# Patient Record
Sex: Female | Born: 1970
Health system: Southern US, Community
[De-identification: ages and names within clinical notes are randomized; demographics above are authoritative.]

## PROBLEM LIST (undated history)

## (undated) DIAGNOSIS — E039 Hypothyroidism, unspecified: Secondary | ICD-10-CM

## (undated) DIAGNOSIS — F32A Depression, unspecified: Secondary | ICD-10-CM

## (undated) DIAGNOSIS — Z87442 Personal history of urinary calculi: Secondary | ICD-10-CM

## (undated) DIAGNOSIS — E559 Vitamin D deficiency, unspecified: Secondary | ICD-10-CM

## (undated) DIAGNOSIS — R197 Diarrhea, unspecified: Secondary | ICD-10-CM

## (undated) DIAGNOSIS — N189 Chronic kidney disease, unspecified: Secondary | ICD-10-CM

## (undated) DIAGNOSIS — N200 Calculus of kidney: Secondary | ICD-10-CM

## (undated) DIAGNOSIS — E669 Obesity, unspecified: Secondary | ICD-10-CM

## (undated) DIAGNOSIS — N289 Disorder of kidney and ureter, unspecified: Secondary | ICD-10-CM

## (undated) DIAGNOSIS — I10 Essential (primary) hypertension: Secondary | ICD-10-CM

## (undated) DIAGNOSIS — F329 Major depressive disorder, single episode, unspecified: Secondary | ICD-10-CM

## (undated) DIAGNOSIS — F419 Anxiety disorder, unspecified: Secondary | ICD-10-CM

## (undated) HISTORY — PX: KIDNEY STONE SURGERY: SHX686

## (undated) HISTORY — DX: Diarrhea, unspecified: R19.7

## (undated) HISTORY — DX: Morbid (severe) obesity due to excess calories: E66.01

## (undated) HISTORY — PX: CHOLECYSTECTOMY: SHX55

## (undated) HISTORY — DX: Depression, unspecified: F32.A

## (undated) HISTORY — PX: OTHER SURGICAL HISTORY: SHX169

## (undated) HISTORY — DX: Chronic kidney disease, unspecified: N18.9

## (undated) HISTORY — DX: Anxiety disorder, unspecified: F41.9

---
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
## 2001-03-30 ENCOUNTER — Inpatient Hospital Stay (HOSPITAL_COMMUNITY): Admission: EM | Admit: 2001-03-30 | Discharge: 2001-04-02 | Payer: Self-pay | Admitting: Psychiatry

## 2001-04-05 ENCOUNTER — Other Ambulatory Visit (HOSPITAL_COMMUNITY): Admission: RE | Admit: 2001-04-05 | Discharge: 2001-04-06 | Payer: Self-pay | Admitting: *Deleted

## 2003-07-13 ENCOUNTER — Emergency Department (HOSPITAL_COMMUNITY): Admission: EM | Admit: 2003-07-13 | Discharge: 2003-07-13 | Payer: Self-pay | Admitting: Emergency Medicine

## 2004-05-20 ENCOUNTER — Inpatient Hospital Stay (HOSPITAL_COMMUNITY): Admission: RE | Admit: 2004-05-20 | Discharge: 2004-05-22 | Payer: Self-pay | Admitting: Urology

## 2004-05-30 ENCOUNTER — Ambulatory Visit (HOSPITAL_COMMUNITY): Admission: RE | Admit: 2004-05-30 | Discharge: 2004-05-30 | Payer: Self-pay | Admitting: Urology

## 2004-08-11 ENCOUNTER — Ambulatory Visit: Payer: Self-pay | Admitting: Psychiatry

## 2004-08-11 ENCOUNTER — Inpatient Hospital Stay (HOSPITAL_COMMUNITY): Admission: EM | Admit: 2004-08-11 | Discharge: 2004-08-12 | Payer: Self-pay | Admitting: Psychiatry

## 2009-01-13 ENCOUNTER — Emergency Department (HOSPITAL_COMMUNITY): Admission: EM | Admit: 2009-01-13 | Discharge: 2009-01-13 | Payer: Self-pay | Admitting: Emergency Medicine

## 2009-01-17 ENCOUNTER — Emergency Department (HOSPITAL_COMMUNITY): Admission: EM | Admit: 2009-01-17 | Discharge: 2009-01-17 | Payer: Self-pay | Admitting: Emergency Medicine

## 2009-02-01 ENCOUNTER — Ambulatory Visit (HOSPITAL_COMMUNITY): Admission: RE | Admit: 2009-02-01 | Discharge: 2009-02-01 | Payer: Self-pay | Admitting: Urology

## 2009-03-19 ENCOUNTER — Emergency Department (HOSPITAL_COMMUNITY): Admission: EM | Admit: 2009-03-19 | Discharge: 2009-03-19 | Payer: Self-pay | Admitting: Emergency Medicine

## 2009-08-06 ENCOUNTER — Encounter: Payer: Self-pay | Admitting: Gastroenterology

## 2009-08-23 ENCOUNTER — Ambulatory Visit (HOSPITAL_COMMUNITY): Admission: RE | Admit: 2009-08-23 | Discharge: 2009-08-23 | Payer: Self-pay | Admitting: Gastroenterology

## 2009-08-23 ENCOUNTER — Ambulatory Visit: Payer: Self-pay | Admitting: Gastroenterology

## 2010-06-18 ENCOUNTER — Ambulatory Visit (HOSPITAL_COMMUNITY)
Admission: RE | Admit: 2010-06-18 | Discharge: 2010-06-18 | Payer: Self-pay | Source: Home / Self Care | Attending: Urology | Admitting: Urology

## 2010-07-02 NOTE — Letter (Signed)
Summary: TRIAGE ORDER  TRIAGE ORDER   Imported By: Ave Filter 08/06/2009 10:57:06  _____________________________________________________________________  External Attachment:    Type:   Image     Comment:   External Document  Appended Document: TRIAGE ORDER ENDO W/ PROPOFOL DUE TO MULTIPLE PSYCH MEDS.  Appended Document: TRIAGE ORDER Instructions placed in the mail.Marland KitchenMarland KitchenOrder faxed to ENDO.

## 2010-08-25 LAB — HEPATIC FUNCTION PANEL
ALT: 14 U/L (ref 0–35)
AST: 16 U/L (ref 0–37)
Albumin: 3.9 g/dL (ref 3.5–5.2)
Alkaline Phosphatase: 67 U/L (ref 39–117)
Bilirubin, Direct: 0.1 mg/dL (ref 0.0–0.3)
Indirect Bilirubin: 0.1 mg/dL — ABNORMAL LOW (ref 0.3–0.9)
Total Bilirubin: 0.2 mg/dL — ABNORMAL LOW (ref 0.3–1.2)
Total Protein: 6.3 g/dL (ref 6.0–8.3)

## 2010-08-25 LAB — FECAL LACTOFERRIN, QUANT: Fecal Lactoferrin: NEGATIVE

## 2010-08-25 LAB — BASIC METABOLIC PANEL
BUN: 13 mg/dL (ref 6–23)
CO2: 22 mEq/L (ref 19–32)
Calcium: 9 mg/dL (ref 8.4–10.5)
Chloride: 112 mEq/L (ref 96–112)
Creatinine, Ser: 0.81 mg/dL (ref 0.4–1.2)
GFR calc Af Amer: >60 mL/min (ref >60)
GFR calc non Af Amer: >60 mL/min (ref >60)
Glucose, Bld: 81 mg/dL (ref 70–99)
Potassium: 3.8 mEq/L (ref 3.5–5.1)
Sodium: 139 mEq/L (ref 135–145)

## 2010-08-25 LAB — CLOSTRIDIUM DIFFICILE EIA: C difficile Toxins A+B, EIA: NEGATIVE

## 2010-08-25 LAB — GIARDIA/CRYPTOSPORIDIUM SCREEN(EIA)
Cryptosporidium Screen (EIA): NEGATIVE
Giardia Screen - EIA: NEGATIVE

## 2010-08-25 LAB — HCG, QUANTITATIVE, PREGNANCY: hCG, Beta Chain, Quant, S: <2 m[IU]/mL (ref <5)

## 2010-08-25 LAB — HEMOGLOBIN AND HEMATOCRIT, BLOOD
HCT: 37.2 % (ref 36.0–46.0)
Hemoglobin: 12.9 g/dL (ref 12.0–15.0)

## 2010-08-25 LAB — LIPASE, BLOOD: Lipase: 39 U/L (ref 11–59)

## 2010-09-07 LAB — URINALYSIS, ROUTINE W REFLEX MICROSCOPIC
Bilirubin Urine: NEGATIVE
Bilirubin Urine: NEGATIVE
Glucose, UA: NEGATIVE mg/dL
Ketones, ur: NEGATIVE mg/dL
Leukocytes, UA: NEGATIVE
Nitrite: NEGATIVE
Nitrite: NEGATIVE
Protein, ur: 100 mg/dL — AB
Specific Gravity, Urine: >1.03 — ABNORMAL HIGH (ref 1.005–1.030)
Specific Gravity, Urine: <1.005 — ABNORMAL LOW (ref 1.005–1.030)
Urobilinogen, UA: 0.2 mg/dL (ref 0.0–1.0)
Urobilinogen, UA: 0.2 mg/dL (ref 0.0–1.0)
pH: 5.5 (ref 5.0–8.0)

## 2010-09-07 LAB — CBC
HCT: 40.1 % (ref 36.0–46.0)
HCT: 39 % (ref 36.0–46.0)
Hemoglobin: 13.9 g/dL (ref 12.0–15.0)
Hemoglobin: 13.6 g/dL (ref 12.0–15.0)
MCHC: 34.7 g/dL (ref 30.0–36.0)
MCHC: 35 g/dL (ref 30.0–36.0)
MCV: 89.4 fL (ref 78.0–100.0)
MCV: 89.3 fL (ref 78.0–100.0)
Platelets: 283 10*3/uL (ref 150–400)
RBC: 4.49 MIL/uL (ref 3.87–5.11)
RBC: 4.37 MIL/uL (ref 3.87–5.11)
RDW: 13.7 % (ref 11.5–15.5)
RDW: 13.6 % (ref 11.5–15.5)
WBC: 8.4 10*3/uL (ref 4.0–10.5)

## 2010-09-07 LAB — BASIC METABOLIC PANEL
BUN: 8 mg/dL (ref 6–23)
CO2: 24 mEq/L (ref 19–32)
CO2: 21 mEq/L (ref 19–32)
Calcium: 9.8 mg/dL (ref 8.4–10.5)
Calcium: 8.9 mg/dL (ref 8.4–10.5)
Chloride: 109 mEq/L (ref 96–112)
Chloride: 111 mEq/L (ref 96–112)
Creatinine, Ser: 1.12 mg/dL (ref 0.4–1.2)
GFR calc Af Amer: >60 mL/min (ref >60)
GFR calc Af Amer: >60 mL/min (ref >60)
GFR calc non Af Amer: 55 mL/min — ABNORMAL LOW (ref >60)
Glucose, Bld: 90 mg/dL (ref 70–99)
Glucose, Bld: 107 mg/dL — ABNORMAL HIGH (ref 70–99)
Potassium: 3.8 mEq/L (ref 3.5–5.1)
Potassium: 2.8 mEq/L — ABNORMAL LOW (ref 3.5–5.1)
Sodium: 139 mEq/L (ref 135–145)
Sodium: 136 mEq/L (ref 135–145)

## 2010-09-07 LAB — COMPREHENSIVE METABOLIC PANEL
ALT: 17 U/L (ref 0–35)
AST: 19 U/L (ref 0–37)
Albumin: 4.1 g/dL (ref 3.5–5.2)
Alkaline Phosphatase: 90 U/L (ref 39–117)
BUN: 10 mg/dL (ref 6–23)
CO2: 26 mEq/L (ref 19–32)
Calcium: 9.2 mg/dL (ref 8.4–10.5)
Chloride: 111 mEq/L (ref 96–112)
Creatinine, Ser: 1.28 mg/dL — ABNORMAL HIGH (ref 0.4–1.2)
GFR calc Af Amer: 57 mL/min — ABNORMAL LOW (ref >60)
GFR calc non Af Amer: 47 mL/min — ABNORMAL LOW (ref >60)
Glucose, Bld: 79 mg/dL (ref 70–99)
Potassium: 2.9 mEq/L — ABNORMAL LOW (ref 3.5–5.1)
Sodium: 141 mEq/L (ref 135–145)
Total Bilirubin: 0.3 mg/dL (ref 0.3–1.2)
Total Protein: 7 g/dL (ref 6.0–8.3)

## 2010-09-07 LAB — URINE MICROSCOPIC-ADD ON

## 2010-09-07 LAB — DIFFERENTIAL
Basophils Absolute: 0 10*3/uL (ref 0.0–0.1)
Basophils Absolute: 0 10*3/uL (ref 0.0–0.1)
Basophils Relative: 1 % (ref 0–1)
Basophils Relative: 1 % (ref 0–1)
Eosinophils Absolute: 0.4 10*3/uL (ref 0.0–0.7)
Eosinophils Absolute: 0.1 10*3/uL (ref 0.0–0.7)
Eosinophils Relative: 4 % (ref 0–5)
Eosinophils Relative: 1 % (ref 0–5)
Lymphocytes Relative: 44 % (ref 12–46)
Lymphs Abs: 3.7 10*3/uL (ref 0.7–4.0)
Monocytes Absolute: 0.7 10*3/uL (ref 0.1–1.0)
Monocytes Absolute: 0.6 10*3/uL (ref 0.1–1.0)
Monocytes Relative: 9 % (ref 3–12)
Monocytes Relative: 8 % (ref 3–12)
Neutro Abs: 3.6 10*3/uL (ref 1.7–7.7)
Neutrophils Relative %: 43 % (ref 43–77)

## 2010-09-07 LAB — PREGNANCY, URINE: Preg Test, Ur: NEGATIVE

## 2010-09-07 LAB — HCG, QUANTITATIVE, PREGNANCY: hCG, Beta Chain, Quant, S: <2 m[IU]/mL (ref <5)

## 2010-09-07 LAB — LIPASE, BLOOD: Lipase: 22 U/L (ref 11–59)

## 2010-10-15 NOTE — Consult Note (Signed)
[name], [name]              ACCOUNT NO.:  [id number]   MEDICAL RECORD NO.:  [id number]          PATIENT TYPE:  AMB   LOCATION:  DAY                           FACILITY:  [facility]   PHYSICIAN:  Ky Barban, M.D.DATE OF BIRTH:  1970/08/16   DATE OF CONSULTATION:  DATE OF DISCHARGE:                                 CONSULTATION   CHIEF COMPLAINT:  Recurrent right renal colic.   HISTORY:  This 40 year old female is having recurrent right renal colic.  CT scan shows that she has at least 3 stones in the right ureter.  The  largest one is 6 mm.  They are at different level in the ureter causing  minimum obstruction also small bilateral renal calculi.  It should be  mentioned that she had right renal colic in 2005, at that time she had a  large stone in the right ureter, a double-J stent and ESL was done.  She  has not come back for followup in the past.  She has recurrent right  renal colic and CT scan shows multiple stones in the ureter, so I have  recommended cysto, right retrograde pyelogram, ureteroscopic stone  basket extraction, use of double-J stent.  I had a long discussion with  the patient and her mother.  I told them complication of this procedure.  1. I may not be able to remove the stone.  2. Possible ureteral perforation leading to open surgery.  She      understands, wants me to go ahead and proceed.   PAST MEDICAL HISTORY:  She had abortion in 2006, ESL in 2005 for right  ureteral calculus with double-J stent.  Also, suffers from depression,  takes medications, potassium, Prozac, and Topamax and Depo shorts for  birth control.   ALLERGIES:  Possible allergies for drugs SULFA drugs.   PERSONAL HISTORY:  Does not smoke or drink.   REVIEW OF SYSTEMS:  Unremarkable.   PHYSICAL EXAMINATION:  GENERAL:  Well-nourished, well-developed female  not in acute distress.  Blood pressure 143/91, temperature is normal.  CENTRAL NERVOUS SYSTEM:  Negative.  HEAD, NECK, EYES,  AND ENT:  Negative.  CHEST:  Symmetrical.  HEART:  Regular sinus rhythm.  No murmur.  ABDOMEN:  Soft, flat.  Liver, spleen, and kidneys are not palpable.  No  CVA tenderness.  PELVIC:  No adnexal mass or tenderness.   IMPRESSION:  Bilateral small renal calculi and right ureteral calculi.   PLAN:  Cystoscopy, right retrograde pyelogram, ureteroscopic stone  basket extraction, insertion of double-J stent under anesthesia as  outpatient.  Last year, she had left renal colic, went to [facility].  She passed a small calculus.      Ky Barban, M.D.  Electronically Signed     MIJ/MEDQ  D:  01/31/2009  T:  02/01/2009  Job:  [id number]

## 2010-10-15 NOTE — Op Note (Signed)
NAMENOEMIE, DEVIVO              ACCOUNT NO.:  [id number]   MEDICAL RECORD NO.:  [id number]          PATIENT TYPE:  AMB   LOCATION:  DAY                           FACILITY:  [facility]   PHYSICIAN:  Ky Barban, M.D.DATE OF BIRTH:  February 22, 1971   DATE OF PROCEDURE:  02/01/2009  DATE OF DISCHARGE:  02/01/2009                               OPERATIVE REPORT   PREOPERATIVE DIAGNOSIS:  Right ureteral calculi.   POSTOPERATIVE DIAGNOSIS:  Right ureteral calculi.   PROCEDURE:  Cystoscopy, right retrograde pyelogram, ureteroscopic stone  basket, holmium laser lithotripsy, insertion of double-J stent size 5-  French 24 cm, no strings attached.   ANESTHESIA:  General endotracheal.   PROCEDURE:  The patient under general endotracheal anesthesia in  lithotomy position.  After usual prep and drape, #25 cystoscope  introduced into the bladder, right ureteral orifice catheterized with a  wedge catheter.  Hypaque was injected.  I can see there is a filling  defect in the right ureterovesical junction that is the stone above that  ureter is moderately dilated, but I do not see any other obstruction or  any other filling defect.  As we have seen on CT scan, there were  additional small stones in the upper ureter, but I do not see on this  retrograde.  The guidewire was passed up into the renal pelvis and the  intramural ureter was dilated with #15 balloon.  Now ureteroscope over  the guidewire was introduced, went to the level of the stone.  Stone is  treated with holmium laser.  It was broken into smaller pieces.  Then  under direct vision, stone was engaged in the basket and removed  completely.  I reintroduced the ureteroscope over the guidewire went to  the upper ureter, I do not see any stone in the entire, rest of the  ureter looks fine, and so I decided to quit the procedure and cystoscope  was introduced over the guidewire and then a double-J stent size 5-  French 24 cm was introduced over  the guidewire.  It is positioned within  the renal pelvis and the bladder.  Guidewire was removed and nice loop  was obtained in the renal pelvis and the bladder.  All the instruments  were removed.  The patient left the operating room in satisfactory  condition.      Ky Barban, M.D.  Electronically Signed     MIJ/MEDQ  D:  02/01/2009  T:  02/02/2009  Job:  [id number]

## 2010-10-18 NOTE — Discharge Summary (Signed)
NAMEMERILEE, WIBLE              ACCOUNT NO.:  [id number]   MEDICAL RECORD NO.:  [id number]          PATIENT TYPE:  INP   LOCATION:  A312                          FACILITY:  [facility]   PHYSICIAN:  Ky Barban, M.D.DATE OF BIRTH:  10/07/1970   DATE OF ADMISSION:  05/20/2004  DATE OF DISCHARGE:  12/21/2005LH                                 DISCHARGE SUMMARY   A 40 year old female was seen in the office with severe pain in the right  flank. She was having this pain for about one week. Has been to the  emergency room at [facility]. CT scan shows 8-mm stone in the right  ureteropelvic junction causing hydronephrosis, perinephric stranding. She  also has a 4-mm stone in the left kidney without any obstruction. The  patient was having severe pain, so she was admitted in the hospital so that  I could insert double J stent and do ESL. She did pass a stone last year. No  other significant medical problem. She was taken to the operating room on  December 21. Underwent a right retrograde pyelogram, cystoscopy, and  insertion of double J stent. Postoperatively, she did fine. The next day,  she was treated with ESL. She was given the instructions that she has a  double J stent and in one week should contact the office. After looking at  the KUB, Dr. Rito Ehrlich will decided to take the stent out, because I am not  going to be in the office. She was also given a prescription for Percocet.   FINAL DIAGNOSIS:  Right renal calculus.   DISCHARGE CONDITION:  Improved.   DISCHARGE MEDICATIONS:  Percocet.      MIJ/MEDQ  D:  06/28/2004  T:  06/28/2004  Job:  [id number]

## 2010-10-18 NOTE — Discharge Summary (Signed)
[facility]  Patient:    MASSA, PE Visit Number: [id number] MRN: [id number]          Service Type: PSY Location: PIOP Attending Physician:  Denny Peon Dictated by:   Reymundo Poll Dub Mikes, M.D. Admit Date:  04/05/2001 Discharge Date: 04/06/2001                             Discharge Summary  CHIEF COMPLAINT AND PRESENT ILLNESS:  This was the first admission to [facility] for this 40 year old female, who reported to her primary care physician for treatment of depression and was referred as she had been increasingly more depressed for the past three weeks, initial insomnia, overall increasing sleep, sleeping 8-10 hours a night, increased anhedonia, weight loss, decreased concentration, unable to make decisions, racing suicidal thoughts of just wishing that she would not wake up.  Denies any active suicidal ideation.  Has some history of impulsive spending but finances are actually in good shape.  No history of mania.  PAST PSYCHIATRIC HISTORY:  Had never seen on an outpatient or inpatient basis. Has been treated with Prozac and Paxil by her primary physician.  ALCOHOL/DRUG HISTORY:  Occasional drink.  No history of abuse.  MEDICAL HISTORY:  Noncontributory.  MEDICATIONS:  She takes Zoloft 50 mg per day for two years, Topamax 25 mg twice a day, and Alesse oral contraceptive pill.  PHYSICAL EXAMINATION:  Performed and failed to show an active findings.  MENTAL STATUS EXAMINATION:  Upon admission revealed a casually dressed female. Alert, cooperative, constricted affect.  Speech was normal, relevant and spontaneous.  Mood depression.  Affect depression.  Thought process was logical, goal directed, no evidence of suicidal ideation.  No homicidal ideation.  No auditory or visual hallucinations.  Brightened mood with further discussion.  Mild sense of paranoia, feeling that people are watching her and talking about her.  Appears  to be transient.  ADMISSION DIAGNOSES: Axis I:    Major depression, recurrent. Axis II:   No diagnosis. Axis III:  Hypokalemia. Axis IV:   Moderate. Axis V:    Global Assessment of Functioning upon admission 40; highest Global            Assessment of Functioning in the last year 75.  HOSPITAL COURSE:  She was admitted and started intensive individual and group psychotherapy.  She was given some Seroquel at bedtime.  She was kept on the Zoloft, which was increased to 100 mg and she was started on Wellbutrin slowly at 150 mg in the morning.  As the hospitalization progressed, she felt better. She reported no side effects to the combination of Zoloft and Wellbutrin.  She was encouraged and motivated.  She denied any further suicidal or homicidal ideation.  She was willing to come to IOP for further stabilization.  A family session held with her husband and her parents was very positive.  They were very supportive of her, for which she was discharged to outpatient follow-up.  DISCHARGE DIAGNOSES: Axis I:    Major depression, recurrent. Axis II:   No diagnosis. Axis III:  Hypokalemia. Axis IV:   Moderate. Axis V:    Global Assessment of Functioning upon discharge 60.  DISCHARGE MEDICATIONS: 1. Zoloft 100 mg per day. 2. Wellbutrin SR 150 mg in the morning x 7 days, then increase to 1 twice a    day. 3. Seroquel 25 mg at bedtime. 4. Trazodone 50 mg at  bedtime for sleep.  FOLLOW-UP:  [facility] and then follow up with Dr. Milford Cage at the [facility]. Dictated by:   Reymundo Poll Dub Mikes, M.D. Attending Physician:  Denny Peon DD:  05/09/01 TD:  05/10/01 Job: [id number]

## 2010-10-18 NOTE — H&P (Signed)
NAMESAFIYA, [name]              ACCOUNT NO.:  [id number]   MEDICAL RECORD NO.:  [id number]          PATIENT TYPE:  AMB   LOCATION:  DAY                           FACILITY:  [facility]   PHYSICIAN:  Ky Barban, M.D.DATE OF BIRTH:  [birth year]   DATE OF ADMISSION:  DATE OF DISCHARGE:  LH                                HISTORY & PHYSICAL   CHIEF COMPLAINT:  Recurrent right renal colic.   HISTORY OF PRESENT ILLNESS:  A 40 year old female was seen in the office  today with severe pain in the right flank.  She told me that she is hurting  like this for the last one week.  She went to the emergency room on Friday  in [place] and had a CT scan that showed that it is 8 mm stone in the right  ureteropelvic junction, causing hydronephrosis with __________ stranding.  She also had several smaller stones in the lower pole calix on that side and  she has a 4 mm stone in the left kidney without any obstruction.  I have  advised the patient to undergo insertion of Double J stent followed with  ESL.  I have gone over the procedure, its limitations, and complications in  detail.  She understands.  Then later today she started to have more pain,  nausea, vomiting, so I decided to keep her in the hospital under observation  and tomorrow we will put a Double J stent and if she is doing okay, I will  discharge her after the stent placement and the next day she is scheduled to  have ESL for renal calculus.   PAST MEDICAL HISTORY:  In October of this year she was in the hospital at  [facility] with left renal colic.  She apparently passed a stone and has never  been seen in the office since.  She told me she has passed a stone in the  past and has been having this pain in the right flank on and off for awhile.  No other significant medical problems like diabetes or hypertension.   ALLERGIES:  Positive allergic history to SULFA DRUGS.   PERSONAL HISTORY:  She does not smoke or drink.   REVIEW OF  SYSTEMS:  Unremarkable.   PHYSICAL EXAMINATION:  VITAL SIGNS:  Blood pressure 133/86, temperature  97.8.  CENTRAL NERVOUS SYSTEM:  No gross neurological deficit.  HEENT/NECK:  Negative.  CHEST:  Symmetrical.  HEART:  Regular sinus rhythm.  ABDOMEN:  Soft, flat.  Liver, spleen, kidneys are not palpable.  BACK:  1+ right CVA tenderness.  PELVIC:  Exam deferred.  EXTREMITIES:  Normal.   IMPRESSION:  Right renal calculus with hydronephrosis.   PLAN:  Urine culture, IV fluids,    __________ analgesia, and insertion of  Double J stent on the right side tomorrow.      MIJ/MEDQ  D:  05/20/2004  T:  05/20/2004  Job:  [id number]   cc:   Fara Chute  [address] [place]  [place] [address]  Fax: [phone]

## 2010-10-18 NOTE — H&P (Signed)
[facility]  Patient:    [name], [name] Visit Number: [id number] MRN: [id number]          Service Type: PSY Location: 300 0301 02 Attending Physician:  Rachael Fee Dictated by:   Young Berry Scott, N.P. Admit Date:  03/30/2001                     Psychiatric Admission Assessment  DATE OF ASSESSMENT:  March 31, 2001 at 9:30 a.m.  IDENTIFYING INFORMATION:  This is a 40 year old married Caucasian female, who is a voluntary admission.  HISTORY OF PRESENT ILLNESS:  This patient reported to her primary care physician yesterday for treatment of depression symptoms and was referred by her primary care physician.  She reports a history of depression for the past four years with some seasonal increase in symptoms, usually in the fall of the year.  The patient reports now that she has had some increase in depression for the past three weeks with initial insomnia, overall increase in sleep, sleeping 8-10 hours a night, increasing anhedonia, weight loss of 10-15 pounds in the past month, decreased concentration and inability to make decisions. The patient states that she has had some transient suicidal thoughts of just wishing that she would not wake up but she denies any active suicidal planning or intent.  The patient does have some history of impulsive spending but finances are actually in good shape and no real history of mania.  The patient has no history of prior suicide attempts.  No homicidal ideation is evident. She denies any homicidal thoughts.  Denies any auditory or visual hallucinations.  She does describe some vague symptoms of paranoia and she feels that people are talking about her at work and are watching her.  PAST PSYCHIATRIC HISTORY:  None.  The patient has never been seen on an outpatient or inpatient basis.  This is her first admission to [facility].  The patient has been treated with Prozac and Paxil in the past by  her primary care physician with poor results.  SOCIAL HISTORY:  The patient graduated from high school.  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  She has been married once for the past four years. She has no children.  She does state that she has had some spending problems with some debt history but reports her finances now are in the best shape they have ever been in.  She cites no particular social stressors for her situation.  FAMILY HISTORY:  The patient denies any history of mental illness or substance abuse.  ALCOHOL/DRUG HISTORY:  The patient has an occasional drink.  Denies any history of drug use.  She is a nonsmoker.  MEDICAL HISTORY:  The patient is followed by Rogue Jury, a physicians assistant at [facility] in [place], [place].  Medical problems are none.  The patient is healthy.  The patient has no prior history of surgeries. The patient has two prior hospitalizations, one for a possible allergic reaction to sulfa eye drops in the summer of 2002 and one hospitalization in 1992 for renal calculi.  MEDICATIONS:  Zoloft 75 mg a day, which she has been taking now for approximately two years, Topamax 25 mg b.i.d. (patient has taken this for three weeks) and Alesse oral contraceptive pills.  ALLERGIES:  SULFA drugs.  POSITIVE PHYSICAL FINDINGS:  The patients PE is currently pending.  LABORATORY DATA:  CBC is currently within normal limits.  Metabolic panel shows a creatinine of 1.0 and a BUN of 11.  It is noted that her potassium is 2.6.  Her other electrolytes are within normal limits.  Her pregnancy test is pending as is a urine drug screen and a routine UA and a thyroid panel.  MENTAL STATUS EXAMINATION:  This is a casually and neatly dressed female who is fully alert and cooperative with a constricted affect.  Speech is normal and relevant and is spontaneous.  Mood is  mildly depressed.  Thought process is logical and goal directed.  She has no evidence of suicidal ideation at this time.  No homicidal ideation.  No auditory or visual hallucinations.  She does brighten a bit in mood with further discussion.  She does have some mild vague sense of paranoia, feeling that people are watching her and talking about her and this appears to be transient over the past couple of weeks. Cognitively, she is intact.  DIAGNOSES: Axis I:    Major depression, recurrent, moderate. Axis II:   Deferred. Axis III:  Hypokalemia. Axis IV:   Deferred. Axis V:    Current 45; past year 78.  PLAN:  Voluntarily admit the patient to stabilize her mood and the patient promises safety on the unit.  We will discontinue her Topamax at this time and have elected to increase her Zoloft to 100 mg a day.  We will start her on Seroquel 25 mg at h.s. since she has had some vague feelings of paranoia and is having some difficulty with insomnia and we will continue to allow her to have trazodone also 50 mg on a p.r.n. basis.  We will get some corroboration from her husband and her mother, who had some concerns about her safety, and we will consider referring her to Milford Cage, M.D. for psychiatric follow-up after discharge up in the [facility] and she is amenable to this.  ESTIMATED LENGTH OF STAY:  Two to four days. Dictated by:   Young Berry Scott, N.P. Attending Physician:  Rachael Fee DD:  03/31/01 TD:  03/31/01 Job: [id number]

## 2010-10-18 NOTE — Op Note (Signed)
NAMEKRYSTINE, [name]              ACCOUNT NO.:  [id number]   MEDICAL RECORD NO.:  [id number]          PATIENT TYPE:  AMB   LOCATION:  DAY                           FACILITY:  [facility]   PHYSICIAN:  Ky Barban, M.D.DATE OF BIRTH:  1970/10/27   DATE OF PROCEDURE:  05/22/2004  DATE OF DISCHARGE:                                 OPERATIVE REPORT   PREOPERATIVE DIAGNOSIS:  Right ureteral calculus and hydronephrosis.   POSTOPERATIVE DIAGNOSIS:  Right ureteral calculus and hydronephrosis.   PROCEDURES:  1.  Cystoscopy  2.  Insertion of double J stent on right side, size 5 [place], 24 cm.   ANESTHESIA:  General.   PROCEDURE:  The patient is given general endotracheal anesthesia, placed in  lithotomy position after the usual prep and drape.  A #25 cystoscope is  introduced into the bladder.  It is inspected, looks normal.  Right ureteral  orifice was catheterized with a guidewire and under fluoroscopic control it  went up into the renal pelvis and I can see the green guidewire curling up  in that area, and the stone is seen in the upper ureter.  Over the guidewire  a 5 [place], 24 cm double J stent is positioned within the renal pelvis and  the bladder.  The guidewire is removed.  Nice coil of the double J stent is  seen in the renal pelvis and the bladder.  All the instruments are removed.  The patient left the operating room in satisfactory condition.      MIJ/MEDQ  D:  05/21/2004  T:  05/22/2004  Job:  [id number]

## 2010-10-18 NOTE — Discharge Summary (Signed)
[name], [name] NO.:  [id number]   MEDICAL RECORD NO.:  [id number]          PATIENT TYPE:  IPS   LOCATION:  0304                          FACILITY:  [facility]   PHYSICIAN:  Geoffery Lyons, M.D.      DATE OF BIRTH:  June 26, 1970   DATE OF ADMISSION:  08/11/2004  DATE OF DISCHARGE:  08/12/2004                                 DISCHARGE SUMMARY   CHIEF COMPLAINT AND PRESENT ILLNESS:  This was the second admission to [facility] for this 40 year old married white female.  Her  husband took out petition papers.  He felt she was suicidal.  She was  depressed status post an abortion.  Seen at [facility] by Dr. Berton Mount.  She  was evaluated and findings were compatible with her having had an abortion  around two weeks prior to this admission.  She was started on Effexor a  couple of weeks prior to this admission.  She decided to get an abortion  because she was not getting along with the husband.  They had financial  issues.  She became upset.  She pretended having some pills in her mouth but  did not take them.   PAST PSYCHIATRIC HISTORY:  One prior admission in April 05, 2001 to  April 06, 2001.  She was, at that time, taking Zoloft 50 mg per day and  Topamax 25 mg twice a day.  She has been on Prozac and Paxil.  She had  followed up with Dr. Donell Beers in February of 2005.   ALCOHOL/DRUG HISTORY:  Denies the use or abuse of any substances.   MEDICAL HISTORY:  Kidney stones.   MEDICATIONS:  Effexor XR 75 mg daily.   PHYSICAL EXAMINATION:  Performed and failed to show any acute findings.   LABORATORY DATA:  CBC within normal limits.  Drug screen negative for  substances of abuse.  Blood chemistry within normal limits.   MENTAL STATUS EXAM:  Alert, cooperative female.  Speech was normal rate,  rhythm and tone.  Mood was irritable.  Affect was congruent.  She said she  just wanted to sleep.  Thought processes were clear, rational and goal-  oriented.   Claimed that she would like some help.  Denied any suicidal or  homicidal ideation.  There was no evidence of delusions.  Cognition was well-  preserved.   ADMISSION DIAGNOSES:   AXIS I:  Major depression, recurrent.   AXIS II:  No diagnosis.   AXIS III:  1.  Status post renal calculi.  2.  Status post abortion.   AXIS IV:  Moderate.   AXIS V:  Global Assessment of Functioning upon admission 25-30; highest  Global Assessment of Functioning in the last year 60-65.   HOSPITAL COURSE:  She was admitted and started in individual and group  psychotherapy.  She was given Ambien for sleep.  She was switched to  Cymbalta 30 mg per day and she was also given Lamictal 25 mg daily.  She was  given Vistaril 50 mg.  She endorsed multiple losses.  Endorsed increased  depression.  Endorsed migraines were not going away.  Difficult time coping  from day to day.  Recently went through an abortion.  There was issues of  loss of relationship, conflict with the husband, acute pain.  She was  started on Cymbalta.  On the same day, she was informed that she did not  have any psychiatric benefits in her insurance and she had been sponsored by  [facility] for two days but not for [employer].  They authorized sponsorship for [facility].  Due to the same, she  was transferred to [facility] for further stabilization.   DISCHARGE DIAGNOSES:   AXIS I:  Major depression, recurrent, versus bipolar, depressed.   AXIS II:  No diagnosis.   AXIS III:  1.  Status post renal calculi.  2.  Status post abortion.   AXIS IV:  Moderate.   AXIS V:  Global Assessment of Functioning upon discharge 45.   DISPOSITION:  Discharged to [facility] for further stabilization.      IL/MEDQ  D:  09/04/2004  T:  09/04/2004  Job:  [id number]

## 2010-10-18 NOTE — H&P (Signed)
[name], PINARD NO.:  [id number]   MEDICAL RECORD NO.:  [id number]          PATIENT TYPE:  IPS   LOCATION:  0306                          FACILITY:  [facility]   PHYSICIAN:  Jeanice Lim, M.D. DATE OF BIRTH:  11/06/1970   DATE OF ADMISSION:  08/11/2004  DATE OF DISCHARGE:                         PSYCHIATRIC ADMISSION ASSESSMENT   IDENTIFYING INFORMATION:  This is an involuntary admission to the services  of Dr. Aleatha Borer.   HISTORY OF PRESENT ILLNESS:  This is a 40 year old married white female.  Apparently her husband took out the petition papers.  He felt that she was  suicidal.  She was depressed.  She was status post an abortion.  She was  seen at [facility] by Dr. Kate Sable.  Dr. Kate Sable checked and  apparently she did have an abortion about 2 weeks ago.  She had blood in her  urine.  She is known to have kidney stones.  They checked her HCG, and that  was coming down.  It was consistent with having had the abortion 2 weeks  ago.  She stated that she had been started on Effexor a couple of weeks ago.  I also found out that she was pregnant and had an abortion in the last week  or so.  The patient states that the reason she decided to have an abortion  is that she is not getting along with her husband at the moment, and they  are having financial issues.  Last night she became upset.  She put 10  Unisom pills in her mouth but did not take them.  Then she was taken to the  hospital at the request of her husband.   PAST PSYCHIATRIC HISTORY:  She states that she has had one prior admission  that was here; the dates on that are 11/04 to 04/06/01.  At that time she  was felt to have major depression, recurrent.  She was at that time taking  Zoloft 50 mg a day for 2 years and Topamax 25 mg twice a day.  She also  stated that she had been treated in the past with Prozac and Paxil by her  primary physician.  She was discharged on Zoloft 100 mg a day  and Wellbutrin  150 mg a day to be increased to twice a day, and Seroquel 25 mg at bedtime  back in 2002.  She stated that she had follow up with Dr. Donell Beers until  February 2005.  She was married this past May and has been on no  medications.  A mood disorder questionnaire was administered; it is  suggestive for underlying mood fluctuations and toward that end we will  address her medications in light of her past history and her presentation  today.   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  This is her second marriage.  She has no children.  She states she  was [redacted] weeks pregnant at the time that she terminated the pregnancy.   FAMILY HISTORY:  She states her mom has mood  problems.   ALCOHOL OR DRUG HISTORY:  She denies.   PRIMARY CARE Ammie Warrick:  [facility] in [place].   PAST MEDICAL HISTORY:  This past December she was admitted for kidney  stones.  She underwent lithotripsy and had a stent.  The date of that  admission was 12/19 to 05/22/2004.  She recently restarted Effexor 75 mg  p.o. daily.   ALLERGIES:  She states she has allergies to SULFA.   PHYSICAL EXAMINATION:  Showed she had some blood in her urine.   MENTAL STATUS EXAM:  She is alert and oriented.  Her speech is a normal  rate, rhythm and tone.  Her mood is irritable.  Her affect is congruent.  She states she just wants to go to sleep.  Her thought processes are clear,  rational and goal-oriented.  She would like help.  Judgment and insight were  intact.  Concentration and memory are intact.  Intelligence is at least  average.  She ill not answer whether she is suicidal or homicidal at this  time.  She denies ever having had auditory or visual hallucinations.   ADMISSION DIAGNOSES:   AXIS I:  1.  Depressive disorder, recurrent, severe.  2.  Rule out bipolar depressed.   AXIS II:  Deferred.   AXIS III:  1.  Status post renal calculi, lithotripsy and  stent placement.  2.  Recent abortion.   AXIS IV:  Severe, marital conflict and financial issues.   AXIS V:  30.   PLAN:  Admit for safety and stabilization, to restart psychotropics and what  we can do is start Cymbalta.  The patient states that she has had some  response to Effexor in the past.  Cymbalta will reach therapeutic levels of  norepinephrine and serotonin with 1 dose as well as opposed to having to  build up the dose of Effexor.  We will also start some Lamictal 25 mg at  h.s.      MD/MEDQ  D:  08/11/2004  T:  08/11/2004  Job:  [id number]

## 2010-10-18 NOTE — H&P (Signed)
[facility]  Patient:    [name], [name] Visit Number: [id number] MRN: [id number]          Service Type: PSY Location: 300 0301 02 Attending Physician:  Rachael Fee Dictated by:   Young Berry Scott, N.P. Admit Date:  03/30/2001                           History and Physical  DATE OF EXAMINATION: March 31, 2001 at approximately 2:15 p.m.  REVIEW OF SYSTEMS:  Essentially unremarkable.  The patient has no somatic complaints.  She has a past medical history of renal calculi.  She denies any prior history of seizures, denies any history of hypertension or cardiovascular problems.  No history of asthma.  The patient is a nonsmoker. Preventative care is up-to-date.  PHYSICAL EXAMINATION:  GENERAL:  The is a well-nourished, well-developed female who appears to be her stated age.  VITAL SIGNS:  Within normal limits today.  SKIN:  Pale in tone, no rashes, no distinguishing marking.  HEAD:  Hair is reddish-blonde and clipped short.  Hair is distribution is normal for age and sex.   Head is normocephalic, held midline.  EENT:  PERRLA.  Sclerae and conjunctivae are clear.  Fundi: Not visualized. EACs are patent.  No rhinorrhea.  Oropharynx is noninjected. NECK:  Supple without thyromegaly.  CARDIOVASCULAR:  S1 and S2 heard, no clicks, murmurs, or gallops.  Peripheral pulses are 2+/5.  Extremities are pink and warm.  No peripheral edema.  CHEST:  Symmetrical.  BREAST:  Deferred.  LUNGS:  Clear to auscultation.  ABDOMEN:  Flat, soft and quiet with no masses or tenderness.  GENITALIA:  Deferred.  MUSCULOSKELETAL:  Gait is normal.  Spine is straight.  Posture is upright. Strength is 5/5 throughout.  Independent in ADLs.  NEUROLOGIC:  Extraocular movements are intact with one beat of nystagmus in all directions.  Cranial nerves II-XII are intact.  Sensory is grossly intact. Motor movements are smooth without tremor.  Deep tendon reflexes are  2+/5 and are brisk.  Cerebellar function: Intact to heel-to-shin maneuvers.  Romberg: Without findings.  Babinski is equivocal. Dictated by:   Young Berry. Scott, N.P. Attending Physician:  Rachael Fee DD:  03/31/01 TD:  03/31/01 Job: [id number]

## 2011-06-09 DIAGNOSIS — J111 Influenza due to unidentified influenza virus with other respiratory manifestations: Secondary | ICD-10-CM | POA: Diagnosis not present

## 2011-07-27 DIAGNOSIS — S61209A Unspecified open wound of unspecified finger without damage to nail, initial encounter: Secondary | ICD-10-CM | POA: Diagnosis not present

## 2011-07-27 DIAGNOSIS — Z79899 Other long term (current) drug therapy: Secondary | ICD-10-CM | POA: Diagnosis not present

## 2011-07-27 DIAGNOSIS — F319 Bipolar disorder, unspecified: Secondary | ICD-10-CM | POA: Diagnosis not present

## 2011-10-22 DIAGNOSIS — F319 Bipolar disorder, unspecified: Secondary | ICD-10-CM | POA: Diagnosis not present

## 2011-10-23 DIAGNOSIS — Z3049 Encounter for surveillance of other contraceptives: Secondary | ICD-10-CM | POA: Diagnosis not present

## 2011-11-01 DIAGNOSIS — N209 Urinary calculus, unspecified: Secondary | ICD-10-CM | POA: Diagnosis not present

## 2011-11-01 DIAGNOSIS — N390 Urinary tract infection, site not specified: Secondary | ICD-10-CM | POA: Diagnosis not present

## 2011-11-01 DIAGNOSIS — R109 Unspecified abdominal pain: Secondary | ICD-10-CM | POA: Diagnosis not present

## 2011-11-01 DIAGNOSIS — R112 Nausea with vomiting, unspecified: Secondary | ICD-10-CM | POA: Diagnosis not present

## 2011-11-01 DIAGNOSIS — Z87442 Personal history of urinary calculi: Secondary | ICD-10-CM | POA: Diagnosis not present

## 2011-11-01 DIAGNOSIS — Z87440 Personal history of urinary (tract) infections: Secondary | ICD-10-CM | POA: Diagnosis not present

## 2011-11-01 DIAGNOSIS — M549 Dorsalgia, unspecified: Secondary | ICD-10-CM | POA: Diagnosis not present

## 2011-11-01 DIAGNOSIS — Z79899 Other long term (current) drug therapy: Secondary | ICD-10-CM | POA: Diagnosis not present

## 2011-11-06 DIAGNOSIS — N390 Urinary tract infection, site not specified: Secondary | ICD-10-CM | POA: Diagnosis not present

## 2011-11-06 DIAGNOSIS — Z79899 Other long term (current) drug therapy: Secondary | ICD-10-CM | POA: Diagnosis not present

## 2011-11-06 DIAGNOSIS — Z87442 Personal history of urinary calculi: Secondary | ICD-10-CM | POA: Diagnosis not present

## 2011-11-06 DIAGNOSIS — Z87440 Personal history of urinary (tract) infections: Secondary | ICD-10-CM | POA: Diagnosis not present

## 2011-11-06 DIAGNOSIS — N200 Calculus of kidney: Secondary | ICD-10-CM | POA: Diagnosis not present

## 2011-11-06 DIAGNOSIS — J069 Acute upper respiratory infection, unspecified: Secondary | ICD-10-CM | POA: Diagnosis not present

## 2011-11-19 DIAGNOSIS — N23 Unspecified renal colic: Secondary | ICD-10-CM | POA: Diagnosis not present

## 2011-11-19 DIAGNOSIS — N201 Calculus of ureter: Secondary | ICD-10-CM | POA: Diagnosis not present

## 2011-11-19 DIAGNOSIS — N269 Renal sclerosis, unspecified: Secondary | ICD-10-CM | POA: Diagnosis not present

## 2011-11-19 DIAGNOSIS — N200 Calculus of kidney: Secondary | ICD-10-CM | POA: Diagnosis not present

## 2011-11-19 DIAGNOSIS — N1330 Unspecified hydronephrosis: Secondary | ICD-10-CM | POA: Diagnosis not present

## 2011-11-20 DIAGNOSIS — N23 Unspecified renal colic: Secondary | ICD-10-CM | POA: Diagnosis not present

## 2011-11-20 DIAGNOSIS — N200 Calculus of kidney: Secondary | ICD-10-CM | POA: Diagnosis not present

## 2011-11-20 DIAGNOSIS — N201 Calculus of ureter: Secondary | ICD-10-CM | POA: Diagnosis not present

## 2011-11-21 DIAGNOSIS — F319 Bipolar disorder, unspecified: Secondary | ICD-10-CM | POA: Diagnosis not present

## 2011-11-21 DIAGNOSIS — Z87442 Personal history of urinary calculi: Secondary | ICD-10-CM | POA: Diagnosis not present

## 2011-11-21 DIAGNOSIS — Z79899 Other long term (current) drug therapy: Secondary | ICD-10-CM | POA: Diagnosis not present

## 2011-11-21 DIAGNOSIS — N1330 Unspecified hydronephrosis: Secondary | ICD-10-CM | POA: Diagnosis not present

## 2011-11-21 DIAGNOSIS — N201 Calculus of ureter: Secondary | ICD-10-CM | POA: Diagnosis not present

## 2011-11-21 DIAGNOSIS — E871 Hypo-osmolality and hyponatremia: Secondary | ICD-10-CM | POA: Diagnosis not present

## 2011-11-21 DIAGNOSIS — E876 Hypokalemia: Secondary | ICD-10-CM | POA: Diagnosis not present

## 2011-11-21 DIAGNOSIS — N23 Unspecified renal colic: Secondary | ICD-10-CM | POA: Diagnosis not present

## 2011-11-21 DIAGNOSIS — D649 Anemia, unspecified: Secondary | ICD-10-CM | POA: Diagnosis not present

## 2011-11-21 DIAGNOSIS — N200 Calculus of kidney: Secondary | ICD-10-CM | POA: Diagnosis not present

## 2011-12-12 DIAGNOSIS — K589 Irritable bowel syndrome without diarrhea: Secondary | ICD-10-CM | POA: Diagnosis not present

## 2011-12-12 DIAGNOSIS — R634 Abnormal weight loss: Secondary | ICD-10-CM | POA: Diagnosis not present

## 2012-01-16 DIAGNOSIS — R197 Diarrhea, unspecified: Secondary | ICD-10-CM | POA: Diagnosis not present

## 2012-01-16 DIAGNOSIS — R634 Abnormal weight loss: Secondary | ICD-10-CM | POA: Diagnosis not present

## 2012-01-16 DIAGNOSIS — K589 Irritable bowel syndrome without diarrhea: Secondary | ICD-10-CM | POA: Diagnosis not present

## 2012-01-26 DIAGNOSIS — Z3049 Encounter for surveillance of other contraceptives: Secondary | ICD-10-CM | POA: Diagnosis not present

## 2012-02-09 DIAGNOSIS — H43399 Other vitreous opacities, unspecified eye: Secondary | ICD-10-CM | POA: Diagnosis not present

## 2012-02-09 DIAGNOSIS — H53129 Transient visual loss, unspecified eye: Secondary | ICD-10-CM | POA: Diagnosis not present

## 2012-02-19 DIAGNOSIS — Z79899 Other long term (current) drug therapy: Secondary | ICD-10-CM | POA: Diagnosis not present

## 2012-02-19 DIAGNOSIS — K589 Irritable bowel syndrome without diarrhea: Secondary | ICD-10-CM | POA: Diagnosis not present

## 2012-04-09 DIAGNOSIS — R300 Dysuria: Secondary | ICD-10-CM | POA: Diagnosis not present

## 2012-04-09 DIAGNOSIS — Z01419 Encounter for gynecological examination (general) (routine) without abnormal findings: Secondary | ICD-10-CM | POA: Diagnosis not present

## 2012-04-09 DIAGNOSIS — Z124 Encounter for screening for malignant neoplasm of cervix: Secondary | ICD-10-CM | POA: Diagnosis not present

## 2012-04-15 DIAGNOSIS — Z3049 Encounter for surveillance of other contraceptives: Secondary | ICD-10-CM | POA: Diagnosis not present

## 2012-04-20 DIAGNOSIS — F319 Bipolar disorder, unspecified: Secondary | ICD-10-CM | POA: Diagnosis not present

## 2012-05-14 DIAGNOSIS — Z1231 Encounter for screening mammogram for malignant neoplasm of breast: Secondary | ICD-10-CM | POA: Diagnosis not present

## 2012-06-30 DIAGNOSIS — J019 Acute sinusitis, unspecified: Secondary | ICD-10-CM | POA: Diagnosis not present

## 2012-06-30 DIAGNOSIS — R5381 Other malaise: Secondary | ICD-10-CM | POA: Diagnosis not present

## 2012-07-08 DIAGNOSIS — Z3049 Encounter for surveillance of other contraceptives: Secondary | ICD-10-CM | POA: Diagnosis not present

## 2012-07-13 DIAGNOSIS — F319 Bipolar disorder, unspecified: Secondary | ICD-10-CM | POA: Diagnosis not present

## 2012-09-29 DIAGNOSIS — R5381 Other malaise: Secondary | ICD-10-CM | POA: Diagnosis not present

## 2012-09-29 DIAGNOSIS — Z3049 Encounter for surveillance of other contraceptives: Secondary | ICD-10-CM | POA: Diagnosis not present

## 2012-10-11 DIAGNOSIS — F319 Bipolar disorder, unspecified: Secondary | ICD-10-CM | POA: Diagnosis not present

## 2012-12-09 DIAGNOSIS — R5381 Other malaise: Secondary | ICD-10-CM | POA: Diagnosis not present

## 2012-12-09 DIAGNOSIS — R197 Diarrhea, unspecified: Secondary | ICD-10-CM | POA: Diagnosis not present

## 2012-12-09 DIAGNOSIS — R209 Unspecified disturbances of skin sensation: Secondary | ICD-10-CM | POA: Diagnosis not present

## 2012-12-09 DIAGNOSIS — R300 Dysuria: Secondary | ICD-10-CM | POA: Diagnosis not present

## 2012-12-09 DIAGNOSIS — Z87891 Personal history of nicotine dependence: Secondary | ICD-10-CM | POA: Diagnosis not present

## 2012-12-09 DIAGNOSIS — Z79899 Other long term (current) drug therapy: Secondary | ICD-10-CM | POA: Diagnosis not present

## 2012-12-09 DIAGNOSIS — Z87442 Personal history of urinary calculi: Secondary | ICD-10-CM | POA: Diagnosis not present

## 2012-12-09 DIAGNOSIS — R339 Retention of urine, unspecified: Secondary | ICD-10-CM | POA: Diagnosis not present

## 2012-12-09 DIAGNOSIS — R109 Unspecified abdominal pain: Secondary | ICD-10-CM | POA: Diagnosis not present

## 2012-12-09 DIAGNOSIS — S40269A Insect bite (nonvenomous) of unspecified shoulder, initial encounter: Secondary | ICD-10-CM | POA: Diagnosis not present

## 2013-02-08 DIAGNOSIS — F319 Bipolar disorder, unspecified: Secondary | ICD-10-CM | POA: Diagnosis not present

## 2013-02-08 DIAGNOSIS — N390 Urinary tract infection, site not specified: Secondary | ICD-10-CM | POA: Diagnosis not present

## 2013-02-08 DIAGNOSIS — N200 Calculus of kidney: Secondary | ICD-10-CM | POA: Diagnosis not present

## 2013-02-08 DIAGNOSIS — R109 Unspecified abdominal pain: Secondary | ICD-10-CM | POA: Diagnosis not present

## 2013-02-09 DIAGNOSIS — R109 Unspecified abdominal pain: Secondary | ICD-10-CM | POA: Diagnosis not present

## 2013-02-11 DIAGNOSIS — R300 Dysuria: Secondary | ICD-10-CM | POA: Diagnosis not present

## 2013-02-11 DIAGNOSIS — M545 Low back pain, unspecified: Secondary | ICD-10-CM | POA: Diagnosis not present

## 2013-03-03 DIAGNOSIS — M9981 Other biomechanical lesions of cervical region: Secondary | ICD-10-CM | POA: Diagnosis not present

## 2013-03-03 DIAGNOSIS — M4712 Other spondylosis with myelopathy, cervical region: Secondary | ICD-10-CM | POA: Diagnosis not present

## 2013-03-03 DIAGNOSIS — M999 Biomechanical lesion, unspecified: Secondary | ICD-10-CM | POA: Diagnosis not present

## 2013-03-03 DIAGNOSIS — M546 Pain in thoracic spine: Secondary | ICD-10-CM | POA: Diagnosis not present

## 2013-03-03 DIAGNOSIS — M531 Cervicobrachial syndrome: Secondary | ICD-10-CM | POA: Diagnosis not present

## 2013-03-03 DIAGNOSIS — S339XXA Sprain of unspecified parts of lumbar spine and pelvis, initial encounter: Secondary | ICD-10-CM | POA: Diagnosis not present

## 2013-03-08 DIAGNOSIS — M9981 Other biomechanical lesions of cervical region: Secondary | ICD-10-CM | POA: Diagnosis not present

## 2013-03-08 DIAGNOSIS — M546 Pain in thoracic spine: Secondary | ICD-10-CM | POA: Diagnosis not present

## 2013-03-08 DIAGNOSIS — M4712 Other spondylosis with myelopathy, cervical region: Secondary | ICD-10-CM | POA: Diagnosis not present

## 2013-03-08 DIAGNOSIS — S339XXA Sprain of unspecified parts of lumbar spine and pelvis, initial encounter: Secondary | ICD-10-CM | POA: Diagnosis not present

## 2013-03-08 DIAGNOSIS — M999 Biomechanical lesion, unspecified: Secondary | ICD-10-CM | POA: Diagnosis not present

## 2013-03-08 DIAGNOSIS — M531 Cervicobrachial syndrome: Secondary | ICD-10-CM | POA: Diagnosis not present

## 2013-03-11 DIAGNOSIS — M531 Cervicobrachial syndrome: Secondary | ICD-10-CM | POA: Diagnosis not present

## 2013-03-11 DIAGNOSIS — S339XXA Sprain of unspecified parts of lumbar spine and pelvis, initial encounter: Secondary | ICD-10-CM | POA: Diagnosis not present

## 2013-03-11 DIAGNOSIS — M546 Pain in thoracic spine: Secondary | ICD-10-CM | POA: Diagnosis not present

## 2013-03-11 DIAGNOSIS — M999 Biomechanical lesion, unspecified: Secondary | ICD-10-CM | POA: Diagnosis not present

## 2013-03-11 DIAGNOSIS — M9981 Other biomechanical lesions of cervical region: Secondary | ICD-10-CM | POA: Diagnosis not present

## 2013-03-11 DIAGNOSIS — M4712 Other spondylosis with myelopathy, cervical region: Secondary | ICD-10-CM | POA: Diagnosis not present

## 2013-05-03 DIAGNOSIS — F3161 Bipolar disorder, current episode mixed, mild: Secondary | ICD-10-CM | POA: Diagnosis not present

## 2013-05-04 DIAGNOSIS — Z309 Encounter for contraceptive management, unspecified: Secondary | ICD-10-CM | POA: Diagnosis not present

## 2013-05-04 DIAGNOSIS — Z01419 Encounter for gynecological examination (general) (routine) without abnormal findings: Secondary | ICD-10-CM | POA: Diagnosis not present

## 2013-05-04 DIAGNOSIS — Z6828 Body mass index (BMI) 28.0-28.9, adult: Secondary | ICD-10-CM | POA: Diagnosis not present

## 2013-05-04 DIAGNOSIS — I10 Essential (primary) hypertension: Secondary | ICD-10-CM | POA: Diagnosis not present

## 2013-05-16 DIAGNOSIS — Z1231 Encounter for screening mammogram for malignant neoplasm of breast: Secondary | ICD-10-CM | POA: Diagnosis not present

## 2013-05-23 DIAGNOSIS — F3161 Bipolar disorder, current episode mixed, mild: Secondary | ICD-10-CM | POA: Diagnosis not present

## 2013-05-30 DIAGNOSIS — I10 Essential (primary) hypertension: Secondary | ICD-10-CM | POA: Diagnosis not present

## 2013-05-30 DIAGNOSIS — Z6828 Body mass index (BMI) 28.0-28.9, adult: Secondary | ICD-10-CM | POA: Diagnosis not present

## 2013-05-31 DIAGNOSIS — F3161 Bipolar disorder, current episode mixed, mild: Secondary | ICD-10-CM | POA: Diagnosis not present

## 2013-06-27 DIAGNOSIS — I10 Essential (primary) hypertension: Secondary | ICD-10-CM | POA: Diagnosis not present

## 2013-06-27 DIAGNOSIS — Z6828 Body mass index (BMI) 28.0-28.9, adult: Secondary | ICD-10-CM | POA: Diagnosis not present

## 2013-07-04 DIAGNOSIS — H35369 Drusen (degenerative) of macula, unspecified eye: Secondary | ICD-10-CM | POA: Diagnosis not present

## 2013-07-11 DIAGNOSIS — F3161 Bipolar disorder, current episode mixed, mild: Secondary | ICD-10-CM | POA: Diagnosis not present

## 2013-07-25 DIAGNOSIS — Z6828 Body mass index (BMI) 28.0-28.9, adult: Secondary | ICD-10-CM | POA: Diagnosis not present

## 2013-07-25 DIAGNOSIS — Z309 Encounter for contraceptive management, unspecified: Secondary | ICD-10-CM | POA: Diagnosis not present

## 2013-08-01 DIAGNOSIS — G43919 Migraine, unspecified, intractable, without status migrainosus: Secondary | ICD-10-CM | POA: Diagnosis not present

## 2013-08-01 DIAGNOSIS — R112 Nausea with vomiting, unspecified: Secondary | ICD-10-CM | POA: Diagnosis not present

## 2013-08-01 DIAGNOSIS — R5383 Other fatigue: Secondary | ICD-10-CM | POA: Diagnosis not present

## 2013-08-01 DIAGNOSIS — R197 Diarrhea, unspecified: Secondary | ICD-10-CM | POA: Diagnosis not present

## 2013-08-01 DIAGNOSIS — R5381 Other malaise: Secondary | ICD-10-CM | POA: Diagnosis not present

## 2013-08-02 DIAGNOSIS — R918 Other nonspecific abnormal finding of lung field: Secondary | ICD-10-CM | POA: Diagnosis not present

## 2013-08-02 DIAGNOSIS — R197 Diarrhea, unspecified: Secondary | ICD-10-CM | POA: Diagnosis not present

## 2013-08-02 DIAGNOSIS — Z87440 Personal history of urinary (tract) infections: Secondary | ICD-10-CM | POA: Diagnosis not present

## 2013-08-02 DIAGNOSIS — Z79899 Other long term (current) drug therapy: Secondary | ICD-10-CM | POA: Diagnosis not present

## 2013-08-02 DIAGNOSIS — E860 Dehydration: Secondary | ICD-10-CM | POA: Diagnosis not present

## 2013-08-02 DIAGNOSIS — R112 Nausea with vomiting, unspecified: Secondary | ICD-10-CM | POA: Diagnosis not present

## 2013-08-02 DIAGNOSIS — Z87442 Personal history of urinary calculi: Secondary | ICD-10-CM | POA: Diagnosis not present

## 2013-08-02 DIAGNOSIS — F319 Bipolar disorder, unspecified: Secondary | ICD-10-CM | POA: Diagnosis not present

## 2013-08-02 DIAGNOSIS — R1110 Vomiting, unspecified: Secondary | ICD-10-CM | POA: Diagnosis not present

## 2013-09-28 DIAGNOSIS — Z87442 Personal history of urinary calculi: Secondary | ICD-10-CM | POA: Diagnosis not present

## 2013-09-28 DIAGNOSIS — N3000 Acute cystitis without hematuria: Secondary | ICD-10-CM | POA: Diagnosis not present

## 2013-10-03 DIAGNOSIS — F3161 Bipolar disorder, current episode mixed, mild: Secondary | ICD-10-CM | POA: Diagnosis not present

## 2013-10-03 DIAGNOSIS — F5000 Anorexia nervosa, unspecified: Secondary | ICD-10-CM | POA: Diagnosis not present

## 2013-11-17 DIAGNOSIS — W57XXXA Bitten or stung by nonvenomous insect and other nonvenomous arthropods, initial encounter: Secondary | ICD-10-CM | POA: Diagnosis not present

## 2013-11-17 DIAGNOSIS — T148 Other injury of unspecified body region: Secondary | ICD-10-CM | POA: Diagnosis not present

## 2013-11-17 DIAGNOSIS — J029 Acute pharyngitis, unspecified: Secondary | ICD-10-CM | POA: Diagnosis not present

## 2013-11-17 DIAGNOSIS — R197 Diarrhea, unspecified: Secondary | ICD-10-CM | POA: Diagnosis not present

## 2013-12-06 DIAGNOSIS — F319 Bipolar disorder, unspecified: Secondary | ICD-10-CM | POA: Diagnosis not present

## 2013-12-06 DIAGNOSIS — Z79899 Other long term (current) drug therapy: Secondary | ICD-10-CM | POA: Diagnosis not present

## 2013-12-06 DIAGNOSIS — I10 Essential (primary) hypertension: Secondary | ICD-10-CM | POA: Diagnosis not present

## 2013-12-06 DIAGNOSIS — E876 Hypokalemia: Secondary | ICD-10-CM | POA: Diagnosis not present

## 2013-12-06 DIAGNOSIS — R209 Unspecified disturbances of skin sensation: Secondary | ICD-10-CM | POA: Diagnosis not present

## 2013-12-06 DIAGNOSIS — R030 Elevated blood-pressure reading, without diagnosis of hypertension: Secondary | ICD-10-CM | POA: Diagnosis not present

## 2013-12-06 DIAGNOSIS — R110 Nausea: Secondary | ICD-10-CM | POA: Diagnosis not present

## 2013-12-08 DIAGNOSIS — E876 Hypokalemia: Secondary | ICD-10-CM | POA: Diagnosis not present

## 2013-12-09 DIAGNOSIS — R030 Elevated blood-pressure reading, without diagnosis of hypertension: Secondary | ICD-10-CM | POA: Diagnosis not present

## 2013-12-09 DIAGNOSIS — R209 Unspecified disturbances of skin sensation: Secondary | ICD-10-CM | POA: Diagnosis not present

## 2013-12-22 DIAGNOSIS — R5383 Other fatigue: Secondary | ICD-10-CM | POA: Diagnosis not present

## 2013-12-22 DIAGNOSIS — I10 Essential (primary) hypertension: Secondary | ICD-10-CM | POA: Diagnosis not present

## 2013-12-22 DIAGNOSIS — R5381 Other malaise: Secondary | ICD-10-CM | POA: Diagnosis not present

## 2013-12-22 DIAGNOSIS — E876 Hypokalemia: Secondary | ICD-10-CM | POA: Diagnosis not present

## 2014-01-02 DIAGNOSIS — F3161 Bipolar disorder, current episode mixed, mild: Secondary | ICD-10-CM | POA: Diagnosis not present

## 2014-01-04 DIAGNOSIS — Z3049 Encounter for surveillance of other contraceptives: Secondary | ICD-10-CM | POA: Diagnosis not present

## 2014-03-02 DIAGNOSIS — F3132 Bipolar disorder, current episode depressed, moderate: Secondary | ICD-10-CM | POA: Diagnosis not present

## 2014-03-02 DIAGNOSIS — F5000 Anorexia nervosa, unspecified: Secondary | ICD-10-CM | POA: Diagnosis not present

## 2014-03-28 DIAGNOSIS — Z308 Encounter for other contraceptive management: Secondary | ICD-10-CM | POA: Diagnosis not present

## 2014-04-13 DIAGNOSIS — R531 Weakness: Secondary | ICD-10-CM | POA: Diagnosis not present

## 2014-04-13 DIAGNOSIS — Z87442 Personal history of urinary calculi: Secondary | ICD-10-CM | POA: Diagnosis not present

## 2014-04-13 DIAGNOSIS — F319 Bipolar disorder, unspecified: Secondary | ICD-10-CM | POA: Diagnosis not present

## 2014-04-13 DIAGNOSIS — Z885 Allergy status to narcotic agent status: Secondary | ICD-10-CM | POA: Diagnosis not present

## 2014-04-13 DIAGNOSIS — R1032 Left lower quadrant pain: Secondary | ICD-10-CM | POA: Diagnosis not present

## 2014-04-13 DIAGNOSIS — E860 Dehydration: Secondary | ICD-10-CM | POA: Diagnosis not present

## 2014-04-13 DIAGNOSIS — Z9049 Acquired absence of other specified parts of digestive tract: Secondary | ICD-10-CM | POA: Diagnosis not present

## 2014-04-13 DIAGNOSIS — R319 Hematuria, unspecified: Secondary | ICD-10-CM | POA: Diagnosis not present

## 2014-04-13 DIAGNOSIS — Z79899 Other long term (current) drug therapy: Secondary | ICD-10-CM | POA: Diagnosis not present

## 2014-04-13 DIAGNOSIS — N200 Calculus of kidney: Secondary | ICD-10-CM | POA: Diagnosis not present

## 2014-04-13 DIAGNOSIS — F329 Major depressive disorder, single episode, unspecified: Secondary | ICD-10-CM | POA: Diagnosis not present

## 2014-04-13 DIAGNOSIS — R109 Unspecified abdominal pain: Secondary | ICD-10-CM | POA: Diagnosis not present

## 2014-04-13 DIAGNOSIS — Z87440 Personal history of urinary (tract) infections: Secondary | ICD-10-CM | POA: Diagnosis not present

## 2014-04-14 DIAGNOSIS — R319 Hematuria, unspecified: Secondary | ICD-10-CM | POA: Diagnosis not present

## 2014-04-14 DIAGNOSIS — N200 Calculus of kidney: Secondary | ICD-10-CM | POA: Diagnosis not present

## 2014-05-09 DIAGNOSIS — F3132 Bipolar disorder, current episode depressed, moderate: Secondary | ICD-10-CM | POA: Diagnosis not present

## 2014-05-10 DIAGNOSIS — Z01419 Encounter for gynecological examination (general) (routine) without abnormal findings: Secondary | ICD-10-CM | POA: Diagnosis not present

## 2014-05-30 DIAGNOSIS — Z1231 Encounter for screening mammogram for malignant neoplasm of breast: Secondary | ICD-10-CM | POA: Diagnosis not present

## 2014-06-22 DIAGNOSIS — Z308 Encounter for other contraceptive management: Secondary | ICD-10-CM | POA: Diagnosis not present

## 2014-06-27 DIAGNOSIS — F3131 Bipolar disorder, current episode depressed, mild: Secondary | ICD-10-CM | POA: Diagnosis not present

## 2014-07-27 DIAGNOSIS — G43009 Migraine without aura, not intractable, without status migrainosus: Secondary | ICD-10-CM | POA: Diagnosis not present

## 2014-07-27 DIAGNOSIS — R51 Headache: Secondary | ICD-10-CM | POA: Diagnosis not present

## 2014-07-27 DIAGNOSIS — R110 Nausea: Secondary | ICD-10-CM | POA: Diagnosis not present

## 2014-08-03 DIAGNOSIS — R739 Hyperglycemia, unspecified: Secondary | ICD-10-CM | POA: Diagnosis not present

## 2014-08-03 DIAGNOSIS — M79673 Pain in unspecified foot: Secondary | ICD-10-CM | POA: Diagnosis not present

## 2014-08-07 DIAGNOSIS — Z87440 Personal history of urinary (tract) infections: Secondary | ICD-10-CM | POA: Diagnosis not present

## 2014-08-07 DIAGNOSIS — M545 Low back pain: Secondary | ICD-10-CM | POA: Diagnosis not present

## 2014-08-07 DIAGNOSIS — M25562 Pain in left knee: Secondary | ICD-10-CM | POA: Diagnosis not present

## 2014-08-07 DIAGNOSIS — W108XXA Fall (on) (from) other stairs and steps, initial encounter: Secondary | ICD-10-CM | POA: Diagnosis not present

## 2014-08-07 DIAGNOSIS — S339XXA Sprain of unspecified parts of lumbar spine and pelvis, initial encounter: Secondary | ICD-10-CM | POA: Diagnosis not present

## 2014-08-07 DIAGNOSIS — F319 Bipolar disorder, unspecified: Secondary | ICD-10-CM | POA: Diagnosis not present

## 2014-08-07 DIAGNOSIS — S83422A Sprain of lateral collateral ligament of left knee, initial encounter: Secondary | ICD-10-CM | POA: Diagnosis not present

## 2014-08-07 DIAGNOSIS — Z87442 Personal history of urinary calculi: Secondary | ICD-10-CM | POA: Diagnosis not present

## 2014-08-07 DIAGNOSIS — S8992XA Unspecified injury of left lower leg, initial encounter: Secondary | ICD-10-CM | POA: Diagnosis not present

## 2014-08-07 DIAGNOSIS — M549 Dorsalgia, unspecified: Secondary | ICD-10-CM | POA: Diagnosis not present

## 2014-08-07 DIAGNOSIS — S3992XA Unspecified injury of lower back, initial encounter: Secondary | ICD-10-CM | POA: Diagnosis not present

## 2014-08-07 DIAGNOSIS — S335XXA Sprain of ligaments of lumbar spine, initial encounter: Secondary | ICD-10-CM | POA: Diagnosis not present

## 2014-08-07 DIAGNOSIS — N200 Calculus of kidney: Secondary | ICD-10-CM | POA: Diagnosis not present

## 2014-08-07 DIAGNOSIS — Z79899 Other long term (current) drug therapy: Secondary | ICD-10-CM | POA: Diagnosis not present

## 2014-08-29 DIAGNOSIS — M25471 Effusion, right ankle: Secondary | ICD-10-CM | POA: Diagnosis not present

## 2014-08-29 DIAGNOSIS — M25472 Effusion, left ankle: Secondary | ICD-10-CM | POA: Diagnosis not present

## 2014-08-29 DIAGNOSIS — F319 Bipolar disorder, unspecified: Secondary | ICD-10-CM | POA: Diagnosis not present

## 2014-08-29 DIAGNOSIS — Z79899 Other long term (current) drug therapy: Secondary | ICD-10-CM | POA: Diagnosis not present

## 2014-08-29 DIAGNOSIS — R600 Localized edema: Secondary | ICD-10-CM | POA: Diagnosis not present

## 2014-08-29 DIAGNOSIS — S99911A Unspecified injury of right ankle, initial encounter: Secondary | ICD-10-CM | POA: Diagnosis not present

## 2014-08-29 DIAGNOSIS — M25473 Effusion, unspecified ankle: Secondary | ICD-10-CM | POA: Diagnosis not present

## 2014-08-29 DIAGNOSIS — Z87442 Personal history of urinary calculi: Secondary | ICD-10-CM | POA: Diagnosis not present

## 2014-09-05 DIAGNOSIS — J0101 Acute recurrent maxillary sinusitis: Secondary | ICD-10-CM | POA: Diagnosis not present

## 2014-09-05 DIAGNOSIS — S86912A Strain of unspecified muscle(s) and tendon(s) at lower leg level, left leg, initial encounter: Secondary | ICD-10-CM | POA: Diagnosis not present

## 2014-09-07 ENCOUNTER — Emergency Department (HOSPITAL_COMMUNITY)
Admission: EM | Admit: 2014-09-07 | Discharge: 2014-09-07 | Disposition: A | Discharge status: Home / Self Care | Payer: Medicare Other | Attending: Emergency Medicine | Admitting: Emergency Medicine

## 2014-09-07 ENCOUNTER — Encounter (HOSPITAL_COMMUNITY): Payer: Self-pay | Admitting: Emergency Medicine

## 2014-09-07 ENCOUNTER — Emergency Department (HOSPITAL_COMMUNITY): Payer: Medicare Other

## 2014-09-07 DIAGNOSIS — I10 Essential (primary) hypertension: Secondary | ICD-10-CM | POA: Diagnosis not present

## 2014-09-07 DIAGNOSIS — Z79899 Other long term (current) drug therapy: Secondary | ICD-10-CM | POA: Diagnosis not present

## 2014-09-07 DIAGNOSIS — Z792 Long term (current) use of antibiotics: Secondary | ICD-10-CM | POA: Insufficient documentation

## 2014-09-07 DIAGNOSIS — R079 Chest pain, unspecified: Secondary | ICD-10-CM | POA: Diagnosis not present

## 2014-09-07 DIAGNOSIS — Z7722 Contact with and (suspected) exposure to environmental tobacco smoke (acute) (chronic): Secondary | ICD-10-CM | POA: Diagnosis not present

## 2014-09-07 DIAGNOSIS — J40 Bronchitis, not specified as acute or chronic: Secondary | ICD-10-CM | POA: Diagnosis not present

## 2014-09-07 DIAGNOSIS — R05 Cough: Secondary | ICD-10-CM | POA: Diagnosis not present

## 2014-09-07 DIAGNOSIS — Z87448 Personal history of other diseases of urinary system: Secondary | ICD-10-CM | POA: Insufficient documentation

## 2014-09-07 HISTORY — DX: Essential (primary) hypertension: I10

## 2014-09-07 HISTORY — DX: Disorder of kidney and ureter, unspecified: N28.9

## 2014-09-07 MED ORDER — ALBUTEROL SULFATE HFA 108 (90 BASE) MCG/ACT IN AERS
2.0000 | INHALATION_SPRAY | Freq: Once | RESPIRATORY_TRACT | Status: AC
  Administered 2014-09-07: 2 via RESPIRATORY_TRACT
  Filled 2014-09-07: qty 6.7

## 2014-09-07 MED ORDER — HYDROCOD POLST-CHLORPHEN POLST 10-8 MG/5ML PO LQCR
5.0000 mL | Freq: Once | ORAL | Status: AC
  Administered 2014-09-07: 5 mL via ORAL
  Filled 2014-09-07: qty 5

## 2014-09-07 MED ORDER — FLUCONAZOLE 200 MG PO TABS: 200.0000 mg | ORAL_TABLET | Freq: Once | ORAL | Status: AC

## 2014-09-07 MED ORDER — GUAIFENESIN-CODEINE 100-10 MG/5ML PO SYRP: 10.0000 mL | ORAL_SOLUTION | Freq: Three times a day (TID) | ORAL | Status: DC | PRN

## 2014-09-07 MED ORDER — GUAIFENESIN-CODEINE 100-10 MG/5ML PO SOLN
10.0000 mL | Freq: Once | ORAL | Status: AC
Start: 2014-09-07 — End: 2014-09-07
  Administered 2014-09-07: 10 mL via ORAL
  Filled 2014-09-07: qty 10

## 2014-09-07 NOTE — ED Notes (Signed)
Was seen at [facility] in [place], Tuesday for sinus infections and was treated steroid shot, antibiotics and peril.  Notice blood in sputum yesterday.  Denies any pain.

## 2014-09-10 NOTE — ED Provider Notes (Signed)
CSN: [id number]     Arrival date & time 09/07/14  1519 History   First MD Initiated Contact with Patient 09/07/14 1604     Chief Complaint  Patient presents with  . Hemoptysis     (Consider location/radiation/quality/duration/timing/severity/associated sxs/prior Treatment) HPI   [name] is a 44 y.o. female who presents to the Emergency Department complaining of continued cough , nasal congestion for nearly one week.  She states that she was seen by her PMD earlier this week and treated with a steroid shot, cough medication and antibiotic.  She comes to the ED complaining of bloody streaks in her sputum.  She reports noticing this intermittently for one day.  She also reports very persistent, harsh coughing that she can not control. She denies fever, chest or abdominal pain, shortness of breath or vomiting.  She also denies weight loss, night sweats, or recent trips out of the country.      Past Medical History  Diagnosis Date  . Hypertension   . Renal disorder    Past Surgical History  Procedure Laterality Date  . Cholecystectomy    . Kidney stones     Family History  Problem Relation Age of Onset  . Cancer Mother   . Heart failure Mother   . Cancer Father    History  Substance Use Topics  . Smoking status: Passive Smoke Exposure - Never Smoker  . Smokeless tobacco: Not on file  . Alcohol Use: No   OB History    No data available     Review of Systems  Constitutional: Negative for fever, chills and appetite change.  HENT: Positive for congestion. Negative for rhinorrhea, sinus pressure, sore throat and trouble swallowing.   Respiratory: Positive for cough. Negative for chest tightness, shortness of breath and wheezing.   Cardiovascular: Negative for chest pain.  Gastrointestinal: Negative for nausea, vomiting and abdominal pain.  Genitourinary: Negative for dysuria.  Musculoskeletal: Negative for arthralgias.  Skin: Negative for rash.  Neurological: Negative  for dizziness, weakness and numbness.  Hematological: Negative for adenopathy.  All other systems reviewed and are negative.     Allergies  Morphine and related; Sulfa antibiotics; and Tape  Home Medications   Prior to Admission medications   Medication Sig Start Date End Date Taking? Authorizing Provider  amoxicillin-clavulanate (AUGMENTIN) 875-125 MG per tablet Take 1 tablet by mouth 2 (two) times daily.   Yes Historical Provider, MD  benzonatate (TESSALON) 200 MG capsule Take 200 mg by mouth 3 (three) times daily as needed for cough.   Yes Historical Provider, MD  fluconazole (DIFLUCAN) 200 MG tablet Take 1 tablet (200 mg total) by mouth once. 09/07/14 09/14/14  Tammi Yadriel Kerrigan, PA-C  guaiFENesin-codeine (ROBITUSSIN AC) 100-10 MG/5ML syrup Take 10 mLs by mouth 3 (three) times daily as needed. 09/07/14   Tammi Menno Vanbergen, PA-C   BP 137/80 mmHg  Pulse 113  Temp(Src) 98.7 F (37.1 C) (Oral)  Resp 20  Ht 5\' 2"  (1.575 m)  Wt 170 lb (77.111 kg)  BMI 31.09 kg/m2  SpO2 94% Physical Exam  Constitutional: She is oriented to person, place, and time. She appears well-developed and well-nourished. No distress.  HENT:  Head: Normocephalic and atraumatic.  Right Ear: Tympanic membrane and ear canal normal.  Left Ear: Tympanic membrane and ear canal normal.  Mouth/Throat: Uvula is midline, oropharynx is clear and moist and mucous membranes are normal. No oropharyngeal exudate.  Eyes: EOM are normal. Pupils are equal, round, and reactive to light.  Neck: Normal range of motion, full passive range of motion without pain and phonation normal. Neck supple.  Cardiovascular: Normal rate, regular rhythm, normal heart sounds and intact distal pulses.   No murmur heard. Pulmonary/Chest: Effort normal. No stridor. No respiratory distress. She has no rales. She exhibits no tenderness.  Actively coughing during exam.  Coarse lungs sounds bilaterally.  No rales or wheezing.  Abdominal: Soft. She exhibits no  distension. There is no tenderness. There is no rebound and no guarding.  Musculoskeletal: She exhibits no edema.  Lymphadenopathy:    She has no cervical adenopathy.  Neurological: She is alert and oriented to person, place, and time. She exhibits normal muscle tone. Coordination normal.  Skin: Skin is warm and dry.  Nursing note and vitals reviewed.   ED Course  Procedures (including critical care time) Labs Review Labs Reviewed - No data to display  Imaging Review Dg Chest 2 View  09/07/2014   CLINICAL DATA:  Productive cough with hemoptysis and chest pain for 6 days  EXAM: CHEST  2 VIEW  COMPARISON:  None.  FINDINGS: The heart size and mediastinal contours are within normal limits. Both lungs are clear. The visualized skeletal structures are unremarkable except for mild thoracic degenerative change diffusely. Prior cholecystectomy noted.  IMPRESSION: No active cardiopulmonary disease.   Electronically Signed   By: Jerilynn Mages.  Shick M.D.   On: 09/07/2014 16:43      EKG Interpretation None      MDM   Final diagnoses:  Bronchitis    Pt is well appearing, non-toxic.  Currently taking augmentin prescribed by her PMD.  Advised to d/c the tessalon and I will rx robutussin AC, albuterol inhaler dispensed.    Cough finally improving after albuterol inhaler, tussionex and robitussin AC.  Pt is feeling better and appears stable for d/c.  Likely bronchitis, but TB and PE were considered.  Pt agrees to treatment plan, close PMD f/u and to return to ER for any worsening symptoms.    Patrice Paradise, PA-C 09/10/14 [place], DO 09/12/14 1820

## 2014-10-27 DIAGNOSIS — F3131 Bipolar disorder, current episode depressed, mild: Secondary | ICD-10-CM | POA: Diagnosis not present

## 2014-10-27 DIAGNOSIS — F5000 Anorexia nervosa, unspecified: Secondary | ICD-10-CM | POA: Diagnosis not present

## 2014-11-14 DIAGNOSIS — N951 Menopausal and female climacteric states: Secondary | ICD-10-CM | POA: Diagnosis not present

## 2014-11-14 DIAGNOSIS — I10 Essential (primary) hypertension: Secondary | ICD-10-CM | POA: Diagnosis not present

## 2014-12-22 DIAGNOSIS — I10 Essential (primary) hypertension: Secondary | ICD-10-CM | POA: Diagnosis not present

## 2014-12-22 DIAGNOSIS — Z885 Allergy status to narcotic agent status: Secondary | ICD-10-CM | POA: Diagnosis not present

## 2014-12-22 DIAGNOSIS — M47812 Spondylosis without myelopathy or radiculopathy, cervical region: Secondary | ICD-10-CM | POA: Diagnosis not present

## 2014-12-22 DIAGNOSIS — F319 Bipolar disorder, unspecified: Secondary | ICD-10-CM | POA: Diagnosis not present

## 2014-12-22 DIAGNOSIS — N179 Acute kidney failure, unspecified: Secondary | ICD-10-CM | POA: Diagnosis not present

## 2014-12-22 DIAGNOSIS — Z87442 Personal history of urinary calculi: Secondary | ICD-10-CM | POA: Diagnosis not present

## 2014-12-22 DIAGNOSIS — E876 Hypokalemia: Secondary | ICD-10-CM | POA: Diagnosis not present

## 2014-12-22 DIAGNOSIS — Z79899 Other long term (current) drug therapy: Secondary | ICD-10-CM | POA: Diagnosis not present

## 2014-12-22 DIAGNOSIS — G43909 Migraine, unspecified, not intractable, without status migrainosus: Secondary | ICD-10-CM | POA: Diagnosis not present

## 2014-12-22 DIAGNOSIS — E860 Dehydration: Secondary | ICD-10-CM | POA: Diagnosis not present

## 2014-12-22 DIAGNOSIS — R030 Elevated blood-pressure reading, without diagnosis of hypertension: Secondary | ICD-10-CM | POA: Diagnosis not present

## 2014-12-22 DIAGNOSIS — Z87440 Personal history of urinary (tract) infections: Secondary | ICD-10-CM | POA: Diagnosis not present

## 2014-12-22 DIAGNOSIS — Z6832 Body mass index (BMI) 32.0-32.9, adult: Secondary | ICD-10-CM | POA: Diagnosis not present

## 2014-12-22 DIAGNOSIS — Z882 Allergy status to sulfonamides status: Secondary | ICD-10-CM | POA: Diagnosis not present

## 2014-12-22 DIAGNOSIS — M545 Low back pain: Secondary | ICD-10-CM | POA: Diagnosis not present

## 2014-12-22 DIAGNOSIS — R55 Syncope and collapse: Secondary | ICD-10-CM | POA: Diagnosis not present

## 2014-12-23 DIAGNOSIS — R030 Elevated blood-pressure reading, without diagnosis of hypertension: Secondary | ICD-10-CM | POA: Diagnosis not present

## 2014-12-23 DIAGNOSIS — M545 Low back pain: Secondary | ICD-10-CM | POA: Diagnosis not present

## 2014-12-23 DIAGNOSIS — R55 Syncope and collapse: Secondary | ICD-10-CM | POA: Diagnosis not present

## 2014-12-24 DIAGNOSIS — I10 Essential (primary) hypertension: Secondary | ICD-10-CM | POA: Diagnosis not present

## 2015-01-24 DIAGNOSIS — F3131 Bipolar disorder, current episode depressed, mild: Secondary | ICD-10-CM | POA: Diagnosis not present

## 2015-02-12 DIAGNOSIS — I10 Essential (primary) hypertension: Secondary | ICD-10-CM | POA: Diagnosis not present

## 2015-02-12 DIAGNOSIS — E876 Hypokalemia: Secondary | ICD-10-CM | POA: Diagnosis not present

## 2015-02-12 DIAGNOSIS — N200 Calculus of kidney: Secondary | ICD-10-CM | POA: Diagnosis not present

## 2015-02-12 DIAGNOSIS — Z23 Encounter for immunization: Secondary | ICD-10-CM | POA: Diagnosis not present

## 2015-02-21 DIAGNOSIS — R319 Hematuria, unspecified: Secondary | ICD-10-CM | POA: Diagnosis not present

## 2015-02-21 DIAGNOSIS — R938 Abnormal findings on diagnostic imaging of other specified body structures: Secondary | ICD-10-CM | POA: Diagnosis not present

## 2015-02-21 DIAGNOSIS — R109 Unspecified abdominal pain: Secondary | ICD-10-CM | POA: Diagnosis not present

## 2015-02-22 DIAGNOSIS — F419 Anxiety disorder, unspecified: Secondary | ICD-10-CM | POA: Diagnosis not present

## 2015-02-22 DIAGNOSIS — Z882 Allergy status to sulfonamides status: Secondary | ICD-10-CM | POA: Diagnosis not present

## 2015-02-22 DIAGNOSIS — F319 Bipolar disorder, unspecified: Secondary | ICD-10-CM | POA: Diagnosis not present

## 2015-02-22 DIAGNOSIS — I10 Essential (primary) hypertension: Secondary | ICD-10-CM | POA: Diagnosis not present

## 2015-02-22 DIAGNOSIS — R3915 Urgency of urination: Secondary | ICD-10-CM | POA: Diagnosis not present

## 2015-02-22 DIAGNOSIS — Z885 Allergy status to narcotic agent status: Secondary | ICD-10-CM | POA: Diagnosis not present

## 2015-02-22 DIAGNOSIS — R351 Nocturia: Secondary | ICD-10-CM | POA: Diagnosis not present

## 2015-02-22 DIAGNOSIS — N201 Calculus of ureter: Secondary | ICD-10-CM | POA: Diagnosis not present

## 2015-02-22 DIAGNOSIS — R109 Unspecified abdominal pain: Secondary | ICD-10-CM | POA: Diagnosis not present

## 2015-02-22 DIAGNOSIS — F329 Major depressive disorder, single episode, unspecified: Secondary | ICD-10-CM | POA: Diagnosis not present

## 2015-02-22 DIAGNOSIS — Z79899 Other long term (current) drug therapy: Secondary | ICD-10-CM | POA: Diagnosis not present

## 2015-02-22 DIAGNOSIS — N132 Hydronephrosis with renal and ureteral calculous obstruction: Secondary | ICD-10-CM | POA: Diagnosis not present

## 2015-02-23 DIAGNOSIS — Z885 Allergy status to narcotic agent status: Secondary | ICD-10-CM | POA: Diagnosis not present

## 2015-02-23 DIAGNOSIS — Z466 Encounter for fitting and adjustment of urinary device: Secondary | ICD-10-CM | POA: Diagnosis not present

## 2015-02-23 DIAGNOSIS — N201 Calculus of ureter: Secondary | ICD-10-CM | POA: Diagnosis not present

## 2015-02-23 DIAGNOSIS — Z882 Allergy status to sulfonamides status: Secondary | ICD-10-CM | POA: Diagnosis not present

## 2015-02-23 DIAGNOSIS — N23 Unspecified renal colic: Secondary | ICD-10-CM | POA: Diagnosis not present

## 2015-02-23 DIAGNOSIS — Z79899 Other long term (current) drug therapy: Secondary | ICD-10-CM | POA: Diagnosis not present

## 2015-02-23 DIAGNOSIS — F419 Anxiety disorder, unspecified: Secondary | ICD-10-CM | POA: Diagnosis not present

## 2015-02-23 DIAGNOSIS — N132 Hydronephrosis with renal and ureteral calculous obstruction: Secondary | ICD-10-CM | POA: Diagnosis not present

## 2015-02-23 DIAGNOSIS — F329 Major depressive disorder, single episode, unspecified: Secondary | ICD-10-CM | POA: Diagnosis not present

## 2015-02-23 DIAGNOSIS — F319 Bipolar disorder, unspecified: Secondary | ICD-10-CM | POA: Diagnosis not present

## 2015-02-23 DIAGNOSIS — I10 Essential (primary) hypertension: Secondary | ICD-10-CM | POA: Diagnosis not present

## 2015-02-26 DIAGNOSIS — F319 Bipolar disorder, unspecified: Secondary | ICD-10-CM | POA: Diagnosis not present

## 2015-02-26 DIAGNOSIS — Z882 Allergy status to sulfonamides status: Secondary | ICD-10-CM | POA: Diagnosis not present

## 2015-02-26 DIAGNOSIS — N200 Calculus of kidney: Secondary | ICD-10-CM | POA: Diagnosis not present

## 2015-02-26 DIAGNOSIS — Z792 Long term (current) use of antibiotics: Secondary | ICD-10-CM | POA: Diagnosis not present

## 2015-02-26 DIAGNOSIS — R1013 Epigastric pain: Secondary | ICD-10-CM | POA: Diagnosis not present

## 2015-02-26 DIAGNOSIS — Z79891 Long term (current) use of opiate analgesic: Secondary | ICD-10-CM | POA: Diagnosis not present

## 2015-02-26 DIAGNOSIS — Z885 Allergy status to narcotic agent status: Secondary | ICD-10-CM | POA: Diagnosis not present

## 2015-02-26 DIAGNOSIS — R109 Unspecified abdominal pain: Secondary | ICD-10-CM | POA: Diagnosis not present

## 2015-02-26 DIAGNOSIS — I2510 Atherosclerotic heart disease of native coronary artery without angina pectoris: Secondary | ICD-10-CM | POA: Diagnosis not present

## 2015-02-26 DIAGNOSIS — T8384XA Pain from genitourinary prosthetic devices, implants and grafts, initial encounter: Secondary | ICD-10-CM | POA: Diagnosis not present

## 2015-02-26 DIAGNOSIS — N201 Calculus of ureter: Secondary | ICD-10-CM | POA: Diagnosis not present

## 2015-02-26 DIAGNOSIS — R1011 Right upper quadrant pain: Secondary | ICD-10-CM | POA: Diagnosis not present

## 2015-02-26 DIAGNOSIS — R1012 Left upper quadrant pain: Secondary | ICD-10-CM | POA: Diagnosis not present

## 2015-02-26 DIAGNOSIS — K5900 Constipation, unspecified: Secondary | ICD-10-CM | POA: Diagnosis not present

## 2015-02-26 DIAGNOSIS — Z809 Family history of malignant neoplasm, unspecified: Secondary | ICD-10-CM | POA: Diagnosis not present

## 2015-02-26 DIAGNOSIS — I10 Essential (primary) hypertension: Secondary | ICD-10-CM | POA: Diagnosis not present

## 2015-02-26 DIAGNOSIS — N23 Unspecified renal colic: Secondary | ICD-10-CM | POA: Diagnosis not present

## 2015-02-27 DIAGNOSIS — R109 Unspecified abdominal pain: Secondary | ICD-10-CM | POA: Diagnosis not present

## 2015-02-27 DIAGNOSIS — N201 Calculus of ureter: Secondary | ICD-10-CM | POA: Diagnosis not present

## 2015-02-27 DIAGNOSIS — N23 Unspecified renal colic: Secondary | ICD-10-CM | POA: Diagnosis not present

## 2015-02-28 DIAGNOSIS — R109 Unspecified abdominal pain: Secondary | ICD-10-CM | POA: Diagnosis not present

## 2015-02-28 DIAGNOSIS — N23 Unspecified renal colic: Secondary | ICD-10-CM | POA: Diagnosis not present

## 2015-02-28 DIAGNOSIS — N201 Calculus of ureter: Secondary | ICD-10-CM | POA: Diagnosis not present

## 2015-03-09 DIAGNOSIS — N201 Calculus of ureter: Secondary | ICD-10-CM | POA: Diagnosis not present

## 2015-03-09 DIAGNOSIS — Z960 Presence of urogenital implants: Secondary | ICD-10-CM | POA: Diagnosis not present

## 2015-04-09 DIAGNOSIS — J209 Acute bronchitis, unspecified: Secondary | ICD-10-CM | POA: Diagnosis not present

## 2015-04-20 ENCOUNTER — Emergency Department (HOSPITAL_COMMUNITY): Payer: Medicare Other

## 2015-04-20 ENCOUNTER — Emergency Department (HOSPITAL_COMMUNITY)
Admission: EM | Admit: 2015-04-20 | Discharge: 2015-04-20 | Disposition: A | Discharge status: Home / Self Care | Payer: Medicare Other | Attending: Emergency Medicine | Admitting: Emergency Medicine

## 2015-04-20 ENCOUNTER — Encounter (HOSPITAL_COMMUNITY): Payer: Self-pay

## 2015-04-20 DIAGNOSIS — J40 Bronchitis, not specified as acute or chronic: Secondary | ICD-10-CM | POA: Insufficient documentation

## 2015-04-20 DIAGNOSIS — R1012 Left upper quadrant pain: Secondary | ICD-10-CM | POA: Diagnosis not present

## 2015-04-20 DIAGNOSIS — N200 Calculus of kidney: Secondary | ICD-10-CM | POA: Diagnosis not present

## 2015-04-20 DIAGNOSIS — I10 Essential (primary) hypertension: Secondary | ICD-10-CM | POA: Insufficient documentation

## 2015-04-20 DIAGNOSIS — Z3202 Encounter for pregnancy test, result negative: Secondary | ICD-10-CM | POA: Insufficient documentation

## 2015-04-20 DIAGNOSIS — R079 Chest pain, unspecified: Secondary | ICD-10-CM | POA: Diagnosis present

## 2015-04-20 DIAGNOSIS — Z9049 Acquired absence of other specified parts of digestive tract: Secondary | ICD-10-CM | POA: Diagnosis not present

## 2015-04-20 DIAGNOSIS — Z79899 Other long term (current) drug therapy: Secondary | ICD-10-CM | POA: Insufficient documentation

## 2015-04-20 DIAGNOSIS — Z87448 Personal history of other diseases of urinary system: Secondary | ICD-10-CM | POA: Diagnosis not present

## 2015-04-20 DIAGNOSIS — R0781 Pleurodynia: Secondary | ICD-10-CM | POA: Diagnosis not present

## 2015-04-20 DIAGNOSIS — Z7722 Contact with and (suspected) exposure to environmental tobacco smoke (acute) (chronic): Secondary | ICD-10-CM | POA: Diagnosis not present

## 2015-04-20 LAB — COMPREHENSIVE METABOLIC PANEL
ALK PHOS: 134 U/L — AB (ref 38–126)
ALT: 20 U/L (ref 14–54)
ANION GAP: 7 (ref 5–15)
AST: 19 U/L (ref 15–41)
Albumin: 3.9 g/dL (ref 3.5–5.0)
BILIRUBIN TOTAL: 0.3 mg/dL (ref 0.3–1.2)
BUN: 20 mg/dL (ref 6–20)
CALCIUM: 8.7 mg/dL — AB (ref 8.9–10.3)
CO2: 25 mmol/L (ref 22–32)
CREATININE: 1.48 mg/dL — AB (ref 0.44–1.00)
Chloride: 108 mmol/L (ref 101–111)
GFR, EST AFRICAN AMERICAN: 49 mL/min — AB (ref >60)
GFR, EST NON AFRICAN AMERICAN: 42 mL/min — AB (ref >60)
Glucose, Bld: 84 mg/dL (ref 65–99)
Potassium: 2.9 mmol/L — ABNORMAL LOW (ref 3.5–5.1)
SODIUM: 140 mmol/L (ref 135–145)
TOTAL PROTEIN: 7.3 g/dL (ref 6.5–8.1)

## 2015-04-20 LAB — URINALYSIS, ROUTINE W REFLEX MICROSCOPIC
BILIRUBIN URINE: NEGATIVE
Glucose, UA: NEGATIVE mg/dL
KETONES UR: NEGATIVE mg/dL
LEUKOCYTES UA: NEGATIVE
NITRITE: NEGATIVE
PH: 7 (ref 5.0–8.0)
PROTEIN: NEGATIVE mg/dL
Specific Gravity, Urine: 1.015 (ref 1.005–1.030)

## 2015-04-20 LAB — CBC WITH DIFFERENTIAL/PLATELET
Basophils Absolute: 0.1 10*3/uL (ref 0.0–0.1)
Basophils Relative: 1 %
EOS ABS: 0.4 10*3/uL (ref 0.0–0.7)
Eosinophils Relative: 3 %
HCT: 38.5 % (ref 36.0–46.0)
HEMOGLOBIN: 12.6 g/dL (ref 12.0–15.0)
LYMPHS ABS: 4.3 10*3/uL — AB (ref 0.7–4.0)
LYMPHS PCT: 34 %
MCH: 29.3 pg (ref 26.0–34.0)
MCHC: 32.7 g/dL (ref 30.0–36.0)
MCV: 89.5 fL (ref 78.0–100.0)
MONOS PCT: 6 %
Monocytes Absolute: 0.8 10*3/uL (ref 0.1–1.0)
NEUTROS PCT: 56 %
Neutro Abs: 7.2 10*3/uL (ref 1.7–7.7)
Platelets: 299 10*3/uL (ref 150–400)
RBC: 4.3 MIL/uL (ref 3.87–5.11)
RDW: 13.8 % (ref 11.5–15.5)
WBC: 12.7 10*3/uL — AB (ref 4.0–10.5)

## 2015-04-20 LAB — URINE MICROSCOPIC-ADD ON

## 2015-04-20 LAB — PREGNANCY, URINE: Preg Test, Ur: NEGATIVE

## 2015-04-20 MED ORDER — OXYCODONE-ACETAMINOPHEN 5-325 MG PO TABS: 1.0000 | ORAL_TABLET | Freq: Four times a day (QID) | ORAL | Status: DC | PRN

## 2015-04-20 MED ORDER — ONDANSETRON HCL 4 MG/2ML IJ SOLN
4.0000 mg | Freq: Once | INTRAMUSCULAR | Status: AC
  Administered 2015-04-20: 4 mg via INTRAVENOUS
  Filled 2015-04-20: qty 2

## 2015-04-20 MED ORDER — ONDANSETRON 4 MG PO TBDP: ORAL_TABLET | ORAL | Status: DC

## 2015-04-20 MED ORDER — POTASSIUM CHLORIDE CRYS ER 20 MEQ PO TBCR
40.0000 meq | EXTENDED_RELEASE_TABLET | Freq: Once | ORAL | Status: AC
  Administered 2015-04-20: 40 meq via ORAL
  Filled 2015-04-20: qty 2

## 2015-04-20 MED ORDER — IOHEXOL 300 MG/ML  SOLN
100.0000 mL | Freq: Once | INTRAMUSCULAR | Status: AC | PRN
  Administered 2015-04-20: 100 mL via INTRAVENOUS

## 2015-04-20 MED ORDER — HYDROMORPHONE HCL 4 MG PO TABS: 4.0000 mg | ORAL_TABLET | ORAL | Status: DC | PRN

## 2015-04-20 MED ORDER — BARIUM SULFATE 2 % PO SUSP: 450.0000 mL | Freq: Once | ORAL | Status: DC

## 2015-04-20 MED ORDER — HYDROMORPHONE HCL 1 MG/ML IJ SOLN
1.0000 mg | Freq: Once | INTRAMUSCULAR | Status: AC
  Administered 2015-04-20: 1 mg via INTRAVENOUS
  Filled 2015-04-20: qty 1

## 2015-04-20 NOTE — Discharge Instructions (Signed)
Follow up with your family md next week. °

## 2015-04-20 NOTE — ED Notes (Signed)
Pt reports has had bronchitis for the past 3 weeks and has had pain in left ribs x 4 days.  Reports area sore to touch.  Pt also says had lithotripsy and stone extraction surgery in sept.

## 2015-04-20 NOTE — ED Provider Notes (Signed)
CSN: [id number]     Arrival date & time 04/20/15  1833 History   First MD Initiated Contact with Patient 04/20/15 1854     Chief Complaint  Patient presents with  . rib pain      (Consider location/radiation/quality/duration/timing/severity/associated sxs/prior Treatment) Patient is a 44 y.o. female presenting with abdominal pain. The history is provided by the patient (Patient states that she's had a cough for weeks with yellow sputum production. That has improved some recently. She was put on Z-Pak which she has finished. She complains of left-sided chest pain and left upper quadrant abdominal pain).  Abdominal Pain Pain location: Left upper quadrant. Pain quality: aching   Pain radiates to:  Does not radiate Pain severity:  Moderate Onset quality:  Gradual Timing:  Constant Progression:  Waxing and waning Chronicity:  New Associated symptoms: no chest pain, no cough, no diarrhea, no fatigue and no hematuria     Past Medical History  Diagnosis Date  . Hypertension   . Renal disorder    Past Surgical History  Procedure Laterality Date  . Cholecystectomy    . Kidney stones     Family History  Problem Relation Age of Onset  . Cancer Mother   . Heart failure Mother   . Cancer Father    Social History  Substance Use Topics  . Smoking status: Passive Smoke Exposure - Never Smoker  . Smokeless tobacco: None  . Alcohol Use: No   OB History    No data available     Review of Systems  Constitutional: Negative for appetite change and fatigue.  HENT: Negative for congestion, ear discharge and sinus pressure.   Eyes: Negative for discharge.  Respiratory: Negative for cough.   Cardiovascular: Negative for chest pain.  Gastrointestinal: Positive for abdominal pain. Negative for diarrhea.  Genitourinary: Negative for frequency and hematuria.  Musculoskeletal: Negative for back pain.  Skin: Negative for rash.  Neurological: Negative for seizures and headaches.   Psychiatric/Behavioral: Negative for hallucinations.      Allergies  Morphine and related; Sulfa antibiotics; and Tape  Home Medications   Prior to Admission medications   Medication Sig Start Date End Date Taking? Authorizing Provider  albuterol (PROVENTIL HFA;VENTOLIN HFA) 108 (90 BASE) MCG/ACT inhaler Inhale 1-2 puffs into the lungs every 6 (six) hours as needed for wheezing or shortness of breath.   Yes Historical Provider, MD  ALPRAZolam Duanne Moron) 1 MG tablet Take 1 mg by mouth 3 (three) times daily as needed for anxiety.   Yes Historical Provider, MD  amLODipine (NORVASC) 5 MG tablet Take 10 mg by mouth daily.   Yes Historical Provider, MD  cloNIDine (CATAPRES) 0.1 MG tablet Take 0.2 mg by mouth 2 (two) times daily.   Yes Historical Provider, MD  desvenlafaxine (PRISTIQ) 50 MG 24 hr tablet Take 50 mg by mouth daily.   Yes Historical Provider, MD  doxepin (SINEQUAN) 75 MG capsule Take 75 mg by mouth at bedtime.   Yes Historical Provider, MD  lamoTRIgine (LAMICTAL) 100 MG tablet Take 100 mg by mouth 2 (two) times daily.   Yes Historical Provider, MD  Multiple Vitamin (MULTIVITAMIN WITH MINERALS) TABS tablet Take 1 tablet by mouth daily.   Yes Historical Provider, MD  QUEtiapine (SEROQUEL XR) 300 MG 24 hr tablet Take 300 mg by mouth at bedtime.   Yes Historical Provider, MD  topiramate (TOPAMAX) 50 MG tablet Take 50-100 mg by mouth 2 (two) times daily. 2 tablets in the morning and 1 tablet at  bedtime   Yes Historical Provider, MD  azithromycin (ZITHROMAX) 250 MG tablet Take 250-500 mg by mouth See admin instructions. 04/09/15   Historical Provider, MD  HYDROmorphone (DILAUDID) 4 MG tablet Take 1 tablet (4 mg total) by mouth every 4 (four) hours as needed for severe pain. 04/20/15   Milton Ferguson, MD  ondansetron (ZOFRAN ODT) 4 MG disintegrating tablet 4mg  ODT q4 hours prn nausea/vomit 04/20/15   Milton Ferguson, MD   BP 148/94 mmHg  Pulse 106  Temp(Src) 98 F (36.7 C) (Oral)  Resp 20   Ht 5\' 2"  (1.575 m)  Wt 175 lb (79.379 kg)  BMI 32.00 kg/m2  SpO2 97%  LMP  Physical Exam  Constitutional: She is oriented to person, place, and time. She appears well-developed.  HENT:  Head: Normocephalic.  Eyes: Conjunctivae and EOM are normal. No scleral icterus.  Neck: Neck supple. No thyromegaly present.  Cardiovascular: Normal rate and regular rhythm.  Exam reveals no gallop and no friction rub.   No murmur heard. Pulmonary/Chest: No stridor. She has no wheezes. She has no rales. She exhibits tenderness.  Tender left lateral ribs  Abdominal: She exhibits no distension. There is tenderness. There is no rebound.  Tender left upper quadrant  Musculoskeletal: Normal range of motion. She exhibits no edema.  Lymphadenopathy:    She has no cervical adenopathy.  Neurological: She is oriented to person, place, and time. She exhibits normal muscle tone. Coordination normal.  Skin: No rash noted. No erythema.  Psychiatric: She has a normal mood and affect. Her behavior is normal.    ED Course  Procedures (including critical care time) Labs Review Labs Reviewed  CBC WITH DIFFERENTIAL/PLATELET - Abnormal; Notable for the following:    WBC 12.7 (*)    Lymphs Abs 4.3 (*)    All other components within normal limits  COMPREHENSIVE METABOLIC PANEL - Abnormal; Notable for the following:    Potassium 2.9 (*)    Creatinine, Ser 1.48 (*)    Calcium 8.7 (*)    Alkaline Phosphatase 134 (*)    GFR calc non Af Amer 42 (*)    GFR calc Af Amer 49 (*)    All other components within normal limits  URINALYSIS, ROUTINE W REFLEX MICROSCOPIC (NOT AT [facility]) - Abnormal; Notable for the following:    Hgb urine dipstick SMALL (*)    All other components within normal limits  URINE MICROSCOPIC-ADD ON - Abnormal; Notable for the following:    Squamous Epithelial / LPF 6-30 (*)    Bacteria, UA MANY (*)    All other components within normal limits  PREGNANCY, URINE    Imaging Review Dg Ribs  Unilateral W/chest Left  04/20/2015  CLINICAL DATA:  Cough.  Left rib pain for 3 days. EXAM: LEFT RIBS AND CHEST - 3+ VIEW COMPARISON:  12/23/2014 FINDINGS: No fracture or other bone lesions are seen involving the ribs. There is no evidence of pneumothorax or pleural effusion. Both lungs are clear. Heart size and mediastinal contours are within normal limits. IMPRESSION: Negative. Electronically Signed   By: Earle Gell M.D.   On: 04/20/2015 19:10   Ct Abdomen Pelvis W Contrast  04/20/2015  CLINICAL DATA:  Left-sided rib pain for 4 days. Lithotripsy and stone extraction in September. EXAM: CT ABDOMEN AND PELVIS WITH CONTRAST TECHNIQUE: Multidetector CT imaging of the abdomen and pelvis was performed using the standard protocol following bolus administration of intravenous contrast. CONTRAST:  144mL OMNIPAQUE IOHEXOL 300 MG/ML  SOLN COMPARISON:  02/26/2015 renal ultrasound.  CT of 04/14/2014. FINDINGS: Lower chest: Clear lung bases. Normal heart size. Trace bilateral pleural fluid or thickening, possibly physiologic. Hepatobiliary: Normal liver. Cholecystectomy, without biliary ductal dilatation. Pancreas: Normal, without mass or ductal dilatation. Spleen: Normal in size, without focal abnormality. Adrenals/Urinary Tract: Normal adrenal glands. Moderate left renal atrophy. A too small to characterize interpolar left renal lesion. Punctate right renal collecting system stones. Too small to characterize right renal lesions. No hydronephrosis. Normal ureters and urinary bladder. Stomach/Bowel: Normal stomach, without wall thickening. Colonic stool burden suggests constipation. Normal terminal ileum and appendix. Normal small bowel. Vascular/Lymphatic: Normal caliber of the aorta and branch vessels. No abdominopelvic adenopathy. Reproductive: Normal uterus and adnexa. Other: Trace free pelvic fluid is likely physiologic. Musculoskeletal: No acute osseous abnormality. Disc bulge at the lumbosacral junction.  IMPRESSION: 1.  No acute process in the abdomen or pelvis. 2.  Possible constipation. 3. Right nephrolithiasis. 4. Trace bilateral pleural fluid, possibly physiologic. Electronically Signed   By: Abigail Miyamoto M.D.   On: 04/20/2015 21:37   I have personally reviewed and evaluated these images and lab results as part of my medical decision-making.   EKG Interpretation None      MDM   Final diagnoses:  Bronchitis    Labs show hypokalemia. Also bacteremia possible poor specimen and urine. CT scan and left rib series unremarkable. Suspect musculoskeletal pain from coughing so much. Will treat with pain medicines and Zofran.    Milton Ferguson, MD 04/20/15 2156

## 2015-04-20 NOTE — ED Notes (Signed)
Discharge instructions given, pt demonstrated teach back and verbal understanding. No concerns voiced.  

## 2015-04-23 LAB — URINE CULTURE: Special Requests: NORMAL

## 2015-05-09 DIAGNOSIS — F3132 Bipolar disorder, current episode depressed, moderate: Secondary | ICD-10-CM | POA: Diagnosis not present

## 2015-05-31 DIAGNOSIS — Z1231 Encounter for screening mammogram for malignant neoplasm of breast: Secondary | ICD-10-CM | POA: Diagnosis not present

## 2015-05-31 DIAGNOSIS — R928 Other abnormal and inconclusive findings on diagnostic imaging of breast: Secondary | ICD-10-CM | POA: Diagnosis not present

## 2015-06-08 DIAGNOSIS — K210 Gastro-esophageal reflux disease with esophagitis: Secondary | ICD-10-CM | POA: Diagnosis not present

## 2015-06-11 DIAGNOSIS — R3915 Urgency of urination: Secondary | ICD-10-CM | POA: Diagnosis not present

## 2015-06-11 DIAGNOSIS — N201 Calculus of ureter: Secondary | ICD-10-CM | POA: Diagnosis not present

## 2015-06-19 DIAGNOSIS — Z3042 Encounter for surveillance of injectable contraceptive: Secondary | ICD-10-CM | POA: Diagnosis not present

## 2015-06-19 DIAGNOSIS — Z01419 Encounter for gynecological examination (general) (routine) without abnormal findings: Secondary | ICD-10-CM | POA: Diagnosis not present

## 2015-06-19 DIAGNOSIS — Z3202 Encounter for pregnancy test, result negative: Secondary | ICD-10-CM | POA: Diagnosis not present

## 2015-06-19 DIAGNOSIS — Z6833 Body mass index (BMI) 33.0-33.9, adult: Secondary | ICD-10-CM | POA: Diagnosis not present

## 2015-06-20 DIAGNOSIS — N63 Unspecified lump in breast: Secondary | ICD-10-CM | POA: Diagnosis not present

## 2015-06-20 DIAGNOSIS — D241 Benign neoplasm of right breast: Secondary | ICD-10-CM | POA: Diagnosis not present

## 2015-06-20 DIAGNOSIS — F3131 Bipolar disorder, current episode depressed, mild: Secondary | ICD-10-CM | POA: Diagnosis not present

## 2015-07-23 DIAGNOSIS — E669 Obesity, unspecified: Secondary | ICD-10-CM | POA: Diagnosis not present

## 2015-07-23 DIAGNOSIS — Z6831 Body mass index (BMI) 31.0-31.9, adult: Secondary | ICD-10-CM | POA: Diagnosis not present

## 2015-08-14 DIAGNOSIS — F5000 Anorexia nervosa, unspecified: Secondary | ICD-10-CM | POA: Diagnosis not present

## 2015-08-14 DIAGNOSIS — F3131 Bipolar disorder, current episode depressed, mild: Secondary | ICD-10-CM | POA: Diagnosis not present

## 2015-08-23 DIAGNOSIS — E669 Obesity, unspecified: Secondary | ICD-10-CM | POA: Diagnosis not present

## 2015-08-23 DIAGNOSIS — Z6832 Body mass index (BMI) 32.0-32.9, adult: Secondary | ICD-10-CM | POA: Diagnosis not present

## 2015-09-11 DIAGNOSIS — Z8049 Family history of malignant neoplasm of other genital organs: Secondary | ICD-10-CM | POA: Diagnosis not present

## 2015-09-18 DIAGNOSIS — H35369 Drusen (degenerative) of macula, unspecified eye: Secondary | ICD-10-CM | POA: Diagnosis not present

## 2015-10-18 ENCOUNTER — Emergency Department (HOSPITAL_COMMUNITY): Payer: Medicare Other

## 2015-10-18 ENCOUNTER — Emergency Department (HOSPITAL_COMMUNITY)
Admission: EM | Admit: 2015-10-18 | Discharge: 2015-10-19 | Disposition: A | Discharge status: Home / Self Care | Payer: Medicare Other | Attending: Emergency Medicine | Admitting: Emergency Medicine

## 2015-10-18 ENCOUNTER — Encounter (HOSPITAL_COMMUNITY): Payer: Self-pay

## 2015-10-18 DIAGNOSIS — Z79899 Other long term (current) drug therapy: Secondary | ICD-10-CM | POA: Diagnosis not present

## 2015-10-18 DIAGNOSIS — N23 Unspecified renal colic: Secondary | ICD-10-CM | POA: Diagnosis not present

## 2015-10-18 DIAGNOSIS — N201 Calculus of ureter: Secondary | ICD-10-CM | POA: Diagnosis not present

## 2015-10-18 DIAGNOSIS — E876 Hypokalemia: Secondary | ICD-10-CM | POA: Diagnosis not present

## 2015-10-18 DIAGNOSIS — N132 Hydronephrosis with renal and ureteral calculous obstruction: Secondary | ICD-10-CM | POA: Diagnosis not present

## 2015-10-18 DIAGNOSIS — Z7722 Contact with and (suspected) exposure to environmental tobacco smoke (acute) (chronic): Secondary | ICD-10-CM | POA: Diagnosis not present

## 2015-10-18 DIAGNOSIS — R109 Unspecified abdominal pain: Secondary | ICD-10-CM | POA: Diagnosis present

## 2015-10-18 LAB — URINALYSIS, ROUTINE W REFLEX MICROSCOPIC
Bilirubin Urine: NEGATIVE
GLUCOSE, UA: NEGATIVE mg/dL
Ketones, ur: NEGATIVE mg/dL
LEUKOCYTES UA: NEGATIVE
NITRITE: NEGATIVE
PH: 6 (ref 5.0–8.0)
Protein, ur: 30 mg/dL — AB
SPECIFIC GRAVITY, URINE: 1.025 (ref 1.005–1.030)

## 2015-10-18 LAB — CBC WITH DIFFERENTIAL/PLATELET
BASOS ABS: 0.1 10*3/uL (ref 0.0–0.1)
Basophils Relative: 0 %
Eosinophils Absolute: 0.4 10*3/uL (ref 0.0–0.7)
Eosinophils Relative: 4 %
HEMATOCRIT: 39.7 % (ref 36.0–46.0)
HEMOGLOBIN: 13.4 g/dL (ref 12.0–15.0)
LYMPHS PCT: 29 %
Lymphs Abs: 3.4 10*3/uL (ref 0.7–4.0)
MCH: 30.3 pg (ref 26.0–34.0)
MCHC: 33.8 g/dL (ref 30.0–36.0)
MCV: 89.8 fL (ref 78.0–100.0)
MONO ABS: 0.9 10*3/uL (ref 0.1–1.0)
MONOS PCT: 8 %
NEUTROS ABS: 6.7 10*3/uL (ref 1.7–7.7)
Neutrophils Relative %: 59 %
Platelets: 317 10*3/uL (ref 150–400)
RBC: 4.42 MIL/uL (ref 3.87–5.11)
RDW: 13.8 % (ref 11.5–15.5)
WBC: 11.5 10*3/uL — ABNORMAL HIGH (ref 4.0–10.5)

## 2015-10-18 LAB — URINE MICROSCOPIC-ADD ON

## 2015-10-18 LAB — COMPREHENSIVE METABOLIC PANEL
ALBUMIN: 4 g/dL (ref 3.5–5.0)
ALK PHOS: 104 U/L (ref 38–126)
ALT: 17 U/L (ref 14–54)
AST: 18 U/L (ref 15–41)
Anion gap: 8 (ref 5–15)
BILIRUBIN TOTAL: 0.3 mg/dL (ref 0.3–1.2)
BUN: 18 mg/dL (ref 6–20)
CO2: 22 mmol/L (ref 22–32)
CREATININE: 1.37 mg/dL — AB (ref 0.44–1.00)
Calcium: 8.6 mg/dL — ABNORMAL LOW (ref 8.9–10.3)
Chloride: 107 mmol/L (ref 101–111)
GFR calc Af Amer: 53 mL/min — ABNORMAL LOW (ref >60)
GFR calc non Af Amer: 46 mL/min — ABNORMAL LOW (ref >60)
GLUCOSE: 85 mg/dL (ref 65–99)
Potassium: 2.9 mmol/L — ABNORMAL LOW (ref 3.5–5.1)
Sodium: 137 mmol/L (ref 135–145)
Total Protein: 7.4 g/dL (ref 6.5–8.1)

## 2015-10-18 LAB — PREGNANCY, URINE: Preg Test, Ur: NEGATIVE

## 2015-10-18 LAB — LIPASE, BLOOD: Lipase: 36 U/L (ref 11–51)

## 2015-10-18 MED ORDER — HYDROMORPHONE HCL 1 MG/ML IJ SOLN
1.0000 mg | INTRAMUSCULAR | Status: AC | PRN
  Administered 2015-10-18 – 2015-10-19 (×2): 1 mg via INTRAVENOUS
  Filled 2015-10-18 (×2): qty 1

## 2015-10-18 MED ORDER — SODIUM CHLORIDE 0.9 % IV SOLN
INTRAVENOUS | Status: DC
  Administered 2015-10-18: 125 mL/h via INTRAVENOUS

## 2015-10-18 MED ORDER — ONDANSETRON HCL 4 MG/2ML IJ SOLN
4.0000 mg | INTRAMUSCULAR | Status: DC | PRN
  Administered 2015-10-18: 4 mg via INTRAVENOUS
  Filled 2015-10-18: qty 2

## 2015-10-18 MED ORDER — SODIUM CHLORIDE 0.9 % IV BOLUS (SEPSIS)
1000.0000 mL | Freq: Once | INTRAVENOUS | Status: AC
  Administered 2015-10-18: 1000 mL via INTRAVENOUS

## 2015-10-18 MED ORDER — POTASSIUM CHLORIDE CRYS ER 20 MEQ PO TBCR
40.0000 meq | EXTENDED_RELEASE_TABLET | Freq: Once | ORAL | Status: AC
  Administered 2015-10-18: 40 meq via ORAL
  Filled 2015-10-18: qty 2

## 2015-10-18 MED ORDER — ONDANSETRON HCL 4 MG/2ML IJ SOLN
4.0000 mg | Freq: Once | INTRAMUSCULAR | Status: AC
  Administered 2015-10-18: 4 mg via INTRAVENOUS
  Filled 2015-10-18: qty 2

## 2015-10-18 MED ORDER — HYDROMORPHONE HCL 1 MG/ML IJ SOLN
1.0000 mg | Freq: Once | INTRAMUSCULAR | Status: AC
  Administered 2015-10-18: 1 mg via INTRAVENOUS
  Filled 2015-10-18: qty 1

## 2015-10-18 MED ORDER — DIPHENHYDRAMINE HCL 50 MG/ML IJ SOLN
12.5000 mg | Freq: Once | INTRAMUSCULAR | Status: AC | PRN
  Administered 2015-10-18: 12.5 mg via INTRAVENOUS
  Filled 2015-10-18: qty 1

## 2015-10-18 NOTE — ED Provider Notes (Signed)
CSN: [id number]     Arrival date & time 10/18/15  2056 History   First MD Initiated Contact with Patient 10/18/15 2128     Chief Complaint  Patient presents with  . Flank Pain    HPI Patient presents to the emergency room with complaints of left-sided flank pain that started about a week ago. The symptoms were milder in severity initially. However today the pain became intensely more severe. The pain is very sharp. She's had some difficulty urinating this week and she's noticed small amounts of blood. This afternoon she also had a few episodes of vomiting. Patient does have a history of kidney stones. This does feel similar to that. She denies any diarrhea. No fevers. No chest pain or shortness of breath. Past Medical History  Diagnosis Date  . Hypertension   . Renal disorder    Past Surgical History  Procedure Laterality Date  . Cholecystectomy    . Kidney stones     Family History  Problem Relation Age of Onset  . Cancer Mother   . Heart failure Mother   . Cancer Father    Social History  Substance Use Topics  . Smoking status: Passive Smoke Exposure - Never Smoker  . Smokeless tobacco: None  . Alcohol Use: No   OB History    No data available     Review of Systems  All other systems reviewed and are negative.     Allergies  Morphine and related; Sulfa antibiotics; and Tape  Home Medications   Prior to Admission medications   Medication Sig Start Date End Date Taking? Authorizing Provider  albuterol (PROVENTIL HFA;VENTOLIN HFA) 108 (90 BASE) MCG/ACT inhaler Inhale 1-2 puffs into the lungs every 6 (six) hours as needed for wheezing or shortness of breath.    Historical Provider, MD  ALPRAZolam Duanne Moron) 1 MG tablet Take 1 mg by mouth 3 (three) times daily as needed for anxiety.    Historical Provider, MD  amLODipine (NORVASC) 5 MG tablet Take 10 mg by mouth daily.    Historical Provider, MD  azithromycin (ZITHROMAX) 250 MG tablet Take 250-500 mg by mouth See admin  instructions. 04/09/15   Historical Provider, MD  cloNIDine (CATAPRES) 0.1 MG tablet Take 0.2 mg by mouth 2 (two) times daily.    Historical Provider, MD  desvenlafaxine (PRISTIQ) 50 MG 24 hr tablet Take 50 mg by mouth daily.    Historical Provider, MD  doxepin (SINEQUAN) 75 MG capsule Take 75 mg by mouth at bedtime.    Historical Provider, MD  HYDROmorphone (DILAUDID) 4 MG tablet Take 1 tablet (4 mg total) by mouth every 4 (four) hours as needed for severe pain. 04/20/15   Milton Ferguson, MD  lamoTRIgine (LAMICTAL) 100 MG tablet Take 100 mg by mouth 2 (two) times daily.    Historical Provider, MD  Multiple Vitamin (MULTIVITAMIN WITH MINERALS) TABS tablet Take 1 tablet by mouth daily.    Historical Provider, MD  ondansetron (ZOFRAN ODT) 4 MG disintegrating tablet 4mg  ODT q4 hours prn nausea/vomit 04/20/15   Milton Ferguson, MD  oxyCODONE-acetaminophen (PERCOCET/ROXICET) 5-325 MG tablet Take 1 tablet by mouth every 6 (six) hours as needed. 04/20/15   Milton Ferguson, MD  QUEtiapine (SEROQUEL XR) 300 MG 24 hr tablet Take 300 mg by mouth at bedtime.    Historical Provider, MD  topiramate (TOPAMAX) 50 MG tablet Take 50-100 mg by mouth 2 (two) times daily. 2 tablets in the morning and 1 tablet at bedtime    Historical  Provider, MD   BP 161/96 mmHg  Pulse 127  Temp(Src) 98.7 F (37.1 C) (Oral)  Resp 22  Ht 5\' 2"  (1.575 m)  Wt 82.555 kg  BMI 33.28 kg/m2  SpO2 100% Physical Exam  Constitutional: She appears well-developed and well-nourished.  Uncomfortable appearing   HENT:  Head: Normocephalic and atraumatic.  Right Ear: External ear normal.  Left Ear: External ear normal.  Eyes: Conjunctivae are normal. Right eye exhibits no discharge. Left eye exhibits no discharge. No scleral icterus.  Neck: Neck supple. No tracheal deviation present.  Cardiovascular: Normal rate, regular rhythm and intact distal pulses.   Pulmonary/Chest: Effort normal and breath sounds normal. No stridor. No respiratory  distress. She has no wheezes. She has no rales.  Abdominal: Soft. Bowel sounds are normal. She exhibits no distension. There is tenderness. There is CVA tenderness (left). There is no rebound and no guarding.  Musculoskeletal: She exhibits no edema or tenderness.  Neurological: She is alert. She has normal strength. No cranial nerve deficit (no facial droop, extraocular movements intact, no slurred speech) or sensory deficit. She exhibits normal muscle tone. She displays no seizure activity. Coordination normal.  Skin: Skin is warm and dry. No rash noted.  Psychiatric: She has a normal mood and affect.  Nursing note and vitals reviewed.   ED Course  Procedures (including critical care time) Labs Review Labs Reviewed  URINALYSIS, ROUTINE W REFLEX MICROSCOPIC (NOT AT [facility])  COMPREHENSIVE METABOLIC PANEL  LIPASE, BLOOD  CBC WITH DIFFERENTIAL/PLATELET      MDM   Pt presents to the ED with complaints of flank pain and vomiting.  Hx of kidney stones.  Pain is similar.  Will check labs, CT scan.  IV pain meds.  Morphine allergy is itching.  Will give hydromorphone which she has had before.  Case will be turned over to Dr Marney Doctor, MD 10/18/15 [phone]

## 2015-10-18 NOTE — ED Notes (Signed)
Pt reports that she has been experiencing mild left flank pain for a week. Pain worsened today at lunch. Noticed some blood in urine this week, today having difficulty urniating. Couple of episodes of vomiting

## 2015-10-18 NOTE — ED Provider Notes (Signed)
Pt received at sign out with workup pending. Pt c/o left sided flank pain,hematuria, N/V. Endorses hx of kidney stones. No clear UTI on Udip. BUN/Cr elevated per baseline. Potassium repleted PO. Pt has been ambulatory with steady gait, easy resps. Pt continues to c/o pain and nausea; will re-medicate and reassess. CT scan with hydronephrosis due to ureteral calculi in atrophic kidney. 2345:  T/C to Uro Dr. Junious Silk, case discussed, including:  HPI, pertinent PM/SHx, VS/PE, dx testing, ED course and treatment:  Agrees no clear UTI on Udip, no different treatment needed (tx acute ureteral colic per usual), pt should easily pass stone, make pt aware of atrophic kidney, will need outpatient f/u. 0020: Needs further pain control before d/c; sign out to Dr. Leonides Schanz.    BP 132/78 mmHg  Pulse 109  Temp(Src) 98.7 F (37.1 C) (Oral)  Resp 18  Ht 5\' 2"  (1.575 m)  Wt 182 lb (82.555 kg)  BMI 33.28 kg/m2  SpO2 99%   Results for orders placed or performed during the hospital encounter of 10/18/15  Urinalysis, Routine w reflex microscopic (not at [facility])  Result Value Ref Range   Color, Urine YELLOW YELLOW   APPearance CLEAR CLEAR   Specific Gravity, Urine 1.025 1.005 - 1.030   pH 6.0 5.0 - 8.0   Glucose, UA NEGATIVE NEGATIVE mg/dL   Hgb urine dipstick MODERATE (A) NEGATIVE   Bilirubin Urine NEGATIVE NEGATIVE   Ketones, ur NEGATIVE NEGATIVE mg/dL   Protein, ur 30 (A) NEGATIVE mg/dL   Nitrite NEGATIVE NEGATIVE   Leukocytes, UA NEGATIVE NEGATIVE  Comprehensive metabolic panel  Result Value Ref Range   Sodium 137 135 - 145 mmol/L   Potassium 2.9 (L) 3.5 - 5.1 mmol/L   Chloride 107 101 - 111 mmol/L   CO2 22 22 - 32 mmol/L   Glucose, Bld 85 65 - 99 mg/dL   BUN 18 6 - 20 mg/dL   Creatinine, Ser 1.37 (H) 0.44 - 1.00 mg/dL   Calcium 8.6 (L) 8.9 - 10.3 mg/dL   Total Protein 7.4 6.5 - 8.1 g/dL   Albumin 4.0 3.5 - 5.0 g/dL   AST 18 15 - 41 U/L   ALT 17 14 - 54 U/L   Alkaline Phosphatase 104 38 - 126 U/L    Total Bilirubin 0.3 0.3 - 1.2 mg/dL   GFR calc non Af Amer 46 (L) >60 mL/min   GFR calc Af Amer 53 (L) >60 mL/min   Anion gap 8 5 - 15  Lipase, blood  Result Value Ref Range   Lipase 36 11 - 51 U/L  CBC WITH DIFFERENTIAL  Result Value Ref Range   WBC 11.5 (H) 4.0 - 10.5 K/uL   RBC 4.42 3.87 - 5.11 MIL/uL   Hemoglobin 13.4 12.0 - 15.0 g/dL   HCT 39.7 36.0 - 46.0 %   MCV 89.8 78.0 - 100.0 fL   MCH 30.3 26.0 - 34.0 pg   MCHC 33.8 30.0 - 36.0 g/dL   RDW 13.8 11.5 - 15.5 %   Platelets 317 150 - 400 K/uL   Neutrophils Relative % 59 %   Neutro Abs 6.7 1.7 - 7.7 K/uL   Lymphocytes Relative 29 %   Lymphs Abs 3.4 0.7 - 4.0 K/uL   Monocytes Relative 8 %   Monocytes Absolute 0.9 0.1 - 1.0 K/uL   Eosinophils Relative 4 %   Eosinophils Absolute 0.4 0.0 - 0.7 K/uL   Basophils Relative 0 %   Basophils Absolute 0.1 0.0 - 0.1  K/uL  Pregnancy, urine  Result Value Ref Range   Preg Test, Ur NEGATIVE NEGATIVE  Urine microscopic-add on  Result Value Ref Range   Squamous Epithelial / LPF 6-30 (A) NONE SEEN   WBC, UA 0-5 0 - 5 WBC/hpf   RBC / HPF 6-30 0 - 5 RBC/hpf   Bacteria, UA MANY (A) NONE SEEN   Crystals CA OXALATE CRYSTALS (A) NEGATIVE   Ct Renal Stone Study 10/18/2015  CLINICAL DATA:  LEFT side flank pain beginning 1 week ago, initially mild, today more severe, sharp, associated with difficulty urinating and small amounts of blood within urine, few episodes of vomiting this afternoon, history kidney stones, hypertension EXAM: CT ABDOMEN AND PELVIS WITHOUT CONTRAST TECHNIQUE: Multidetector CT imaging of the abdomen and pelvis was performed following the standard protocol without IV contrast. Multidetector CT imaging of the abdomen and pelvis was performed following the standard protocol without IV contrast. Sagittal and coronal MPR images reconstructed from axial data set. Oral contrast not administered for this indication. COMPARISON:  04/20/2015 FINDINGS: Lower chest:  Lung bases clear.  Hepatobiliary: Post cholecystectomy. Liver unremarkable. No obvious biliary dilatation. Pancreas: Normal appearance Spleen: Normal appearance Adrenals/Urinary Tract: Normal adrenal glands. Atrophic LEFT kidney versus RIGHT. New LEFT hydronephrosis and hydroureter secondary to a 1-2 mm diameter distal LEFT ureteral calculus image 72. Bladder decompressed. No additional urinary tract calcifications, RIGHT hydronephrosis or RIGHT hydroureter. Stomach/Bowel: Normal appendix. Stomach and bowel loops unremarkable for technique. Vascular/Lymphatic: No adenopathy. Reproductive: Normal appearing uterus and adnexa Other: No mass, free air, free fluid, or hernia. Musculoskeletal: Osseous structures unremarkable IMPRESSION: Mild LEFT hydronephrosis and hydroureter secondary to a 1-2 mm distal LEFT ureteral calculus. Atrophic LEFT kidney versus RIGHT. Post cholecystectomy. Electronically Signed   By: Lavonia Dana M.D.   On: 10/18/2015 22:59    Results for RUNETTE, SONTAG (MRN [id number]) as of 10/18/2015 23:33  Ref. Range 01/30/2009 13:00 08/20/2009 10:57 04/20/2015 19:27 10/18/2015 22:10  BUN Latest Ref Range: 6-20 mg/dL 8 13 20 18   Creatinine Latest Ref Range: 0.44-1.00 mg/dL 1.12 0.81 1.48 (H) 1.37 (H)    Francine Graven, DO 10/19/15 [id number]

## 2015-10-19 MED ORDER — POTASSIUM CHLORIDE ER 20 MEQ PO TBCR: 20.0000 meq | EXTENDED_RELEASE_TABLET | Freq: Two times a day (BID) | ORAL | Status: DC

## 2015-10-19 MED ORDER — DIPHENHYDRAMINE HCL 25 MG PO CAPS
50.0000 mg | ORAL_CAPSULE | Freq: Once | ORAL | Status: AC
  Administered 2015-10-19: 50 mg via ORAL
  Filled 2015-10-19: qty 2

## 2015-10-19 MED ORDER — OXYCODONE-ACETAMINOPHEN 5-325 MG PO TABS
1.0000 | ORAL_TABLET | Freq: Four times a day (QID) | ORAL | Status: DC | PRN
Start: 2015-10-19 — End: 2016-03-13

## 2015-10-19 MED ORDER — ONDANSETRON 4 MG PO TBDP: 4.0000 mg | ORAL_TABLET | Freq: Three times a day (TID) | ORAL | Status: DC | PRN

## 2015-10-19 NOTE — ED Provider Notes (Addendum)
1:30 AM  Assumed care from Dr. Thurnell Garbe.  Pt is a 45 y.o. female who presents emergency room left sided flank pain, hematuria, nausea and vomiting. CT scan shows a 1-2 mm distal left ureteral stone. She does have atrophic kidneys and slightly elevated creatinine. She has received IV fluids. Urology has been consulted.  They do not feel anything emergent needs to be done and she can be followed as an outpatient. Will avoid NSAIDs given her mild acute renal failure. Pain is been well controlled with Dilaudid. We'll discharge with Percocet, Flomax, Zofran. We'll get outpatient neurology follow-up information. Urine does not show any urinary tract infection.  [place], DO 10/19/15 0139    Addendum:  I have not discharge patient with Flomax given she has a sulfa allergy. She also had a mild hypokalemia. We'll sent home with placement. We'll have her PCP recheck her creatinine and potassium in one week.  [place], DO 10/19/15 0140

## 2015-10-19 NOTE — Discharge Instructions (Signed)
You will need to have your creatinine which is your kidney function and potassium rechecked by your PCP in one week.    Dietary Guidelines to Help Prevent Kidney Stones Your risk of kidney stones can be decreased by adjusting the foods you eat. The most important thing you can do is drink enough fluid. You should drink enough fluid to keep your urine clear or pale yellow. The following guidelines provide specific information for the type of kidney stone you have had. GUIDELINES ACCORDING TO TYPE OF KIDNEY STONE Calcium Oxalate Kidney Stones  Reduce the amount of salt you eat. Foods that have a lot of salt cause your body to release excess calcium into your urine. The excess calcium can combine with a substance called oxalate to form kidney stones.  Reduce the amount of animal protein you eat if the amount you eat is excessive. Animal protein causes your body to release excess calcium into your urine. Ask your dietitian how much protein from animal sources you should be eating.  Avoid foods that are high in oxalates. If you take vitamins, they should have less than 500 mg of vitamin C. Your body turns vitamin C into oxalates. You do not need to avoid fruits and vegetables high in vitamin C. Calcium Phosphate Kidney Stones  Reduce the amount of salt you eat to help prevent the release of excess calcium into your urine.  Reduce the amount of animal protein you eat if the amount you eat is excessive. Animal protein causes your body to release excess calcium into your urine. Ask your dietitian how much protein from animal sources you should be eating.  Get enough calcium from food or take a calcium supplement (ask your dietitian for recommendations). Food sources of calcium that do not increase your risk of kidney stones include:  Broccoli.  Dairy products, such as cheese and yogurt.  Pudding. Uric Acid Kidney Stones  Do not have more than 6 oz of animal protein per day. FOOD SOURCES Animal  Protein Sources  Meat (all types).  Poultry.  Eggs.  Fish, seafood. Foods High in [employer] seasonings.  Soy sauce.  Teriyaki sauce.  Cured and processed meats.  Salted crackers and snack foods.  Fast food.  Canned soups and most canned foods. Foods High in Oxalates  Grains:  Amaranth.  Barley.  Grits.  Wheat germ.  Bran.  Buckwheat flour.  All bran cereals.  Pretzels.  Whole wheat bread.  Vegetables:  Beans (wax).  Beets and beet greens.  Collard greens.  Eggplant.  [place].  Leeks.  Okra.  Parsley.  Rutabagas.  Spinach.  Swiss chard.  Tomato paste.  Fried potatoes.  Sweet potatoes.  Fruits:  Red currants.  Figs.  Kiwi.  Rhubarb.  Meat and Other Protein Sources:  Beans (dried).  Soy burgers and other soybean products.  Miso.  Nuts (peanuts, almonds, pecans, cashews, hazelnuts).  Nut butters.  Sesame seeds and tahini (paste made of sesame seeds).  Poppy seeds.  Beverages:  Chocolate drink mixes.  Soy milk.  Instant iced tea.  Juices made from high-oxalate fruits or vegetables.  Other:  Carob.  Chocolate.  Fruitcake.  Marmalades.   This information is not intended to replace advice given to you by your health care provider. Make sure you discuss any questions you have with your health care provider.   Document Released: 09/13/2010 Document Revised: 05/24/2013 Document Reviewed: 04/15/2013 Elsevier Interactive Patient Education 2016 Elsevier Inc.  Kidney Stones Kidney stones (urolithiasis) are deposits  that form inside your kidneys. The intense pain is caused by the stone moving through the urinary tract. When the stone moves, the ureter goes into spasm around the stone. The stone is usually passed in the urine.  CAUSES   A disorder that makes certain neck glands produce too much parathyroid hormone (primary hyperparathyroidism).  A buildup of uric acid crystals, similar to gout in  your joints.  Narrowing (stricture) of the ureter.  A kidney obstruction present at birth (congenital obstruction).  Previous surgery on the kidney or ureters.  Numerous kidney infections. SYMPTOMS   Feeling sick to your stomach (nauseous).  Throwing up (vomiting).  Blood in the urine (hematuria).  Pain that usually spreads (radiates) to the groin.  Frequency or urgency of urination. DIAGNOSIS   Taking a history and physical exam.  Blood or urine tests.  CT scan.  Occasionally, an examination of the inside of the urinary bladder (cystoscopy) is performed. TREATMENT   Observation.  Increasing your fluid intake.  Extracorporeal shock wave lithotripsy--This is a noninvasive procedure that uses shock waves to break up kidney stones.  Surgery may be needed if you have severe pain or persistent obstruction. There are various surgical procedures. Most of the procedures are performed with the use of small instruments. Only small incisions are needed to accommodate these instruments, so recovery time is minimized. The size, location, and chemical composition are all important variables that will determine the proper choice of action for you. Talk to your health care provider to better understand your situation so that you will minimize the risk of injury to yourself and your kidney.  HOME CARE INSTRUCTIONS   Drink enough water and fluids to keep your urine clear or pale yellow. This will help you to pass the stone or stone fragments.  Strain all urine through the provided strainer. Keep all particulate matter and stones for your health care provider to see. The stone causing the pain may be as small as a grain of salt. It is very important to use the strainer each and every time you pass your urine. The collection of your stone will allow your health care provider to analyze it and verify that a stone has actually passed. The stone analysis will often identify what you can do to  reduce the incidence of recurrences.  Only take over-the-counter or prescription medicines for pain, discomfort, or fever as directed by your health care provider.  Keep all follow-up visits as told by your health care provider. This is important.  Get follow-up X-rays if required. The absence of pain does not always mean that the stone has passed. It may have only stopped moving. If the urine remains completely obstructed, it can cause loss of kidney function or even complete destruction of the kidney. It is your responsibility to make sure X-rays and follow-ups are completed. Ultrasounds of the kidney can show blockages and the status of the kidney. Ultrasounds are not associated with any radiation and can be performed easily in a matter of minutes.  Make changes to your daily diet as told by your health care provider. You may be told to:  Limit the amount of salt that you eat.  Eat 5 or more servings of fruits and vegetables each day.  Limit the amount of meat, poultry, fish, and eggs that you eat.  Collect a 24-hour urine sample as told by your health care provider.You may need to collect another urine sample every 6-12 months. SEEK MEDICAL CARE IF:  You experience pain that is progressive and unresponsive to any pain medicine you have been prescribed. SEEK IMMEDIATE MEDICAL CARE IF:   Pain cannot be controlled with the prescribed medicine.  You have a fever or shaking chills.  The severity or intensity of pain increases over 18 hours and is not relieved by pain medicine.  You develop a new onset of abdominal pain.  You feel faint or pass out.  You are unable to urinate.   This information is not intended to replace advice given to you by your health care provider. Make sure you discuss any questions you have with your health care provider.   Document Released: 05/19/2005 Document Revised: 02/07/2015 Document Reviewed: 10/20/2012 Elsevier Interactive Patient Education 2016  [employer].  Hypokalemia Hypokalemia means that the amount of potassium in the blood is lower than normal.Potassium is a chemical, called an electrolyte, that helps regulate the amount of fluid in the body. It also stimulates muscle contraction and helps nerves function properly.Most of the body's potassium is inside of cells, and only a very small amount is in the blood. Because the amount in the blood is so small, minor changes can be life-threatening. CAUSES  Antibiotics.  Diarrhea or vomiting.  Using laxatives too much, which can cause diarrhea.  Chronic kidney disease.  Water pills (diuretics).  Eating disorders (bulimia).  Low magnesium level.  Sweating a lot. SIGNS AND SYMPTOMS  Weakness.  Constipation.  Fatigue.  Muscle cramps.  Mental confusion.  Skipped heartbeats or irregular heartbeat (palpitations).  Tingling or numbness. DIAGNOSIS  Your health care provider can diagnose hypokalemia with blood tests. In addition to checking your potassium level, your health care provider may also check other lab tests. TREATMENT Hypokalemia can be treated with potassium supplements taken by mouth or adjustments in your current medicines. If your potassium level is very low, you may need to get potassium through a vein (IV) and be monitored in the hospital. A diet high in potassium is also helpful. Foods high in potassium are:  Nuts, such as peanuts and pistachios.  Seeds, such as sunflower seeds and pumpkin seeds.  Peas, lentils, and lima beans.  Whole grain and bran cereals and breads.  Fresh fruit and vegetables, such as apricots, avocado, bananas, cantaloupe, kiwi, oranges, tomatoes, asparagus, and potatoes.  Orange and tomato juices.  Red meats.  Fruit yogurt. HOME CARE INSTRUCTIONS  Take all medicines as prescribed by your health care provider.  Maintain a healthy diet by including nutritious food, such as fruits, vegetables, nuts, whole grains, and  lean meats.  If you are taking a laxative, be sure to follow the directions on the label. SEEK MEDICAL CARE IF:  Your weakness gets worse.  You feel your heart pounding or racing.  You are vomiting or having diarrhea.  You are diabetic and having trouble keeping your blood glucose in the normal range. SEEK IMMEDIATE MEDICAL CARE IF:  You have chest pain, shortness of breath, or dizziness.  You are vomiting or having diarrhea for more than 2 days.  You faint. MAKE SURE YOU:   Understand these instructions.  Will watch your condition.  Will get help right away if you are not doing well or get worse.   This information is not intended to replace advice given to you by your health care provider. Make sure you discuss any questions you have with your health care provider.   Document Released: 05/19/2005 Document Revised: 06/09/2014 Document Reviewed: 11/19/2012 Elsevier Interactive Patient Education 2016 Elsevier  Inc. ° °

## 2015-10-20 LAB — URINE CULTURE: Culture: <10000 — AB

## 2015-11-08 DIAGNOSIS — F3132 Bipolar disorder, current episode depressed, moderate: Secondary | ICD-10-CM | POA: Diagnosis not present

## 2015-11-22 DIAGNOSIS — Z3042 Encounter for surveillance of injectable contraceptive: Secondary | ICD-10-CM | POA: Diagnosis not present

## 2015-11-22 DIAGNOSIS — Z308 Encounter for other contraceptive management: Secondary | ICD-10-CM | POA: Diagnosis not present

## 2015-12-12 DIAGNOSIS — Z6835 Body mass index (BMI) 35.0-35.9, adult: Secondary | ICD-10-CM | POA: Diagnosis not present

## 2015-12-12 DIAGNOSIS — K625 Hemorrhage of anus and rectum: Secondary | ICD-10-CM | POA: Diagnosis not present

## 2015-12-12 DIAGNOSIS — R195 Other fecal abnormalities: Secondary | ICD-10-CM | POA: Diagnosis not present

## 2015-12-12 DIAGNOSIS — E876 Hypokalemia: Secondary | ICD-10-CM | POA: Diagnosis not present

## 2016-01-07 DIAGNOSIS — F3131 Bipolar disorder, current episode depressed, mild: Secondary | ICD-10-CM | POA: Diagnosis not present

## 2016-01-09 ENCOUNTER — Encounter (INDEPENDENT_AMBULATORY_CARE_PROVIDER_SITE_OTHER): Payer: Self-pay | Admitting: Internal Medicine

## 2016-02-13 DIAGNOSIS — Z308 Encounter for other contraceptive management: Secondary | ICD-10-CM | POA: Diagnosis not present

## 2016-03-05 DIAGNOSIS — R05 Cough: Secondary | ICD-10-CM | POA: Diagnosis not present

## 2016-03-05 DIAGNOSIS — J0101 Acute recurrent maxillary sinusitis: Secondary | ICD-10-CM | POA: Diagnosis not present

## 2016-03-05 DIAGNOSIS — Z6835 Body mass index (BMI) 35.0-35.9, adult: Secondary | ICD-10-CM | POA: Diagnosis not present

## 2016-03-13 ENCOUNTER — Emergency Department (HOSPITAL_COMMUNITY): Payer: Medicare Other

## 2016-03-13 ENCOUNTER — Encounter (HOSPITAL_COMMUNITY): Payer: Self-pay | Admitting: Emergency Medicine

## 2016-03-13 ENCOUNTER — Emergency Department (HOSPITAL_COMMUNITY)
Admission: EM | Admit: 2016-03-13 | Discharge: 2016-03-13 | Disposition: A | Discharge status: Home / Self Care | Payer: Medicare Other | Attending: Emergency Medicine | Admitting: Emergency Medicine

## 2016-03-13 DIAGNOSIS — N289 Disorder of kidney and ureter, unspecified: Secondary | ICD-10-CM | POA: Diagnosis not present

## 2016-03-13 DIAGNOSIS — N200 Calculus of kidney: Secondary | ICD-10-CM | POA: Diagnosis not present

## 2016-03-13 DIAGNOSIS — Z79899 Other long term (current) drug therapy: Secondary | ICD-10-CM | POA: Insufficient documentation

## 2016-03-13 DIAGNOSIS — Z7722 Contact with and (suspected) exposure to environmental tobacco smoke (acute) (chronic): Secondary | ICD-10-CM | POA: Insufficient documentation

## 2016-03-13 DIAGNOSIS — N23 Unspecified renal colic: Secondary | ICD-10-CM

## 2016-03-13 DIAGNOSIS — I10 Essential (primary) hypertension: Secondary | ICD-10-CM | POA: Diagnosis not present

## 2016-03-13 DIAGNOSIS — R109 Unspecified abdominal pain: Secondary | ICD-10-CM | POA: Diagnosis not present

## 2016-03-13 LAB — CBC
HEMATOCRIT: 40.8 % (ref 36.0–46.0)
HEMOGLOBIN: 13.5 g/dL (ref 12.0–15.0)
MCH: 29.7 pg (ref 26.0–34.0)
MCHC: 33.1 g/dL (ref 30.0–36.0)
MCV: 89.7 fL (ref 78.0–100.0)
Platelets: 302 10*3/uL (ref 150–400)
RBC: 4.55 MIL/uL (ref 3.87–5.11)
RDW: 14.6 % (ref 11.5–15.5)
WBC: 10.7 10*3/uL — ABNORMAL HIGH (ref 4.0–10.5)

## 2016-03-13 LAB — URINE MICROSCOPIC-ADD ON

## 2016-03-13 LAB — BASIC METABOLIC PANEL WITH GFR
Anion gap: 8 (ref 5–15)
BUN: 13 mg/dL (ref 6–20)
CO2: 17 mmol/L — ABNORMAL LOW (ref 22–32)
Calcium: 8.8 mg/dL — ABNORMAL LOW (ref 8.9–10.3)
Chloride: 114 mmol/L — ABNORMAL HIGH (ref 101–111)
Creatinine, Ser: 1.42 mg/dL — ABNORMAL HIGH (ref 0.44–1.00)
GFR calc Af Amer: 51 mL/min — ABNORMAL LOW
GFR calc non Af Amer: 44 mL/min — ABNORMAL LOW
Glucose, Bld: 83 mg/dL (ref 65–99)
Potassium: 3.4 mmol/L — ABNORMAL LOW (ref 3.5–5.1)
Sodium: 139 mmol/L (ref 135–145)

## 2016-03-13 LAB — URINALYSIS, ROUTINE W REFLEX MICROSCOPIC
BILIRUBIN URINE: NEGATIVE
Glucose, UA: NEGATIVE mg/dL
Ketones, ur: NEGATIVE mg/dL
Leukocytes, UA: NEGATIVE
Nitrite: NEGATIVE
PH: 6 (ref 5.0–8.0)

## 2016-03-13 LAB — POC URINE PREG, ED: Preg Test, Ur: NEGATIVE

## 2016-03-13 MED ORDER — DIPHENHYDRAMINE HCL 25 MG PO CAPS
25.0000 mg | ORAL_CAPSULE | Freq: Once | ORAL | Status: AC
  Administered 2016-03-13: 25 mg via ORAL
  Filled 2016-03-13: qty 1

## 2016-03-13 MED ORDER — HYDROMORPHONE HCL 1 MG/ML IJ SOLN
1.0000 mg | Freq: Once | INTRAMUSCULAR | Status: AC
  Administered 2016-03-13: 1 mg via INTRAVENOUS
  Filled 2016-03-13: qty 1

## 2016-03-13 MED ORDER — OXYCODONE-ACETAMINOPHEN 5-325 MG PO TABS: 1.0000 | ORAL_TABLET | ORAL | 0 refills | Status: DC | PRN

## 2016-03-13 MED ORDER — ONDANSETRON HCL 4 MG/2ML IJ SOLN
4.0000 mg | Freq: Once | INTRAMUSCULAR | Status: AC
  Administered 2016-03-13: 4 mg via INTRAVENOUS
  Filled 2016-03-13: qty 2

## 2016-03-13 MED ORDER — ONDANSETRON 8 MG PO TBDP: 8.0000 mg | ORAL_TABLET | Freq: Three times a day (TID) | ORAL | 0 refills | Status: DC | PRN

## 2016-03-13 MED ORDER — SODIUM CHLORIDE 0.9 % IV BOLUS (SEPSIS)
1000.0000 mL | Freq: Once | INTRAVENOUS | Status: AC
  Administered 2016-03-13: 1000 mL via INTRAVENOUS

## 2016-03-13 MED ORDER — FLUCONAZOLE 100 MG PO TABS
150.0000 mg | ORAL_TABLET | Freq: Once | ORAL | Status: AC
Start: 2016-03-13 — End: 2016-03-13
  Administered 2016-03-13: 150 mg via ORAL
  Filled 2016-03-13: qty 2

## 2016-03-13 NOTE — ED Provider Notes (Signed)
[facility] DEPT Provider Note   CSN: [id number] Arrival date & time: 03/13/16  1431     History   Chief Complaint Chief Complaint  Patient presents with  . Flank Pain    HPI [name] is a 45 y.o. female with past medical history of HTN and renal disorder including kidney stones requiring stenting and basket procedures (Dr. Michela Pitcher) present with left flank pain which has been intermittent since last night in association with increased urinary frequency and hematuria.  She denies fevers, abdominal pain, vomiting but endorses nausea, especially when the pain intensifies.  She has found no alleviators for her symptoms.  The history is provided by the patient.    Past Medical History:  Diagnosis Date  . Hypertension   . Renal disorder     There are no active problems to display for this patient.   Past Surgical History:  Procedure Laterality Date  . CHOLECYSTECTOMY    . KIDNEY STONE SURGERY    . kidney stones      OB History    Gravida Para Term Preterm AB Living             0   SAB TAB Ectopic Multiple Live Births                   Home Medications    Prior to Admission medications   Medication Sig Start Date End Date Taking? Authorizing Provider  albuterol (PROVENTIL HFA;VENTOLIN HFA) 108 (90 BASE) MCG/ACT inhaler Inhale 1-2 puffs into the lungs every 6 (six) hours as needed for wheezing or shortness of breath.    Historical Provider, MD  ALPRAZolam Duanne Moron) 1 MG tablet Take 1-2 mg by mouth 2 (two) times daily. 1mg  in the morning and 2mg  at bedtime    Historical Provider, MD  amLODipine (NORVASC) 5 MG tablet Take 10 mg by mouth daily.    Historical Provider, MD  cloNIDine (CATAPRES) 0.1 MG tablet Take 0.2 mg by mouth 2 (two) times daily.    Historical Provider, MD  desvenlafaxine (PRISTIQ) 50 MG 24 hr tablet Take 50 mg by mouth daily.    Historical Provider, MD  doxepin (SINEQUAN) 75 MG capsule Take 150 mg by mouth at bedtime.     Historical Provider, MD    lamoTRIgine (LAMICTAL) 100 MG tablet Take 100-200 mg by mouth 2 (two) times daily. `100mg  in the morning and 200mg  at bedtime    Historical Provider, MD  medroxyPROGESTERone (DEPO-PROVERA) 150 MG/ML injection Inject 150 mg into the muscle every 3 (three) months.    Historical Provider, MD  Multiple Vitamin (MULTIVITAMIN WITH MINERALS) TABS tablet Take 1 tablet by mouth daily.    Historical Provider, MD  ondansetron (ZOFRAN ODT) 8 MG disintegrating tablet Take 1 tablet (8 mg total) by mouth every 8 (eight) hours as needed for nausea or vomiting. 03/13/16   Evalee Jefferson, PA-C  oxyCODONE-acetaminophen (PERCOCET/ROXICET) 5-325 MG tablet Take 1 tablet by mouth every 4 (four) hours as needed. 03/13/16   Evalee Jefferson, PA-C  oxyCODONE-acetaminophen (PERCOCET/ROXICET) 5-325 MG tablet Take 1 tablet by mouth every 4 (four) hours as needed for severe pain. 03/13/16   Evalee Jefferson, PA-C  potassium chloride (K-DUR) 10 MEQ tablet Take 10 mEq by mouth 2 (two) times daily.    Historical Provider, MD  potassium chloride 20 MEQ TBCR Take 20 mEq by mouth 2 (two) times daily. 10/19/15   Kristen N Ward, DO  QUEtiapine (SEROQUEL XR) 300 MG 24 hr tablet Take 300 mg  by mouth at bedtime.    Historical Provider, MD  topiramate (TOPAMAX) 50 MG tablet Take 100 mg by mouth 2 (two) times daily.     Historical Provider, MD    Family History Family History  Problem Relation Age of Onset  . Cancer Mother   . Heart failure Mother   . Cancer Father     Social History Social History  Substance Use Topics  . Smoking status: Passive Smoke Exposure - Never Smoker  . Smokeless tobacco: Never Used  . Alcohol use No     Allergies   Morphine and related; Sulfa antibiotics; and Tape   Review of Systems Review of Systems  Constitutional: Negative for chills and fever.  HENT: Negative for congestion and sore throat.   Eyes: Negative.   Respiratory: Negative for chest tightness and shortness of breath.   Cardiovascular: Negative  for chest pain.  Gastrointestinal: Positive for nausea. Negative for abdominal pain and vomiting.  Genitourinary: Positive for flank pain, frequency and hematuria. Negative for vaginal discharge.  Musculoskeletal: Negative for arthralgias, joint swelling and neck pain.  Skin: Negative.  Negative for rash and wound.  Neurological: Negative for dizziness, weakness, light-headedness, numbness and headaches.  Psychiatric/Behavioral: Negative.      Physical Exam Updated Vital Signs BP 126/72 (BP Location: Right Arm)   Pulse 98   Temp 98.2 F (36.8 C) (Oral)   Resp 16   Ht 5\' 2"  (1.575 m)   Wt 85.7 kg   SpO2 100%   BMI 34.57 kg/m   Physical Exam  Constitutional: She appears well-developed and well-nourished.  HENT:  Head: Normocephalic and atraumatic.  Eyes: Conjunctivae are normal.  Neck: Normal range of motion.  Cardiovascular: Normal rate, regular rhythm, normal heart sounds and intact distal pulses.   Pulmonary/Chest: Effort normal and breath sounds normal. She has no wheezes.  Abdominal: Soft. Bowel sounds are normal. She exhibits no mass. There is no guarding.  No reproducible cva tenderness.  Musculoskeletal: Normal range of motion.  Neurological: She is alert.  Skin: Skin is warm and dry.  Psychiatric: She has a normal mood and affect.  Nursing note and vitals reviewed.    ED Treatments / Results  Labs (all labs ordered are listed, but only abnormal results are displayed) Labs Reviewed  CBC - Abnormal; Notable for the following:       Result Value   WBC 10.7 (*)    All other components within normal limits  BASIC METABOLIC PANEL - Abnormal; Notable for the following:    Potassium 3.4 (*)    Chloride 114 (*)    CO2 17 (*)    Creatinine, Ser 1.42 (*)    Calcium 8.8 (*)    GFR calc non Af Amer 44 (*)    GFR calc Af Amer 51 (*)    All other components within normal limits  URINALYSIS, ROUTINE W REFLEX MICROSCOPIC (NOT AT [facility]) - Abnormal; Notable for the  following:    APPearance HAZY (*)    Specific Gravity, Urine >1.030 (*)    Hgb urine dipstick MODERATE (*)    Protein, ur TRACE (*)    All other components within normal limits  URINE MICROSCOPIC-ADD ON - Abnormal; Notable for the following:    Squamous Epithelial / LPF 0-5 (*)    Bacteria, UA FEW (*)    Crystals CA OXALATE CRYSTALS (*)    All other components within normal limits  POC URINE PREG, ED    EKG  EKG Interpretation  None       Radiology Ct Renal Stone Study  Result Date: 03/13/2016 CLINICAL DATA:  Left flank pain and gross hematuria beginning last night. History of renal stone disease. EXAM: CT ABDOMEN AND PELVIS WITHOUT CONTRAST TECHNIQUE: Multidetector CT imaging of the abdomen and pelvis was performed following the standard protocol without IV contrast. COMPARISON:  10/18/2015 FINDINGS: Lower chest: Normal Hepatobiliary: Previous cholecystectomy. Normal appearance of the liver without contrast. Pancreas: Normal Spleen: Normal Adrenals/Urinary Tract: Normal adrenal glands. There is an nonobstructing 2 mm stone in the upper pole of the right kidney. There is a nonobstructing 2 mm stone in the lower pole the right kidney. The left kidney is atrophic. No stone. No hydroureteronephrosis. No stone along the course of either ureter. No stone in the bladder. Stomach/Bowel: Normal Vascular/Lymphatic: Normal Reproductive: Uterus and adnexal regions are normal. Other: No ascites or free air. Musculoskeletal: Curvature in degenerative change of the spine. IMPRESSION: No acute finding. Previously seen hydroureteronephrosis on the left has resolved. The left kidney is atrophic. Two small nonobstructing stones in the right kidney which have actually developed since the previous exam. Previous cholecystectomy. Electronically Signed   By: Nelson Chimes M.D.   On: 03/13/2016 18:26    Procedures Procedures (including critical care time)  Medications Ordered in ED Medications  sodium  chloride 0.9 % bolus 1,000 mL (0 mLs Intravenous Stopped 03/13/16 2046)  HYDROmorphone (DILAUDID) injection 1 mg (1 mg Intravenous Given 03/13/16 1828)  ondansetron (ZOFRAN) injection 4 mg (4 mg Intravenous Given 03/13/16 1828)  HYDROmorphone (DILAUDID) injection 1 mg (1 mg Intravenous Given 03/13/16 2108)  fluconazole (DIFLUCAN) tablet 150 mg (150 mg Oral Given 03/13/16 2159)  diphenhydrAMINE (BENADRYL) capsule 25 mg (25 mg Oral Given 03/13/16 2159)     Initial Impression / Assessment and Plan / ED Course  I have reviewed the triage vital signs and the nursing notes.  Pertinent labs & imaging results that were available during my care of the patient were reviewed by me and considered in my medical decision making (see chart for details).  Clinical Course    Discussed lab and Ct imaging results.  Suspect renal colic.  Discussed chronic renal insufficiency and left atrophic kidney which pt states she was not aware she had.  I have referred her to urology with [facility] since Dr. Michela Pitcher has retired - she understands need for close f/u and will schedule appt.  She may also need renal f/u as well - will defer to urology discretion. Pt understands to arrange appt.  Oxycodone, increased fluid intake, zofran.   Final Clinical Impressions(s) / ED Diagnoses   Final diagnoses:  Flank pain  Ureteral colic  Renal insufficiency, mild    New Prescriptions Discharge Medication List as of 03/13/2016 10:09 PM       Evalee Jefferson, PA-C 03/14/16 [place], MD 03/18/16 2327

## 2016-03-13 NOTE — ED Notes (Signed)
Patient was given a prepackage of Oxycodone-Acetaminophen quantity six and given instructions on use, patient understood instructions.

## 2016-03-13 NOTE — Discharge Instructions (Signed)
As discussed, take the medicines prescribed for pain and nausea.  Call [facility] for a followup appointment here in [place].  As discussed, you also have some renal insufficiency as reflected by your chronically elevated creatinine level.  You will need followup for this as well with your primary doctor.  You may take the oxycodone prescribed for pain relief.  This will make you drowsy - do not drive within 4 hours of taking this medication.

## 2016-03-13 NOTE — ED Triage Notes (Signed)
Pt reports onset of L flank pain last night with hematuria, and n/v. Pt has hx of kidney stones.

## 2016-03-18 MED FILL — Oxycodone w/ Acetaminophen Tab 5-325 MG: ORAL | Qty: 6 | Status: AC

## 2016-04-07 DIAGNOSIS — F4011 Social phobia, generalized: Secondary | ICD-10-CM | POA: Diagnosis not present

## 2016-04-07 DIAGNOSIS — F3132 Bipolar disorder, current episode depressed, moderate: Secondary | ICD-10-CM | POA: Diagnosis not present

## 2016-04-07 DIAGNOSIS — F5000 Anorexia nervosa, unspecified: Secondary | ICD-10-CM | POA: Diagnosis not present

## 2016-04-08 DIAGNOSIS — I129 Hypertensive chronic kidney disease with stage 1 through stage 4 chronic kidney disease, or unspecified chronic kidney disease: Secondary | ICD-10-CM | POA: Diagnosis not present

## 2016-04-08 DIAGNOSIS — E559 Vitamin D deficiency, unspecified: Secondary | ICD-10-CM | POA: Diagnosis not present

## 2016-04-08 DIAGNOSIS — Z0389 Encounter for observation for other suspected diseases and conditions ruled out: Secondary | ICD-10-CM | POA: Diagnosis not present

## 2016-04-08 DIAGNOSIS — Z13220 Encounter for screening for lipoid disorders: Secondary | ICD-10-CM | POA: Diagnosis not present

## 2016-04-08 DIAGNOSIS — Z131 Encounter for screening for diabetes mellitus: Secondary | ICD-10-CM | POA: Diagnosis not present

## 2016-04-08 DIAGNOSIS — E669 Obesity, unspecified: Secondary | ICD-10-CM | POA: Diagnosis not present

## 2016-04-08 DIAGNOSIS — I10 Essential (primary) hypertension: Secondary | ICD-10-CM | POA: Diagnosis not present

## 2016-04-08 DIAGNOSIS — Z23 Encounter for immunization: Secondary | ICD-10-CM | POA: Diagnosis not present

## 2016-04-08 DIAGNOSIS — Z1329 Encounter for screening for other suspected endocrine disorder: Secondary | ICD-10-CM | POA: Diagnosis not present

## 2016-04-08 DIAGNOSIS — R5383 Other fatigue: Secondary | ICD-10-CM | POA: Diagnosis not present

## 2016-04-09 DIAGNOSIS — R109 Unspecified abdominal pain: Secondary | ICD-10-CM | POA: Diagnosis not present

## 2016-04-09 DIAGNOSIS — Z87442 Personal history of urinary calculi: Secondary | ICD-10-CM | POA: Diagnosis not present

## 2016-04-09 DIAGNOSIS — N132 Hydronephrosis with renal and ureteral calculous obstruction: Secondary | ICD-10-CM | POA: Diagnosis not present

## 2016-04-09 DIAGNOSIS — F319 Bipolar disorder, unspecified: Secondary | ICD-10-CM | POA: Diagnosis not present

## 2016-04-09 DIAGNOSIS — N200 Calculus of kidney: Secondary | ICD-10-CM | POA: Diagnosis not present

## 2016-04-09 DIAGNOSIS — Z87440 Personal history of urinary (tract) infections: Secondary | ICD-10-CM | POA: Diagnosis not present

## 2016-04-09 DIAGNOSIS — Z79899 Other long term (current) drug therapy: Secondary | ICD-10-CM | POA: Diagnosis not present

## 2016-04-09 DIAGNOSIS — I10 Essential (primary) hypertension: Secondary | ICD-10-CM | POA: Diagnosis not present

## 2016-04-10 ENCOUNTER — Encounter (HOSPITAL_COMMUNITY): Payer: Self-pay

## 2016-04-10 ENCOUNTER — Emergency Department (HOSPITAL_COMMUNITY)
Admission: EM | Admit: 2016-04-10 | Discharge: 2016-04-10 | Disposition: A | Discharge status: Home / Self Care | Payer: Medicare Other | Attending: Emergency Medicine | Admitting: Emergency Medicine

## 2016-04-10 ENCOUNTER — Emergency Department (HOSPITAL_COMMUNITY): Payer: Medicare Other

## 2016-04-10 DIAGNOSIS — I10 Essential (primary) hypertension: Secondary | ICD-10-CM | POA: Insufficient documentation

## 2016-04-10 DIAGNOSIS — R109 Unspecified abdominal pain: Secondary | ICD-10-CM | POA: Insufficient documentation

## 2016-04-10 DIAGNOSIS — Z79899 Other long term (current) drug therapy: Secondary | ICD-10-CM | POA: Insufficient documentation

## 2016-04-10 DIAGNOSIS — Z765 Malingerer [conscious simulation]: Secondary | ICD-10-CM

## 2016-04-10 DIAGNOSIS — N1330 Unspecified hydronephrosis: Secondary | ICD-10-CM | POA: Diagnosis not present

## 2016-04-10 DIAGNOSIS — R112 Nausea with vomiting, unspecified: Secondary | ICD-10-CM | POA: Diagnosis not present

## 2016-04-10 DIAGNOSIS — Z7289 Other problems related to lifestyle: Secondary | ICD-10-CM | POA: Diagnosis not present

## 2016-04-10 DIAGNOSIS — Z7722 Contact with and (suspected) exposure to environmental tobacco smoke (acute) (chronic): Secondary | ICD-10-CM | POA: Insufficient documentation

## 2016-04-10 HISTORY — DX: Calculus of kidney: N20.0

## 2016-04-10 LAB — BASIC METABOLIC PANEL
Anion gap: 7 (ref 5–15)
BUN: 25 mg/dL — AB (ref 6–20)
CALCIUM: 9 mg/dL (ref 8.9–10.3)
CO2: 20 mmol/L — AB (ref 22–32)
Chloride: 111 mmol/L (ref 101–111)
Creatinine, Ser: 1.72 mg/dL — ABNORMAL HIGH (ref 0.44–1.00)
GFR calc Af Amer: 41 mL/min — ABNORMAL LOW (ref >60)
GFR, EST NON AFRICAN AMERICAN: 35 mL/min — AB (ref >60)
GLUCOSE: 100 mg/dL — AB (ref 65–99)
Potassium: 3.2 mmol/L — ABNORMAL LOW (ref 3.5–5.1)
Sodium: 138 mmol/L (ref 135–145)

## 2016-04-10 LAB — URINE MICROSCOPIC-ADD ON: WBC, UA: NONE SEEN WBC/hpf (ref 0–5)

## 2016-04-10 LAB — POC URINE PREG, ED: Preg Test, Ur: NEGATIVE

## 2016-04-10 LAB — CBC WITH DIFFERENTIAL/PLATELET
Basophils Absolute: 0.1 10*3/uL (ref 0.0–0.1)
Basophils Relative: 1 %
EOS PCT: 6 %
Eosinophils Absolute: 0.6 10*3/uL (ref 0.0–0.7)
HCT: 37 % (ref 36.0–46.0)
Hemoglobin: 12.3 g/dL (ref 12.0–15.0)
LYMPHS ABS: 4 10*3/uL (ref 0.7–4.0)
LYMPHS PCT: 40 %
MCH: 30 pg (ref 26.0–34.0)
MCHC: 33.2 g/dL (ref 30.0–36.0)
MCV: 90.2 fL (ref 78.0–100.0)
MONO ABS: 0.6 10*3/uL (ref 0.1–1.0)
MONOS PCT: 6 %
Neutro Abs: 4.7 10*3/uL (ref 1.7–7.7)
Neutrophils Relative %: 47 %
PLATELETS: 267 10*3/uL (ref 150–400)
RBC: 4.1 MIL/uL (ref 3.87–5.11)
RDW: 15 % (ref 11.5–15.5)
WBC: 9.9 10*3/uL (ref 4.0–10.5)

## 2016-04-10 LAB — URINALYSIS, ROUTINE W REFLEX MICROSCOPIC
Bilirubin Urine: NEGATIVE
GLUCOSE, UA: NEGATIVE mg/dL
KETONES UR: NEGATIVE mg/dL
Leukocytes, UA: NEGATIVE
Nitrite: NEGATIVE
PH: 5.5 (ref 5.0–8.0)
Specific Gravity, Urine: >1.03 — ABNORMAL HIGH (ref 1.005–1.030)

## 2016-04-10 MED ORDER — ONDANSETRON 4 MG PO TBDP: 4.0000 mg | ORAL_TABLET | Freq: Three times a day (TID) | ORAL | 0 refills | Status: DC | PRN

## 2016-04-10 MED ORDER — ONDANSETRON HCL 4 MG/2ML IJ SOLN
4.0000 mg | Freq: Once | INTRAMUSCULAR | Status: AC
  Administered 2016-04-10: 4 mg via INTRAVENOUS
  Filled 2016-04-10: qty 2

## 2016-04-10 MED ORDER — IBUPROFEN 800 MG PO TABS: 800.0000 mg | ORAL_TABLET | Freq: Three times a day (TID) | ORAL | 0 refills | Status: DC | PRN

## 2016-04-10 MED ORDER — PROMETHAZINE HCL 25 MG/ML IJ SOLN
25.0000 mg | Freq: Once | INTRAMUSCULAR | Status: AC
  Administered 2016-04-10: 25 mg via INTRAVENOUS
  Filled 2016-04-10: qty 1

## 2016-04-10 MED ORDER — HYDROMORPHONE HCL 1 MG/ML IJ SOLN
1.0000 mg | Freq: Once | INTRAMUSCULAR | Status: AC
  Administered 2016-04-10: 1 mg via INTRAVENOUS
  Filled 2016-04-10: qty 1

## 2016-04-10 MED ORDER — KETOROLAC TROMETHAMINE 30 MG/ML IJ SOLN
30.0000 mg | Freq: Once | INTRAMUSCULAR | Status: AC
  Administered 2016-04-10: 30 mg via INTRAVENOUS
  Filled 2016-04-10: qty 1

## 2016-04-10 MED ORDER — SODIUM CHLORIDE 0.9 % IV BOLUS (SEPSIS)
1000.0000 mL | Freq: Once | INTRAVENOUS | Status: AC
  Administered 2016-04-10: 1000 mL via INTRAVENOUS

## 2016-04-10 NOTE — ED Notes (Signed)
Pt is awaiting arrival of taxi to transport her home.

## 2016-04-10 NOTE — ED Notes (Signed)
Pt requesting more Dilaudid and dose of Phenergan. MD notified.

## 2016-04-10 NOTE — ED Notes (Signed)
Pt requesting "Benadryl" for itching. MD notified.

## 2016-04-10 NOTE — ED Triage Notes (Signed)
Pt reports pain to her right flank, states history of kidney stones and feels her pain is related to same.  Pt also has urinary urgency.

## 2016-04-10 NOTE — ED Provider Notes (Signed)
TIME SEEN: 5:30 AM  CHIEF COMPLAINT: Right flank pain  HPI: Pt is a 45 y.o. female with history of hypertension, kidney stones who presents to the emergency department with complaints of right flank pain. Pain started at 10 AM and is described as sharp and severe. She reports associated nausea, vomiting and difficulty urinating. Denies dysuria, hematuria. No diarrhea, fevers or chills. No vaginal bleeding or discharge. She reports that she has had lithotripsy, basket retrieval and ureteral stents in the past. She states that she is concerned that she may need another stent because of the feeling that she cannot urinate. Does not currently have a urologist. Is trying to transition care to [facility] but has not yet seen them.  ROS: See HPI Constitutional: no fever  Eyes: no drainage  ENT: no runny nose   Cardiovascular:  no chest pain  Resp: no SOB  GI:  vomiting GU: no dysuria Integumentary: no rash  Allergy: no hives  Musculoskeletal: no leg swelling  Neurological: no slurred speech ROS otherwise negative  PAST MEDICAL HISTORY/PAST SURGICAL HISTORY:  Past Medical History:  Diagnosis Date  . Hypertension   . Kidney stones   . Renal disorder     MEDICATIONS:  Prior to Admission medications   Medication Sig Start Date End Date Taking? Authorizing Provider  albuterol (PROVENTIL HFA;VENTOLIN HFA) 108 (90 BASE) MCG/ACT inhaler Inhale 1-2 puffs into the lungs every 6 (six) hours as needed for wheezing or shortness of breath.    Historical Provider, MD  ALPRAZolam Duanne Moron) 1 MG tablet Take 1-2 mg by mouth 2 (two) times daily. 1mg  in the morning and 2mg  at bedtime    Historical Provider, MD  amLODipine (NORVASC) 5 MG tablet Take 10 mg by mouth daily.    Historical Provider, MD  cloNIDine (CATAPRES) 0.1 MG tablet Take 0.2 mg by mouth 2 (two) times daily.    Historical Provider, MD  desvenlafaxine (PRISTIQ) 50 MG 24 hr tablet Take 50 mg by mouth daily.    Historical Provider, MD   doxepin (SINEQUAN) 75 MG capsule Take 150 mg by mouth at bedtime.     Historical Provider, MD  lamoTRIgine (LAMICTAL) 100 MG tablet Take 100-200 mg by mouth 2 (two) times daily. `100mg  in the morning and 200mg  at bedtime    Historical Provider, MD  medroxyPROGESTERone (DEPO-PROVERA) 150 MG/ML injection Inject 150 mg into the muscle every 3 (three) months.    Historical Provider, MD  Multiple Vitamin (MULTIVITAMIN WITH MINERALS) TABS tablet Take 1 tablet by mouth daily.    Historical Provider, MD  ondansetron (ZOFRAN ODT) 8 MG disintegrating tablet Take 1 tablet (8 mg total) by mouth every 8 (eight) hours as needed for nausea or vomiting. 03/13/16   Evalee Jefferson, PA-C  oxyCODONE-acetaminophen (PERCOCET/ROXICET) 5-325 MG tablet Take 1 tablet by mouth every 4 (four) hours as needed. 03/13/16   Evalee Jefferson, PA-C  oxyCODONE-acetaminophen (PERCOCET/ROXICET) 5-325 MG tablet Take 1 tablet by mouth every 4 (four) hours as needed for severe pain. 03/13/16   Evalee Jefferson, PA-C  potassium chloride (K-DUR) 10 MEQ tablet Take 10 mEq by mouth 2 (two) times daily.    Historical Provider, MD  potassium chloride 20 MEQ TBCR Take 20 mEq by mouth 2 (two) times daily. 10/19/15   Sahid Borba N Iori Gigante, DO  QUEtiapine (SEROQUEL XR) 300 MG 24 hr tablet Take 300 mg by mouth at bedtime.    Historical Provider, MD  topiramate (TOPAMAX) 50 MG tablet Take 100 mg by mouth 2 (two) times  daily.     Historical Provider, MD    ALLERGIES:  Allergies  Allergen Reactions  . Morphine And Related Itching  . Sulfa Antibiotics Swelling  . Tape     Surgical     SOCIAL HISTORY:  Social History  Substance Use Topics  . Smoking status: Passive Smoke Exposure - Never Smoker  . Smokeless tobacco: Never Used  . Alcohol use No    FAMILY HISTORY: Family History  Problem Relation Age of Onset  . Cancer Mother   . Heart failure Mother   . Cancer Father     EXAM: BP 144/79 (BP Location: Left Arm)   Pulse (!) 132   Temp 98 F (36.7 C)  (Oral)   Resp 24   Ht 5\' 1"  (1.549 m)   Wt 187 lb (84.8 kg)   SpO2 98%   BMI 35.33 kg/m  CONSTITUTIONAL: Alert and oriented and responds appropriately to questions. Chronically ill-appearing, afebrile, appears uncomfortable and is pacing around the room HEAD: Normocephalic EYES: Conjunctivae clear, PERRL, EOMI ENT: normal nose; no rhinorrhea; moist mucous membranes NECK: Supple, no meningismus, no nuchal rigidity, no LAD  CARD: Regular and minimally tachycardic; S1 and S2 appreciated; no murmurs, no clicks, no rubs, no gallops RESP: Normal chest excursion without splinting or tachypnea; breath sounds clear and equal bilaterally; no wheezes, no rhonchi, no rales, no hypoxia or respiratory distress, speaking full sentences ABD/GI: Normal bowel sounds; non-distended; soft, non-tender, no rebound, no guarding, no peritoneal signs, no hepatosplenomegaly BACK:  The back appears normal and is non-tender to palpation, there is no CVA tenderness, no midline spinal tenderness or step-off or deformity EXT: Normal ROM in all joints; non-tender to palpation; no edema; normal capillary refill; no cyanosis, no calf tenderness or swelling   SKIN: Normal color for age and race; warm; no rash NEURO: Moves all extremities equally, sensation to light touch intact diffusely, cranial nerves II through XII intact, normal speech, normal gait PSYCH: The patient's mood and manner are appropriate. Grooming and personal hygiene are appropriate.  MEDICAL DECISION MAKING: Patient here with right flank pain. States this feels similar to prior kidney stone. Previous CT scans have shown that she has had ureterolithiasis in the past. Last CT scan was in October that showed no ureterolithiasis but she did have punctate stones in the right kidney. She has chronic atrophy of the left kidney. Will obtain labs, urine. She states that her creatinine has been elevated in the past. She states she is concerned that she may need another  ureteral stent because when she has had similar symptoms she required intervention. Will repeat CT scan today due to this concern. Will treat with IV fluids, Toradol, Dilaudid, Zofran.  ED PROGRESS: 8:15 AM  Pt's labs show mildly elevated creatinine which is her baseline. Urine shows blood but no other sign of infection. CT scan shows that she has no ureterolithiasis or hydronephrosis. The radiologist was able to see that she had a CT scan yesterday which she did not inform me of. I did discuss with her when I initially saw her that her last CT appeared to be in October and discussed risk and benefits of radiation exposure. She did not tell me that she was at [facility] yesterday and that she received a CT scan there yesterday. I have discussed with her at length that repeated CT scans will put her at risk for cancer given the increased radiation exposure. I feel that she is not providing this information as there  is drug seeking behavior. She specifically asked for Dilaudid, Phenergan and Benadryl by name. I do not feel she needs any further narcotics at this time. We'll discharge her home with outpatient follow-up. We'll discharge with prescription for ibuprofen, Zofran. Discussed with her return precautions.   At this time, I do not feel there is any life-threatening condition present. I have reviewed and discussed all results (EKG, imaging, lab, urine as appropriate) and exam findings with patient/family. I have reviewed nursing notes and appropriate previous records.  I feel the patient is safe to be discharged home without further emergent workup and can continue workup as an outpatient as needed. Discussed usual and customary return precautions. Patient/family verbalize understanding and are comfortable with this plan.  Outpatient follow-up has been provided. All questions have been answered.      [place], DO 04/10/16 [phone]

## 2016-04-14 DIAGNOSIS — F3131 Bipolar disorder, current episode depressed, mild: Secondary | ICD-10-CM | POA: Diagnosis not present

## 2016-04-14 DIAGNOSIS — F4011 Social phobia, generalized: Secondary | ICD-10-CM | POA: Diagnosis not present

## 2016-04-15 DIAGNOSIS — Z6835 Body mass index (BMI) 35.0-35.9, adult: Secondary | ICD-10-CM | POA: Diagnosis not present

## 2016-04-15 DIAGNOSIS — E669 Obesity, unspecified: Secondary | ICD-10-CM | POA: Diagnosis not present

## 2016-04-15 DIAGNOSIS — E559 Vitamin D deficiency, unspecified: Secondary | ICD-10-CM | POA: Diagnosis not present

## 2016-04-15 DIAGNOSIS — I129 Hypertensive chronic kidney disease with stage 1 through stage 4 chronic kidney disease, or unspecified chronic kidney disease: Secondary | ICD-10-CM | POA: Diagnosis not present

## 2016-04-15 DIAGNOSIS — R5383 Other fatigue: Secondary | ICD-10-CM | POA: Diagnosis not present

## 2016-04-23 DIAGNOSIS — Z6836 Body mass index (BMI) 36.0-36.9, adult: Secondary | ICD-10-CM | POA: Diagnosis not present

## 2016-04-30 ENCOUNTER — Ambulatory Visit: Payer: Medicare Other | Admitting: Urology

## 2016-05-06 ENCOUNTER — Encounter (INDEPENDENT_AMBULATORY_CARE_PROVIDER_SITE_OTHER): Payer: Self-pay | Admitting: *Deleted

## 2016-05-06 ENCOUNTER — Ambulatory Visit (INDEPENDENT_AMBULATORY_CARE_PROVIDER_SITE_OTHER): Payer: Medicare Other | Admitting: Internal Medicine

## 2016-05-06 ENCOUNTER — Encounter (INDEPENDENT_AMBULATORY_CARE_PROVIDER_SITE_OTHER): Payer: Self-pay | Admitting: Internal Medicine

## 2016-05-06 VITALS — BP 110/80 | HR 86 | Temp 98.6°F | Resp 18 | Ht 61.0 in | Wt 191.9 lb

## 2016-05-06 DIAGNOSIS — K529 Noninfective gastroenteritis and colitis, unspecified: Secondary | ICD-10-CM | POA: Diagnosis not present

## 2016-05-06 DIAGNOSIS — R1310 Dysphagia, unspecified: Secondary | ICD-10-CM | POA: Diagnosis not present

## 2016-05-06 DIAGNOSIS — R1319 Other dysphagia: Secondary | ICD-10-CM

## 2016-05-06 MED ORDER — CHOLESTYRAMINE 4 G PO PACK: 4.0000 g | PACK | Freq: Two times a day (BID) | ORAL | 2 refills | Status: DC

## 2016-05-06 NOTE — Patient Instructions (Addendum)
Hemoccult 1  Keep stool diary as to frequency and consistency of stools for next 2 weeks. Keep symptom diary as to frequency of heartburn episodes 2 weeks. Esophagogram to be scheduled.

## 2016-05-06 NOTE — Progress Notes (Signed)
Reason for consultation;  Chronic diarrhea.  History of present illness:  [name] is 45 year old Caucasian female who is referred for evaluation of chronic diarrhea through courtesy of Dr. Consuello Masse. Patient states her primary care physician now is Dr.Gosrani. Patient states she has had diarrhea all of her life. He states she has never been constipated. She has anywhere from 1-5 stools per day. She has occasional nocturnal bowel movement. Her stool consistency is mostly watery. She may have 1-3 formed stools per month. She states her appetite is not good. She is on comfort foods. She has noted small amount of blood with her bowel movements intermittently. She has never experienced frank bleeding or melena. She also has had moments when her stool was greenish. She has midabdominal cramping with urgency. She also complains of dysphagia with solids and at times liquids. It started 2 months ago. She has heartburn maybe twice a week and she has intermittent regurgitation. She says she was treated for bronchitis antibiotics in August and again in September 2017. She has not lost any weight recently. She states back in 2010 she lost weight from 181 pounds 125 pounds while she is on Topamax slowly she has regained weight. Prior workup includes EGD and flexible sigmoidoscopy by Dr. Barney Drain in March 2011. EGD revealed some food debris in the stomach and mild gastritis but biopsies were negative for H. pylori. Duodenal biopsies were negative for celiac disease. Sigmoidoscopy revealed mild focal proctitis. Lab see from sigmoid colon revealed melanosis. Patient recalls that she had EGD and colonoscopy by Dr. Doristine Mango of [facility] about 5 years ago and tests were normal. She states she was given 1 medication but it did not help. Review of the systems is positive for dry mouth. She feels her depression is well controlled. She is receiving care at [facility] in  [place]. She also gives history of right breast fibroadenoma and is being observed.   Current Medications: Outpatient Encounter Prescriptions as of 05/06/2016  Medication Sig  . ALPRAZolam (XANAX) 1 MG tablet Take 1-2 mg by mouth 2 (two) times daily. 1mg  in the morning and 2mg  at bedtime  . amLODipine (NORVASC) 5 MG tablet Take 10 mg by mouth daily.  . Cholecalciferol (VITAMIN D PO) Take by mouth daily.  . cloNIDine (CATAPRES) 0.1 MG tablet Take 0.2 mg by mouth 2 (two) times daily.  Marland Kitchen doxepin (SINEQUAN) 75 MG capsule Take 75 mg by mouth at bedtime. Patient states that she takes 3 by mouth at night.  . lamoTRIgine (LAMICTAL) 100 MG tablet Take 100-200 mg by mouth 2 (two) times daily. `100mg  in the morning and 200mg  at bedtime  . medroxyPROGESTERone (DEPO-PROVERA) 150 MG/ML injection Inject 150 mg into the muscle every 3 (three) months.  . Multiple Vitamin (MULTIVITAMIN WITH MINERALS) TABS tablet Take 1 tablet by mouth daily.  . potassium chloride (K-DUR) 10 MEQ tablet Take 10 mEq by mouth 2 (two) times daily.  . prazosin (MINIPRESS) 2 MG capsule Take 2 mg by mouth 2 (two) times daily.   . QUEtiapine (SEROQUEL XR) 300 MG 24 hr tablet Take 300 mg by mouth at bedtime.  . [DISCONTINUED] albuterol (PROVENTIL HFA;VENTOLIN HFA) 108 (90 BASE) MCG/ACT inhaler Inhale 1-2 puffs into the lungs every 6 (six) hours as needed for wheezing or shortness of breath.  . [DISCONTINUED] desvenlafaxine (PRISTIQ) 50 MG 24 hr tablet Take 50 mg by mouth daily.  . [DISCONTINUED] ibuprofen (ADVIL,MOTRIN) 800 MG tablet Take 1 tablet (800 mg total)  by mouth every 8 (eight) hours as needed for mild pain. (Patient not taking: Reported on 05/06/2016)  . [DISCONTINUED] ondansetron (ZOFRAN ODT) 4 MG disintegrating tablet Take 1 tablet (4 mg total) by mouth every 8 (eight) hours as needed for nausea or vomiting. (Patient not taking: Reported on 05/06/2016)  . [DISCONTINUED] ondansetron (ZOFRAN ODT) 8 MG disintegrating tablet Take  1 tablet (8 mg total) by mouth every 8 (eight) hours as needed for nausea or vomiting. (Patient not taking: Reported on 05/06/2016)  . [DISCONTINUED] potassium chloride 20 MEQ TBCR Take 20 mEq by mouth 2 (two) times daily. (Patient not taking: Reported on 05/06/2016)  . [DISCONTINUED] topiramate (TOPAMAX) 50 MG tablet Take 100 mg by mouth 2 (two) times daily.    No facility-administered encounter medications on file as of 05/06/2016.    Past medical history:  History of kidney stones. She has undergone multiple cystoscopies and lithotripsies. Depression since 2000. Chronic diarrhea. She was diagnosed with IBS over 5 years ago. Hypertension diagnosed 2 years ago. Cholecystectomy about 5 years ago. Right breast fibroadenoma. Elevated serum creatinine. Obesity. BMI is 36.1.  Allergies:  Allergies  Allergen Reactions  . Morphine And Related Itching  . Sulfa Antibiotics Swelling  . Tape     Surgical     Family history:  Father had congenital heart disease and eyes at age 39. Mother is 79 and has thyroid disease. She does not have any siblings.   Social history:  She is divorced and does not have any children. She worked at [employer] for 12 years but now on disability. She does not smoke cigarettes and drinks alcohol occasionally. She does not do regular exercise but walks at least 3 times a week and usually 1 mile each time.   Physical examination: Blood pressure 110/80, pulse 86, temperature 98.6 F (37 C), temperature source Oral, resp. rate 18, height 5\' 1"  (1.549 m), weight 191 lb 14.4 oz (87 kg). Patient is alert and in no acute distress. Conjunctiva is pink. Sclera is nonicteric Oropharyngeal mucosa is dry otherwise normal. No neck masses or thyromegaly noted. Cardiac exam with regular rhythm normal S1 and S2. No murmur or gallop noted. Lungs are clear to auscultation. Abdomen is full. Bowel sounds are normal. On palpation abdomen is soft and nontender without  organomegaly or masses. No LE edema or clubbing noted.  Labs/studies Results: Lab data from 04/10/2016  WBC 9.9, H&H 12.3 and 37.0 and platelet count 267K. Eosinophil count 6%.    Assessment:  #1. Chronic diarrhea. It appears she has been evaluated twice in the past and workup negative for inflammatory bowel disease. It is unclear as to what sort of therapies have been tried in the past. Records from [facility] need to be reviewed. She already has dry mouth due to location for depression and may not do well with anti-spasmodic/anti-cholinergics. #2. Esophageal dysphagia primarily to solids but sometimes also experience with liquids. EGD in March 2011 was unremarkable. She may have esophageal motility disorder or Schatzki's ring. .#3. Hematochezia most likely secondary to hemorrhoids.   Recommendations:  Will request EGD and colonoscopy records from [facility] in [facility]. Hemoccult 1. Barium pill esophagogram. Cholestyramine 4 g by mouth twice a day. Patient instructed to take this medication 2 hours before or after taking other medications. Patient advised to keep stool diary as to frequency and consistency of stools for the next [redacted] weeks along with heartburn frequency. Office visit in 2 months.

## 2016-05-09 ENCOUNTER — Ambulatory Visit (HOSPITAL_COMMUNITY): Payer: Medicare Other

## 2016-05-12 ENCOUNTER — Telehealth (INDEPENDENT_AMBULATORY_CARE_PROVIDER_SITE_OTHER): Payer: Self-pay | Admitting: *Deleted

## 2016-05-12 NOTE — Telephone Encounter (Signed)
   Diagnosis:    Result(s)   Card 1: Negative:          Completed by:    HEMOCCULT SENSA DEVELOPER: [id number]   EXPIRATION DATE: 2020-05   HEMOCCULT SENSA CARD:  [id number]   EXPIRATION DATE: 03/20   CARD CONTROL RESULTS:  POSITIVE: Positive  NEGATIVE: Negative    ADDITIONAL COMMENTS: Patient was called with the results. Forwarded to [place] for review.

## 2016-05-12 NOTE — Telephone Encounter (Signed)
Stool is guaiac negative. 

## 2016-05-14 ENCOUNTER — Ambulatory Visit (HOSPITAL_COMMUNITY)
Admission: RE | Admit: 2016-05-14 | Discharge: 2016-05-14 | Disposition: A | Discharge status: Home / Self Care | Payer: Medicare Other | Source: Ambulatory Visit | Attending: Internal Medicine | Admitting: Internal Medicine

## 2016-05-14 ENCOUNTER — Ambulatory Visit (HOSPITAL_COMMUNITY): Admission: RE | Admit: 2016-05-14 | Payer: Medicare Other | Source: Ambulatory Visit

## 2016-05-14 DIAGNOSIS — R1319 Other dysphagia: Secondary | ICD-10-CM

## 2016-05-14 DIAGNOSIS — R1310 Dysphagia, unspecified: Secondary | ICD-10-CM | POA: Diagnosis not present

## 2016-05-20 ENCOUNTER — Ambulatory Visit (INDEPENDENT_AMBULATORY_CARE_PROVIDER_SITE_OTHER): Payer: Medicare Other | Admitting: Urology

## 2016-05-20 DIAGNOSIS — N132 Hydronephrosis with renal and ureteral calculous obstruction: Secondary | ICD-10-CM

## 2016-05-20 DIAGNOSIS — N200 Calculus of kidney: Secondary | ICD-10-CM

## 2016-05-21 ENCOUNTER — Other Ambulatory Visit: Payer: Self-pay | Admitting: Urology

## 2016-05-21 DIAGNOSIS — N132 Hydronephrosis with renal and ureteral calculous obstruction: Secondary | ICD-10-CM

## 2016-06-10 DIAGNOSIS — F5081 Binge eating disorder: Secondary | ICD-10-CM | POA: Diagnosis not present

## 2016-06-10 DIAGNOSIS — F3131 Bipolar disorder, current episode depressed, mild: Secondary | ICD-10-CM | POA: Diagnosis not present

## 2016-06-11 DIAGNOSIS — F319 Bipolar disorder, unspecified: Secondary | ICD-10-CM | POA: Diagnosis not present

## 2016-06-11 DIAGNOSIS — I10 Essential (primary) hypertension: Secondary | ICD-10-CM | POA: Diagnosis not present

## 2016-06-11 DIAGNOSIS — Z79899 Other long term (current) drug therapy: Secondary | ICD-10-CM | POA: Diagnosis not present

## 2016-06-11 DIAGNOSIS — G43009 Migraine without aura, not intractable, without status migrainosus: Secondary | ICD-10-CM | POA: Diagnosis not present

## 2016-06-13 DIAGNOSIS — R928 Other abnormal and inconclusive findings on diagnostic imaging of breast: Secondary | ICD-10-CM | POA: Diagnosis not present

## 2016-06-13 DIAGNOSIS — Z1231 Encounter for screening mammogram for malignant neoplasm of breast: Secondary | ICD-10-CM | POA: Diagnosis not present

## 2016-06-23 DIAGNOSIS — Z01419 Encounter for gynecological examination (general) (routine) without abnormal findings: Secondary | ICD-10-CM | POA: Diagnosis not present

## 2016-06-23 DIAGNOSIS — Z6836 Body mass index (BMI) 36.0-36.9, adult: Secondary | ICD-10-CM | POA: Diagnosis not present

## 2016-06-25 DIAGNOSIS — R922 Inconclusive mammogram: Secondary | ICD-10-CM | POA: Diagnosis not present

## 2016-06-25 DIAGNOSIS — N6311 Unspecified lump in the right breast, upper outer quadrant: Secondary | ICD-10-CM | POA: Diagnosis not present

## 2016-06-25 DIAGNOSIS — N6011 Diffuse cystic mastopathy of right breast: Secondary | ICD-10-CM | POA: Diagnosis not present

## 2016-07-01 ENCOUNTER — Ambulatory Visit (INDEPENDENT_AMBULATORY_CARE_PROVIDER_SITE_OTHER): Payer: Medicare Other | Admitting: Internal Medicine

## 2016-07-01 ENCOUNTER — Ambulatory Visit: Payer: Medicare Other | Admitting: Urology

## 2016-07-02 DIAGNOSIS — N6011 Diffuse cystic mastopathy of right breast: Secondary | ICD-10-CM | POA: Diagnosis not present

## 2016-07-02 DIAGNOSIS — N6311 Unspecified lump in the right breast, upper outer quadrant: Secondary | ICD-10-CM | POA: Diagnosis not present

## 2016-07-02 DIAGNOSIS — N6091 Unspecified benign mammary dysplasia of right breast: Secondary | ICD-10-CM | POA: Diagnosis not present

## 2016-07-17 DIAGNOSIS — N132 Hydronephrosis with renal and ureteral calculous obstruction: Secondary | ICD-10-CM | POA: Diagnosis not present

## 2016-07-27 IMAGING — DX DG CHEST 2V
2 series · 2 of 2 positions shown · non-contrast
Comparison: None.

CLINICAL DATA: Productive cough with hemoptysis and chest pain for
6 days

EXAM:
CHEST  2 VIEW

[chest pa]
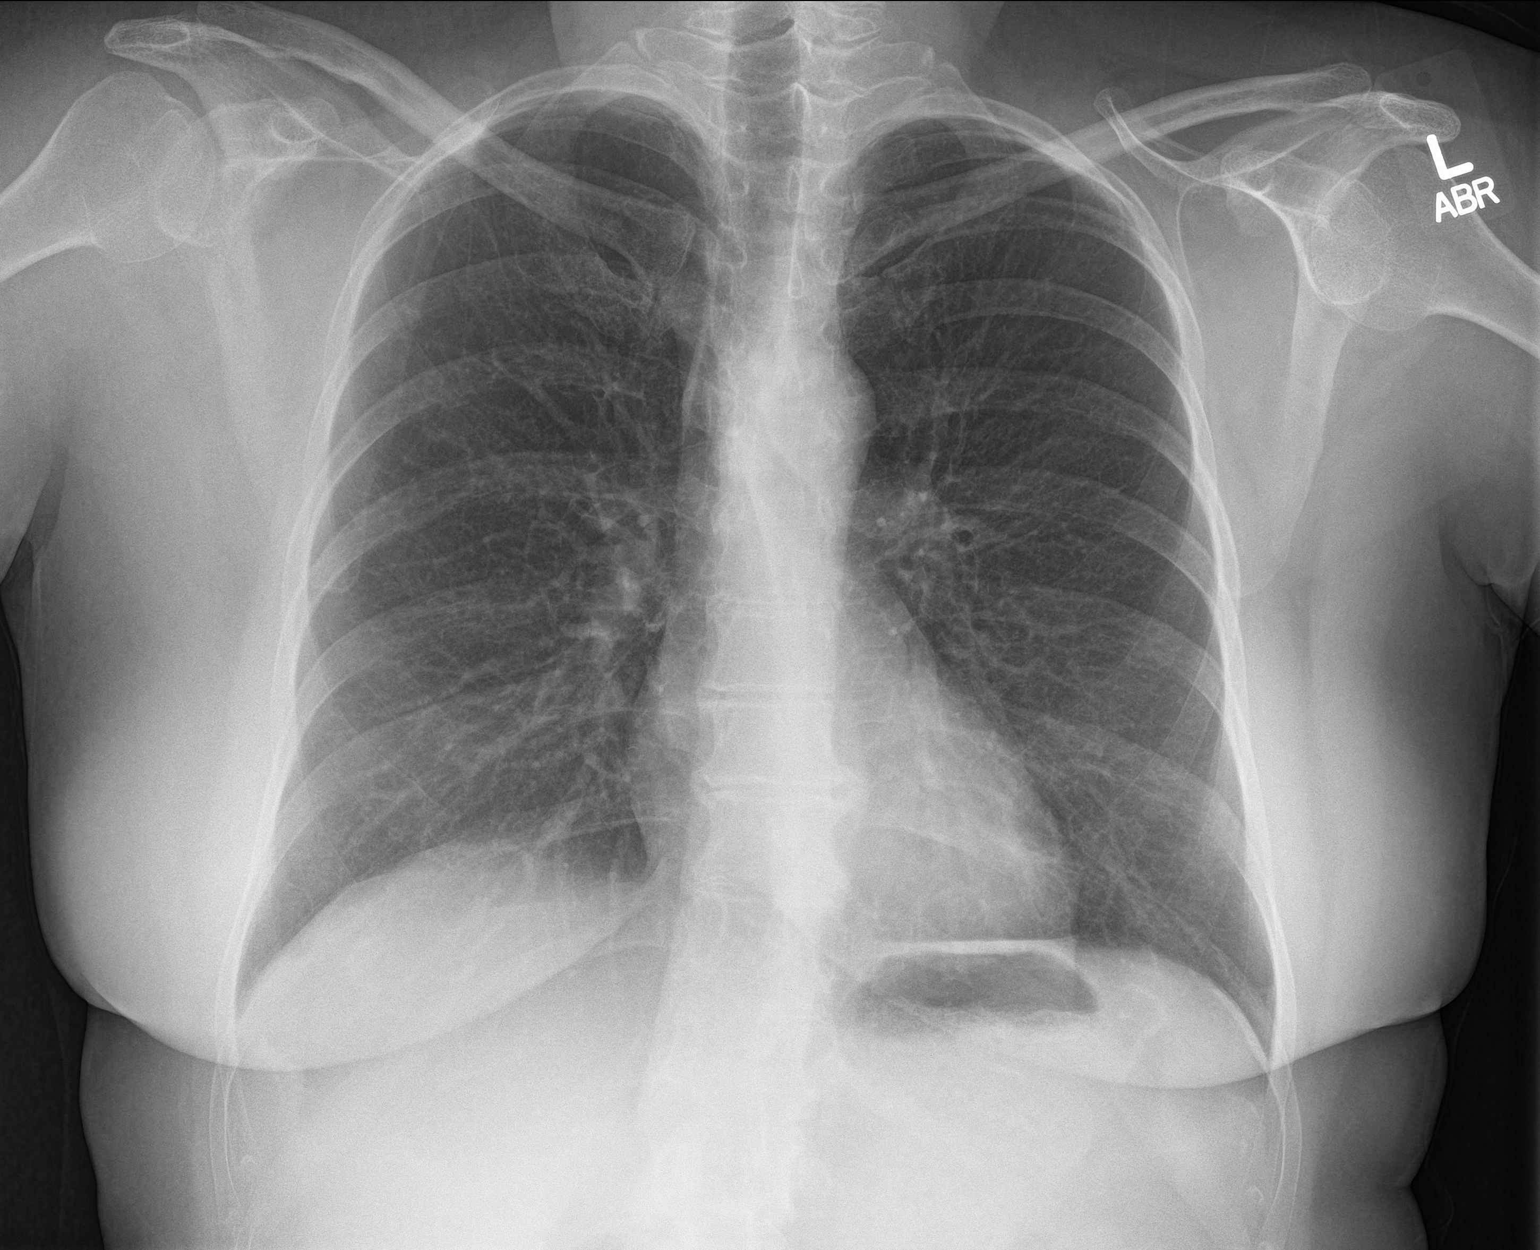

[chest lat]
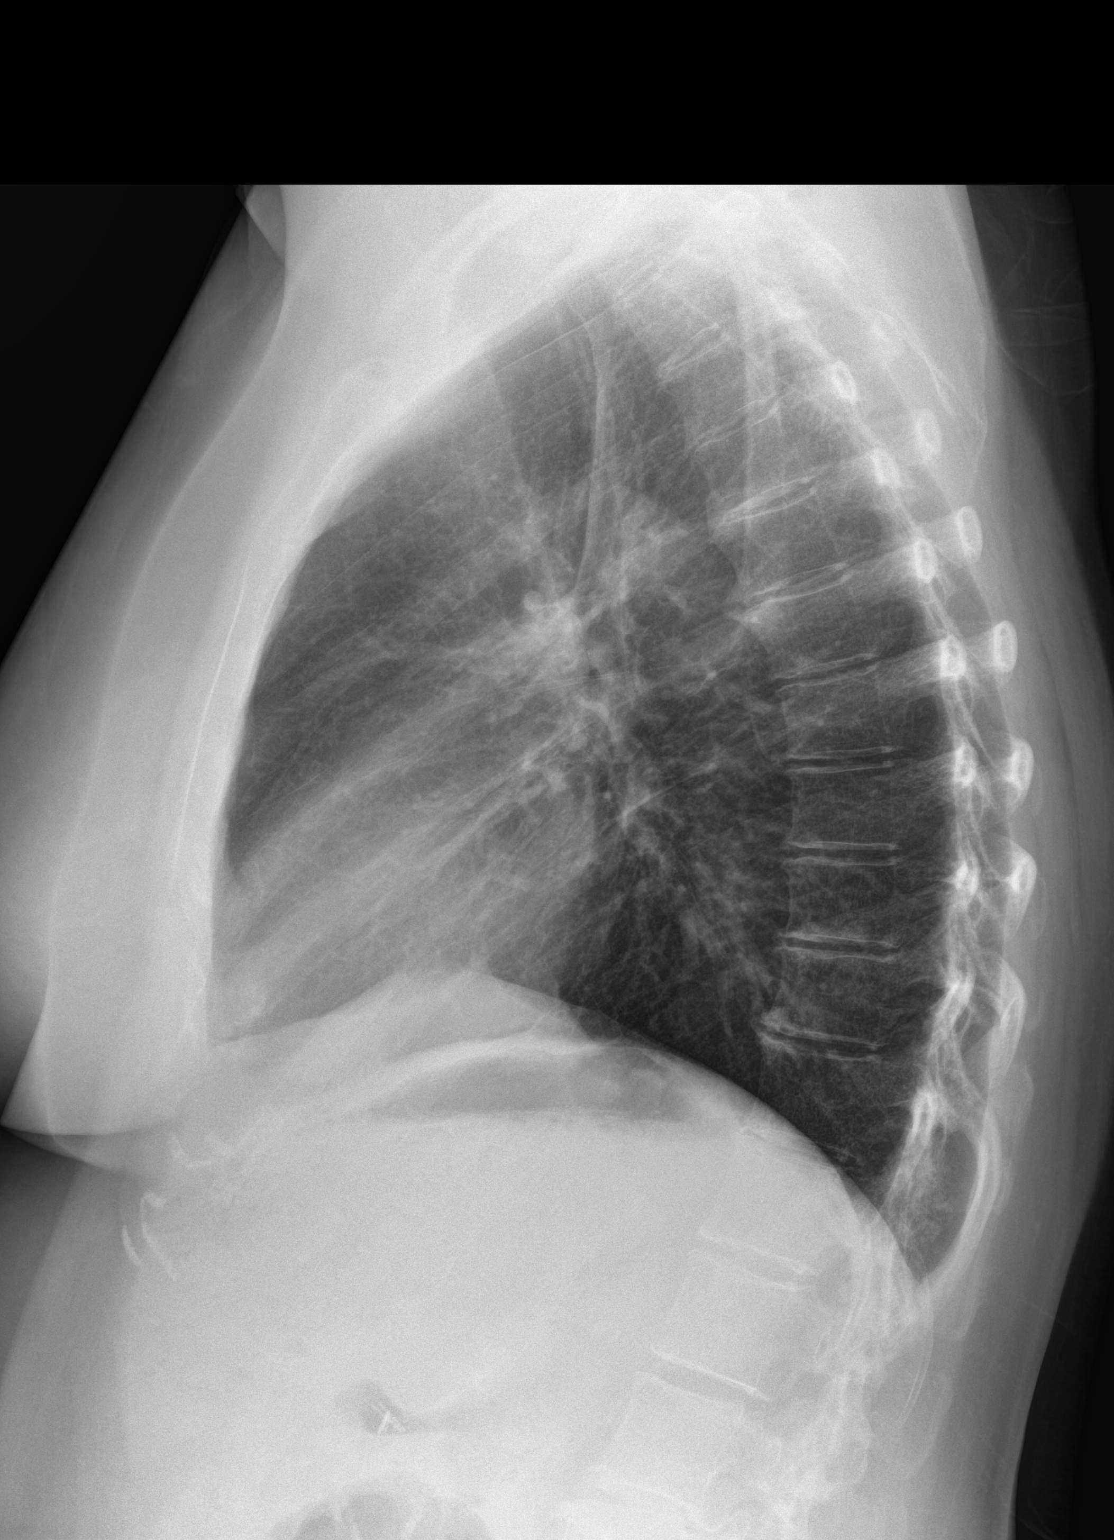

[2 of 2 positions shown; findings below may reference images not displayed]

FINDINGS: The heart size and mediastinal contours are within normal limits.
Both lungs are clear. The visualized skeletal structures are
unremarkable except for mild thoracic degenerative change diffusely.
Prior cholecystectomy noted.
IMPRESSION: No active cardiopulmonary disease.

## 2016-08-08 ENCOUNTER — Ambulatory Visit (HOSPITAL_COMMUNITY)
Admission: RE | Admit: 2016-08-08 | Discharge: 2016-08-08 | Disposition: A | Discharge status: Home / Self Care | Payer: Medicare Other | Source: Ambulatory Visit | Attending: Urology | Admitting: Urology

## 2016-08-08 DIAGNOSIS — N132 Hydronephrosis with renal and ureteral calculous obstruction: Secondary | ICD-10-CM | POA: Diagnosis not present

## 2016-08-19 ENCOUNTER — Ambulatory Visit (INDEPENDENT_AMBULATORY_CARE_PROVIDER_SITE_OTHER): Payer: Medicare Other | Admitting: Urology

## 2016-08-19 DIAGNOSIS — N132 Hydronephrosis with renal and ureteral calculous obstruction: Secondary | ICD-10-CM | POA: Diagnosis not present

## 2016-09-01 DIAGNOSIS — F5081 Binge eating disorder: Secondary | ICD-10-CM | POA: Diagnosis not present

## 2016-09-01 DIAGNOSIS — F4011 Social phobia, generalized: Secondary | ICD-10-CM | POA: Diagnosis not present

## 2016-09-01 DIAGNOSIS — F3131 Bipolar disorder, current episode depressed, mild: Secondary | ICD-10-CM | POA: Diagnosis not present

## 2016-09-01 DIAGNOSIS — F3132 Bipolar disorder, current episode depressed, moderate: Secondary | ICD-10-CM | POA: Diagnosis not present

## 2016-09-18 DIAGNOSIS — I129 Hypertensive chronic kidney disease with stage 1 through stage 4 chronic kidney disease, or unspecified chronic kidney disease: Secondary | ICD-10-CM | POA: Diagnosis not present

## 2016-09-18 DIAGNOSIS — I10 Essential (primary) hypertension: Secondary | ICD-10-CM | POA: Diagnosis not present

## 2016-09-18 DIAGNOSIS — E669 Obesity, unspecified: Secondary | ICD-10-CM | POA: Diagnosis not present

## 2016-11-12 DIAGNOSIS — I129 Hypertensive chronic kidney disease with stage 1 through stage 4 chronic kidney disease, or unspecified chronic kidney disease: Secondary | ICD-10-CM | POA: Diagnosis not present

## 2016-11-12 DIAGNOSIS — E559 Vitamin D deficiency, unspecified: Secondary | ICD-10-CM | POA: Diagnosis not present

## 2016-11-12 DIAGNOSIS — R5383 Other fatigue: Secondary | ICD-10-CM | POA: Diagnosis not present

## 2016-11-12 DIAGNOSIS — I10 Essential (primary) hypertension: Secondary | ICD-10-CM | POA: Diagnosis not present

## 2016-12-18 DIAGNOSIS — I129 Hypertensive chronic kidney disease with stage 1 through stage 4 chronic kidney disease, or unspecified chronic kidney disease: Secondary | ICD-10-CM | POA: Diagnosis not present

## 2016-12-18 DIAGNOSIS — I10 Essential (primary) hypertension: Secondary | ICD-10-CM | POA: Diagnosis not present

## 2016-12-30 DIAGNOSIS — F3131 Bipolar disorder, current episode depressed, mild: Secondary | ICD-10-CM | POA: Diagnosis not present

## 2016-12-30 DIAGNOSIS — F4011 Social phobia, generalized: Secondary | ICD-10-CM | POA: Diagnosis not present

## 2017-01-14 DIAGNOSIS — R51 Headache: Secondary | ICD-10-CM | POA: Diagnosis not present

## 2017-01-14 DIAGNOSIS — F319 Bipolar disorder, unspecified: Secondary | ICD-10-CM | POA: Diagnosis not present

## 2017-01-14 DIAGNOSIS — I10 Essential (primary) hypertension: Secondary | ICD-10-CM | POA: Diagnosis not present

## 2017-01-14 DIAGNOSIS — F419 Anxiety disorder, unspecified: Secondary | ICD-10-CM | POA: Diagnosis not present

## 2017-01-14 DIAGNOSIS — Z79899 Other long term (current) drug therapy: Secondary | ICD-10-CM | POA: Diagnosis not present

## 2017-01-14 DIAGNOSIS — G43909 Migraine, unspecified, not intractable, without status migrainosus: Secondary | ICD-10-CM | POA: Diagnosis not present

## 2017-01-20 DIAGNOSIS — I10 Essential (primary) hypertension: Secondary | ICD-10-CM | POA: Diagnosis not present

## 2017-01-20 DIAGNOSIS — K529 Noninfective gastroenteritis and colitis, unspecified: Secondary | ICD-10-CM | POA: Diagnosis not present

## 2017-01-27 DIAGNOSIS — I10 Essential (primary) hypertension: Secondary | ICD-10-CM | POA: Diagnosis not present

## 2017-01-27 DIAGNOSIS — F319 Bipolar disorder, unspecified: Secondary | ICD-10-CM | POA: Diagnosis not present

## 2017-01-27 DIAGNOSIS — Z809 Family history of malignant neoplasm, unspecified: Secondary | ICD-10-CM | POA: Diagnosis not present

## 2017-01-27 DIAGNOSIS — R42 Dizziness and giddiness: Secondary | ICD-10-CM | POA: Diagnosis not present

## 2017-01-27 DIAGNOSIS — R55 Syncope and collapse: Secondary | ICD-10-CM | POA: Diagnosis not present

## 2017-01-27 DIAGNOSIS — Z8249 Family history of ischemic heart disease and other diseases of the circulatory system: Secondary | ICD-10-CM | POA: Diagnosis not present

## 2017-01-27 DIAGNOSIS — Z79899 Other long term (current) drug therapy: Secondary | ICD-10-CM | POA: Diagnosis not present

## 2017-01-29 DIAGNOSIS — F4011 Social phobia, generalized: Secondary | ICD-10-CM | POA: Diagnosis not present

## 2017-01-29 DIAGNOSIS — F3131 Bipolar disorder, current episode depressed, mild: Secondary | ICD-10-CM | POA: Diagnosis not present

## 2017-02-16 DIAGNOSIS — R5383 Other fatigue: Secondary | ICD-10-CM | POA: Diagnosis not present

## 2017-02-16 DIAGNOSIS — E559 Vitamin D deficiency, unspecified: Secondary | ICD-10-CM | POA: Diagnosis not present

## 2017-02-16 DIAGNOSIS — I10 Essential (primary) hypertension: Secondary | ICD-10-CM | POA: Diagnosis not present

## 2017-02-16 DIAGNOSIS — I129 Hypertensive chronic kidney disease with stage 1 through stage 4 chronic kidney disease, or unspecified chronic kidney disease: Secondary | ICD-10-CM | POA: Diagnosis not present

## 2017-02-23 DIAGNOSIS — F3131 Bipolar disorder, current episode depressed, mild: Secondary | ICD-10-CM | POA: Diagnosis not present

## 2017-03-19 DIAGNOSIS — F4011 Social phobia, generalized: Secondary | ICD-10-CM | POA: Diagnosis not present

## 2017-03-19 DIAGNOSIS — F3131 Bipolar disorder, current episode depressed, mild: Secondary | ICD-10-CM | POA: Diagnosis not present

## 2017-04-15 DIAGNOSIS — E039 Hypothyroidism, unspecified: Secondary | ICD-10-CM | POA: Diagnosis not present

## 2017-04-15 DIAGNOSIS — I10 Essential (primary) hypertension: Secondary | ICD-10-CM | POA: Diagnosis not present

## 2017-04-15 DIAGNOSIS — Z8719 Personal history of other diseases of the digestive system: Secondary | ICD-10-CM | POA: Diagnosis not present

## 2017-04-15 DIAGNOSIS — F319 Bipolar disorder, unspecified: Secondary | ICD-10-CM | POA: Diagnosis not present

## 2017-04-15 DIAGNOSIS — R109 Unspecified abdominal pain: Secondary | ICD-10-CM | POA: Diagnosis not present

## 2017-04-15 DIAGNOSIS — R1111 Vomiting without nausea: Secondary | ICD-10-CM | POA: Diagnosis not present

## 2017-04-15 DIAGNOSIS — Z87440 Personal history of urinary (tract) infections: Secondary | ICD-10-CM | POA: Diagnosis not present

## 2017-04-15 DIAGNOSIS — Z23 Encounter for immunization: Secondary | ICD-10-CM | POA: Diagnosis not present

## 2017-04-15 DIAGNOSIS — R112 Nausea with vomiting, unspecified: Secondary | ICD-10-CM | POA: Diagnosis not present

## 2017-04-15 DIAGNOSIS — Z882 Allergy status to sulfonamides status: Secondary | ICD-10-CM | POA: Diagnosis not present

## 2017-04-15 DIAGNOSIS — Z79899 Other long term (current) drug therapy: Secondary | ICD-10-CM | POA: Diagnosis not present

## 2017-04-15 DIAGNOSIS — F419 Anxiety disorder, unspecified: Secondary | ICD-10-CM | POA: Diagnosis present

## 2017-04-15 DIAGNOSIS — G43909 Migraine, unspecified, not intractable, without status migrainosus: Secondary | ICD-10-CM | POA: Diagnosis not present

## 2017-04-15 DIAGNOSIS — Z9049 Acquired absence of other specified parts of digestive tract: Secondary | ICD-10-CM | POA: Diagnosis not present

## 2017-04-15 DIAGNOSIS — K529 Noninfective gastroenteritis and colitis, unspecified: Secondary | ICD-10-CM | POA: Diagnosis not present

## 2017-04-15 DIAGNOSIS — Z87442 Personal history of urinary calculi: Secondary | ICD-10-CM | POA: Diagnosis not present

## 2017-04-15 DIAGNOSIS — Z885 Allergy status to narcotic agent status: Secondary | ICD-10-CM | POA: Diagnosis not present

## 2017-04-15 DIAGNOSIS — R195 Other fecal abnormalities: Secondary | ICD-10-CM | POA: Diagnosis not present

## 2017-04-15 DIAGNOSIS — R319 Hematuria, unspecified: Secondary | ICD-10-CM | POA: Diagnosis not present

## 2017-04-15 DIAGNOSIS — E860 Dehydration: Secondary | ICD-10-CM | POA: Diagnosis not present

## 2017-04-15 DIAGNOSIS — N200 Calculus of kidney: Secondary | ICD-10-CM | POA: Diagnosis not present

## 2017-04-15 DIAGNOSIS — K760 Fatty (change of) liver, not elsewhere classified: Secondary | ICD-10-CM | POA: Diagnosis not present

## 2017-04-27 DIAGNOSIS — K529 Noninfective gastroenteritis and colitis, unspecified: Secondary | ICD-10-CM | POA: Diagnosis not present

## 2017-04-27 DIAGNOSIS — E559 Vitamin D deficiency, unspecified: Secondary | ICD-10-CM | POA: Diagnosis not present

## 2017-04-27 DIAGNOSIS — I10 Essential (primary) hypertension: Secondary | ICD-10-CM | POA: Diagnosis not present

## 2017-04-27 DIAGNOSIS — E039 Hypothyroidism, unspecified: Secondary | ICD-10-CM | POA: Diagnosis not present

## 2017-04-27 DIAGNOSIS — I129 Hypertensive chronic kidney disease with stage 1 through stage 4 chronic kidney disease, or unspecified chronic kidney disease: Secondary | ICD-10-CM | POA: Diagnosis not present

## 2017-04-30 ENCOUNTER — Encounter (INDEPENDENT_AMBULATORY_CARE_PROVIDER_SITE_OTHER): Payer: Self-pay | Admitting: Internal Medicine

## 2017-05-04 ENCOUNTER — Encounter (INDEPENDENT_AMBULATORY_CARE_PROVIDER_SITE_OTHER): Payer: Self-pay | Admitting: Internal Medicine

## 2017-05-04 ENCOUNTER — Ambulatory Visit (INDEPENDENT_AMBULATORY_CARE_PROVIDER_SITE_OTHER): Payer: Medicare Other | Admitting: Internal Medicine

## 2017-05-04 VITALS — BP 142/80 | HR 112 | Temp 98.0°F | Ht 62.0 in | Wt 187.7 lb

## 2017-05-04 DIAGNOSIS — K5289 Other specified noninfective gastroenteritis and colitis: Secondary | ICD-10-CM | POA: Diagnosis not present

## 2017-05-04 DIAGNOSIS — R197 Diarrhea, unspecified: Secondary | ICD-10-CM | POA: Diagnosis not present

## 2017-05-04 HISTORY — DX: Diarrhea, unspecified: R19.7

## 2017-05-04 NOTE — Patient Instructions (Signed)
Will discuss with Dr.Rehman. 

## 2017-05-04 NOTE — Progress Notes (Addendum)
Subjective:    Patient ID: [name], female    DOB: [birth year], 46 y.o.   MRN: [id number]  HPI Referred by Dr. Anastasio Champion for colitis/colonoscopy.  She was last seen by Dr. Laural Golden in December of 2017 for chronic diarrhea. Per records, she has hx of diarrhea all her life. She was started on Cholestyramine 4gm BID. She is not on this medication now.   She also underwent an esophagram for dysphagia which was normal. She was admitted to [facility] x 5 days for rectal bleeding/diarrhea. She was admitted in November. She had had abdominal pain x 2 days then started having blood stools.  She says she had a low grade fever.. Non-smoker.  She shows me a picture of blood in the toilet after a BM. . CT scan on 04/15/2017 revealed long segment of diffuse circumferential mucosal thickening of the distal ascending,entire transverse and proximal descending colon. Findings are most suggestive of infectious/inflammaotyr or ischemic colitis.  She had rectal bleeding x 2 days. She says now she has diarrhea. She says it is like water.  She is having two watery stools a day.   She tells me she ws diagnosed with coliltis at [facility].  Will get those records.  No antibiotics before admission to [facility].  She was discharged on Cipro and Flagyl.  Her last  EGD and flex sigmoid was by Dr. Oneida Alar in March of 2011. EGD revealed some food debris in the stomach and mild gastritis but biopsies were negative for H. Pylori.  Biopsy negative for celiac disease. Sigmoidoscopy revealed mild focal proctitis.   She feels 100% better. She continues with diarrhea.   04/18/2017 Stool culture negative, negative for C diff 04/20/2017 WBC 16.8, H and H 11.9 and 36.3 04/15/2017 WBC 10.8, H and H 14.1 and 42.1.   Review of Systems Past Medical History:  Diagnosis Date  . Diarrhea 05/04/2017  . Hypertension   . Kidney stones   . Renal disorder     Past Surgical History:  Procedure Laterality Date  . CHOLECYSTECTOMY    . KIDNEY STONE SURGERY     . kidney stones      Allergies  Allergen Reactions  . Morphine And Related Itching  . Sulfa Antibiotics Swelling  . Tape     Surgical     Current Outpatient Medications on File Prior to Visit  Medication Sig Dispense Refill  . ALPRAZolam (XANAX) 1 MG tablet Take 1-2 mg by mouth 2 (two) times daily. 1mg  in the morning and 1mg  at bedtime    . amLODipine (NORVASC) 5 MG tablet Take 10 mg by mouth daily.    Marland Kitchen buPROPion (WELLBUTRIN) 100 MG tablet Take by mouth daily.    . Cholecalciferol (VITAMIN D PO) Take 5,000 Units by mouth daily.     . cloNIDine (CATAPRES) 0.1 MG tablet Take 0.2 mg by mouth 2 (two) times daily.    Marland Kitchen doxepin (SINEQUAN) 75 MG capsule Take 75 mg by mouth at bedtime. Patient states that she takes 3 by mouth at night.    . lamoTRIgine (LAMICTAL) 100 MG tablet Take 100-200 mg by mouth 2 (two) times daily. `100mg  in the morning and 200mg  at bedtime    . levothyroxine (SYNTHROID, LEVOTHROID) 100 MCG tablet Take by mouth daily before breakfast.    . potassium chloride (K-DUR) 10 MEQ tablet Take 10 mEq by mouth 2 (two) times daily.    . QUEtiapine (SEROQUEL XR) 300 MG 24 hr tablet Take 400 mg by mouth at  bedtime.      No current facility-administered medications on file prior to visit.         Objective:   Physical Exam Blood pressure (!) 142/80, pulse (!) 112, temperature 98 F (36.7 C), height 5\' 2"  (1.575 m), weight 187 lb 11.2 oz (85.1 kg). Alert and oriented. Skin warm and dry. Oral mucosa is moist.   . Sclera anicteric, conjunctivae is pink. Thyroid not enlarged. No cervical lymphadenopathy. Lungs clear. Heart regular rate and rhythm.  Abdomen is soft. Bowel sounds are positive. No hepatomegaly. No abdominal masses felt. No tenderness.  No edema to lower extremities.          Assessment & Plan:  Colitis, rectal bleeding .Will get records from [facility]. Further recommendations to follow.  Colonoscopy.  CBC and Cmet.  Will discuss with Dr. Laural Golden.

## 2017-05-05 ENCOUNTER — Telehealth (INDEPENDENT_AMBULATORY_CARE_PROVIDER_SITE_OTHER): Payer: Self-pay | Admitting: Internal Medicine

## 2017-05-05 NOTE — Addendum Note (Signed)
Addended by: Butch Penny on: 05/05/2017 08:11 AM   Modules accepted: Orders, SmartSet

## 2017-05-05 NOTE — Telephone Encounter (Signed)
Ann, colonoscopy. I have spoken with patient.

## 2017-05-05 NOTE — Telephone Encounter (Signed)
err

## 2017-05-06 ENCOUNTER — Telehealth (INDEPENDENT_AMBULATORY_CARE_PROVIDER_SITE_OTHER): Payer: Self-pay | Admitting: *Deleted

## 2017-05-06 ENCOUNTER — Encounter (INDEPENDENT_AMBULATORY_CARE_PROVIDER_SITE_OTHER): Payer: Self-pay | Admitting: *Deleted

## 2017-05-06 DIAGNOSIS — K529 Noninfective gastroenteritis and colitis, unspecified: Secondary | ICD-10-CM | POA: Insufficient documentation

## 2017-05-06 DIAGNOSIS — R197 Diarrhea, unspecified: Secondary | ICD-10-CM | POA: Insufficient documentation

## 2017-05-06 MED ORDER — PEG 3350-KCL-NA BICARB-NACL 420 G PO SOLR: 4000.0000 mL | Freq: Once | ORAL | 0 refills | Status: AC

## 2017-05-06 NOTE — Telephone Encounter (Signed)
TCS sch'd 06/05/17, patient aware, instructions mailed

## 2017-05-06 NOTE — Telephone Encounter (Signed)
Patient needs trilyte 

## 2017-05-19 DIAGNOSIS — I129 Hypertensive chronic kidney disease with stage 1 through stage 4 chronic kidney disease, or unspecified chronic kidney disease: Secondary | ICD-10-CM | POA: Diagnosis not present

## 2017-05-19 DIAGNOSIS — E039 Hypothyroidism, unspecified: Secondary | ICD-10-CM | POA: Diagnosis not present

## 2017-05-19 DIAGNOSIS — I10 Essential (primary) hypertension: Secondary | ICD-10-CM | POA: Diagnosis not present

## 2017-06-05 ENCOUNTER — Ambulatory Visit (HOSPITAL_COMMUNITY)
Admission: RE | Admit: 2017-06-05 | Discharge: 2017-06-05 | Disposition: A | Discharge status: Home / Self Care | Payer: Medicare Other | Source: Ambulatory Visit | Attending: Internal Medicine | Admitting: Internal Medicine

## 2017-06-05 ENCOUNTER — Encounter (HOSPITAL_COMMUNITY): Payer: Self-pay | Admitting: *Deleted

## 2017-06-05 ENCOUNTER — Encounter (HOSPITAL_COMMUNITY)
Admission: RE | Disposition: A | Discharge status: Home / Self Care | Payer: Self-pay | Source: Ambulatory Visit | Attending: Internal Medicine

## 2017-06-05 ENCOUNTER — Other Ambulatory Visit: Payer: Self-pay

## 2017-06-05 DIAGNOSIS — E039 Hypothyroidism, unspecified: Secondary | ICD-10-CM | POA: Insufficient documentation

## 2017-06-05 DIAGNOSIS — Z882 Allergy status to sulfonamides status: Secondary | ICD-10-CM | POA: Diagnosis not present

## 2017-06-05 DIAGNOSIS — Z91048 Other nonmedicinal substance allergy status: Secondary | ICD-10-CM | POA: Insufficient documentation

## 2017-06-05 DIAGNOSIS — Z79899 Other long term (current) drug therapy: Secondary | ICD-10-CM | POA: Insufficient documentation

## 2017-06-05 DIAGNOSIS — K5289 Other specified noninfective gastroenteritis and colitis: Secondary | ICD-10-CM

## 2017-06-05 DIAGNOSIS — I10 Essential (primary) hypertension: Secondary | ICD-10-CM | POA: Diagnosis not present

## 2017-06-05 DIAGNOSIS — K529 Noninfective gastroenteritis and colitis, unspecified: Secondary | ICD-10-CM | POA: Insufficient documentation

## 2017-06-05 DIAGNOSIS — Z885 Allergy status to narcotic agent status: Secondary | ICD-10-CM | POA: Diagnosis not present

## 2017-06-05 DIAGNOSIS — Z8719 Personal history of other diseases of the digestive system: Secondary | ICD-10-CM | POA: Insufficient documentation

## 2017-06-05 DIAGNOSIS — R197 Diarrhea, unspecified: Secondary | ICD-10-CM | POA: Diagnosis not present

## 2017-06-05 DIAGNOSIS — K644 Residual hemorrhoidal skin tags: Secondary | ICD-10-CM | POA: Insufficient documentation

## 2017-06-05 HISTORY — DX: Hypothyroidism, unspecified: E03.9

## 2017-06-05 HISTORY — PX: BIOPSY: SHX5522

## 2017-06-05 HISTORY — PX: COLONOSCOPY: SHX5424

## 2017-06-05 SURGERY — COLONOSCOPY
Anesthesia: Moderate Sedation

## 2017-06-05 MED ORDER — MIDAZOLAM HCL 5 MG/5ML IJ SOLN
INTRAMUSCULAR | Status: DC | PRN
Start: 2017-06-05 — End: 2017-06-05
  Administered 2017-06-05 (×7): 2 mg via INTRAVENOUS

## 2017-06-05 MED ORDER — MIDAZOLAM HCL 5 MG/5ML IJ SOLN
INTRAMUSCULAR | Status: AC
  Filled 2017-06-05: qty 10

## 2017-06-05 MED ORDER — MEPERIDINE HCL 50 MG/ML IJ SOLN
INTRAMUSCULAR | Status: AC
  Filled 2017-06-05: qty 1

## 2017-06-05 MED ORDER — BENEFIBER DRINK MIX PO PACK: 4.0000 g | PACK | Freq: Every day | ORAL | Status: DC

## 2017-06-05 MED ORDER — SIMETHICONE 40 MG/0.6ML PO SUSP
ORAL | Status: DC | PRN
  Administered 2017-06-05: 09:00:00

## 2017-06-05 MED ORDER — SODIUM CHLORIDE 0.9 % IV SOLN
INTRAVENOUS | Status: DC
  Administered 2017-06-05: 08:00:00 via INTRAVENOUS

## 2017-06-05 MED ORDER — MEPERIDINE HCL 50 MG/ML IJ SOLN
INTRAMUSCULAR | Status: DC | PRN
  Administered 2017-06-05 (×2): 25 mg via INTRAVENOUS

## 2017-06-05 MED ORDER — POLYETHYLENE GLYCOL 3350 17 GM/SCOOP PO POWD: 8.5000 g | Freq: Every day | ORAL | 0 refills | Status: DC

## 2017-06-05 MED ORDER — MIDAZOLAM HCL 5 MG/5ML IJ SOLN
INTRAMUSCULAR | Status: AC
  Filled 2017-06-05: qty 5

## 2017-06-05 NOTE — Progress Notes (Signed)
Patient stated she has only had one solid stool since starting prep. Discussed with Dr. Laural Golden. Tap water enema ordered and given. Patient tolerated well and able to hold well. Clear return with some sediment. Dr. Laural Golden aware.

## 2017-06-05 NOTE — Discharge Instructions (Signed)
No aspirin or NSAIDs for 24 hours. Resume usual medications as before. High-fiber 4 g p.o. Nightly. Polyethylene glycol 8.5 g to 17 g p.o. nightly.  Can titrate dose. High fiber diet. No driving for 24 hours. Physician will call with biopsy results.

## 2017-06-05 NOTE — H&P (Signed)
[name] is an 47 y.o. female.   Chief Complaint: Patient is here for colonoscopy. HPI: Patient is 47 year old Caucasian female who has had diarrhea lifelong.  Testing for celiac disease was negative in 2011(duodenal biopsy).  She was admitted at [facility] in November 2018 with bloody diarrhea.  CT revealed diffuse diverticulitis.  She responded to empiric antibiotic therapy.  Ever since she has had diarrhea.  She has not had any more bleeding.  However she has been constipated since she was last hospitalized in November 2018.  Initially she thought was due to pain medication but she is not taking pain medication anymore.  She has good appetite.  She has not lost any weight. Family history is negative for CRC or IBD.  Past Medical History:  Diagnosis Date  .  Chronic diarrhea 05/04/2017  . Hypertension   . Hypothyroidism   . Kidney stones   . Renal disorder     Past Surgical History:  Procedure Laterality Date  . CHOLECYSTECTOMY    . KIDNEY STONE SURGERY    . kidney stones      Family History  Problem Relation Age of Onset  . Cancer Mother   . Heart failure Mother   . Cancer Father   . Colon cancer Neg Hx    Social History:  reports that she is a non-smoker but has been exposed to tobacco smoke. she has never used smokeless tobacco. She reports that she does not drink alcohol or use drugs.  Allergies:  Allergies  Allergen Reactions  . Morphine And Related Itching  . Sulfa Antibiotics Swelling  . Tape Other (See Comments)    Surgical     Medications Prior to Admission  Medication Sig Dispense Refill  . ALPRAZolam (XANAX) 1 MG tablet Take 1 mg by mouth 3 (three) times daily as needed for anxiety.     Marland Kitchen amLODipine (NORVASC) 5 MG tablet Take 5 mg by mouth 2 (two) times daily.     Marland Kitchen buPROPion (WELLBUTRIN SR) 150 MG 12 hr tablet Take 150 mg by mouth every morning.  2  . Cholecalciferol (VITAMIN D3) 1000 units CAPS Take 1,000 Units by mouth daily.    . cloNIDine (CATAPRES) 0.3 MG  tablet Take 0.3 mg by mouth 2 (two) times daily.  1  . doxepin (SINEQUAN) 75 MG capsule Take 225 mg by mouth at bedtime.     Marland Kitchen ibuprofen (ADVIL,MOTRIN) 800 MG tablet Take 800 mg by mouth every 8 (eight) hours as needed for headache.    . lamoTRIgine (LAMICTAL) 200 MG tablet Take 200 mg by mouth 2 (two) times daily.     Marland Kitchen levothyroxine (SYNTHROID, LEVOTHROID) 25 MCG tablet Take 25 mcg by mouth daily before breakfast.     . potassium chloride (K-DUR) 10 MEQ tablet Take 20 mEq by mouth daily.     . QUEtiapine (SEROQUEL XR) 400 MG 24 hr tablet Take 400 mg by mouth at bedtime.       No results found for this or any previous visit (from the past 48 hour(s)). No results found.  ROS  Blood pressure (!) 127/98, pulse 99, temperature 97.9 F (36.6 C), temperature source Oral, resp. rate 17, height 5\' 2"  (1.575 m), weight 187 lb (84.8 kg), last menstrual period 05/21/2017, SpO2 98 %. Physical Exam  Constitutional: She appears well-developed and well-nourished.  HENT:  Mouth/Throat: Oropharynx is clear and moist.  Eyes: Conjunctivae are normal. No scleral icterus.  Neck: No thyromegaly present.  Cardiovascular: Normal rate, regular  rhythm and normal heart sounds.  No murmur heard. Respiratory: Effort normal and breath sounds normal.  GI: Soft. She exhibits no distension and no mass. There is no tenderness.  Musculoskeletal: She exhibits no edema.  Lymphadenopathy:    She has no cervical adenopathy.  Neurological: She is alert.  Skin: Skin is warm and dry.     Assessment/Plan History of colitis. Diagnostic colonoscopy.  Hildred Laser, MD 06/05/2017, 9:03 AM

## 2017-06-05 NOTE — Op Note (Signed)
[facility] Patient Name: [name] Procedure Date: 06/05/2017 8:53 AM MRN: [id number] Date of Birth: August 21, 1970 Attending MD: Hildred Laser , MD CSN: [id number] Age: 47 Admit Type: Outpatient Procedure:                Colonoscopy Indications:              Follow-up of colitis, Chronic diarrhea Providers:                Hildred Laser, MD, Otis Peak B. Sharon Seller, RN, Starla Link RN, RN Referring MD:             Doree Albee, MD Medicines:                Meperidine 50 mg IV, Midazolam 14 mg IV Complications:            No immediate complications. Estimated Blood Loss:     Estimated blood loss was minimal. Procedure:                Pre-Anesthesia Assessment:                           - Prior to the procedure, a History and Physical                            was performed, and patient medications and                            allergies were reviewed. The patient's tolerance of                            previous anesthesia was also reviewed. The risks                            and benefits of the procedure and the sedation                            options and risks were discussed with the patient.                            All questions were answered, and informed consent                            was obtained. Prior Anticoagulants: The patient has                            taken no previous anticoagulant or antiplatelet                            agents. ASA Grade Assessment: II - A patient with                            mild systemic disease. After reviewing the risks  and benefits, the patient was deemed in                            satisfactory condition to undergo the procedure.                           After obtaining informed consent, the colonoscope                            was passed under direct vision. Throughout the                            procedure, the patient's blood pressure, pulse, and                             oxygen saturations were monitored continuously. The                            EC-[id number] ([id number]) scope was introduced through                            the anus and advanced to the the cecum, identified                            by appendiceal orifice and ileocecal valve. The                            colonoscopy was performed without difficulty. The                            patient tolerated the procedure well. The quality                            of the bowel preparation was inadequate. The                            ileocecal valve and the rectum were photographed. Scope In: 9:16:26 AM Scope Out: 9:34:09 AM Total Procedure Duration: 0 hours 17 minutes 43 seconds  Findings:      The perianal and digital rectal examinations were normal.      The rectum, recto-sigmoid colon, sigmoid colon, descending colon and       splenic flexure appeared normal.      A moderate amount of stool was found in the transverse colon, at the       hepatic flexure, in the ascending colon and in the cecum, precluding       visualization.      Random biopsies taken from mucosa of ascending and sigmoid colon.       Biopsies for histology were taken with a cold forceps from the ascending       colon and sigmoid colon for evaluation of microscopic colitis.      External hemorrhoids were found during retroflexion. The hemorrhoids       were small. Impression:               - Preparation of the colon was  inadequate.                           - The rectum, recto-sigmoid colon, sigmoid colon,                            descending colon and splenic flexure are normal.                           - Stool in the transverse colon, at the hepatic                            flexure, in the ascending colon and in the cecum.                           - Random biopsies taken from mucosa of ascending                            and sigmoid colon.                           - External  hemorrhoids. Moderate Sedation:      Moderate (conscious) sedation was administered by the endoscopy nurse       and supervised by the endoscopist. The following parameters were       monitored: oxygen saturation, heart rate, blood pressure, CO2       capnography and response to care. Total physician intraservice time was       26 minutes. Recommendation:           - Patient has a contact number available for                            emergencies. The signs and symptoms of potential                            delayed complications were discussed with the                            patient. Return to normal activities tomorrow.                            Written discharge instructions were provided to the                            patient.                           - High fiber diet today.                           - Continue present medications.                           - No aspirin, ibuprofen, naproxen, or other  non-steroidal anti-inflammatory drugs for 1 day.                           - Benefiber 4 g po qhs.                           - Miralax as directed.                           - Await pathology results.                           - Repeat colonoscopy in 5 years for screening                            purposes. Procedure Code(s):        --- Professional ---                           [phone], Colonoscopy, flexible; with biopsy, single                            or multiple                           99152, Moderate sedation services provided by the                            same physician or other qualified health care                            professional performing the diagnostic or                            therapeutic service that the sedation supports,                            requiring the presence of an independent trained                            observer to assist in the monitoring of the                            patient's level of  consciousness and physiological                            status; initial 15 minutes of intraservice time,                            patient age 59 years or older                           [phone], Moderate sedation services; each additional                            15 minutes intraservice time Diagnosis Code(s):        ---  Professional ---                           K64.4, Residual hemorrhoidal skin tags                           K52.9, Noninfective gastroenteritis and colitis,                            unspecified CPT copyright 2016 American [employer] Association. All rights reserved. The codes documented in this report are preliminary and upon coder review may  be revised to meet current compliance requirements. Hildred Laser, MD Hildred Laser, MD 06/05/2017 9:47:49 AM This report has been signed electronically. Number of Addenda: 0

## 2017-06-09 ENCOUNTER — Encounter (HOSPITAL_COMMUNITY): Payer: Self-pay | Admitting: Internal Medicine

## 2017-06-17 DIAGNOSIS — F4011 Social phobia, generalized: Secondary | ICD-10-CM | POA: Diagnosis not present

## 2017-06-17 DIAGNOSIS — F3131 Bipolar disorder, current episode depressed, mild: Secondary | ICD-10-CM | POA: Diagnosis not present

## 2017-06-17 DIAGNOSIS — F5081 Binge eating disorder: Secondary | ICD-10-CM | POA: Diagnosis not present

## 2017-06-24 DIAGNOSIS — Z01419 Encounter for gynecological examination (general) (routine) without abnormal findings: Secondary | ICD-10-CM | POA: Diagnosis not present

## 2017-06-24 DIAGNOSIS — Z124 Encounter for screening for malignant neoplasm of cervix: Secondary | ICD-10-CM | POA: Diagnosis not present

## 2017-06-24 DIAGNOSIS — Z1289 Encounter for screening for malignant neoplasm of other sites: Secondary | ICD-10-CM | POA: Diagnosis not present

## 2017-07-07 DIAGNOSIS — R6882 Decreased libido: Secondary | ICD-10-CM | POA: Diagnosis not present

## 2017-07-07 DIAGNOSIS — E039 Hypothyroidism, unspecified: Secondary | ICD-10-CM | POA: Diagnosis not present

## 2017-07-07 DIAGNOSIS — I10 Essential (primary) hypertension: Secondary | ICD-10-CM | POA: Diagnosis not present

## 2017-07-07 DIAGNOSIS — R5383 Other fatigue: Secondary | ICD-10-CM | POA: Diagnosis not present

## 2017-07-09 DIAGNOSIS — Z1231 Encounter for screening mammogram for malignant neoplasm of breast: Secondary | ICD-10-CM | POA: Diagnosis not present

## 2017-08-19 DIAGNOSIS — I10 Essential (primary) hypertension: Secondary | ICD-10-CM | POA: Diagnosis not present

## 2017-08-19 DIAGNOSIS — E039 Hypothyroidism, unspecified: Secondary | ICD-10-CM | POA: Diagnosis not present

## 2017-09-08 DIAGNOSIS — I10 Essential (primary) hypertension: Secondary | ICD-10-CM | POA: Diagnosis not present

## 2017-09-08 DIAGNOSIS — E039 Hypothyroidism, unspecified: Secondary | ICD-10-CM | POA: Diagnosis not present

## 2017-09-10 DIAGNOSIS — I10 Essential (primary) hypertension: Secondary | ICD-10-CM | POA: Diagnosis not present

## 2017-09-10 DIAGNOSIS — R5383 Other fatigue: Secondary | ICD-10-CM | POA: Diagnosis not present

## 2017-11-26 DIAGNOSIS — F5081 Binge eating disorder: Secondary | ICD-10-CM | POA: Diagnosis not present

## 2017-11-26 DIAGNOSIS — F314 Bipolar disorder, current episode depressed, severe, without psychotic features: Secondary | ICD-10-CM | POA: Diagnosis not present

## 2017-11-26 DIAGNOSIS — F4011 Social phobia, generalized: Secondary | ICD-10-CM | POA: Diagnosis not present

## 2017-12-07 DIAGNOSIS — F319 Bipolar disorder, unspecified: Secondary | ICD-10-CM | POA: Diagnosis not present

## 2017-12-07 DIAGNOSIS — N10 Acute tubulo-interstitial nephritis: Secondary | ICD-10-CM | POA: Diagnosis not present

## 2017-12-07 DIAGNOSIS — I10 Essential (primary) hypertension: Secondary | ICD-10-CM | POA: Diagnosis not present

## 2017-12-07 DIAGNOSIS — Z87442 Personal history of urinary calculi: Secondary | ICD-10-CM | POA: Diagnosis not present

## 2017-12-07 DIAGNOSIS — K581 Irritable bowel syndrome with constipation: Secondary | ICD-10-CM | POA: Diagnosis not present

## 2017-12-07 DIAGNOSIS — R1031 Right lower quadrant pain: Secondary | ICD-10-CM | POA: Diagnosis not present

## 2017-12-07 DIAGNOSIS — E876 Hypokalemia: Secondary | ICD-10-CM | POA: Diagnosis not present

## 2017-12-07 DIAGNOSIS — R1032 Left lower quadrant pain: Secondary | ICD-10-CM | POA: Diagnosis not present

## 2017-12-07 DIAGNOSIS — Z79899 Other long term (current) drug therapy: Secondary | ICD-10-CM | POA: Diagnosis not present

## 2017-12-07 DIAGNOSIS — R109 Unspecified abdominal pain: Secondary | ICD-10-CM | POA: Diagnosis not present

## 2017-12-07 DIAGNOSIS — F419 Anxiety disorder, unspecified: Secondary | ICD-10-CM | POA: Diagnosis not present

## 2017-12-07 DIAGNOSIS — N12 Tubulo-interstitial nephritis, not specified as acute or chronic: Secondary | ICD-10-CM | POA: Diagnosis not present

## 2017-12-09 DIAGNOSIS — I10 Essential (primary) hypertension: Secondary | ICD-10-CM | POA: Diagnosis not present

## 2017-12-09 DIAGNOSIS — I129 Hypertensive chronic kidney disease with stage 1 through stage 4 chronic kidney disease, or unspecified chronic kidney disease: Secondary | ICD-10-CM | POA: Diagnosis not present

## 2017-12-09 DIAGNOSIS — E039 Hypothyroidism, unspecified: Secondary | ICD-10-CM | POA: Diagnosis not present

## 2018-02-23 DIAGNOSIS — F4011 Social phobia, generalized: Secondary | ICD-10-CM | POA: Diagnosis not present

## 2018-02-23 DIAGNOSIS — F3131 Bipolar disorder, current episode depressed, mild: Secondary | ICD-10-CM | POA: Diagnosis not present

## 2018-07-05 DIAGNOSIS — F5081 Binge eating disorder: Secondary | ICD-10-CM | POA: Diagnosis not present

## 2018-07-05 DIAGNOSIS — F4011 Social phobia, generalized: Secondary | ICD-10-CM | POA: Diagnosis not present

## 2018-07-05 DIAGNOSIS — F3131 Bipolar disorder, current episode depressed, mild: Secondary | ICD-10-CM | POA: Diagnosis not present

## 2018-07-06 DIAGNOSIS — Z01419 Encounter for gynecological examination (general) (routine) without abnormal findings: Secondary | ICD-10-CM | POA: Diagnosis not present

## 2018-07-12 DIAGNOSIS — I10 Essential (primary) hypertension: Secondary | ICD-10-CM | POA: Diagnosis not present

## 2018-07-12 DIAGNOSIS — E559 Vitamin D deficiency, unspecified: Secondary | ICD-10-CM | POA: Diagnosis not present

## 2018-07-12 DIAGNOSIS — R5383 Other fatigue: Secondary | ICD-10-CM | POA: Diagnosis not present

## 2018-07-12 DIAGNOSIS — E039 Hypothyroidism, unspecified: Secondary | ICD-10-CM | POA: Diagnosis not present

## 2018-07-12 DIAGNOSIS — E785 Hyperlipidemia, unspecified: Secondary | ICD-10-CM | POA: Diagnosis not present

## 2018-08-11 DIAGNOSIS — R922 Inconclusive mammogram: Secondary | ICD-10-CM | POA: Diagnosis not present

## 2018-08-11 DIAGNOSIS — N644 Mastodynia: Secondary | ICD-10-CM | POA: Diagnosis not present

## 2018-08-15 DIAGNOSIS — N200 Calculus of kidney: Secondary | ICD-10-CM | POA: Diagnosis not present

## 2018-08-15 DIAGNOSIS — N390 Urinary tract infection, site not specified: Secondary | ICD-10-CM | POA: Diagnosis not present

## 2018-08-15 DIAGNOSIS — Z79899 Other long term (current) drug therapy: Secondary | ICD-10-CM | POA: Diagnosis not present

## 2018-08-26 ENCOUNTER — Encounter (INDEPENDENT_AMBULATORY_CARE_PROVIDER_SITE_OTHER): Payer: Self-pay | Admitting: Internal Medicine

## 2018-08-30 DIAGNOSIS — R5383 Other fatigue: Secondary | ICD-10-CM | POA: Diagnosis not present

## 2018-08-30 DIAGNOSIS — I10 Essential (primary) hypertension: Secondary | ICD-10-CM | POA: Diagnosis not present

## 2018-08-30 DIAGNOSIS — E669 Obesity, unspecified: Secondary | ICD-10-CM | POA: Diagnosis not present

## 2018-08-30 DIAGNOSIS — N390 Urinary tract infection, site not specified: Secondary | ICD-10-CM | POA: Diagnosis not present

## 2018-08-30 DIAGNOSIS — E039 Hypothyroidism, unspecified: Secondary | ICD-10-CM | POA: Diagnosis not present

## 2018-08-30 DIAGNOSIS — E785 Hyperlipidemia, unspecified: Secondary | ICD-10-CM | POA: Diagnosis not present

## 2018-09-17 DIAGNOSIS — E039 Hypothyroidism, unspecified: Secondary | ICD-10-CM | POA: Diagnosis not present

## 2018-09-17 DIAGNOSIS — I129 Hypertensive chronic kidney disease with stage 1 through stage 4 chronic kidney disease, or unspecified chronic kidney disease: Secondary | ICD-10-CM | POA: Diagnosis not present

## 2018-10-04 DIAGNOSIS — F3131 Bipolar disorder, current episode depressed, mild: Secondary | ICD-10-CM | POA: Diagnosis not present

## 2018-10-04 DIAGNOSIS — F4011 Social phobia, generalized: Secondary | ICD-10-CM | POA: Diagnosis not present

## 2018-10-06 DIAGNOSIS — R6882 Decreased libido: Secondary | ICD-10-CM | POA: Diagnosis not present

## 2018-10-11 ENCOUNTER — Ambulatory Visit (INDEPENDENT_AMBULATORY_CARE_PROVIDER_SITE_OTHER): Payer: Medicare Other | Admitting: Internal Medicine

## 2018-10-11 DIAGNOSIS — E039 Hypothyroidism, unspecified: Secondary | ICD-10-CM | POA: Diagnosis not present

## 2018-10-11 DIAGNOSIS — E785 Hyperlipidemia, unspecified: Secondary | ICD-10-CM | POA: Diagnosis not present

## 2018-10-11 DIAGNOSIS — I10 Essential (primary) hypertension: Secondary | ICD-10-CM | POA: Diagnosis not present

## 2018-10-11 DIAGNOSIS — E559 Vitamin D deficiency, unspecified: Secondary | ICD-10-CM | POA: Diagnosis not present

## 2019-01-27 DIAGNOSIS — E669 Obesity, unspecified: Secondary | ICD-10-CM | POA: Diagnosis not present

## 2019-01-27 DIAGNOSIS — I10 Essential (primary) hypertension: Secondary | ICD-10-CM | POA: Diagnosis not present

## 2019-01-27 DIAGNOSIS — E039 Hypothyroidism, unspecified: Secondary | ICD-10-CM | POA: Diagnosis not present

## 2019-02-03 DIAGNOSIS — F5081 Binge eating disorder: Secondary | ICD-10-CM | POA: Diagnosis not present

## 2019-02-03 DIAGNOSIS — F4011 Social phobia, generalized: Secondary | ICD-10-CM | POA: Diagnosis not present

## 2019-02-03 DIAGNOSIS — F3131 Bipolar disorder, current episode depressed, mild: Secondary | ICD-10-CM | POA: Diagnosis not present

## 2019-02-15 ENCOUNTER — Other Ambulatory Visit (INDEPENDENT_AMBULATORY_CARE_PROVIDER_SITE_OTHER): Payer: Self-pay | Admitting: Internal Medicine

## 2019-03-15 ENCOUNTER — Encounter (INDEPENDENT_AMBULATORY_CARE_PROVIDER_SITE_OTHER): Payer: Self-pay | Admitting: Internal Medicine

## 2019-03-15 ENCOUNTER — Other Ambulatory Visit: Payer: Self-pay

## 2019-03-15 ENCOUNTER — Ambulatory Visit (INDEPENDENT_AMBULATORY_CARE_PROVIDER_SITE_OTHER): Payer: Medicare Other | Admitting: Internal Medicine

## 2019-03-15 DIAGNOSIS — E559 Vitamin D deficiency, unspecified: Secondary | ICD-10-CM

## 2019-03-15 DIAGNOSIS — Z23 Encounter for immunization: Secondary | ICD-10-CM | POA: Diagnosis not present

## 2019-03-15 DIAGNOSIS — N183 Chronic kidney disease, stage 3 unspecified: Secondary | ICD-10-CM | POA: Insufficient documentation

## 2019-03-15 DIAGNOSIS — N1831 Chronic kidney disease, stage 3a: Secondary | ICD-10-CM | POA: Diagnosis not present

## 2019-03-15 DIAGNOSIS — E669 Obesity, unspecified: Secondary | ICD-10-CM | POA: Diagnosis not present

## 2019-03-15 DIAGNOSIS — I10 Essential (primary) hypertension: Secondary | ICD-10-CM | POA: Diagnosis not present

## 2019-03-15 DIAGNOSIS — E039 Hypothyroidism, unspecified: Secondary | ICD-10-CM | POA: Diagnosis not present

## 2019-03-15 DIAGNOSIS — E038 Other specified hypothyroidism: Secondary | ICD-10-CM | POA: Insufficient documentation

## 2019-03-15 HISTORY — DX: Essential (primary) hypertension: I10

## 2019-03-15 HISTORY — DX: Obesity, unspecified: E66.9

## 2019-03-15 HISTORY — DX: Vitamin D deficiency, unspecified: E55.9

## 2019-03-15 HISTORY — DX: Other specified hypothyroidism: E03.8

## 2019-03-15 HISTORY — DX: Chronic kidney disease, stage 3 unspecified: N18.30

## 2019-03-15 HISTORY — DX: Hypothyroidism, unspecified: E03.9

## 2019-03-15 NOTE — Progress Notes (Signed)
Wellness Office Visit  Subjective:  Patient ID: [name], female    DOB: [birth year]  Age: 48 y.o. MRN: [id number]  CC: This lady comes in for follow-up of her multiple medical problems including hypothyroidism, hypertension, chronic kidney disease, obesity. HPI Unfortunately, she could not afford the NP thyroid in the dose that I would like her to take.  As a result, she is only taking NP thyroid 60 mg once a day as opposed to twice a day. She feels that she has no energy, sleeps most of the daytime unfortunately. She has been taking vitamin D3 supplementation for vitamin D deficiency. She continues with antihypertensive therapy as before at the same doses.  She denies any chest pain, dyspnea, palpitations or limb weakness.  Past Medical History:  Diagnosis Date  . CKD (chronic kidney disease) stage 3, GFR 30-59 ml/min 03/15/2019  . Diarrhea 05/04/2017  . Essential hypertension, benign 03/15/2019  . Hypertension   . Hypothyroidism, adult 03/15/2019  . Kidney stones   . Obesity (BMI 30-39.9) 03/15/2019  . Renal disorder   . Vitamin D deficiency disease 03/15/2019      Family History  Problem Relation Age of Onset  . Cancer Mother   . Heart failure Mother   . Cancer Father   . Colon cancer Neg Hx     Social History   Social History Narrative   Divorced twice,1st lasted 5 years,2nd 4 years.On disabilty secondary to mental illness since 2004.Previously used to [employer].Lives alone,has 2 cats.     Current Meds  Medication Sig  . ALPRAZolam (XANAX) 1 MG tablet Take 1 mg by mouth 3 (three) times daily as needed for anxiety.   Marland Kitchen amLODipine (NORVASC) 5 MG tablet TAKE 1 TABLET BY MOUTH TWICE DAILY  . ARIPiprazole (ABILIFY) 5 MG tablet Take 5 mg by mouth daily.  . Cholecalciferol (VITAMIN D3) 125 MCG (5000 UT) TABS Take 5,000 Units by mouth daily.   . cloNIDine (CATAPRES) 0.3 MG tablet Take 0.3 mg by mouth 2 (two) times daily.  Marland Kitchen doxepin (SINEQUAN) 75 MG  capsule Take 225 mg by mouth at bedtime.   . lamoTRIgine (LAMICTAL) 200 MG tablet Take 200 mg by mouth 2 (two) times daily.   . medroxyPROGESTERone (DEPO-PROVERA) 150 MG/ML injection Inject 150 mg into the muscle every 3 (three) months.  . NP THYROID 60 MG tablet Take 60 mg by mouth daily before breakfast.  . potassium chloride (K-DUR) 10 MEQ tablet Take 20 mEq by mouth daily.   . QUEtiapine (SEROQUEL XR) 400 MG 24 hr tablet Take 400 mg by mouth at bedtime.      Nutrition  She has been fasting usually 15 hours every day and trying to do better with this. Sleep  Very variable.  Exercise  None. Bio Identical Hormones  None.  Objective:   Today's Vitals: BP 140/88   Pulse 80   Ht 5\' 1"  (1.549 m)   Wt 207 lb 9.6 oz (94.2 kg)   BMI 39.23 kg/m  Vitals with BMI 03/15/2019 06/05/2017 06/05/2017  Height 5\' 1"  - -  Weight 207 lbs 10 oz - -  BMI [id number] - -  Systolic [id number]  Diastolic 88 81 80  Pulse 80 100 97     Physical Exam       Assessment   1. Essential hypertension, benign   2. Hypothyroidism, adult   3. Obesity (BMI 30-39.9)   4. Vitamin D deficiency disease   5. Stage 3a  chronic kidney disease       Tests ordered Orders Placed This Encounter  Procedures  . COMPLETE METABOLIC PANEL WITH GFR  . VITAMIN D 25 Hydroxy (Vit-D Deficiency, Fractures)  . T3, free  . TSH     Plan: 1. She will continue with antihypertensive therapy, blood pressure elevated today, will need to monitor.  It has been difficult to control her blood pressure with several different medications.  If she can lose further weight, I think this will actually help solve the problem. 2. Continue with NP thyroid dose of 60 mg daily as before.  We will check thyroid levels today. 3. Continue with vitamin D3 supplementation today and check levels again today. 4. We will continue to monitor renal function which has been stable and I have encouraged her to make sure she stays well-hydrated  while she does fasting. 5. Further recommendations will depend on blood results and I will see her in about 3 months time for follow-up.  Today, she was given influenza vaccination   No orders of the defined types were placed in this encounter.   Doree Albee, MD

## 2019-03-16 ENCOUNTER — Other Ambulatory Visit (INDEPENDENT_AMBULATORY_CARE_PROVIDER_SITE_OTHER): Payer: Self-pay | Admitting: Internal Medicine

## 2019-03-16 ENCOUNTER — Telehealth (INDEPENDENT_AMBULATORY_CARE_PROVIDER_SITE_OTHER): Payer: Self-pay | Admitting: Internal Medicine

## 2019-03-16 LAB — COMPLETE METABOLIC PANEL WITH GFR
AG Ratio: 1.4 (calc) (ref 1.0–2.5)
ALT: 19 U/L (ref 6–29)
AST: 13 U/L (ref 10–35)
Albumin: 4.2 g/dL (ref 3.6–5.1)
Alkaline phosphatase (APISO): 125 U/L (ref 31–125)
BUN/Creatinine Ratio: 13 (calc) (ref 6–22)
BUN: 15 mg/dL (ref 7–25)
CO2: 20 mmol/L (ref 20–32)
Calcium: 9.5 mg/dL (ref 8.6–10.2)
Chloride: 108 mmol/L (ref 98–110)
Creat: 1.2 mg/dL — ABNORMAL HIGH (ref 0.50–1.10)
GFR, Est African American: 62 mL/min/{1.73_m2} (ref >=60)
GFR, Est Non African American: 54 mL/min/{1.73_m2} — ABNORMAL LOW (ref >=60)
Globulin: 2.9 g/dL (calc) (ref 1.9–3.7)
Glucose, Bld: 74 mg/dL (ref 65–99)
Potassium: 4 mmol/L (ref 3.5–5.3)
Sodium: 139 mmol/L (ref 135–146)
Total Bilirubin: 0.5 mg/dL (ref 0.2–1.2)
Total Protein: 7.1 g/dL (ref 6.1–8.1)

## 2019-03-16 LAB — T3, FREE: T3, Free: 3.1 pg/mL (ref 2.3–4.2)

## 2019-03-16 LAB — VITAMIN D 25 HYDROXY (VIT D DEFICIENCY, FRACTURES): Vit D, 25-Hydroxy: 43 ng/mL (ref 30–100)

## 2019-03-16 LAB — TSH: TSH: 1.86 mIU/L

## 2019-03-16 MED ORDER — NP THYROID 60 MG PO TABS: 60.0000 mg | ORAL_TABLET | Freq: Every day | ORAL | 3 refills | Status: DC

## 2019-03-16 NOTE — Telephone Encounter (Signed)
Done

## 2019-03-21 DIAGNOSIS — S3991XA Unspecified injury of abdomen, initial encounter: Secondary | ICD-10-CM | POA: Diagnosis not present

## 2019-03-21 DIAGNOSIS — F319 Bipolar disorder, unspecified: Secondary | ICD-10-CM | POA: Diagnosis not present

## 2019-03-21 DIAGNOSIS — Z87440 Personal history of urinary (tract) infections: Secondary | ICD-10-CM | POA: Diagnosis not present

## 2019-03-21 DIAGNOSIS — M545 Low back pain: Secondary | ICD-10-CM | POA: Diagnosis not present

## 2019-03-21 DIAGNOSIS — I10 Essential (primary) hypertension: Secondary | ICD-10-CM | POA: Diagnosis not present

## 2019-03-21 DIAGNOSIS — N3090 Cystitis, unspecified without hematuria: Secondary | ICD-10-CM | POA: Diagnosis not present

## 2019-03-21 DIAGNOSIS — F419 Anxiety disorder, unspecified: Secondary | ICD-10-CM | POA: Diagnosis not present

## 2019-03-21 DIAGNOSIS — S3993XA Unspecified injury of pelvis, initial encounter: Secondary | ICD-10-CM | POA: Diagnosis not present

## 2019-03-21 DIAGNOSIS — Z87442 Personal history of urinary calculi: Secondary | ICD-10-CM | POA: Diagnosis not present

## 2019-03-21 DIAGNOSIS — Z79899 Other long term (current) drug therapy: Secondary | ICD-10-CM | POA: Diagnosis not present

## 2019-04-14 ENCOUNTER — Other Ambulatory Visit (INDEPENDENT_AMBULATORY_CARE_PROVIDER_SITE_OTHER): Payer: Self-pay | Admitting: Internal Medicine

## 2019-05-04 ENCOUNTER — Encounter (INDEPENDENT_AMBULATORY_CARE_PROVIDER_SITE_OTHER): Payer: Self-pay | Admitting: Internal Medicine

## 2019-05-09 ENCOUNTER — Ambulatory Visit (INDEPENDENT_AMBULATORY_CARE_PROVIDER_SITE_OTHER): Payer: Medicare Other

## 2019-05-10 ENCOUNTER — Ambulatory Visit (INDEPENDENT_AMBULATORY_CARE_PROVIDER_SITE_OTHER): Payer: Medicare Other

## 2019-05-10 DIAGNOSIS — Z309 Encounter for contraceptive management, unspecified: Secondary | ICD-10-CM

## 2019-05-10 MED ORDER — MEDROXYPROGESTERONE ACETATE 150 MG/ML IM SUSP: 150.0000 mg | Freq: Once | INTRAMUSCULAR | Status: DC

## 2019-05-10 NOTE — Progress Notes (Signed)
Pt was given Medroxyprogesterone IM 150 mg to the Left deltoid. Pt tolerated well. No complaints.  Pt brought medications to office to be administered by RMA.

## 2019-06-12 ENCOUNTER — Other Ambulatory Visit (INDEPENDENT_AMBULATORY_CARE_PROVIDER_SITE_OTHER): Payer: Self-pay | Admitting: Internal Medicine

## 2019-06-15 ENCOUNTER — Ambulatory Visit (INDEPENDENT_AMBULATORY_CARE_PROVIDER_SITE_OTHER): Payer: Medicare Other | Admitting: Internal Medicine

## 2019-06-16 ENCOUNTER — Encounter (INDEPENDENT_AMBULATORY_CARE_PROVIDER_SITE_OTHER): Payer: Self-pay | Admitting: Internal Medicine

## 2019-06-16 ENCOUNTER — Other Ambulatory Visit: Payer: Self-pay

## 2019-06-16 ENCOUNTER — Ambulatory Visit (INDEPENDENT_AMBULATORY_CARE_PROVIDER_SITE_OTHER): Payer: Medicare Other | Admitting: Internal Medicine

## 2019-06-16 VITALS — BP 140/80 | HR 109 | Temp 98.0°F | Resp 18 | Ht 63.0 in | Wt 209.0 lb

## 2019-06-16 DIAGNOSIS — E669 Obesity, unspecified: Secondary | ICD-10-CM | POA: Diagnosis not present

## 2019-06-16 DIAGNOSIS — E039 Hypothyroidism, unspecified: Secondary | ICD-10-CM

## 2019-06-16 DIAGNOSIS — I10 Essential (primary) hypertension: Secondary | ICD-10-CM

## 2019-06-16 NOTE — Progress Notes (Signed)
Metrics: Intervention Frequency ACO  Documented Smoking Status Yearly  Screened one or more times in 24 months  Cessation Counseling or  Active cessation medication Past 24 months  Past 24 months   Guideline developer: UpToDate (See UpToDate for funding source) Date Released: 2014       Wellness Office Visit  Subjective:  Patient ID: [name], female    DOB: [birth year]  Age: 49 y.o. MRN: [id number]  CC: This lady comes in for follow-up of hypothyroidism, chronic kidney disease, hypertension and obesity. HPI Unfortunately, she is unable to be consistent with taking NP thyroid because of cost.  She has many medications that she also takes and that she has to pay for. She has no other specific complaints today.  Past Medical History:  Diagnosis Date  . CKD (chronic kidney disease) stage 3, GFR 30-59 ml/min 03/15/2019  . Diarrhea 05/04/2017  . Essential hypertension, benign 03/15/2019  . Hypertension   . Hypothyroidism, adult 03/15/2019  . Kidney stones   . Obesity (BMI 30-39.9) 03/15/2019  . Renal disorder   . Vitamin D deficiency disease 03/15/2019      Family History  Problem Relation Age of Onset  . Cancer Mother   . Heart failure Mother   . Cancer Father   . Colon cancer Neg Hx     Social History   Social History Narrative   Divorced twice,1st lasted 5 years,2nd 4 years.On disabilty secondary to mental illness since 2004.Previously used to [employer].Lives alone,has 2 cats.   Social History   Tobacco Use  . Smoking status: Passive Smoke Exposure - Never Smoker  . Smokeless tobacco: Never Used  Substance Use Topics  . Alcohol use: No    Current Meds  Medication Sig  . ALPRAZolam (XANAX) 1 MG tablet Take 1 mg by mouth 3 (three) times daily as needed for anxiety.   Marland Kitchen amLODipine (NORVASC) 5 MG tablet TAKE 1 TABLET BY MOUTH TWICE DAILY  . ARIPiprazole (ABILIFY) 5 MG tablet Take 5 mg by mouth daily.  . Cholecalciferol (VITAMIN D3) 125 MCG (5000  UT) TABS Take 5,000 Units by mouth daily.   . cloNIDine (CATAPRES) 0.3 MG tablet TAKE 1 TABLET BY MOUTH TWICE DAILY  . doxepin (SINEQUAN) 75 MG capsule Take 225 mg by mouth at bedtime.   . lamoTRIgine (LAMICTAL) 200 MG tablet Take 200 mg by mouth 2 (two) times daily.   . medroxyPROGESTERone (DEPO-PROVERA) 150 MG/ML injection Inject 150 mg into the muscle every 3 (three) months.  . metoprolol succinate (TOPROL-XL) 25 MG 24 hr tablet TAKE 1 TABLET BY MOUTH EVERY DAY  . NP THYROID 60 MG tablet Take 1 tablet (60 mg total) by mouth daily before breakfast.  . potassium chloride (K-DUR) 10 MEQ tablet Take 20 mEq by mouth daily.   . QUEtiapine (SEROQUEL XR) 400 MG 24 hr tablet Take 400 mg by mouth at bedtime.    Current Facility-Administered Medications for the 06/16/19 encounter (Office Visit) with Doree Albee, MD  Medication  . medroxyPROGESTERone (DEPO-PROVERA) injection 150 mg      Objective:   Today's Vitals: BP 140/80 (BP Location: Right Arm, Patient Position: Sitting, Cuff Size: Normal)   Pulse (!) 109   Temp 98 F (36.7 C) (Temporal)   Resp 18   Ht 5\' 3"  (1.6 m)   Wt 209 lb (94.8 kg)   SpO2 97% Comment: wearing mask.  BMI 37.02 kg/m  Vitals with BMI 06/16/2019 03/15/2019 06/05/2017  Height 5\' 3"  5\' 1"  -  Weight 209 lbs 207 lbs 10 oz -  BMI [id number] -  Systolic [id number]  Diastolic 80 88 81  Pulse 0000000 80 100     Physical Exam   She looks systemically well.  Blood pressure is better than last time and for her is not too bad actually.  She is alert and orientated without any focal neurological signs.    Assessment   1. Essential hypertension, benign   2. Hypothyroidism, adult   3. Obesity (BMI 30-39.9)       Tests ordered No orders of the defined types were placed in this encounter.    Plan: 1. I gave her samples of NP thyroid 60 mg tablets, to take 1 daily and I have given her 89-month supply. 2. She will continue with antihypertensive therapy as  before. 3. She will continue to work on her obesity in terms of nutrition although this is challenging for her and she does only eat once or twice a day so I think her metabolism is quite slow. 4. Follow-up in about 6 weeks time and we will do blood work then.   No orders of the defined types were placed in this encounter.   Doree Albee, MD

## 2019-06-28 ENCOUNTER — Encounter (INDEPENDENT_AMBULATORY_CARE_PROVIDER_SITE_OTHER): Payer: Self-pay

## 2019-07-07 DIAGNOSIS — F3131 Bipolar disorder, current episode depressed, mild: Secondary | ICD-10-CM | POA: Diagnosis not present

## 2019-07-08 DIAGNOSIS — Z01419 Encounter for gynecological examination (general) (routine) without abnormal findings: Secondary | ICD-10-CM | POA: Diagnosis not present

## 2019-07-11 ENCOUNTER — Encounter (INDEPENDENT_AMBULATORY_CARE_PROVIDER_SITE_OTHER): Payer: Self-pay

## 2019-07-11 ENCOUNTER — Ambulatory Visit (INDEPENDENT_AMBULATORY_CARE_PROVIDER_SITE_OTHER): Payer: Medicare Other | Admitting: Internal Medicine

## 2019-07-11 ENCOUNTER — Other Ambulatory Visit (INDEPENDENT_AMBULATORY_CARE_PROVIDER_SITE_OTHER): Payer: Self-pay | Admitting: Internal Medicine

## 2019-07-11 ENCOUNTER — Other Ambulatory Visit: Payer: Self-pay

## 2019-07-11 DIAGNOSIS — Z309 Encounter for contraceptive management, unspecified: Secondary | ICD-10-CM

## 2019-07-11 MED ORDER — MEDROXYPROGESTERONE ACETATE 150 MG/ML IM SUSP: 150.0000 mg | INTRAMUSCULAR | 3 refills | Status: DC

## 2019-07-11 MED ORDER — MEDROXYPROGESTERONE ACETATE 150 MG/ML IM SUSY: 150.0000 mg | PREFILLED_SYRINGE | INTRAMUSCULAR | Status: AC

## 2019-07-12 ENCOUNTER — Encounter (INDEPENDENT_AMBULATORY_CARE_PROVIDER_SITE_OTHER): Payer: Self-pay | Admitting: Internal Medicine

## 2019-07-13 ENCOUNTER — Ambulatory Visit (INDEPENDENT_AMBULATORY_CARE_PROVIDER_SITE_OTHER): Payer: Medicare Other

## 2019-07-13 ENCOUNTER — Other Ambulatory Visit: Payer: Self-pay

## 2019-07-13 VITALS — HR 88 | Temp 98.0°F | Resp 18 | Ht 63.0 in | Wt 209.0 lb

## 2019-07-13 DIAGNOSIS — Z9114 Patient's other noncompliance with medication regimen: Secondary | ICD-10-CM

## 2019-07-13 NOTE — Progress Notes (Signed)
Pt given  1 mL IM to the right deltoid. Pt tolerated well; no complaints.

## 2019-07-25 ENCOUNTER — Other Ambulatory Visit (INDEPENDENT_AMBULATORY_CARE_PROVIDER_SITE_OTHER): Payer: Medicare Other

## 2019-07-25 ENCOUNTER — Other Ambulatory Visit (INDEPENDENT_AMBULATORY_CARE_PROVIDER_SITE_OTHER): Payer: Self-pay | Admitting: Internal Medicine

## 2019-07-27 ENCOUNTER — Encounter (INDEPENDENT_AMBULATORY_CARE_PROVIDER_SITE_OTHER): Payer: Self-pay | Admitting: Internal Medicine

## 2019-07-28 ENCOUNTER — Other Ambulatory Visit (INDEPENDENT_AMBULATORY_CARE_PROVIDER_SITE_OTHER): Payer: Medicare Other

## 2019-07-28 ENCOUNTER — Other Ambulatory Visit (INDEPENDENT_AMBULATORY_CARE_PROVIDER_SITE_OTHER): Payer: Self-pay | Admitting: Internal Medicine

## 2019-07-28 DIAGNOSIS — E039 Hypothyroidism, unspecified: Secondary | ICD-10-CM

## 2019-07-28 DIAGNOSIS — I10 Essential (primary) hypertension: Secondary | ICD-10-CM | POA: Diagnosis not present

## 2019-07-28 DIAGNOSIS — E559 Vitamin D deficiency, unspecified: Secondary | ICD-10-CM | POA: Diagnosis not present

## 2019-07-29 LAB — COMPLETE METABOLIC PANEL WITH GFR
AG Ratio: 1.6 (calc) (ref 1.0–2.5)
ALT: 18 U/L (ref 6–29)
AST: 13 U/L (ref 10–35)
Albumin: 4.2 g/dL (ref 3.6–5.1)
Alkaline phosphatase (APISO): 136 U/L — ABNORMAL HIGH (ref 31–125)
BUN: 15 mg/dL (ref 7–25)
CO2: 21 mmol/L (ref 20–32)
Calcium: 9.3 mg/dL (ref 8.6–10.2)
Chloride: 110 mmol/L (ref 98–110)
Creat: 1.1 mg/dL (ref 0.50–1.10)
GFR, Est African American: 69 mL/min/{1.73_m2} (ref >=60)
GFR, Est Non African American: 59 mL/min/{1.73_m2} — ABNORMAL LOW (ref >=60)
Globulin: 2.7 g/dL (calc) (ref 1.9–3.7)
Glucose, Bld: 96 mg/dL (ref 65–99)
Potassium: 4.1 mmol/L (ref 3.5–5.3)
Sodium: 141 mmol/L (ref 135–146)
Total Bilirubin: 0.4 mg/dL (ref 0.2–1.2)
Total Protein: 6.9 g/dL (ref 6.1–8.1)

## 2019-07-29 LAB — T3, FREE: T3, Free: 3.3 pg/mL (ref 2.3–4.2)

## 2019-07-29 LAB — VITAMIN D 25 HYDROXY (VIT D DEFICIENCY, FRACTURES): Vit D, 25-Hydroxy: 65 ng/mL (ref 30–100)

## 2019-08-04 ENCOUNTER — Encounter (INDEPENDENT_AMBULATORY_CARE_PROVIDER_SITE_OTHER): Payer: Self-pay | Admitting: Internal Medicine

## 2019-08-08 ENCOUNTER — Telehealth (INDEPENDENT_AMBULATORY_CARE_PROVIDER_SITE_OTHER): Payer: Self-pay

## 2019-08-08 NOTE — Telephone Encounter (Signed)
-----   Message from Doree Albee, MD sent at 08/08/2019  8:23 AM EST ----- Please call the patient and let her know of what I have written on my chart.  Thanks.

## 2019-08-29 ENCOUNTER — Ambulatory Visit (INDEPENDENT_AMBULATORY_CARE_PROVIDER_SITE_OTHER): Payer: Medicare Other | Admitting: Internal Medicine

## 2019-08-29 ENCOUNTER — Other Ambulatory Visit: Payer: Self-pay

## 2019-08-29 ENCOUNTER — Encounter (INDEPENDENT_AMBULATORY_CARE_PROVIDER_SITE_OTHER): Payer: Self-pay | Admitting: Internal Medicine

## 2019-08-29 VITALS — BP 132/80 | HR 114 | Temp 99.1°F | Resp 18 | Ht 63.0 in | Wt 208.0 lb

## 2019-08-29 DIAGNOSIS — R059 Cough, unspecified: Secondary | ICD-10-CM

## 2019-08-29 DIAGNOSIS — L989 Disorder of the skin and subcutaneous tissue, unspecified: Secondary | ICD-10-CM | POA: Diagnosis not present

## 2019-08-29 DIAGNOSIS — R05 Cough: Secondary | ICD-10-CM

## 2019-08-29 DIAGNOSIS — E039 Hypothyroidism, unspecified: Secondary | ICD-10-CM

## 2019-08-29 MED ORDER — AZITHROMYCIN 250 MG PO TABS: ORAL_TABLET | ORAL | 0 refills | Status: DC

## 2019-08-29 NOTE — Progress Notes (Signed)
Metrics: Intervention Frequency ACO  Documented Smoking Status Yearly  Screened one or more times in 24 months  Cessation Counseling or  Active cessation medication Past 24 months  Past 24 months   Guideline developer: UpToDate (See UpToDate for funding source) Date Released: 2014       Wellness Office Visit  Subjective:  Patient ID: [name], female    DOB: 12-Oct-1970  Age: 49 y.o. MRN: [id number]  CC: This lady comes in for follow-up of hypothyroidism. HPI She is also concerned about 2 other problems.  She is coughing up green sputum for the last 1 month.  She denies any fever, body aches, myalgias, loss of taste, diarrhea.  She has not been in contact with anyone with COVID-19 disease. She also is worried about a skin lesion on the right forehead more in the right temple area.  She has had this for some time and she has a family history of skin cancer so she is worried about it. In terms of her hypothyroidism, she has tolerated NP thyroid 60 mg daily and her T3 is increasing.  This was based on blood work done about a month ago.  Past Medical History:  Diagnosis Date  . CKD (chronic kidney disease) stage 3, GFR 30-59 ml/min 03/15/2019  . Diarrhea 05/04/2017  . Essential hypertension, benign 03/15/2019  . Hypertension   . Hypothyroidism, adult 03/15/2019  . Kidney stones   . Obesity (BMI 30-39.9) 03/15/2019  . Renal disorder   . Vitamin D deficiency disease 03/15/2019      Family History  Problem Relation Age of Onset  . Cancer Mother   . Heart failure Mother   . Cancer Father   . Colon cancer Neg Hx     Social History   Social History Narrative   Divorced twice,1st lasted 5 years,2nd 4 years.On disabilty secondary to mental illness since 2004.Previously used to [employer].Lives alone,has 2 cats.   Social History   Tobacco Use  . Smoking status: Passive Smoke Exposure - Never Smoker  . Smokeless tobacco: Never Used  Substance Use Topics  .  Alcohol use: No    Current Meds  Medication Sig  . ALPRAZolam (XANAX) 1 MG tablet Take 1 mg by mouth 3 (three) times daily as needed for anxiety.   Marland Kitchen amLODipine (NORVASC) 5 MG tablet TAKE 1 TABLET BY MOUTH TWICE DAILY  . ARIPiprazole (ABILIFY) 5 MG tablet Take 5 mg by mouth daily.  . Cholecalciferol (VITAMIN D3) 125 MCG (5000 UT) TABS Take 5,000 Units by mouth daily.   . cloNIDine (CATAPRES) 0.3 MG tablet TAKE 1 TABLET BY MOUTH TWICE DAILY  . doxepin (SINEQUAN) 75 MG capsule Take 225 mg by mouth at bedtime.   . lamoTRIgine (LAMICTAL) 200 MG tablet Take 200 mg by mouth 2 (two) times daily.   . medroxyPROGESTERone (DEPO-PROVERA) 150 MG/ML injection Inject 1 mL (150 mg total) into the muscle every 3 (three) months.  . metoprolol succinate (TOPROL-XL) 25 MG 24 hr tablet TAKE 1 TABLET BY MOUTH EVERY DAY  . NP THYROID 60 MG tablet Take 1 tablet (60 mg total) by mouth daily before breakfast.  . potassium chloride (K-DUR) 10 MEQ tablet Take 20 mEq by mouth daily.   . QUEtiapine (SEROQUEL XR) 400 MG 24 hr tablet Take 400 mg by mouth at bedtime.    Current Facility-Administered Medications for the 08/29/19 encounter (Office Visit) with Doree Albee, MD  Medication  . medroxyPROGESTERone Acetate SUSY 150 mg  Objective:   Today's Vitals: BP 132/80 (BP Location: Right Arm, Patient Position: Sitting, Cuff Size: Normal)   Pulse (!) 114   Temp 99.1 F (37.3 C) (Temporal)   Resp 18   Ht 5\' 3"  (1.6 m)   Wt 208 lb (94.3 kg)   SpO2 99%   BMI 36.85 kg/m  Vitals with BMI 08/29/2019 07/13/2019 06/16/2019  Height 5\' 3"  5\' 3"  5\' 3"   Weight 208 lbs 209 lbs 209 lbs  BMI 36.85 A999333 A999333  Systolic [id number] - [id number]  Diastolic 80 - 80  Pulse [id number] 88 109     Physical Exam   She looks systemically well.  She has not lost any significant amount of weight.  The skin lesion on the right temporal area is to be an actinic keratosis.  However the patient is worried about this. Lung fields are clear and  there is no evidence of crackles, wheezing or bronchial breathing.    Assessment   1. Hypothyroidism, adult   2. Skin lesion   3. Cough       Tests ordered Orders Placed This Encounter  Procedures  . Ambulatory referral to Dermatology     Plan: 1. I have given her samples of NP thyroid 90 mg tablets, take 1 daily as I think we can increase the dose of thyroid now to see how she will tolerate this. 2. As far as the skin lesion is concerned, I will refer to dermatology for further evaluation even though I think this is benign. 3. The cough is likely to be possibly bronchitis.  I do not think this represents COVID-19 disease.  I discussed again with her COVID-19 vaccination which she is quite eligible for.  I am going to empirically treat her with Zithromax to see if this will help her. 4. Follow-up in 2 months.   Meds ordered this encounter  Medications  . azithromycin (ZITHROMAX) 250 MG tablet    Sig: Take 2 tablets the first day and then 1 tablet every day for the next 4 days    Dispense:  6 tablet    Refill:  0    Willey Due Luther Parody, MD

## 2019-08-30 NOTE — Patient Instructions (Signed)
Hypothyroidism  Hypothyroidism is when the thyroid gland does not make enough of certain hormones (it is underactive). The thyroid gland is a small gland located in the lower front part of the neck, just in front of the windpipe (trachea). This gland makes hormones that help control how the body uses food for energy (metabolism) as well as how the heart and brain function. These hormones also play a role in keeping your bones strong. When the thyroid is underactive, it produces too little of the hormones thyroxine (T4) and triiodothyronine (T3). What are the causes? This condition may be caused by:  Hashimoto's disease. This is a disease in which the body's disease-fighting system (immune system) attacks the thyroid gland. This is the most common cause.  Viral infections.  Pregnancy.  Certain medicines.  Birth defects.  Past radiation treatments to the head or neck for cancer.  Past treatment with radioactive iodine.  Past exposure to radiation in the environment.  Past surgical removal of part or all of the thyroid.  Problems with a gland in the center of the brain (pituitary gland).  Lack of enough iodine in the diet. What increases the risk? You are more likely to develop this condition if:  You are female.  You have a family history of thyroid conditions.  You use a medicine called lithium.  You take medicines that affect the immune system (immunosuppressants). What are the signs or symptoms? Symptoms of this condition include:  Feeling as though you have no energy (lethargy).  Not being able to tolerate cold.  Weight gain that is not explained by a change in diet or exercise habits.  Lack of appetite.  Dry skin.  Coarse hair.  Menstrual irregularity.  Slowing of thought processes.  Constipation.  Sadness or depression. How is this diagnosed? This condition may be diagnosed based on:  Your symptoms, your medical history, and a physical exam.  Blood  tests. You may also have imaging tests, such as an ultrasound or MRI. How is this treated? This condition is treated with medicine that replaces the thyroid hormones that your body does not make. After you begin treatment, it may take several weeks for symptoms to go away. Follow these instructions at home:  Take over-the-counter and prescription medicines only as told by your health care provider.  If you start taking any new medicines, tell your health care provider.  Keep all follow-up visits as told by your health care provider. This is important. ? As your condition improves, your dosage of thyroid hormone medicine may change. ? You will need to have blood tests regularly so that your health care provider can monitor your condition. Contact a health care provider if:  Your symptoms do not get better with treatment.  You are taking thyroid replacement medicine and you: ? Sweat a lot. ? Have tremors. ? Feel anxious. ? Lose weight rapidly. ? Cannot tolerate heat. ? Have emotional swings. ? Have diarrhea. ? Feel weak. Get help right away if you have:  Chest pain.  An irregular heartbeat.  A rapid heartbeat.  Difficulty breathing. Summary  Hypothyroidism is when the thyroid gland does not make enough of certain hormones (it is underactive).  When the thyroid is underactive, it produces too little of the hormones thyroxine (T4) and triiodothyronine (T3).  The most common cause is Hashimoto's disease, a disease in which the body's disease-fighting system (immune system) attacks the thyroid gland. The condition can also be caused by viral infections, medicine, pregnancy, or past   radiation treatment to the head or neck.  Symptoms may include weight gain, dry skin, constipation, feeling as though you do not have energy, and not being able to tolerate cold.  This condition is treated with medicine to replace the thyroid hormones that your body does not make. This information  is not intended to replace advice given to you by your health care provider. Make sure you discuss any questions you have with your health care provider. Document Revised: 05/01/2017 Document Reviewed: 04/29/2017 Elsevier Patient Education  2020 [employer]  

## 2019-09-15 DIAGNOSIS — Z1231 Encounter for screening mammogram for malignant neoplasm of breast: Secondary | ICD-10-CM | POA: Diagnosis not present

## 2019-09-16 ENCOUNTER — Telehealth (INDEPENDENT_AMBULATORY_CARE_PROVIDER_SITE_OTHER): Payer: Self-pay | Admitting: Nurse Practitioner

## 2019-09-16 ENCOUNTER — Encounter (INDEPENDENT_AMBULATORY_CARE_PROVIDER_SITE_OTHER): Payer: Self-pay | Admitting: Nurse Practitioner

## 2019-09-16 ENCOUNTER — Other Ambulatory Visit: Payer: Self-pay

## 2019-09-16 ENCOUNTER — Ambulatory Visit (INDEPENDENT_AMBULATORY_CARE_PROVIDER_SITE_OTHER): Payer: Medicare Other | Admitting: Nurse Practitioner

## 2019-09-16 VITALS — BP 162/90 | HR 105

## 2019-09-16 DIAGNOSIS — Z0000 Encounter for general adult medical examination without abnormal findings: Secondary | ICD-10-CM | POA: Diagnosis not present

## 2019-09-16 NOTE — Patient Instructions (Signed)
  [name] , Thank you for taking time to come for your Medicare Wellness Visit. I appreciate your ongoing commitment to your health goals. Please review the following plan we discussed and let me know if I can assist you in the future.   These are the goals we discussed: Goals    . Blood pressure     Normal blood pressure - <140/90       This is a list of the screening recommended for you and due dates:  Health Maintenance  Topic Date Due  . Tetanus Vaccine  Never done  . HIV Screening  09/15/2020*  . Flu Shot  01/01/2020  . Colon Cancer Screening  06/05/2022  . Pap Smear  07/03/2022  *Topic was postponed. The date shown is not the original due date.

## 2019-09-16 NOTE — Progress Notes (Signed)
Due to national recommendations of social distancing related to the [place] pandemic, an audio/visual tele-health visit was felt to be the most appropriate encounter type for this patient today. I connected with  [name] on 09/16/19 utilizing audio-only technology and verified that I am speaking with the correct person using two identifiers. The patient was located at their home, and I was located at home during the encounter. I discussed the limitations of evaluation and management by telemedicine. The patient expressed understanding and agreed to proceed.  We attempted to complete this level 5 area, however we had technological failure, and patient never did receive a text message to initiate video.  Thus audio only technology was used     Subjective:   [name] is a 49 y.o. female who presents for Medicare Annual (Subsequent) preventive examination.  Review of Systems:  Reviewed and negative Cardiac Risk Factors include: none     Objective:     Vitals: BP (!) 162/90   Pulse (!) 105   There is no height or weight on file to calculate BMI.  Advanced Directives 09/16/2019 06/05/2017 03/13/2016 10/18/2015 04/20/2015 09/07/2014  Does Patient Have a Medical Advance Directive? No No No No No No  Would patient like information on creating a medical advance directive? No - Patient declined No - Patient declined No - patient declined information - - No - patient declined information    Tobacco Social History   Tobacco Use  Smoking Status Passive Smoke Exposure - Never Smoker  Smokeless Tobacco Never Used     Counseling given: Not Answered   Clinical Intake:  Pre-visit preparation completed: Yes  Pain : No/denies pain     BMI - recorded: 36.85 Nutritional Status: BMI > 30  Obese Diabetes: No  How often do you need to have someone help you when you read instructions, pamphlets, or other written materials from your doctor or pharmacy?: 1 - Never What is the last grade  level you completed in school?: some college  Interpreter Needed?: No  Information entered by :: Jeralyn Ruths, Np-C  Past Medical History:  Diagnosis Date  . Anxiety   . CKD (chronic kidney disease) stage 3, GFR 30-59 ml/min 03/15/2019  . Depression   . Diarrhea 05/04/2017  . Essential hypertension, benign 03/15/2019  . Hypertension   . Hypothyroidism, adult 03/15/2019  . Kidney stones   . Obesity (BMI 30-39.9) 03/15/2019  . Renal disorder   . Vitamin D deficiency disease 03/15/2019   Past Surgical History:  Procedure Laterality Date  . BIOPSY  06/05/2017   Procedure: BIOPSY;  Surgeon: Rogene Houston, MD;  [facility]: [facility];  Service: Endoscopy;;  colon  . CHOLECYSTECTOMY    . COLONOSCOPY N/A 06/05/2017   Procedure: COLONOSCOPY;  Surgeon: Rogene Houston, MD;  Location: [facility];  Service: Endoscopy;  Laterality: N/A;  8:30  . KIDNEY STONE SURGERY    . kidney stones     Family History  Problem Relation Age of Onset  . Cancer Mother   . Heart failure Mother   . Cancer Father   . Colon cancer Neg Hx    Social History   Socioeconomic History  . Marital status: Divorced    Spouse name: Not on file  . Number of children: Not on file  . Years of education: Not on file  . Highest education level: Not on file  Occupational History  . Not on file  Tobacco Use  . Smoking status:  Passive Smoke Exposure - Never Smoker  . Smokeless tobacco: Never Used  Substance and Sexual Activity  . Alcohol use: No  . Drug use: No  . Sexual activity: Not on file  Other Topics Concern  . Not on file  Social History Narrative   Divorced twice,1st lasted 5 years,2nd 4 years.On disabilty secondary to mental illness since 2004.Previously used to [employer].Lives alone,has 2 cats.   Social Determinants of Health   Financial Resource Strain:   . Difficulty of Paying Living Expenses:   Food Insecurity:   . Worried About Charity fundraiser in the Last Year:   . Youth worker in the Last Year:   Transportation Needs:   . Film/video editor (Medical):   Marland Kitchen Lack of Transportation (Non-Medical):   Physical Activity:   . Days of Exercise per Week:   . Minutes of Exercise per Session:   Stress:   . Feeling of Stress :   Social Connections:   . Frequency of Communication with Friends and Family:   . Frequency of Social Gatherings with Friends and Family:   . Attends Religious Services:   . Active Member of Clubs or Organizations:   . Attends Archivist Meetings:   Marland Kitchen Marital Status:     Outpatient Encounter Medications as of 09/16/2019  Medication Sig  . ALPRAZolam (XANAX) 1 MG tablet Take 1 mg by mouth 3 (three) times daily as needed for anxiety.   Marland Kitchen amLODipine (NORVASC) 5 MG tablet TAKE 1 TABLET BY MOUTH TWICE DAILY  . ARIPiprazole (ABILIFY) 5 MG tablet Take 5 mg by mouth daily.  . Cholecalciferol (VITAMIN D3) 125 MCG (5000 UT) TABS Take 10,000 Units by mouth daily.   . cloNIDine (CATAPRES) 0.3 MG tablet TAKE 1 TABLET BY MOUTH TWICE DAILY  . doxepin (SINEQUAN) 75 MG capsule Take 225 mg by mouth at bedtime.   . lamoTRIgine (LAMICTAL) 200 MG tablet Take 200 mg by mouth 2 (two) times daily.   . medroxyPROGESTERone (DEPO-PROVERA) 150 MG/ML injection Inject 1 mL (150 mg total) into the muscle every 3 (three) months.  . metoprolol succinate (TOPROL-XL) 25 MG 24 hr tablet TAKE 1 TABLET BY MOUTH EVERY DAY  . NP THYROID 60 MG tablet Take 1 tablet (60 mg total) by mouth daily before breakfast.  . potassium chloride (K-DUR) 10 MEQ tablet Take 20 mEq by mouth daily.   . QUEtiapine (SEROQUEL XR) 400 MG 24 hr tablet Take 400 mg by mouth at bedtime.   . [DISCONTINUED] amLODipine (NORVASC) 5 MG tablet TAKE 1 TABLET BY MOUTH TWICE DAILY  . [DISCONTINUED] azithromycin (ZITHROMAX) 250 MG tablet Take 2 tablets the first day and then 1 tablet every day for the next 4 days  . [DISCONTINUED] cloNIDine (CATAPRES) 0.3 MG tablet Take 0.3 mg by mouth 2 (two) times  daily.  . [DISCONTINUED] metoprolol succinate (TOPROL-XL) 25 MG 24 hr tablet TAKE 1 TABLET BY MOUTH EVERY DAY  . [DISCONTINUED] potassium chloride (K-DUR) 10 MEQ tablet TAKE TWO TABLETS BY MOUTH EVERY DAY   Facility-Administered Encounter Medications as of 09/16/2019  Medication  . medroxyPROGESTERone Acetate SUSY 150 mg    Activities of Daily Living In your present state of health, do you have any difficulty performing the following activities: 09/16/2019  Hearing? N  Vision? N  Difficulty concentrating or making decisions? Y  Walking or climbing stairs? Y  Dressing or bathing? N  Doing errands, shopping? N  Preparing Food and eating ? N  Using the Toilet? N  In the past six months, have you accidently leaked urine? N  Do you have problems with loss of bowel control? Y  Managing your Medications? N  Managing your Finances? N  Housekeeping or managing your Housekeeping? N  Some recent data might be hidden    Patient Care Team: Doree Albee, MD as PCP - General (Internal Medicine)    Assessment:   This is a routine wellness examination for [name].  Exercise Activities and Dietary recommendations Current Exercise Habits: The patient does not participate in regular exercise at present, Exercise limited by: psychological condition(s)  Goals    . Blood pressure     Normal blood pressure - <140/90       Fall Risk Fall Risk  09/16/2019  Falls in the past year? 0  Number falls in past yr: 0  Injury with Fall? 0  Follow up Education provided;Falls prevention discussed;Falls evaluation completed   Is the patient's home free of loose throw rugs in walkways, pet beds, electrical cords, etc?   yes      Grab bars in the bathroom? yes      Handrails on the stairs?   yes      Adequate lighting?   yes  Timed Get Up and Go performed: not performed  Depression Screen PHQ 2/9 Scores 09/16/2019  PHQ - 2 Score 0     Cognitive Function     6CIT Screen 09/16/2019  What Year?  0 points  What month? 0 points  What time? 0 points  Count back from 20 0 points  Months in reverse 0 points  Repeat phrase 2 points  Total Score 2    Immunization History  Administered Date(s) Administered  . Influenza,inj,Quad PF,6+ Mos 03/15/2019    Qualifies for Shingles Vaccine? No  Screening Tests Health Maintenance  Topic Date Due  . HIV Screening  Never done  . TETANUS/TDAP  Never done  . PAP SMEAR-Modifier  Never done  . INFLUENZA VACCINE  01/01/2020  . COLONOSCOPY  06/05/2022    Cancer Screenings: Lung: Low Dose CT Chest recommended if Age 16-80 years, 30 pack-year currently smoking OR have quit w/in 15years. Patient does not qualify. Breast:  Up to date on Mammogram? Yes -she tells me she went to the breast center yesterday to have this completed, she has not heard about her results yet Up to date of Bone Density/Dexa? No -not applicable Colorectal: Not applicable      Plan:   Her blood pressure was mildly elevated on her at home blood pressure cuff.  She is asymptomatic right now.  We did discuss that she will be evaluated in the office sooner than her current follow-up appointment.  I did tell her about red flag symptoms including chest pain difficulty breathing shortness of breath severe headache, etc. and if these were to occur she can call our office if you are open but if we are closed she should consider proceeding to the emergency department.  Tell me she understands.  She is up-to-date on flu shot, I do not see that she would qualify for pneumonia vaccine at her age.  She thinks has been at least 10 years and she is got the tetanus shot.  I did tell her she probably should consider getting a booster, however she is planning on getting her Covid vaccine series started next week.  I did tell her that she should not have any other vaccines if possible at least until  2 weeks after her second Covid shot has been administered.  She tells me she understands.  I  encouraged her to call us or see to the pharmacy to have her tetanus booster after her Covid vaccine series has completed, or to let us know if she experiences any kind of penetrating wound.  She tells me she understands.  She tells me she had her last Pap smear back in February of this year so she should be up-to-date with this, we are awaiting results of her mammogram, we did discuss folic acid supplement in a female of childbearing age and its role in preventing neural tube defects.  She does not sound like she would be interested in taking the folic acid supplement, as she is not trying for conception at this time.  We also discussed sexual transmitted infection screening, and she has elected to not undergo this at this time.  She is not a tobacco smoker.  Depression screening completed today   I have personally reviewed and noted the following in the patient's chart:   . Medical and social history . Use of alcohol, tobacco or illicit drugs  . Current medications and supplements . Functional ability and status . Nutritional status . Physical activity . Advanced directives . List of other physicians . Hospitalizations, surgeries, and ER visits in previous 12 months . Vitals . Screenings to include cognitive, depression, and falls . Referrals and appointments  In addition, I have reviewed and discussed with patient certain preventive protocols, quality metrics, and best practice recommendations. A written personalized care plan for preventive services as well as general preventive health recommendations were provided to patient.    She will follow-up in approximately 3 weeks regarding her elevated blood pressure. Ailene Ards, NP  09/16/2019

## 2019-09-16 NOTE — Telephone Encounter (Signed)
[name], please print out this patient's after visit summary from appointment on 09/16/2019 and mailed this to her address.  Thank you.

## 2019-09-19 NOTE — Telephone Encounter (Signed)
Noted. Done.

## 2019-09-21 DIAGNOSIS — Z23 Encounter for immunization: Secondary | ICD-10-CM | POA: Diagnosis not present

## 2019-10-04 ENCOUNTER — Other Ambulatory Visit: Payer: Self-pay

## 2019-10-04 ENCOUNTER — Ambulatory Visit (INDEPENDENT_AMBULATORY_CARE_PROVIDER_SITE_OTHER): Payer: Medicare Other | Admitting: Nurse Practitioner

## 2019-10-04 VITALS — BP 162/90 | HR 135 | Temp 101.3°F | Ht 63.0 in | Wt 201.6 lb

## 2019-10-04 DIAGNOSIS — R5383 Other fatigue: Secondary | ICD-10-CM | POA: Diagnosis not present

## 2019-10-04 DIAGNOSIS — L989 Disorder of the skin and subcutaneous tissue, unspecified: Secondary | ICD-10-CM

## 2019-10-04 DIAGNOSIS — I10 Essential (primary) hypertension: Secondary | ICD-10-CM

## 2019-10-04 MED ORDER — METOPROLOL SUCCINATE ER 50 MG PO TB24: 50.0000 mg | ORAL_TABLET | Freq: Every day | ORAL | 1 refills | Status: DC

## 2019-10-04 NOTE — Progress Notes (Addendum)
Subjective:  Patient ID: [name], female    DOB: Jul 27, 1970  Age: 49 y.o. MRN: [id number]  CC:  Chief Complaint  Patient presents with  . Hypertension  . Other    Fatigue      HPI  This patient came in today for an appointment for the above.  Upon rooming the patient it was noted that she was running a fever of 101.3 F.  She had also disclosed to the medical assistant that she was feeling very fatigued, had nasal congestion, and experienced some diarrhea this morning.  To me she tells me that the diarrhea is not out of the normal, but she was running a fever at home of 100.4 F earlier this week.  She has gotten the first COVID-19 vaccine administered.  At this time our office is not equipped with the proper PPE that is recommended per CDC, and we are unable to perform COVID-19 testing in this office.  After vital signs were collected she was asked to return to her car and office visit was conducted remotely from that point on.  Upon further questioning, she tells me that her symptoms of fatigue have been occurring for approximately 2 weeks.  As stated above she does also admit to having some nasal congestion.  She tells me she did take some antibiotics a couple weeks ago because she thought she may have had a sinus infection.  She does report a mild dry cough that she thinks is related to postnasal drip.  She denies any shortness of breath.  She denies any loss of taste or smell.  As for her hypertension, she continues on amlodipine 5 mg twice a day, clonidine (Per our chart states 0.3 mg twice a day, but she feels she is taking 0.5 mg twice a day), metoprolol 25 mg daily.  She tells me she checks her blood pressure at home on a regular basis and it is usually running in the 0000000 systolically, and 0000000 diastolically.   Past Medical History:  Diagnosis Date  . Anxiety   . CKD (chronic kidney disease) stage 3, GFR 30-59 ml/min 03/15/2019  . Depression   . Diarrhea 05/04/2017    . Essential hypertension, benign 03/15/2019  . Hypertension   . Hypothyroidism, adult 03/15/2019  . Kidney stones   . Obesity (BMI 30-39.9) 03/15/2019  . Renal disorder   . Vitamin D deficiency disease 03/15/2019      Family History  Problem Relation Age of Onset  . Cancer Mother   . Heart failure Mother   . Cancer Father   . Colon cancer Neg Hx     Social History   Social History Narrative   Divorced twice,1st lasted 5 years,2nd 4 years.On disabilty secondary to mental illness since 2004.Previously used to [employer].Lives alone,has 2 cats.   Social History   Tobacco Use  . Smoking status: Passive Smoke Exposure - Never Smoker  . Smokeless tobacco: Never Used  Substance Use Topics  . Alcohol use: No     No outpatient medications have been marked as taking for the 10/04/19 encounter (Office Visit) with Ailene Ards, NP.   Current Facility-Administered Medications for the 10/04/19 encounter (Office Visit) with Ailene Ards, NP  Medication  . medroxyPROGESTERone Acetate SUSY 150 mg    ROS:  Negative unless otherwise stated above.   Objective:   Today's Vitals: BP (!) 162/90   Pulse (!) 135   Temp (!) 101.3 F (38.5 C)  Ht 5\' 3"  (1.6 m)   Wt 201 lb 9.6 oz (91.4 kg)   BMI 35.71 kg/m  Vitals with BMI 10/04/2019 09/16/2019 08/29/2019  Height 5\' 3"  - 5\' 3"   Weight 201 lbs 10 oz - 208 lbs  BMI [id number] - [id number]  Systolic 0000000 0000000 [id number]  Diastolic 90 90 80  Pulse [id number] 105 114     Physical Exam Comprehensive physical exam not conducted today as office visit was mainly conducted remotely.  On the phone she sounded well, she answered questions appropriately, she was alert and oriented.  In person, I did not note any acute respiratory distress, she was dressed appropriately, and she was very agreeable to returning to her car to complete the appointment.      Assessment and Plan   1. Essential hypertension, benign   2. Fatigue, unspecified type   3. Skin  lesion      Plan: 1.  I am going to increase her metoprolol from 25 mg daily to 50 mg daily.  She will follow-up in this office in approximately 1 month for blood pressure check.  She was encouraged to call the office if she has any concerns.  As for the discrepancy regarding her clonidine dose, I asked for her to verify that she is taking 0.5 mg twice a day and let us know.  2.  I have suggested that she be tested for COVID-19.  I gave her the phone number to the [facility] health site at which she can get scheduled for testing.  While she is waiting for her results I recommended that she isolate at home.  I told her that if her test results come back showing that she is positive for COVID-19 that she needs to isolate at home for at least 10 days since symptom onset AND until she is afebrile for at least 3 days.  I recommended that she use Tylenol as needed to control her fever, however this may mask fever so she needs to be aware of that as well.  I recommended that she avoid any nasal decongestants due to her hypertension.  She tells me she understands.   3. Of note, she also asked me to update her most recent referral to dermatology to be seen by a dermatologist in the area.  I will make these changes to her referral.  Tests ordered No orders of the defined types were placed in this encounter.     Meds ordered this encounter  Medications  . metoprolol succinate (TOPROL-XL) 50 MG 24 hr tablet    Sig: Take 1 tablet (50 mg total) by mouth daily.    Dispense:  30 tablet    Refill:  1    Order Specific Question:   Supervising Provider    Answer:   Doree Albee [id number]    Patient to follow-up in 1 month. I spent approximately 12 minutes dedicated to the care of this patient on the date of this encounter which includes a combination of either face-to-face or virtual contact with the patient, reviewing records, and making recommendations/changes to treatment regimen.  Ailene Ards, NP

## 2019-10-04 NOTE — Addendum Note (Signed)
Addended by: Jeralyn Ruths E on: 10/04/2019 10:00 AM   Modules accepted: Level of Service

## 2019-10-11 ENCOUNTER — Other Ambulatory Visit (INDEPENDENT_AMBULATORY_CARE_PROVIDER_SITE_OTHER): Payer: Self-pay | Admitting: Internal Medicine

## 2019-10-18 DIAGNOSIS — Z23 Encounter for immunization: Secondary | ICD-10-CM | POA: Diagnosis not present

## 2019-11-03 ENCOUNTER — Ambulatory Visit (INDEPENDENT_AMBULATORY_CARE_PROVIDER_SITE_OTHER): Payer: Medicare Other | Admitting: Internal Medicine

## 2019-11-09 ENCOUNTER — Encounter (INDEPENDENT_AMBULATORY_CARE_PROVIDER_SITE_OTHER): Payer: Self-pay | Admitting: Nurse Practitioner

## 2019-11-09 ENCOUNTER — Other Ambulatory Visit: Payer: Self-pay

## 2019-11-09 ENCOUNTER — Ambulatory Visit (INDEPENDENT_AMBULATORY_CARE_PROVIDER_SITE_OTHER): Payer: Medicare Other | Admitting: Nurse Practitioner

## 2019-11-09 VITALS — BP 105/80 | HR 105 | Temp 97.5°F | Ht 63.0 in | Wt 206.2 lb

## 2019-11-09 DIAGNOSIS — E039 Hypothyroidism, unspecified: Secondary | ICD-10-CM | POA: Diagnosis not present

## 2019-11-09 DIAGNOSIS — E559 Vitamin D deficiency, unspecified: Secondary | ICD-10-CM

## 2019-11-09 DIAGNOSIS — I10 Essential (primary) hypertension: Secondary | ICD-10-CM

## 2019-11-09 DIAGNOSIS — N3090 Cystitis, unspecified without hematuria: Secondary | ICD-10-CM | POA: Diagnosis not present

## 2019-11-09 DIAGNOSIS — Z309 Encounter for contraceptive management, unspecified: Secondary | ICD-10-CM | POA: Diagnosis not present

## 2019-11-09 DIAGNOSIS — Z789 Other specified health status: Secondary | ICD-10-CM | POA: Diagnosis not present

## 2019-11-09 MED ORDER — NITROFURANTOIN MONOHYD MACRO 100 MG PO CAPS: 100.0000 mg | ORAL_CAPSULE | Freq: Two times a day (BID) | ORAL | 0 refills | Status: DC

## 2019-11-09 MED ORDER — MEDROXYPROGESTERONE ACETATE 150 MG/ML IM SUSP
150.0000 mg | Freq: Once | INTRAMUSCULAR | Status: AC
  Administered 2020-02-09: 150 mg via INTRAMUSCULAR

## 2019-11-09 MED ORDER — METOPROLOL SUCCINATE ER 25 MG PO TB24: 25.0000 mg | ORAL_TABLET | Freq: Every day | ORAL | 0 refills | Status: DC

## 2019-11-09 MED ORDER — MEDROXYPROGESTERONE ACETATE 150 MG/ML IM SUSP: 150.0000 mg | Freq: Once | INTRAMUSCULAR | Status: DC

## 2019-11-09 NOTE — Progress Notes (Signed)
Subjective:  Patient ID: [name], female    DOB: 1970-06-06  Age: 49 y.o. MRN: [id number]  CC:  Chief Complaint  Patient presents with  . Hypertension  . Hypothyroidism  . Urinary Tract Infection  . Depo Shot      HPI  This patient comes in today for the above.  Hypertension: She continues on amlodipine, metoprolol, and clonidine.  Back in May her metoprolol was increased to 50 mg daily from 25 mg.  She tells me that when she got her medication refilled her metoprolol was still a 25 mg tablets.  So she is currently just taking the 25 mg daily.  Hypothyroidism: She continues on NP thyroid for her hypothyroidism.  She is on 60 mg daily.  Urinary frequency: Tells me about 3 days ago she started to experience some urinary frequency.  She denies any dysuria, hematuria, abdominal pain, nausea, vomiting, fever.  She tells me she feels that she has a urinary tract infection, but is not able to provide a urine sample today.  She tells me that her symptoms are consistent with previous urinary tract infections in the past.  Contraception: She is due for her Depo-Provera shot today, and is asking to have this administered.    Vitamin D deficiency: She continues on 10,000 IUs of vitamin D3 daily.  Last serum level was 65 and this was collected in February.   Past Medical History:  Diagnosis Date  . Anxiety   . CKD (chronic kidney disease) stage 3, GFR 30-59 ml/min 03/15/2019  . Depression   . Diarrhea 05/04/2017  . Essential hypertension, benign 03/15/2019  . Hypertension   . Hypothyroidism, adult 03/15/2019  . Kidney stones   . Obesity (BMI 30-39.9) 03/15/2019  . Renal disorder   . Vitamin D deficiency disease 03/15/2019      Family History  Problem Relation Age of Onset  . Cancer Mother   . Heart failure Mother   . Cancer Father   . Colon cancer Neg Hx     Social History   Social History Narrative   Divorced twice,1st lasted 5 years,2nd 4 years.On disabilty  secondary to mental illness since 2004.Previously used to [employer].Lives alone,has 2 cats.   Social History   Tobacco Use  . Smoking status: Passive Smoke Exposure - Never Smoker  . Smokeless tobacco: Never Used  Substance Use Topics  . Alcohol use: No     Current Meds  Medication Sig  . ALPRAZolam (XANAX) 1 MG tablet Take 1 mg by mouth 3 (three) times daily as needed for anxiety.   Marland Kitchen amLODipine (NORVASC) 5 MG tablet TAKE 1 TABLET BY MOUTH TWICE DAILY  . ARIPiprazole (ABILIFY) 5 MG tablet Take 5 mg by mouth daily.  . Cholecalciferol (VITAMIN D3) 125 MCG (5000 UT) TABS Take 10,000 Units by mouth daily.   . cloNIDine (CATAPRES) 0.3 MG tablet TAKE 1 TABLET BY MOUTH TWICE DAILY  . doxepin (SINEQUAN) 75 MG capsule Take 225 mg by mouth at bedtime.   . lamoTRIgine (LAMICTAL) 200 MG tablet Take 200 mg by mouth 2 (two) times daily.   . medroxyPROGESTERone (DEPO-PROVERA) 150 MG/ML injection Inject 1 mL (150 mg total) into the muscle every 3 (three) months.  . NP THYROID 60 MG tablet Take 1 tablet (60 mg total) by mouth daily before breakfast.  . potassium chloride (K-DUR) 10 MEQ tablet Take 20 mEq by mouth daily.   . QUEtiapine (SEROQUEL XR) 400 MG 24 hr tablet  Take 400 mg by mouth at bedtime.     ROS:  Review of Systems  Constitutional: Negative.   Respiratory: Negative.   Cardiovascular: Negative.   Gastrointestinal: Negative.   Genitourinary: Positive for frequency. Negative for dysuria, flank pain and hematuria.  Neurological: Negative for headaches.     Objective:   Today's Vitals: BP 105/80 (BP Location: Left Arm, Patient Position: Sitting, Cuff Size: Normal)   Pulse (!) 105   Temp (!) 97.5 F (36.4 C) (Temporal)   Ht 5' 3"  (1.6 m)   Wt 206 lb 3.2 oz (93.5 kg)   SpO2 97%   BMI 36.53 kg/m  Vitals with BMI 11/09/2019 10/04/2019 09/16/2019  Height 5' 3"  5' 3"  -  Weight 206 lbs 3 oz 201 lbs 10 oz -  BMI 89.37 34.28 -  Systolic 768 115 726  Diastolic 80 90 90    Pulse 105 135 105     Physical Exam Vitals reviewed.  Constitutional:      General: She is not in acute distress.    Appearance: Normal appearance.  HENT:     Head: Normocephalic and atraumatic.  Neck:     Vascular: No carotid bruit.  Cardiovascular:     Rate and Rhythm: Normal rate and regular rhythm.     Pulses: Normal pulses.     Heart sounds: Normal heart sounds.  Pulmonary:     Effort: Pulmonary effort is normal.     Breath sounds: Normal breath sounds.  Skin:    General: Skin is warm and dry.  Neurological:     General: No focal deficit present.     Mental Status: She is alert and oriented to person, place, and time.  Psychiatric:        Mood and Affect: Mood normal.        Behavior: Behavior normal.        Judgment: Judgment normal.          Assessment and Plan   1. Essential hypertension, benign   2. Hypothyroidism, adult   3. Vitamin D deficiency disease   4. Cystitis   5. Encounter for contraceptive management, unspecified type      Plan: 1.  Blood pressure is well controlled today.  She will continue on her current regimen as prescribed.  2.  We will check thyroid panel for further evaluation, in the meantime she will continue on her current dose of NP thyroid.  3.  We will collect serum vitamin D for further evaluation today.  4.  I will treat her empirically with antibiotics.  I did request that she bring a urine sample back to the office.  She tells me she will try to do this.  If she is able to I will then send off her urine for UA and culture.  5.  We will have medical assistant administer dose of her Depo-Provera today.   Tests ordered Orders Placed This Encounter  Procedures  . CMP with eGFR([facility])  . Vitamin D, 25-hydroxy  . TSH  . T3, Free  . T4, Free      Meds ordered this encounter  Medications  . metoprolol succinate (TOPROL-XL) 25 MG 24 hr tablet    Sig: Take 1 tablet (25 mg total) by mouth daily.    Dispense:  90  tablet    Refill:  0    This prescription was filled on 09/21/2019. Any refills authorized will be placed on file.    Order Specific Question:   Supervising  Provider    Answer:   Doree Albee [[id number]]  . nitrofurantoin, macrocrystal-monohydrate, (MACROBID) 100 MG capsule    Sig: Take 1 capsule (100 mg total) by mouth 2 (two) times daily.    Dispense:  10 capsule    Refill:  0    Order Specific Question:   Supervising Provider    Answer:   Hurshel Party C [[id number]]  . DISCONTD: medroxyPROGESTERone (DEPO-PROVERA) injection 150 mg    Patient to follow-up in 3 months, or sooner as needed  Ailene Ards, NP

## 2019-11-09 NOTE — Addendum Note (Signed)
Addended by: Saintclair Halsted A on: 11/09/2019 09:09 AM   Modules accepted: Orders

## 2019-11-10 LAB — T3, FREE: T3, Free: 3.4 pg/mL (ref 2.3–4.2)

## 2019-11-10 LAB — COMPLETE METABOLIC PANEL WITH GFR
AG Ratio: 1.4 (calc) (ref 1.0–2.5)
ALT: 19 U/L (ref 6–29)
AST: 13 U/L (ref 10–35)
Albumin: 4.1 g/dL (ref 3.6–5.1)
Alkaline phosphatase (APISO): 140 U/L — ABNORMAL HIGH (ref 31–125)
BUN/Creatinine Ratio: 11 (calc) (ref 6–22)
BUN: 14 mg/dL (ref 7–25)
CO2: 25 mmol/L (ref 20–32)
Calcium: 9.4 mg/dL (ref 8.6–10.2)
Chloride: 105 mmol/L (ref 98–110)
Creat: 1.24 mg/dL — ABNORMAL HIGH (ref 0.50–1.10)
GFR, Est African American: 59 mL/min/{1.73_m2} — ABNORMAL LOW (ref >=60)
GFR, Est Non African American: 51 mL/min/{1.73_m2} — ABNORMAL LOW (ref >=60)
Globulin: 3 g/dL (calc) (ref 1.9–3.7)
Glucose, Bld: 105 mg/dL — ABNORMAL HIGH (ref 65–99)
Potassium: 4.5 mmol/L (ref 3.5–5.3)
Sodium: 137 mmol/L (ref 135–146)
Total Bilirubin: 0.3 mg/dL (ref 0.2–1.2)
Total Protein: 7.1 g/dL (ref 6.1–8.1)

## 2019-11-10 LAB — VITAMIN D 25 HYDROXY (VIT D DEFICIENCY, FRACTURES): Vit D, 25-Hydroxy: 56 ng/mL (ref 30–100)

## 2019-11-10 LAB — TSH: TSH: 1.72 mIU/L

## 2019-11-10 LAB — T4, FREE: Free T4: 0.9 ng/dL (ref 0.8–1.8)

## 2019-11-15 ENCOUNTER — Telehealth (INDEPENDENT_AMBULATORY_CARE_PROVIDER_SITE_OTHER): Payer: Self-pay

## 2019-11-24 NOTE — Telephone Encounter (Signed)
Pt wants to go to [place] office.

## 2019-12-11 ENCOUNTER — Other Ambulatory Visit (INDEPENDENT_AMBULATORY_CARE_PROVIDER_SITE_OTHER): Payer: Self-pay | Admitting: Nurse Practitioner

## 2019-12-11 DIAGNOSIS — I10 Essential (primary) hypertension: Secondary | ICD-10-CM

## 2019-12-19 DIAGNOSIS — L650 Telogen effluvium: Secondary | ICD-10-CM | POA: Diagnosis not present

## 2019-12-19 DIAGNOSIS — L820 Inflamed seborrheic keratosis: Secondary | ICD-10-CM | POA: Diagnosis not present

## 2020-01-04 DIAGNOSIS — F3131 Bipolar disorder, current episode depressed, mild: Secondary | ICD-10-CM | POA: Diagnosis not present

## 2020-01-08 ENCOUNTER — Other Ambulatory Visit (INDEPENDENT_AMBULATORY_CARE_PROVIDER_SITE_OTHER): Payer: Self-pay | Admitting: Internal Medicine

## 2020-02-08 ENCOUNTER — Other Ambulatory Visit (INDEPENDENT_AMBULATORY_CARE_PROVIDER_SITE_OTHER): Payer: Self-pay | Admitting: Internal Medicine

## 2020-02-08 ENCOUNTER — Other Ambulatory Visit (INDEPENDENT_AMBULATORY_CARE_PROVIDER_SITE_OTHER): Payer: Self-pay | Admitting: Nurse Practitioner

## 2020-02-09 ENCOUNTER — Ambulatory Visit (INDEPENDENT_AMBULATORY_CARE_PROVIDER_SITE_OTHER): Payer: Medicare Other

## 2020-02-09 ENCOUNTER — Encounter (INDEPENDENT_AMBULATORY_CARE_PROVIDER_SITE_OTHER): Payer: Self-pay

## 2020-02-09 ENCOUNTER — Other Ambulatory Visit: Payer: Self-pay

## 2020-02-09 VITALS — Temp 97.0°F | Resp 18 | Ht 62.0 in | Wt 209.0 lb

## 2020-02-09 DIAGNOSIS — Z789 Other specified health status: Secondary | ICD-10-CM

## 2020-02-09 NOTE — Patient Instructions (Signed)
Set for 3 mth for nurse visit for Depro.

## 2020-02-09 NOTE — Progress Notes (Signed)
Pt given 1 mL IM to the right deltoid. Pt tolerated well;no complaints. Applied band-aid. Next dose will be in 3 months.

## 2020-02-20 ENCOUNTER — Ambulatory Visit (INDEPENDENT_AMBULATORY_CARE_PROVIDER_SITE_OTHER): Payer: Medicare Other | Admitting: Internal Medicine

## 2020-03-11 ENCOUNTER — Other Ambulatory Visit (INDEPENDENT_AMBULATORY_CARE_PROVIDER_SITE_OTHER): Payer: Self-pay | Admitting: Internal Medicine

## 2020-03-21 DIAGNOSIS — Z23 Encounter for immunization: Secondary | ICD-10-CM | POA: Diagnosis not present

## 2020-04-03 ENCOUNTER — Encounter (INDEPENDENT_AMBULATORY_CARE_PROVIDER_SITE_OTHER): Payer: Self-pay | Admitting: Internal Medicine

## 2020-04-03 ENCOUNTER — Other Ambulatory Visit: Payer: Self-pay

## 2020-04-03 ENCOUNTER — Ambulatory Visit (INDEPENDENT_AMBULATORY_CARE_PROVIDER_SITE_OTHER): Payer: Medicare Other | Admitting: Internal Medicine

## 2020-04-03 VITALS — BP 142/90 | HR 111 | Temp 98.2°F | Ht 63.0 in | Wt 212.2 lb

## 2020-04-03 DIAGNOSIS — I10 Essential (primary) hypertension: Secondary | ICD-10-CM

## 2020-04-03 DIAGNOSIS — Z23 Encounter for immunization: Secondary | ICD-10-CM | POA: Diagnosis not present

## 2020-04-03 DIAGNOSIS — E039 Hypothyroidism, unspecified: Secondary | ICD-10-CM | POA: Diagnosis not present

## 2020-04-03 DIAGNOSIS — R739 Hyperglycemia, unspecified: Secondary | ICD-10-CM | POA: Diagnosis not present

## 2020-04-03 NOTE — Addendum Note (Signed)
Addended by: Anibal Henderson on: 04/03/2020 08:51 AM   Modules accepted: Orders

## 2020-04-03 NOTE — Progress Notes (Signed)
Metrics: Intervention Frequency ACO  Documented Smoking Status Yearly  Screened one or more times in 24 months  Cessation Counseling or  Active cessation medication Past 24 months  Past 24 months   Guideline developer: UpToDate (See UpToDate for funding source) Date Released: 2014       Wellness Office Visit  Subjective:  Patient ID: [name], female    DOB: [birth year]  Age: 49 y.o. MRN: [id number]  CC: This lady comes in for follow-up of hypertension, hypothyroidism, hyperglycemia and obesity. HPI She comes in today wearing a wig because she says her hair is falling out.  She believes that when she was taking NP thyroid and was able to afford it, her hair loss was not as bad. She continues on amlodipine and clonidine as well as metoprolol for hypertension.  She denies any chest pain, dyspnea, palpitations or limb weakness. She has an element of chronic kidney disease which has been stable.  Past Medical History:  Diagnosis Date  . Anxiety   . CKD (chronic kidney disease) stage 3, GFR 30-59 ml/min ([place]) 03/15/2019  . Depression   . Diarrhea 05/04/2017  . Essential hypertension, benign 03/15/2019  . Hypertension   . Hypothyroidism, adult 03/15/2019  . Kidney stones   . Obesity (BMI 30-39.9) 03/15/2019  . Renal disorder   . Vitamin D deficiency disease 03/15/2019   Past Surgical History:  Procedure Laterality Date  . BIOPSY  06/05/2017   Procedure: BIOPSY;  Surgeon: Rogene Houston, MD;  [facility];  Service: Endoscopy;;  colon  . CHOLECYSTECTOMY    . COLONOSCOPY N/A 06/05/2017   Procedure: COLONOSCOPY;  Surgeon: Rogene Houston, MD;  [facility];  Service: Endoscopy;  Laterality: N/A;  8:30  . KIDNEY STONE SURGERY    . kidney stones       Family History  Problem Relation Age of Onset  . Cancer Mother   . Heart failure Mother   . Cancer Father   . Colon cancer Neg Hx     Social History   Social History Narrative   Divorced twice,1st  lasted 5 years,2nd 4 years.On disabilty secondary to mental illness since 2004.Previously used to [employer].Lives alone,has 2 cats.   Social History   Tobacco Use  . Smoking status: Passive Smoke Exposure - Never Smoker  . Smokeless tobacco: Never Used  Substance Use Topics  . Alcohol use: No    Current Meds  Medication Sig  . ALPRAZolam (XANAX) 1 MG tablet Take 1 mg by mouth 3 (three) times daily as needed for anxiety.   Marland Kitchen amLODipine (NORVASC) 5 MG tablet TAKE 1 TABLET BY MOUTH TWICE DAILY  . ARIPiprazole (ABILIFY) 5 MG tablet Take 5 mg by mouth daily.  . Cholecalciferol (VITAMIN D3) 125 MCG (5000 UT) TABS Take 10,000 Units by mouth daily.   . cloNIDine (CATAPRES) 0.3 MG tablet TAKE 1 TABLET BY MOUTH TWICE DAILY  . doxepin (SINEQUAN) 75 MG capsule Take 225 mg by mouth at bedtime.   . lamoTRIgine (LAMICTAL) 200 MG tablet Take 200 mg by mouth 2 (two) times daily.   . medroxyPROGESTERone (DEPO-PROVERA) 150 MG/ML injection Inject 1 mL (150 mg total) into the muscle every 3 (three) months.  . metoprolol succinate (TOPROL-XL) 25 MG 24 hr tablet Take 1 tablet (25 mg total) by mouth daily.  . potassium chloride (KLOR-CON) 10 MEQ tablet TAKE TWO TABLETS BY MOUTH EVERY DAY  . QUEtiapine (SEROQUEL XR) 400 MG 24 hr tablet Take  400 mg by mouth at bedtime.       Depression screen [facility] 2/9 09/16/2019  Decreased Interest 0  Down, Depressed, Hopeless 0  PHQ - 2 Score 0     Objective:   Today's Vitals: BP (!) 142/90   Pulse (!) 111   Temp 98.2 F (36.8 C) (Temporal)   Ht 5\' 3"  (1.6 m)   Wt 212 lb 3.2 oz (96.3 kg)   SpO2 98%   BMI 37.59 kg/m  Vitals with BMI 04/03/2020 02/09/2020 11/09/2019  Height 5\' 3"  5\' 2"  5\' 3"   Weight 212 lbs 3 oz 209 lbs 206 lbs 3 oz  BMI 37.6 73.66 81.59  Systolic 470 - 761  Diastolic 90 - 80  Pulse 518 - 105     Physical Exam   She looks systemically well, remains obese.  Blood pressure is slightly elevated today for her.    Assessment    1. Hypothyroidism, adult   2. Essential hypertension, benign   3. Hyperglycemia       Tests ordered Orders Placed This Encounter  Procedures  . COMPLETE METABOLIC PANEL WITH GFR  . Hemoglobin A1c     Plan: 1. She will continue with current antihypertensive therapy mentioned above. 2. I have recommended the possibility of trying her on levothyroxine combined with Cytomel which will give her both T4 and T3.  She will look into this with her pharmacy. 3. Blood work is ordered. 4. Follow-up in about 3 months.   No orders of the defined types were placed in this encounter.   Doree Albee, MD

## 2020-04-04 ENCOUNTER — Encounter (INDEPENDENT_AMBULATORY_CARE_PROVIDER_SITE_OTHER): Payer: Self-pay | Admitting: Internal Medicine

## 2020-04-04 ENCOUNTER — Other Ambulatory Visit (INDEPENDENT_AMBULATORY_CARE_PROVIDER_SITE_OTHER): Payer: Self-pay | Admitting: Internal Medicine

## 2020-04-04 DIAGNOSIS — N189 Chronic kidney disease, unspecified: Secondary | ICD-10-CM

## 2020-04-04 LAB — COMPLETE METABOLIC PANEL WITH GFR
AG Ratio: 1.4 (calc) (ref 1.0–2.5)
ALT: 14 U/L (ref 6–29)
AST: 14 U/L (ref 10–35)
Albumin: 4.1 g/dL (ref 3.6–5.1)
Alkaline phosphatase (APISO): 159 U/L — ABNORMAL HIGH (ref 31–125)
BUN/Creatinine Ratio: 11 (calc) (ref 6–22)
BUN: 16 mg/dL (ref 7–25)
CO2: 21 mmol/L (ref 20–32)
Calcium: 9.2 mg/dL (ref 8.6–10.2)
Chloride: 104 mmol/L (ref 98–110)
Creat: 1.4 mg/dL — ABNORMAL HIGH (ref 0.50–1.10)
GFR, Est African American: 51 mL/min/{1.73_m2} — ABNORMAL LOW (ref >=60)
GFR, Est Non African American: 44 mL/min/{1.73_m2} — ABNORMAL LOW (ref >=60)
Globulin: 3 g/dL (calc) (ref 1.9–3.7)
Glucose, Bld: 94 mg/dL (ref 65–99)
Potassium: 3.9 mmol/L (ref 3.5–5.3)
Sodium: 137 mmol/L (ref 135–146)
Total Bilirubin: 0.5 mg/dL (ref 0.2–1.2)
Total Protein: 7.1 g/dL (ref 6.1–8.1)

## 2020-04-04 LAB — HEMOGLOBIN A1C
Hgb A1c MFr Bld: 5.1 % of total Hgb (ref <5.7)
Mean Plasma Glucose: 100 (calc)
eAG (mmol/L): 5.5 (calc)

## 2020-04-10 ENCOUNTER — Other Ambulatory Visit (INDEPENDENT_AMBULATORY_CARE_PROVIDER_SITE_OTHER): Payer: Self-pay | Admitting: Internal Medicine

## 2020-04-10 DIAGNOSIS — I10 Essential (primary) hypertension: Secondary | ICD-10-CM

## 2020-04-11 ENCOUNTER — Encounter (INDEPENDENT_AMBULATORY_CARE_PROVIDER_SITE_OTHER): Payer: Self-pay | Admitting: Internal Medicine

## 2020-05-09 ENCOUNTER — Other Ambulatory Visit: Payer: Self-pay

## 2020-05-09 ENCOUNTER — Ambulatory Visit (INDEPENDENT_AMBULATORY_CARE_PROVIDER_SITE_OTHER): Payer: Medicare Other

## 2020-05-09 ENCOUNTER — Other Ambulatory Visit (INDEPENDENT_AMBULATORY_CARE_PROVIDER_SITE_OTHER): Payer: Self-pay | Admitting: Internal Medicine

## 2020-05-09 DIAGNOSIS — Z309 Encounter for contraceptive management, unspecified: Secondary | ICD-10-CM | POA: Diagnosis not present

## 2020-05-09 MED ORDER — MEDROXYPROGESTERONE ACETATE 150 MG/ML IM SUSP
150.0000 mg | INTRAMUSCULAR | 3 refills | Status: DC
Start: 2020-05-09 — End: 2021-05-07

## 2020-05-09 MED ORDER — MEDROXYPROGESTERONE ACETATE 150 MG/ML IM SUSP
150.0000 mg | Freq: Once | INTRAMUSCULAR | Status: AC
  Administered 2020-05-09: 150 mg via INTRAMUSCULAR

## 2020-05-09 NOTE — Progress Notes (Signed)
Patient came in for her Depo-Provera injection and supplied her own Medroxyprogesterone 150 mg for her injection. Patient was given injection in her Left Deltoid per her request. Patient tolerated the injection well.

## 2020-05-11 DIAGNOSIS — N1832 Chronic kidney disease, stage 3b: Secondary | ICD-10-CM | POA: Diagnosis not present

## 2020-05-11 DIAGNOSIS — E6609 Other obesity due to excess calories: Secondary | ICD-10-CM | POA: Diagnosis not present

## 2020-05-11 DIAGNOSIS — I129 Hypertensive chronic kidney disease with stage 1 through stage 4 chronic kidney disease, or unspecified chronic kidney disease: Secondary | ICD-10-CM | POA: Diagnosis not present

## 2020-05-11 DIAGNOSIS — Z79899 Other long term (current) drug therapy: Secondary | ICD-10-CM | POA: Diagnosis not present

## 2020-05-11 DIAGNOSIS — N200 Calculus of kidney: Secondary | ICD-10-CM | POA: Diagnosis not present

## 2020-05-11 DIAGNOSIS — R809 Proteinuria, unspecified: Secondary | ICD-10-CM | POA: Diagnosis not present

## 2020-05-14 DIAGNOSIS — Z79899 Other long term (current) drug therapy: Secondary | ICD-10-CM | POA: Diagnosis not present

## 2020-05-14 DIAGNOSIS — R809 Proteinuria, unspecified: Secondary | ICD-10-CM | POA: Diagnosis not present

## 2020-05-14 DIAGNOSIS — N200 Calculus of kidney: Secondary | ICD-10-CM | POA: Diagnosis not present

## 2020-05-14 DIAGNOSIS — N1831 Chronic kidney disease, stage 3a: Secondary | ICD-10-CM | POA: Diagnosis not present

## 2020-05-14 DIAGNOSIS — I129 Hypertensive chronic kidney disease with stage 1 through stage 4 chronic kidney disease, or unspecified chronic kidney disease: Secondary | ICD-10-CM | POA: Diagnosis not present

## 2020-05-15 ENCOUNTER — Other Ambulatory Visit (HOSPITAL_COMMUNITY): Payer: Self-pay | Admitting: Nephrology

## 2020-05-15 ENCOUNTER — Other Ambulatory Visit: Payer: Self-pay | Admitting: Nephrology

## 2020-05-15 DIAGNOSIS — R809 Proteinuria, unspecified: Secondary | ICD-10-CM

## 2020-05-24 ENCOUNTER — Other Ambulatory Visit: Payer: Self-pay

## 2020-05-24 ENCOUNTER — Ambulatory Visit (HOSPITAL_COMMUNITY)
Admission: RE | Admit: 2020-05-24 | Discharge: 2020-05-24 | Disposition: A | Discharge status: Home / Self Care | Payer: Medicare Other | Source: Ambulatory Visit | Attending: Nephrology | Admitting: Nephrology

## 2020-05-24 DIAGNOSIS — N200 Calculus of kidney: Secondary | ICD-10-CM | POA: Diagnosis not present

## 2020-05-24 DIAGNOSIS — R809 Proteinuria, unspecified: Secondary | ICD-10-CM

## 2020-06-21 DIAGNOSIS — N200 Calculus of kidney: Secondary | ICD-10-CM | POA: Diagnosis not present

## 2020-06-21 DIAGNOSIS — R809 Proteinuria, unspecified: Secondary | ICD-10-CM | POA: Diagnosis not present

## 2020-06-21 DIAGNOSIS — R768 Other specified abnormal immunological findings in serum: Secondary | ICD-10-CM | POA: Diagnosis not present

## 2020-06-21 DIAGNOSIS — E6609 Other obesity due to excess calories: Secondary | ICD-10-CM | POA: Diagnosis not present

## 2020-06-21 DIAGNOSIS — N270 Small kidney, unilateral: Secondary | ICD-10-CM | POA: Diagnosis not present

## 2020-06-21 DIAGNOSIS — N1831 Chronic kidney disease, stage 3a: Secondary | ICD-10-CM | POA: Diagnosis not present

## 2020-07-04 ENCOUNTER — Ambulatory Visit (INDEPENDENT_AMBULATORY_CARE_PROVIDER_SITE_OTHER): Payer: Medicare Other | Admitting: Internal Medicine

## 2020-07-04 ENCOUNTER — Other Ambulatory Visit: Payer: Self-pay

## 2020-07-04 ENCOUNTER — Encounter (INDEPENDENT_AMBULATORY_CARE_PROVIDER_SITE_OTHER): Payer: Self-pay | Admitting: Internal Medicine

## 2020-07-04 VITALS — BP 142/86 | HR 125 | Temp 97.8°F | Ht 63.0 in | Wt 212.8 lb

## 2020-07-04 DIAGNOSIS — I10 Essential (primary) hypertension: Secondary | ICD-10-CM

## 2020-07-04 DIAGNOSIS — E039 Hypothyroidism, unspecified: Secondary | ICD-10-CM | POA: Diagnosis not present

## 2020-07-04 DIAGNOSIS — N189 Chronic kidney disease, unspecified: Secondary | ICD-10-CM | POA: Diagnosis not present

## 2020-07-04 NOTE — Progress Notes (Signed)
Metrics: Intervention Frequency ACO  Documented Smoking Status Yearly  Screened one or more times in 24 months  Cessation Counseling or  Active cessation medication Past 24 months  Past 24 months   Guideline developer: UpToDate (See UpToDate for funding source) Date Released: 2014       Wellness Office Visit  Subjective:  Patient ID: [name], female    DOB: May 14, 1971  Age: 50 y.o. MRN: [id number]  CC: This lady comes in for follow-up of hypertension, chronic kidney disease, hypothyroidism and obesity. HPI  She finally did go and see Dr. Theador Hawthorne and he is managing her chronic and disease and has changed antihypertensive medications also. She did have a history of hypothyroidism and has not afforded NP thyroid. She continues to try and work with nutrition. She has no other specific complaints today. Past Medical History:  Diagnosis Date  . Anxiety   . CKD (chronic kidney disease) stage 3, GFR 30-59 ml/min ([place]) 03/15/2019  . Depression   . Diarrhea 05/04/2017  . Essential hypertension, benign 03/15/2019  . Hypertension   . Hypothyroidism, adult 03/15/2019  . Kidney stones   . Obesity (BMI 30-39.9) 03/15/2019  . Renal disorder   . Vitamin D deficiency disease 03/15/2019   Past Surgical History:  Procedure Laterality Date  . BIOPSY  06/05/2017   Procedure: BIOPSY;  Surgeon: Rogene Houston, MD;  [facility];  Service: Endoscopy;;  colon  . CHOLECYSTECTOMY    . COLONOSCOPY N/A 06/05/2017   Procedure: COLONOSCOPY;  Surgeon: Rogene Houston, MD;  [facility];  Service: Endoscopy;  Laterality: N/A;  8:30  . KIDNEY STONE SURGERY    . kidney stones       Family History  Problem Relation Age of Onset  . Cancer Mother   . Heart failure Mother   . Cancer Father   . Colon cancer Neg Hx     Social History   Social History Narrative   Divorced twice,1st lasted 5 years,2nd 4 years.On disabilty secondary to mental illness since 2004.Previously used  to [employer].Lives alone,has 2 cats.   Social History   Tobacco Use  . Smoking status: Passive Smoke Exposure - Never Smoker  . Smokeless tobacco: Never Used  Substance Use Topics  . Alcohol use: No    Current Meds  Medication Sig  . ALPRAZolam (XANAX) 1 MG tablet Take 1 mg by mouth 3 (three) times daily as needed for anxiety.   Marland Kitchen amantadine (SYMMETREL) 100 MG capsule Take 100 mg by mouth at bedtime.  Marland Kitchen amLODipine (NORVASC) 5 MG tablet TAKE 1 TABLET BY MOUTH TWICE DAILY  . ARIPiprazole (ABILIFY) 5 MG tablet Take 5 mg by mouth daily.  . carvedilol (COREG) 12.5 MG tablet Take by mouth.  . Cholecalciferol (VITAMIN D3) 125 MCG (5000 UT) TABS Take 10,000 Units by mouth daily.   . cloNIDine (CATAPRES) 0.3 MG tablet TAKE 1 TABLET BY MOUTH TWICE DAILY  . doxepin (SINEQUAN) 75 MG capsule Take 225 mg by mouth at bedtime.   . lamoTRIgine (LAMICTAL) 200 MG tablet Take 200 mg by mouth 2 (two) times daily.   Marland Kitchen losartan (COZAAR) 25 MG tablet Take 25 mg by mouth daily.  . medroxyPROGESTERone (DEPO-PROVERA) 150 MG/ML injection Inject 1 mL (150 mg total) into the muscle every 3 (three) months.  . potassium chloride (KLOR-CON) 10 MEQ tablet TAKE TWO TABLETS BY MOUTH EVERY DAY  . QUEtiapine (SEROQUEL XR) 400 MG 24 hr tablet Take 400 mg by  mouth at bedtime.       Depression screen [facility] 2/9 07/04/2020 09/16/2019  Decreased Interest 0 0  Down, Depressed, Hopeless 0 0  PHQ - 2 Score 0 0  Altered sleeping 0 -  Tired, decreased energy 1 -  Change in appetite 0 -  Feeling bad or failure about yourself  0 -  Trouble concentrating 0 -  Moving slowly or fidgety/restless 0 -  Suicidal thoughts 0 -  PHQ-9 Score 1 -  Difficult doing work/chores Not difficult at all -     Objective:   Today's Vitals: BP (!) 142/86   Pulse (!) 125   Temp 97.8 F (36.6 C) (Temporal)   Ht 5\' 3"  (1.6 m)   Wt 212 lb 12.8 oz (96.5 kg)   SpO2 98%   BMI 37.70 kg/m  Vitals with BMI 07/04/2020 04/03/2020 02/09/2020   Height 5\' 3"  5\' 3"  5\' 2"   Weight 212 lbs 13 oz 212 lbs 3 oz 209 lbs  BMI 37.71 74.0 81.44  Systolic 818 563 -  Diastolic 86 90 -  Pulse 149 111 -     Physical Exam   She looks systemically well.  She remains obese.  Blood pressure somewhat still elevated.  She is alert and orientated without any focal logical signs.    Assessment   1. Essential hypertension, benign   2. Chronic kidney disease, unspecified CKD stage   3. Hypothyroidism, adult       Tests ordered Orders Placed This Encounter  Procedures  . T3, free  . T4, free  . TSH     Plan: 1. Hypertension will be managed with Dr. Theador Hawthorne as he is adjusting medications. 2. Chronic any disease, managed by Dr. Theador Hawthorne. 3. I will check thyroid function test again to see if they are abnormal and if they are, probably I will start her on levothyroxine which is affordable for her. 4. Follow-up with [name] in about 6 months   No orders of the defined types were placed in this encounter.   Doree Albee, MD

## 2020-07-05 DIAGNOSIS — F3175 Bipolar disorder, in partial remission, most recent episode depressed: Secondary | ICD-10-CM | POA: Diagnosis not present

## 2020-07-05 DIAGNOSIS — F4011 Social phobia, generalized: Secondary | ICD-10-CM | POA: Diagnosis not present

## 2020-07-05 LAB — T4, FREE: Free T4: 1 ng/dL (ref 0.8–1.8)

## 2020-07-05 LAB — TSH: TSH: 4.61 mIU/L — ABNORMAL HIGH

## 2020-07-05 LAB — T3, FREE: T3, Free: 3.3 pg/mL (ref 2.3–4.2)

## 2020-07-09 ENCOUNTER — Encounter (INDEPENDENT_AMBULATORY_CARE_PROVIDER_SITE_OTHER): Payer: Self-pay | Admitting: Internal Medicine

## 2020-07-10 ENCOUNTER — Other Ambulatory Visit (INDEPENDENT_AMBULATORY_CARE_PROVIDER_SITE_OTHER): Payer: Self-pay | Admitting: Internal Medicine

## 2020-07-10 MED ORDER — NP THYROID 60 MG PO TABS: 60.0000 mg | ORAL_TABLET | Freq: Every day | ORAL | 3 refills | Status: DC

## 2020-07-27 DIAGNOSIS — R768 Other specified abnormal immunological findings in serum: Secondary | ICD-10-CM | POA: Diagnosis not present

## 2020-07-27 DIAGNOSIS — E6609 Other obesity due to excess calories: Secondary | ICD-10-CM | POA: Diagnosis not present

## 2020-07-27 DIAGNOSIS — R809 Proteinuria, unspecified: Secondary | ICD-10-CM | POA: Diagnosis not present

## 2020-07-27 DIAGNOSIS — N270 Small kidney, unilateral: Secondary | ICD-10-CM | POA: Diagnosis not present

## 2020-07-27 DIAGNOSIS — N1831 Chronic kidney disease, stage 3a: Secondary | ICD-10-CM | POA: Diagnosis not present

## 2020-07-27 DIAGNOSIS — N200 Calculus of kidney: Secondary | ICD-10-CM | POA: Diagnosis not present

## 2020-08-03 DIAGNOSIS — E6609 Other obesity due to excess calories: Secondary | ICD-10-CM | POA: Diagnosis not present

## 2020-08-03 DIAGNOSIS — N1831 Chronic kidney disease, stage 3a: Secondary | ICD-10-CM | POA: Diagnosis not present

## 2020-08-03 DIAGNOSIS — R809 Proteinuria, unspecified: Secondary | ICD-10-CM | POA: Diagnosis not present

## 2020-08-03 DIAGNOSIS — N200 Calculus of kidney: Secondary | ICD-10-CM | POA: Diagnosis not present

## 2020-08-03 DIAGNOSIS — I129 Hypertensive chronic kidney disease with stage 1 through stage 4 chronic kidney disease, or unspecified chronic kidney disease: Secondary | ICD-10-CM | POA: Diagnosis not present

## 2020-08-03 DIAGNOSIS — R82991 Hypocitraturia: Secondary | ICD-10-CM | POA: Diagnosis not present

## 2020-08-06 ENCOUNTER — Ambulatory Visit (INDEPENDENT_AMBULATORY_CARE_PROVIDER_SITE_OTHER): Payer: Medicare Other | Admitting: Adult Health

## 2020-08-06 ENCOUNTER — Other Ambulatory Visit: Payer: Self-pay

## 2020-08-06 ENCOUNTER — Encounter: Payer: Self-pay | Admitting: Adult Health

## 2020-08-06 ENCOUNTER — Other Ambulatory Visit (HOSPITAL_COMMUNITY)
Admission: RE | Admit: 2020-08-06 | Discharge: 2020-08-06 | Disposition: A | Discharge status: Home / Self Care | Payer: Medicare Other | Source: Ambulatory Visit | Attending: Adult Health | Admitting: Adult Health

## 2020-08-06 VITALS — BP 156/99 | HR 107 | Ht 62.0 in | Wt 220.0 lb

## 2020-08-06 DIAGNOSIS — Z1231 Encounter for screening mammogram for malignant neoplasm of breast: Secondary | ICD-10-CM

## 2020-08-06 DIAGNOSIS — N852 Hypertrophy of uterus: Secondary | ICD-10-CM

## 2020-08-06 DIAGNOSIS — Z3042 Encounter for surveillance of injectable contraceptive: Secondary | ICD-10-CM

## 2020-08-06 DIAGNOSIS — Z01419 Encounter for gynecological examination (general) (routine) without abnormal findings: Secondary | ICD-10-CM

## 2020-08-06 HISTORY — DX: Hypertrophy of uterus: N85.2

## 2020-08-06 MED ORDER — MEDROXYPROGESTERONE ACETATE 150 MG/ML IM SUSP
150.0000 mg | Freq: Once | INTRAMUSCULAR | Status: AC
  Administered 2020-08-06: 150 mg via INTRAMUSCULAR

## 2020-08-06 NOTE — Progress Notes (Signed)
Patient ID: [name], female   DOB: 03/26/1971, 50 y.o.   MRN: [id number] History of Present Illness:  [name] is a 50 year old white female, divorced, G1P0010, in for a well woman gyn exam and pap. And she gets depo today. PCP is Dr [name] and she sees Dr [name] for HTN and CKD stage 3b.  Current Medications, Allergies, Past Medical History, Past Surgical History, Family History and Social History were reviewed in [employer] record.     Review of Systems: Patient denies any headaches, hearing loss, fatigue, blurred vision, shortness of breath, chest pain, abdominal pain, problems with bowel movements, urination, or intercourse. No joint pain or mood swings.    Physical Exam:BP (!) 156/99 (BP Location: Left Arm, Patient Position: Sitting, Cuff Size: Normal)   Pulse (!) 107   Ht 5\' 2"  (1.575 m)   Wt 220 lb (99.8 kg)   BMI 40.24 kg/m  General:  Well developed, well nourished, no acute distress Skin:  Warm and dry Neck:  Midline trachea, normal thyroid, good ROM, no lymphadenopathy Lungs; Clear to auscultation bilaterally Breast:  No dominant palpable mass, retraction, or nipple discharge Cardiovascular: Regular rate and rhythm Abdomen:  Soft, non tender, no hepatosplenomegaly Pelvic:  External genitalia is normal in appearance, no lesions.  The vagina is normal in appearance. Urethra has no lesions or masses. The cervix is smooth and nulliparous with tiny os that bleed with EC brush when pap with HRHPV genotyping performed.  Uterus is felt to be enlarged, was non tender.  No adnexal masses or tenderness noted.Bladder is non tender, no masses felt. Rectal: Deferred  Extremities/musculoskeletal:  No swelling or varicosities noted, no clubbing or cyanosis Psych:  No mood changes, alert and cooperative,seems happy AA is 0 Fall risk is low PHQ 9 score is 22, no SI or HI, is on meds and sees [employer] [facility] GAD 7 score I 20  [facility] - 08/06/20 1352       Pregnancy Intention Screening   Does the patient want to become pregnant in the next year? No    Does the patient's partner want to become pregnant in the next year? No    Would the patient like to discuss contraceptive options today? No      Contraception Wrap Up   Current Method Hormonal Injection    End Method Hormonal Injection    Contraception Counseling Provided No          Examination chaperoned by Sabrina Squibb LPN   Impression and Plan: 1. Encounter for gynecological examination with Papanicolaou smear of cervix Pap sent Pap in 3 years if normal Physical with PCP Labs with PCP and Dr [name] Colonoscopy per Gi, has had 2 she says   2. Depo-Provera contraceptive status She received depo in office,from her vial  3. Enlarged uterus Return in 3 weeks for GYN [place] to assess uterus   4. Screening mammogram for breast cancer Pt to get mammogram at the [facility] in [place]

## 2020-08-07 ENCOUNTER — Other Ambulatory Visit (INDEPENDENT_AMBULATORY_CARE_PROVIDER_SITE_OTHER): Payer: Self-pay | Admitting: Internal Medicine

## 2020-08-07 DIAGNOSIS — R87612 Low grade squamous intraepithelial lesion on cytologic smear of cervix (LGSIL): Secondary | ICD-10-CM | POA: Diagnosis not present

## 2020-08-16 LAB — CYTOLOGY - PAP
Comment: NEGATIVE
Comment: NEGATIVE
HPV 16: NEGATIVE
HPV 18 / 45: NEGATIVE
High risk HPV: POSITIVE — AB

## 2020-08-17 DIAGNOSIS — R87612 Low grade squamous intraepithelial lesion on cytologic smear of cervix (LGSIL): Secondary | ICD-10-CM | POA: Insufficient documentation

## 2020-08-17 HISTORY — DX: Low grade squamous intraepithelial lesion on cytologic smear of cervix (LGSIL): R87.612

## 2020-08-17 NOTE — Telephone Encounter (Signed)
Pt aware that pap is +HPV and has low grade lesion, possibly higher, needs colpo, so call office Monday for colpo appointment

## 2020-08-21 ENCOUNTER — Encounter: Payer: Self-pay | Admitting: Urology

## 2020-08-21 ENCOUNTER — Ambulatory Visit (HOSPITAL_COMMUNITY)
Admission: RE | Admit: 2020-08-21 | Discharge: 2020-08-21 | Disposition: A | Discharge status: Home / Self Care | Payer: Medicare Other | Source: Ambulatory Visit | Attending: Urology | Admitting: Urology

## 2020-08-21 ENCOUNTER — Ambulatory Visit (INDEPENDENT_AMBULATORY_CARE_PROVIDER_SITE_OTHER): Payer: Medicare Other | Admitting: Urology

## 2020-08-21 ENCOUNTER — Other Ambulatory Visit: Payer: Self-pay

## 2020-08-21 VITALS — BP 135/85 | HR 99 | Temp 98.2°F | Ht 62.0 in | Wt 215.0 lb

## 2020-08-21 DIAGNOSIS — Z9049 Acquired absence of other specified parts of digestive tract: Secondary | ICD-10-CM | POA: Diagnosis not present

## 2020-08-21 DIAGNOSIS — N200 Calculus of kidney: Secondary | ICD-10-CM

## 2020-08-21 DIAGNOSIS — R319 Hematuria, unspecified: Secondary | ICD-10-CM | POA: Diagnosis not present

## 2020-08-21 DIAGNOSIS — N261 Atrophy of kidney (terminal): Secondary | ICD-10-CM | POA: Diagnosis not present

## 2020-08-21 LAB — URINALYSIS, ROUTINE W REFLEX MICROSCOPIC
Bilirubin, UA: NEGATIVE
Glucose, UA: NEGATIVE
Ketones, UA: NEGATIVE
Nitrite, UA: POSITIVE — AB
Specific Gravity, UA: 1.02 (ref 1.005–1.030)
Urobilinogen, Ur: 0.2 mg/dL (ref 0.2–1.0)
pH, UA: 7 (ref 5.0–7.5)

## 2020-08-21 LAB — MICROSCOPIC EXAMINATION
Renal Epithel, UA: NONE SEEN /hpf
WBC, UA: >30 /hpf — AB (ref 0–5)

## 2020-08-21 MED ORDER — NITROFURANTOIN MONOHYD MACRO 100 MG PO CAPS: 100.0000 mg | ORAL_CAPSULE | Freq: Two times a day (BID) | ORAL | 0 refills | Status: DC

## 2020-08-21 NOTE — Patient Instructions (Signed)

## 2020-08-21 NOTE — Progress Notes (Signed)
Urological Symptom Review  Patient is experiencing the following symptoms: Hard to postpone urination Get up at night to urinate Leakage of urine Blood in urine Urinary tract infection Injury to kidneys/bladder  Kidney stones   Review of Systems  Gastrointestinal (upper)  : Negative for upper GI symptoms  Gastrointestinal (lower) : Diarrhea Constipation  Constitutional : Night Sweats Fatigue  Skin: Negative for skin symptoms  Eyes: Negative for eye symptoms  Ear/Nose/Throat : Negative for Ear/Nose/Throat symptoms  Hematologic/Lymphatic: Negative for Hematologic/Lymphatic symptoms  Cardiovascular : Negative for cardiovascular symptoms  Respiratory : Shortness of breath  Endocrine: Negative for endocrine symptoms  Musculoskeletal: Back pain  Neurological: Negative for neurological symptoms  Psychologic: Depression Anxiety

## 2020-08-21 NOTE — Progress Notes (Signed)
08/21/2020 3:07 PM   [name] [birth year] [id number]  Referring provider: Liana Gerold, MD [phone],  [place] [address]  Nephrolithiasis  HPI: Mr [name] is a 50yo here for evaluation of nephrolithiasis. She was diagnosed with a 45mm left renal calculus. She has been having intermittent left flank pain for the past 3 months. She developed gross hematuria 1 week ago.  She has associated urinary urgency, frequency and occasional dysuria. UA is concerning for infection.    PMH: Past Medical History:  Diagnosis Date  . Anxiety   . Chronic kidney disease   . CKD (chronic kidney disease) stage 3, GFR 30-59 ml/min ([place]) 03/15/2019  . Depression   . Diarrhea 05/04/2017  . Essential hypertension, benign 03/15/2019  . Hypertension   . Hypothyroidism   . Hypothyroidism, adult 03/15/2019  . Kidney stones   . LGSIL on Pap smear of cervix 08/17/2020   Needs colpo per [facility] immediate risk is 4.3 for CIN 3+risk   . Obesity (BMI 30-39.9) 03/15/2019  . Renal disorder   . Vitamin D deficiency disease 03/15/2019    Surgical History: Past Surgical History:  Procedure Laterality Date  . BIOPSY  06/05/2017   Procedure: BIOPSY;  Surgeon: Rogene Houston, MD;  [facility];  Service: Endoscopy;;  colon  . CHOLECYSTECTOMY    . COLONOSCOPY N/A 06/05/2017   Procedure: COLONOSCOPY;  Surgeon: Rogene Houston, MD;  [facility];  Service: Endoscopy;  Laterality: N/A;  8:30  . KIDNEY STONE SURGERY    . kidney stones      Home Medications:  Allergies as of 08/21/2020      Reactions   Morphine Hives   Morphine And Related Itching, Hives   Sulfasalazine Swelling   Sulfa Antibiotics Swelling   Tape Other (See Comments)   Surgical    Other Itching, Other (See Comments)   Surgical       Medication List       Accurate as of August 21, 2020  3:07 PM. If you have any questions, ask your nurse or doctor.        STOP taking these medications    potassium chloride 10 MEQ tablet Commonly known as: KLOR-CON Stopped by: Nicolette Bang, MD     TAKE these medications   ALPRAZolam 1 MG tablet Commonly known as: XANAX Take 1 mg by mouth 3 (three) times daily as needed for anxiety.   amantadine 100 MG capsule Commonly known as: SYMMETREL Take 100 mg by mouth at bedtime.   amLODipine 5 MG tablet Commonly known as: NORVASC TAKE 1 TABLET BY MOUTH TWICE DAILY   ARIPiprazole 5 MG tablet Commonly known as: ABILIFY Take 5 mg by mouth daily.   carvedilol 12.5 MG tablet Commonly known as: COREG Take by mouth.   cloNIDine 0.3 MG tablet Commonly known as: CATAPRES TAKE 1 TABLET BY MOUTH TWICE DAILY   doxepin 75 MG capsule Commonly known as: SINEQUAN Take 225 mg by mouth at bedtime.   lamoTRIgine 200 MG tablet Commonly known as: LAMICTAL Take 200 mg by mouth 2 (two) times daily.   losartan 25 MG tablet Commonly known as: COZAAR Take 25 mg by mouth daily.   medroxyPROGESTERone 150 MG/ML injection Commonly known as: DEPO-PROVERA Inject 1 mL (150 mg total) into the muscle every 3 (three) months.   NP Thyroid 60 MG tablet Generic drug: thyroid Take 1 tablet (60 mg total) by mouth daily before breakfast.   potassium citrate  5 MEQ (540 MG) SR tablet Commonly known as: UROCIT-K Take 10 mEq by mouth 3 (three) times daily.   QUEtiapine 400 MG 24 hr tablet Commonly known as: SEROQUEL XR Take 400 mg by mouth at bedtime.   Vitamin D3 125 MCG (5000 UT) Tabs Take 10,000 Units by mouth daily.       Allergies:  Allergies  Allergen Reactions  . Morphine Hives  . Morphine And Related Itching and Hives  . Sulfasalazine Swelling  . Sulfa Antibiotics Swelling  . Tape Other (See Comments)    Surgical   . Other Itching and Other (See Comments)    Surgical     Family History: Family History  Problem Relation Age of Onset  . Cancer Father   . Heart failure Father   . Cancer Paternal Grandmother   . Heart failure  Paternal Grandmother   . Cancer Maternal Grandmother   . Colon cancer Neg Hx     Social History:  reports that she is a non-smoker but has been exposed to tobacco smoke. She has never used smokeless tobacco. She reports that she does not drink alcohol and does not use drugs.  ROS: All other review of systems were reviewed and are negative except what is noted above in HPI  Physical Exam: BP 135/85   Pulse 99   Temp 98.2 F (36.8 C)   Ht 5\' 2"  (1.575 m)   Wt 215 lb (97.5 kg)   BMI 39.32 kg/m   Constitutional:  Alert and oriented, No acute distress. HEENT: [place] AT, moist mucus membranes.  Trachea midline, no masses. Cardiovascular: No clubbing, cyanosis, or edema. Respiratory: Normal respiratory effort, no increased work of breathing. GI: Abdomen is soft, nontender, nondistended, no abdominal masses GU: No CVA tenderness.  Lymph: No cervical or inguinal lymphadenopathy. Skin: No rashes, bruises or suspicious lesions. Neurologic: Grossly intact, no focal deficits, moving all 4 extremities. Psychiatric: Normal mood and affect.  Laboratory Data: Lab Results  Component Value Date   WBC 9.9 04/10/2016   HGB 12.3 04/10/2016   HCT 37.0 04/10/2016   MCV 90.2 04/10/2016   PLT 267 04/10/2016    Lab Results  Component Value Date   CREATININE 1.40 (H) 04/03/2020    No results found for: PSA  No results found for: TESTOSTERONE  Lab Results  Component Value Date   HGBA1C 5.1 04/03/2020    Urinalysis    Component Value Date/Time   COLORURINE YELLOW 04/10/2016 0531   APPEARANCEUR CLOUDY (A) 04/10/2016 0531   LABSPEC >1.030 (H) 04/10/2016 0531   PHURINE 5.5 04/10/2016 0531   GLUCOSEU NEGATIVE 04/10/2016 0531   HGBUR LARGE (A) 04/10/2016 0531   BILIRUBINUR NEGATIVE 04/10/2016 0531   KETONESUR NEGATIVE 04/10/2016 0531   PROTEINUR TRACE (A) 04/10/2016 0531   UROBILINOGEN 0.2 01/17/2009 0124   NITRITE NEGATIVE 04/10/2016 0531   LEUKOCYTESUR NEGATIVE 04/10/2016 0531     Lab Results  Component Value Date   BACTERIA MANY (A) 04/10/2016    Pertinent Imaging:  Results for orders placed during the hospital encounter of 06/18/10  DG Abd 1 View  Narrative Clinical Data: Left flank pain and history of renal calculi.  ABDOMEN - 1 VIEW  Comparison: 01/13/2009  Findings: A roughly triangular shaped, 8 mm calcification in the left abdomen likely relates to a left-sided renal calculus.  It is difficult to tell the exact location of this calcification.  Tiny calculi are present in the lower pole of the right kidney.  No abnormal calcifications are  seen along the expected course of the ureters or overlying the bladder.  Bowel gas pattern is unremarkable.  IMPRESSION: 8 mm triangular shaped calcification in the left abdomen likely relates to a left renal calculus.  Tiny calculi project over the lower pole of the right kidney.  Provider: Briscoe Burns  No results found for this or any previous visit.  No results found for this or any previous visit.  No results found for this or any previous visit.  Results for orders placed during the hospital encounter of 05/24/20  US RENAL  Narrative CLINICAL DATA:  Proteinuria  EXAM: RENAL / URINARY TRACT ULTRASOUND COMPLETE  COMPARISON:  CT abdomen 03/21/2019  FINDINGS: Right Kidney:  Renal measurements: 12 x 5.2 x 5.8 cm = volume: 189 mL. Echogenicity within normal limits. No mass or hydronephrosis visualized.  Left Kidney:  Renal measurements: 10.5 x 4.9 x 5 cm = volume: 136 mL. Mild renal cortical thinning. 12 mm nonobstructing left renal calculus. Mild increased echogenicity. No mass or hydronephrosis visualized.  Bladder:  Appears normal for degree of bladder distention.  Other:  None.  IMPRESSION: 1. No obstructive uropathy. 2. A 12 mm nonobstructing left renal calculus. 3. Atrophic left kidney.   Electronically Signed By: Kathreen Devoid On: 05/24/2020 13:59  No results  found for this or any previous visit.  No results found for this or any previous visit.  Results for orders placed during the hospital encounter of 04/10/16  CT Renal Stone Study  Narrative CLINICAL DATA:  Persistent right flank pain. CT scan done yesterday showed a distal right ureteral stone.  EXAM: CT ABDOMEN AND PELVIS WITHOUT CONTRAST  TECHNIQUE: Multidetector CT imaging of the abdomen and pelvis was performed following the standard protocol without IV contrast.  COMPARISON:  CT 04/09/2016 and 03/13/2016  FINDINGS: Lower chest: Lung bases are clear.  No pleural or pericardial fluid.  Hepatobiliary: Normal appearance of the liver. Previous cholecystectomy.  Pancreas: Normal  Spleen: Normal  Adrenals/Urinary Tract: Adrenal glands are normal. Left kidney shows atrophy but no focal lesion. Right kidney is swollen with hydroureteronephrosis an dilation of the ureter to the bladder. 2 mm stone seen yesterday in the distal right ureter has passed and is not visible within the bladder. 2 mm nonobstructing stone in the lower pole the right kidney.  Stomach/Bowel: Normal.  Normal appearing appendix.  Vascular/Lymphatic: Normal  Reproductive: Normal.  Question partial hysterectomy.  Other: No free fluid or air.  Musculoskeletal: Normal  IMPRESSION: 2 mm stone seen within the distal right ureter yesterday has passed. This is not seen within the ureter or within the bladder. There continues to be swelling of the right kidney with moderate hydroureteronephrosis.   Electronically Signed By: [place] Chimes M.D. On: 04/10/2016 08:04   Assessment & Plan:    1. Nephrolithiasis -STAT CT stone study - Urinalysis, Routine w reflex microscopic   No follow-ups on file.  Nicolette Bang, MD  [facility] [place]

## 2020-08-22 ENCOUNTER — Telehealth: Payer: Self-pay | Admitting: Adult Health

## 2020-08-22 NOTE — Telephone Encounter (Signed)
Has appt for colpo

## 2020-08-24 ENCOUNTER — Other Ambulatory Visit: Payer: Self-pay

## 2020-08-24 ENCOUNTER — Encounter: Payer: Self-pay | Admitting: Urology

## 2020-08-24 LAB — URINE CULTURE

## 2020-08-27 ENCOUNTER — Other Ambulatory Visit: Payer: Self-pay

## 2020-08-27 ENCOUNTER — Ambulatory Visit (INDEPENDENT_AMBULATORY_CARE_PROVIDER_SITE_OTHER): Payer: Medicare Other

## 2020-08-27 DIAGNOSIS — N852 Hypertrophy of uterus: Secondary | ICD-10-CM

## 2020-08-27 NOTE — Progress Notes (Signed)
PELVIC US TM:[id number] heterogeneous axial positioned uterus,EEC 6.3 mm with mult.small calcifications,normal ovaries,ovaries appear mobile,no free fluid,limited view  Chaperone Peggy

## 2020-08-28 ENCOUNTER — Telehealth: Payer: Self-pay

## 2020-08-28 ENCOUNTER — Ambulatory Visit (HOSPITAL_COMMUNITY)
Admission: RE | Admit: 2020-08-28 | Discharge: 2020-08-28 | Disposition: A | Discharge status: Home / Self Care | Payer: Medicare Other | Source: Ambulatory Visit | Attending: Urology | Admitting: Urology

## 2020-08-28 ENCOUNTER — Other Ambulatory Visit: Payer: Self-pay

## 2020-08-28 DIAGNOSIS — N200 Calculus of kidney: Secondary | ICD-10-CM

## 2020-08-28 NOTE — Telephone Encounter (Signed)
Pt notified of CT result, to finish Macrobid and to get KUB today before her appointment in the morning.

## 2020-08-28 NOTE — Telephone Encounter (Signed)
-----   Message from Dorisann Frames, RN sent at 08/28/2020 11:33 AM EDT -----  ----- Message ----- From: Cleon Gustin, MD Sent: 08/28/2020   8:39 AM EDT To: Dorisann Frames, RN  Macrobid 100mg  BID for 7 days. Also CT showed a left 13mm lower pole calculus,. Please have her get a KUB prior to her followup ----- Message ----- From: Dorisann Frames, RN Sent: 08/24/2020  12:07 PM EDT To: Cleon Gustin, MD  Please review

## 2020-08-29 ENCOUNTER — Encounter: Payer: Self-pay | Admitting: Urology

## 2020-08-29 ENCOUNTER — Ambulatory Visit (INDEPENDENT_AMBULATORY_CARE_PROVIDER_SITE_OTHER): Payer: Medicare Other | Admitting: Urology

## 2020-08-29 ENCOUNTER — Other Ambulatory Visit: Payer: Self-pay

## 2020-08-29 VITALS — BP 133/83 | HR 103 | Temp 98.5°F | Ht 62.0 in | Wt 217.0 lb

## 2020-08-29 DIAGNOSIS — N200 Calculus of kidney: Secondary | ICD-10-CM | POA: Diagnosis not present

## 2020-08-29 DIAGNOSIS — R3129 Other microscopic hematuria: Secondary | ICD-10-CM

## 2020-08-29 LAB — URINALYSIS, ROUTINE W REFLEX MICROSCOPIC
Bilirubin, UA: NEGATIVE
Glucose, UA: NEGATIVE
Ketones, UA: NEGATIVE
Nitrite, UA: NEGATIVE
Protein,UA: NEGATIVE
Specific Gravity, UA: 1.02 (ref 1.005–1.030)
Urobilinogen, Ur: 0.2 mg/dL (ref 0.2–1.0)
pH, UA: 6 (ref 5.0–7.5)

## 2020-08-29 LAB — MICROSCOPIC EXAMINATION
Epithelial Cells (non renal): >10 /hpf — AB (ref 0–10)
Renal Epithel, UA: NONE SEEN /hpf

## 2020-08-29 MED ORDER — OXYCODONE-ACETAMINOPHEN 5-325 MG PO TABS: 1.0000 | ORAL_TABLET | ORAL | 0 refills | Status: DC | PRN

## 2020-08-29 NOTE — Progress Notes (Signed)
08/29/2020 9:08 AM   [name] 12-31-1970 [id number]  Referring provider: Doree Albee, MD [address] [place],  [place] [address]  nephrolithiasis  HPI: [name] is a 50yo here for followup for nephrolithiasis. SHe underwent CT and then KUB which shows a left 52mm renal pelvis calculus. She has intermittent left flank pain. No worsening LUTS    PMH: Past Medical History:  Diagnosis Date  . Anxiety   . Chronic kidney disease   . CKD (chronic kidney disease) stage 3, GFR 30-59 ml/min ([place]) 03/15/2019  . Depression   . Diarrhea 05/04/2017  . Essential hypertension, benign 03/15/2019  . Hypertension   . Hypothyroidism   . Hypothyroidism, adult 03/15/2019  . Kidney stones   . LGSIL on Pap smear of cervix 08/17/2020   Needs colpo per ASCCP immediate risk is 4.3 for CIN 3+risk   . Obesity (BMI 30-39.9) 03/15/2019  . Renal disorder   . Vitamin D deficiency disease 03/15/2019    Surgical History: Past Surgical History:  Procedure Laterality Date  . BIOPSY  06/05/2017   Procedure: BIOPSY;  Surgeon: Rogene Houston, MD;  [facility];  Service: Endoscopy;;  colon  . CHOLECYSTECTOMY    . COLONOSCOPY N/A 06/05/2017   Procedure: COLONOSCOPY;  Surgeon: Rogene Houston, MD;  Location: [facility];  Service: Endoscopy;  Laterality: N/A;  8:30  . KIDNEY STONE SURGERY    . kidney stones      Home Medications:  Allergies as of 08/29/2020      Reactions   Morphine Hives   Morphine And Related Itching, Hives   Sulfasalazine Swelling   Sulfa Antibiotics Swelling   Tape Other (See Comments)   Surgical    Other Itching, Other (See Comments)   Surgical       Medication List       Accurate as of August 29, 2020  9:08 AM. If you have any questions, ask your nurse or doctor.        ALPRAZolam 1 MG tablet Commonly known as: XANAX Take 1 mg by mouth 3 (three) times daily as needed for anxiety.   amantadine 100 MG capsule Commonly known as: SYMMETREL Take  100 mg by mouth at bedtime.   amLODipine 5 MG tablet Commonly known as: NORVASC TAKE 1 TABLET BY MOUTH TWICE DAILY   ARIPiprazole 5 MG tablet Commonly known as: ABILIFY Take 5 mg by mouth daily.   carvedilol 12.5 MG tablet Commonly known as: COREG Take by mouth.   cloNIDine 0.3 MG tablet Commonly known as: CATAPRES TAKE 1 TABLET BY MOUTH TWICE DAILY   doxepin 75 MG capsule Commonly known as: SINEQUAN Take 225 mg by mouth at bedtime.   lamoTRIgine 200 MG tablet Commonly known as: LAMICTAL Take 200 mg by mouth 2 (two) times daily.   losartan 25 MG tablet Commonly known as: COZAAR Take 25 mg by mouth daily.   medroxyPROGESTERone 150 MG/ML injection Commonly known as: DEPO-PROVERA Inject 1 mL (150 mg total) into the muscle every 3 (three) months.   nitrofurantoin (macrocrystal-monohydrate) 100 MG capsule Commonly known as: MACROBID Take 1 capsule (100 mg total) by mouth every 12 (twelve) hours.   NP Thyroid 60 MG tablet Generic drug: thyroid Take 1 tablet (60 mg total) by mouth daily before breakfast.   potassium citrate 5 MEQ (540 MG) SR tablet Commonly known as: UROCIT-K Take 10 mEq by mouth 3 (three) times daily.   QUEtiapine 400 MG 24 hr tablet  Commonly known as: SEROQUEL XR Take 400 mg by mouth at bedtime.   Vitamin D3 125 MCG (5000 UT) Tabs Take 10,000 Units by mouth daily.       Allergies:  Allergies  Allergen Reactions  . Morphine Hives  . Morphine And Related Itching and Hives  . Sulfasalazine Swelling  . Sulfa Antibiotics Swelling  . Tape Other (See Comments)    Surgical   . Other Itching and Other (See Comments)    Surgical     Family History: Family History  Problem Relation Age of Onset  . Cancer Father   . Heart failure Father   . Cancer Paternal Grandmother   . Heart failure Paternal Grandmother   . Cancer Maternal Grandmother   . Colon cancer Neg Hx     Social History:  reports that she is a non-smoker but has been exposed to  tobacco smoke. She has never used smokeless tobacco. She reports that she does not drink alcohol and does not use drugs.  ROS: All other review of systems were reviewed and are negative except what is noted above in HPI  Physical Exam: BP 133/83   Pulse (!) 103   Temp 98.5 F (36.9 C)   Ht 5\' 2"  (1.575 m)   Wt 217 lb (98.4 kg)   BMI 39.69 kg/m   Constitutional:  Alert and oriented, No acute distress. HEENT: [place] AT, moist mucus membranes.  Trachea midline, no masses. Cardiovascular: No clubbing, cyanosis, or edema. Respiratory: Normal respiratory effort, no increased work of breathing. GI: Abdomen is soft, nontender, nondistended, no abdominal masses GU: No CVA tenderness.  Lymph: No cervical or inguinal lymphadenopathy. Skin: No rashes, bruises or suspicious lesions. Neurologic: Grossly intact, no focal deficits, moving all 4 extremities. Psychiatric: Normal mood and affect.  Laboratory Data: Lab Results  Component Value Date   WBC 9.9 04/10/2016   HGB 12.3 04/10/2016   HCT 37.0 04/10/2016   MCV 90.2 04/10/2016   PLT 267 04/10/2016    Lab Results  Component Value Date   CREATININE 1.40 (H) 04/03/2020    No results found for: PSA  No results found for: TESTOSTERONE  Lab Results  Component Value Date   HGBA1C 5.1 04/03/2020    Urinalysis    Component Value Date/Time   COLORURINE YELLOW 04/10/2016 0531   APPEARANCEUR Hazy (A) 08/21/2020 1425   LABSPEC >1.030 (H) 04/10/2016 0531   PHURINE 5.5 04/10/2016 0531   GLUCOSEU Negative 08/21/2020 1425   HGBUR LARGE (A) 04/10/2016 0531   BILIRUBINUR Negative 08/21/2020 1425   KETONESUR NEGATIVE 04/10/2016 0531   PROTEINUR 1+ (A) 08/21/2020 1425   PROTEINUR TRACE (A) 04/10/2016 0531   UROBILINOGEN 0.2 01/17/2009 0124   NITRITE Positive (A) 08/21/2020 1425   NITRITE NEGATIVE 04/10/2016 0531   LEUKOCYTESUR 2+ (A) 08/21/2020 1425    Lab Results  Component Value Date   LABMICR See below: 08/21/2020   WBCUA >30 (A)  08/21/2020   LABEPIT 0-10 08/21/2020   MUCUS Present 08/21/2020   BACTERIA Many (A) 08/21/2020    Pertinent Imaging: CT 3/22 and KUB 3/29: Images reviewed and discussed with the patient Results for orders placed during the hospital encounter of 06/18/10  DG Abd 1 View  Narrative Clinical Data: Left flank pain and history of renal calculi.  ABDOMEN - 1 VIEW  Comparison: 01/13/2009  Findings: A roughly triangular shaped, 8 mm calcification in the left abdomen likely relates to a left-sided renal calculus.  It is difficult to tell the exact  location of this calcification.  Tiny calculi are present in the lower pole of the right kidney.  No abnormal calcifications are seen along the expected course of the ureters or overlying the bladder.  Bowel gas pattern is unremarkable.  IMPRESSION: 8 mm triangular shaped calcification in the left abdomen likely relates to a left renal calculus.  Tiny calculi project over the lower pole of the right kidney.  Provider: Briscoe Burns  No results found for this or any previous visit.  No results found for this or any previous visit.  No results found for this or any previous visit.  Results for orders placed during the hospital encounter of 05/24/20  US RENAL  Narrative CLINICAL DATA:  Proteinuria  EXAM: RENAL / URINARY TRACT ULTRASOUND COMPLETE  COMPARISON:  CT abdomen 03/21/2019  FINDINGS: Right Kidney:  Renal measurements: 12 x 5.2 x 5.8 cm = volume: 189 mL. Echogenicity within normal limits. No mass or hydronephrosis visualized.  Left Kidney:  Renal measurements: 10.5 x 4.9 x 5 cm = volume: 136 mL. Mild renal cortical thinning. 12 mm nonobstructing left renal calculus. Mild increased echogenicity. No mass or hydronephrosis visualized.  Bladder:  Appears normal for degree of bladder distention.  Other:  None.  IMPRESSION: 1. No obstructive uropathy. 2. A 12 mm nonobstructing left renal calculus. 3. Atrophic  left kidney.   Electronically Signed By: Kathreen Devoid On: 05/24/2020 13:59  No results found for this or any previous visit.  No results found for this or any previous visit.  Results for orders placed in visit on 08/21/20  CT RENAL STONE STUDY  Narrative CLINICAL DATA:  Left flank pain for 3 months. Hematuria. Nephrolithiasis.  EXAM: CT ABDOMEN AND PELVIS WITHOUT CONTRAST  TECHNIQUE: Multidetector CT imaging of the abdomen and pelvis was performed following the standard protocol without IV contrast.  COMPARISON:  03/21/2019  FINDINGS: Lower chest: No acute findings.  Hepatobiliary: No mass visualized on this unenhanced exam. Prior cholecystectomy. No evidence of biliary obstruction.  Pancreas: No mass or inflammatory process visualized on this unenhanced exam.  Spleen:  Within normal limits in size.  Adrenals/Urinary tract: Moderate diffuse left renal parenchymal atrophy and scarring is again seen. A few renal calculi are seen bilaterally, largest in the lower pole of the left kidney measuring 8 mm. No evidence of ureteral calculi or hydronephrosis. Unremarkable unopacified urinary bladder.  Stomach/Bowel: No evidence of obstruction, inflammatory process, or abnormal fluid collections. Normal appendix visualized.  Vascular/Lymphatic: No pathologically enlarged lymph nodes identified. No evidence of abdominal aortic aneurysm.  Reproductive:  No mass or other significant abnormality.  Other:  None.  Musculoskeletal:  No suspicious bone lesions identified.  IMPRESSION: Bilateral nephrolithiasis. No evidence of ureteral calculi, hydronephrosis, or other acute findings.  Chronic diffuse left renal parenchymal atrophy and scarring.   Electronically Signed By: Marlaine Hind M.D. On: 08/21/2020 16:45   Assessment & Plan:    1. Nephrolithiasis -We discussed the management of kidney stones. These options include observation, ureteroscopy, shockwave  lithotripsy (ESWL) and percutaneous nephrolithotomy (PCNL). We discussed which options are relevant to the patient's stone(s). We discussed the natural history of kidney stones as well as the complications of untreated stones and the impact on quality of life without treatment as well as with each of the above listed treatments. We also discussed the efficacy of each treatment in its ability to clear the stone burden. With any of these management options I discussed the signs and symptoms of infection and the need  for emergent treatment should these be experienced. For each option we discussed the ability of each procedure to clear the patient of their stone burden.   For observation I described the risks which include but are not limited to silent renal damage, life-threatening infection, need for emergent surgery, failure to pass stone and pain.   For ureteroscopy I described the risks which include bleeding, infection, damage to contiguous structures, positioning injury, ureteral stricture, ureteral avulsion, ureteral injury, need for prolonged ureteral stent, inability to perform ureteroscopy, need for an interval procedure, inability to clear stone burden, stent discomfort/pain, heart attack, stroke, pulmonary embolus and the inherent risks with general anesthesia.   For shockwave lithotripsy I described the risks which include arrhythmia, kidney contusion, kidney hemorrhage, need for transfusion, pain, inability to adequately break up stone, inability to pass stone fragments, Steinstrasse, infection associated with obstructing stones, need for alternate surgical procedure, need for repeat shockwave lithotripsy, MI, CVA, PE and the inherent risks with anesthesia/conscious sedation.   For PCNL I described the risks including positioning injury, pneumothorax, hydrothorax, need for chest tube, inability to clear stone burden, renal laceration, arterial venous fistula or malformation, need for embolization  of kidney, loss of kidney or renal function, need for repeat procedure, need for prolonged nephrostomy tube, ureteral avulsion, MI, CVA, PE and the inherent risks of general anesthesia.   - The patient would like to proceed with left ureteroscopic stone extraction - Urinalysis, Routine w reflex microscopic  2. Microhematuria -likely related to large left renal calculus - Urine Culture   No follow-ups on file.  Nicolette Bang, MD  [facility] [place]

## 2020-08-29 NOTE — Patient Instructions (Signed)
Ureteroscopy Ureteroscopy is a procedure to check for and treat problems inside part of the urinary tract. In this procedure, a thin, flexible tube with a light at the end (ureteroscope) is used to look at the inside of the kidneys and the ureters. The ureters are the tubes that carry urine from the kidneys to the bladder. The ureteroscope is inserted into one or both of the ureters. You may need this procedure if you have frequent urinary tract infections (UTIs), blood in your urine, or a stone in one of your ureters. A ureteroscopy can be done:  To find the cause of urine blockage in a ureter and to evaluate other abnormalities inside the ureters or kidneys.  To remove stones.  To remove or treat growths of tissue (polyps), abnormal tissue, and some types of tumors.  To remove a tissue sample and check it for disease under a microscope (biopsy). Tell a health care provider about:  Any allergies you have.  All medicines you are taking, including vitamins, herbs, eye drops, creams, and over-the-counter medicines.  Any problems you or family members have had with anesthetic medicines.  Any blood disorders you have.  Any surgeries you have had.  Any medical conditions you have.  Whether you are pregnant or may be pregnant. What are the risks? Generally, this is a safe procedure. However, problems may occur, including:  Bleeding.  Infection.  Allergic reactions to medicines.  Scarring that narrows the ureter (stricture).  Creating a hole in the ureter (perforation). What happens before the procedure? Staying hydrated Follow instructions from your health care provider about hydration, which may include:  Up to 2 hours before the procedure - you may continue to drink clear liquids, such as water, clear fruit juice, black coffee, and plain tea.   Eating and drinking restrictions Follow instructions from your health care provider about eating and drinking, which may include:  8  hours before the procedure - stop eating heavy meals or foods, such as meat, fried foods, or fatty foods.  6 hours before the procedure - stop eating light meals or foods, such as toast or cereal.  6 hours before the procedure - stop drinking milk or drinks that contain milk.  2 hours before the procedure - stop drinking clear liquids. Medicines Ask your health care provider about:  Changing or stopping your regular medicines. This is especially important if you are taking diabetes medicines or blood thinners.  Taking medicines such as aspirin and ibuprofen. These medicines can thin your blood. Do not take these medicines unless your health care provider tells you to take them.  Taking over-the-counter medicines, vitamins, herbs, and supplements. General instructions  Do not use any products that contain nicotine or tobacco for at least 4 weeks before the procedure. These products include cigarettes, e-cigarettes, and chewing tobacco. If you need help quitting, ask your health care provider.  You may have a urine sample taken to check for infection.  Plan to have someone take you home from the hospital or clinic.  If you will be going home right after the procedure, plan to have someone with you for 24 hours.  Ask your health care provider what steps will be taken to help prevent infection. These may include: ? Washing skin with a germ-killing soap. ? Receiving antibiotic medicine. What happens during the procedure?  An IV will be inserted into one of your veins.  You will be given one or more of the following: ? A medicine to help  you relax (sedative). ? A medicine to make you fall asleep (general anesthetic). ? A medicine that is injected into your spine to numb the area below and slightly above the injection site (spinal anesthetic).  The part of your body that drains urine from your bladder (urethra) will be cleaned with a germ-killing solution.  The ureteroscope will be  passed through your urethra into your bladder.  A salt-water solution will be sent through the ureteroscope to fill your bladder. This will help the health care provider see the openings of your ureters more clearly.  The ureteroscope will be passed into your ureter. ? If a growth is found, a biopsy may be done. ? If a stone is found, it may be removed through the ureteroscope, or the stone may be broken up using a laser, shock waves, or electrical energy. ? In some cases, if the ureter is too small, a tube may be inserted that keeps the ureter open (ureteral stent). The stent may be left in place for 1 or 2 weeks to keep the ureter open, and then the ureteroscopy procedure will be done.  The scope will be removed, and your bladder will be emptied. The procedure may vary among health care providers and hospitals.   What can I expect after the procedure? After your procedure, it is common to have:  Your blood pressure, heart rate, breathing rate, and blood oxygen level monitored until you leave the hospital or clinic.  A burning sensation when you urinate. You may be asked to urinate.  Blood in your urine.  Mild discomfort in your bladder area or kidney area when urinating.  A need to urinate more often or urgently. Follow these instructions at home: Medicines  Take over-the-counter and prescription medicines only as told by your health care provider.  If you were prescribed an antibiotic medicine, take it as told by your health care provider. Do not stop taking the antibiotic even if you start to feel better. General instructions  If you were given a sedative during the procedure, it can affect you for several hours. Do not drive or operate machinery until your health care provider says that it is safe.  To relieve burning, take a warm bath or hold a warm washcloth over your groin.  Drink enough fluid to keep your urine pale yellow. ? Drink two 8-ounce (237 mL) glasses of water  every hour for the first 2 hours after you get home. ? Continue to drink water often at home.  You can eat what you normally do.  Keep all follow-up visits as told by your health care provider. This is important. ? If you had a ureteral stent placed, ask your health care provider when you need to return to have it removed.   Contact a health care provider if you have:  Chills or a fever.  Burning pain for longer than 24 hours after the procedure.  Blood in your urine for longer than 24 hours after the procedure. Get help right away if you have:  Large amounts of blood in your urine.  Blood clots in your urine.  Severe pain.  Chest pain or trouble breathing.  The feeling of a full bladder and you are unable to urinate. These symptoms may represent a serious problem that is an emergency. Do not wait to see if the symptoms will go away. Get medical help right away. Call your local emergency services (911 in the [place] Summary  Ureteroscopy is a procedure to  check for and treat problems inside part of the urinary tract.  In this procedure, a thin, flexible tube with a light at the end (ureteroscope) is used to look at the inside of the kidneys and the ureters.  You may need this procedure if you have frequent urinary tract infections (UTIs), blood in your urine, or a stone in a ureter. This information is not intended to replace advice given to you by your health care provider. Make sure you discuss any questions you have with your health care provider. Document Revised: 02/23/2019 Document Reviewed: 02/23/2019 Elsevier Patient Education  [place].

## 2020-08-29 NOTE — Progress Notes (Signed)
Urological Symptom Review  Patient is experiencing the following symptoms: Hard to postpone urination Get up at night to urinate Urinary tract infection   Review of Systems  Gastrointestinal (upper)  : Negative for upper GI symptoms  Gastrointestinal (lower) : Negative for lower GI symptoms  Constitutional : Night Sweats Fatigue  Skin: Negative for skin symptoms  Eyes: Negative for eye symptoms  Ear/Nose/Throat : Sinus problems  Hematologic/Lymphatic: Negative for Hematologic/Lymphatic symptoms  Cardiovascular : Negative for cardiovascular symptoms  Respiratory : Shortness of breath  Endocrine: Negative for endocrine symptoms  Musculoskeletal: Negative for musculoskeletal symptoms  Neurological: Negative for neurological symptoms  Psychologic: De[ression/Anxiety

## 2020-08-30 ENCOUNTER — Encounter: Payer: Self-pay | Admitting: Urology

## 2020-08-31 MED ORDER — KETOROLAC TROMETHAMINE 10 MG PO TABS: 10.0000 mg | ORAL_TABLET | Freq: Four times a day (QID) | ORAL | 0 refills | Status: DC | PRN

## 2020-09-01 LAB — URINE CULTURE

## 2020-09-03 ENCOUNTER — Telehealth: Payer: Self-pay

## 2020-09-03 NOTE — Telephone Encounter (Signed)
Patient called to give surgery date of 10/04/2020.   Pt also reported pain medication was not helping. Message sent to MD

## 2020-09-04 ENCOUNTER — Other Ambulatory Visit: Payer: Self-pay | Admitting: Urology

## 2020-09-04 ENCOUNTER — Other Ambulatory Visit: Payer: Self-pay

## 2020-09-04 ENCOUNTER — Ambulatory Visit (INDEPENDENT_AMBULATORY_CARE_PROVIDER_SITE_OTHER): Payer: Medicare Other | Admitting: Obstetrics & Gynecology

## 2020-09-04 ENCOUNTER — Other Ambulatory Visit: Payer: Self-pay | Admitting: Obstetrics & Gynecology

## 2020-09-04 ENCOUNTER — Encounter: Payer: Self-pay | Admitting: Obstetrics & Gynecology

## 2020-09-04 VITALS — BP 163/111 | HR 119 | Ht 62.0 in | Wt 221.5 lb

## 2020-09-04 DIAGNOSIS — N870 Mild cervical dysplasia: Secondary | ICD-10-CM | POA: Diagnosis not present

## 2020-09-04 DIAGNOSIS — R87612 Low grade squamous intraepithelial lesion on cytologic smear of cervix (LGSIL): Secondary | ICD-10-CM

## 2020-09-04 MED ORDER — NITROFURANTOIN MONOHYD MACRO 100 MG PO CAPS: 100.0000 mg | ORAL_CAPSULE | Freq: Two times a day (BID) | ORAL | 0 refills | Status: DC

## 2020-09-04 MED ORDER — OXYCODONE-ACETAMINOPHEN 10-325 MG PO TABS: 1.0000 | ORAL_TABLET | ORAL | 0 refills | Status: DC | PRN

## 2020-09-04 MED ORDER — NITROFURANTOIN MACROCRYSTAL 50 MG PO CAPS: 50.0000 mg | ORAL_CAPSULE | Freq: Every day | ORAL | 0 refills | Status: DC

## 2020-09-04 NOTE — Telephone Encounter (Signed)
Reviewed urine culture with Dr. Alyson Ingles-   Orders were placed for antibiotics. Reviewed with patient on how to take. Pt voiced understanding she will take BID and then start nightly until after her surgery.  Patient also aware of pain medication sent to pharmacy.

## 2020-09-04 NOTE — Progress Notes (Signed)
    Colposcopy Procedure Note:    Colposcopy Procedure Note  Indications:  3/22  LSIL/+HR HPV/negative 16&18   2019 ASCCP recommendation: IR 4.3% CINIII  Smoker:  No. New sexual partner:  No.  : time frame:  No.  History of abnormal Pap: no  Procedure Details  The risks and benefits of the procedure and Written informed consent obtained.  Speculum placed in vagina and excellent visualization of cervix achieved, cervix swabbed x 3 with acetic acid solution.  Findings: Adequate colposcopy is noted today.  Cervix: Dense AWE without punctation mosaicism or abnormal vessesl; biospy taken at 9 o'clock in area of AWE changes, probably HPV atypia. Vaginal inspection: vaginal colposcopy not performed. Vulvar colposcopy: vulvar colposcopy not performed.  Specimens: x 1  Complications: none.  Colposcopic Impression: No concern for HSIL Will send my chart message with biopsy report  Plan(Based on 2019 ASCCP recommendations)

## 2020-09-04 NOTE — Telephone Encounter (Signed)
I changed her rx and sent it to pharmacy

## 2020-09-12 ENCOUNTER — Telehealth: Payer: Self-pay | Admitting: Obstetrics & Gynecology

## 2020-09-12 NOTE — Telephone Encounter (Signed)
Estill Bamberg, I am informing Tammy of this patient so she can schedule her surgery during Daisy's absence.  Inform pt we wil lcontact her with the details,    Thank you,  Dr Rea College,  Please schedule [name] for surgery and contact her regarding pre op dates/times, etc::  Procedure:  Laser ablation of the cervix  Diagnosis:  Suspected high grade dysplasia of the cervix  Date or dates requested:  10/31/20 [facility]  Thanks,  Dr Elonda Husky

## 2020-09-12 NOTE — Telephone Encounter (Signed)
Patient wants a returned phone call to discuss the Colposcopy procedure. Clinical staff will follow up with patient.

## 2020-09-12 NOTE — Telephone Encounter (Signed)
Pt couldn't figure out how to respond to Dr. Elonda Husky. She wanted to let him know she would like to proceed with a laser procedure.

## 2020-09-25 DIAGNOSIS — Z23 Encounter for immunization: Secondary | ICD-10-CM | POA: Diagnosis not present

## 2020-09-28 NOTE — Patient Instructions (Signed)
[name]  09/28/2020     @PREFPERIOPPHARMACY @   Your procedure is scheduled on  10/04/2020   Report to [facility] at  [email or web address].   Call this number if you have problems the morning of surgery:  [phone]   Remember:  Do not eat or drink after midnight.                         Take these medicines the morning of surgery with A SIP OF WATER  Xanax( if needed), amlodipine, carvedilol, catapress, toradol, lamictal, NP thyroid, oxycodone (If needed).    Place clean sheets on your bed the night before your procedure and DO NOT sleep with pets this night.  Shower with CHG the night before and the morning of your procedure. DO NOT put CHG on your face, hair or genitals.  After each shower, dry off with a clean towel, pit on clean, comfortable clothes and brush your teeth.      Do not wear jewelry, make-up or nail polish.  Do not wear lotions, powders, or perfumes, or deodorant.  Do not shave 48 hours prior to surgery.  Men may shave face and neck.  Do not bring valuables to the hospital.  [facility] is not responsible for any belongings or valuables.  Contacts, dentures or bridgework may not be worn into surgery.  Leave your suitcase in the car.  After surgery it may be brought to your room.  For patients admitted to the hospital, discharge time will be determined by your treatment team.  Patients discharged the day of surgery will not be allowed to drive home and must have someone with them for 24 hours.   Special instructions:  DO NOT smoke tobacco or vape for 24 hours before your procedure.  Please read over the following fact sheets that you were given. Coughing and Deep Breathing, Surgical Site Infection Prevention, Anesthesia Post-op Instructions and Care and Recovery After Surgery       Ureteral Stent Implantation, Care After This sheet gives you information about how to care for yourself after your procedure. Your health care provider may also  give you more specific instructions. If you have problems or questions, contact your health care provider. What can I expect after the procedure? After the procedure, it is common to have:  Nausea.  Mild pain when you urinate. You may feel this pain in your lower back or lower abdomen. The pain should stop within a few minutes after you urinate. This may last for up to 1 week.  A small amount of blood in your urine for several days. Follow these instructions at home: Medicines  Take over-the-counter and prescription medicines only as told by your health care provider.  If you were prescribed an antibiotic medicine, take it as told by your health care provider. Do not stop taking the antibiotic even if you start to feel better.  Do not drive for 24 hours if you were given a sedative during your procedure.  Ask your health care provider if the medicine prescribed to you requires you to avoid driving or using heavy machinery. Activity  Rest as told by your health care provider.  Avoid sitting for a long time without moving. Get up to take short walks every 1-2 hours. This is important to improve blood flow and breathing. Ask for help if you feel weak or unsteady.  Return to your  normal activities as told by your health care provider. Ask your health care provider what activities are safe for you. General instructions  Watch for any blood in your urine. Call your health care provider if the amount of blood in your urine increases.  If you have a catheter: ? Follow instructions from your health care provider about taking care of your catheter and collection bag. ? Do not take baths, swim, or use a hot tub until your health care provider approves. Ask your health care provider if you may take showers. You may only be allowed to take sponge baths.  Drink enough fluid to keep your urine pale yellow.  Do not use any products that contain nicotine or tobacco, such as cigarettes,  e-cigarettes, and chewing tobacco. These can delay healing after surgery. If you need help quitting, ask your health care provider.  Keep all follow-up visits as told by your health care provider. This is important.   Contact a health care provider if:  You have pain that gets worse or does not get better with medicine, especially pain when you urinate.  You have difficulty urinating.  You feel nauseous or you vomit repeatedly during a period of more than 2 days after the procedure. Get help right away if:  Your urine is dark red or has blood clots in it.  You are leaking urine (have incontinence).  The end of the stent comes out of your urethra.  You cannot urinate.  You have sudden, sharp, or severe pain in your abdomen or lower back.  You have a fever.  You have swelling or pain in your legs.  You have difficulty breathing. Summary  After the procedure, it is common to have mild pain when you urinate that goes away within a few minutes after you urinate. This may last for up to 1 week.  Watch for any blood in your urine. Call your health care provider if the amount of blood in your urine increases.  Take over-the-counter and prescription medicines only as told by your health care provider.  Drink enough fluid to keep your urine pale yellow. This information is not intended to replace advice given to you by your health care provider. Make sure you discuss any questions you have with your health care provider. Document Revised: 02/23/2018 Document Reviewed: 02/24/2018 Elsevier Patient Education  2021 [employer] [facility], Adult, Care After This sheet gives you information about how to care for yourself after your procedure. Your health care provider may also give you more specific instructions. If you have problems or questions, contact your health care provider. What can I expect after the procedure? After the procedure, the following side effects are  common:  Pain or discomfort at the IV site.  Nausea.  Vomiting.  Sore throat.  Trouble concentrating.  Feeling cold or chills.  Feeling weak or tired.  Sleepiness and fatigue.  Soreness and body aches. These side effects can affect parts of the body that were not involved in surgery. Follow these instructions at home: For the time period you were told by your health care provider:  Rest.  Do not participate in activities where you could fall or become injured.  Do not drive or use machinery.  Do not drink alcohol.  Do not take sleeping pills or medicines that cause drowsiness.  Do not make important decisions or sign legal documents.  Do not take care of children on your own.   Eating and drinking  Follow any  instructions from your health care provider about eating or drinking restrictions.  When you feel hungry, start by eating small amounts of foods that are soft and easy to digest (bland), such as toast. Gradually return to your regular diet.  Drink enough fluid to keep your urine pale yellow.  If you vomit, rehydrate by drinking water, juice, or clear broth. General instructions  If you have sleep apnea, surgery and certain medicines can increase your risk for breathing problems. Follow instructions from your health care provider about wearing your sleep device: ? Anytime you are sleeping, including during daytime naps. ? While taking prescription pain medicines, sleeping medicines, or medicines that make you drowsy.  Have a responsible adult stay with you for the time you are told. It is important to have someone help care for you until you are awake and alert.  Return to your normal activities as told by your health care provider. Ask your health care provider what activities are safe for you.  Take over-the-counter and prescription medicines only as told by your health care provider.  If you smoke, do not smoke without supervision.  Keep all follow-up  visits as told by your health care provider. This is important. Contact a health care provider if:  You have nausea or vomiting that does not get better with medicine.  You cannot eat or drink without vomiting.  You have pain that does not get better with medicine.  You are unable to pass urine.  You develop a skin rash.  You have a fever.  You have redness around your IV site that gets worse. Get help right away if:  You have difficulty breathing.  You have chest pain.  You have blood in your urine or stool, or you vomit blood. Summary  After the procedure, it is common to have a sore throat or nausea. It is also common to feel tired.  Have a responsible adult stay with you for the time you are told. It is important to have someone help care for you until you are awake and alert.  When you feel hungry, start by eating small amounts of foods that are soft and easy to digest (bland), such as toast. Gradually return to your regular diet.  Drink enough fluid to keep your urine pale yellow.  Return to your normal activities as told by your health care provider. Ask your health care provider what activities are safe for you. This information is not intended to replace advice given to you by your health care provider. Make sure you discuss any questions you have with your health care provider. Document Revised: 02/02/2020 Document Reviewed: 09/01/2019 Elsevier Patient Education  2021 [employer].

## 2020-10-02 ENCOUNTER — Encounter (HOSPITAL_COMMUNITY)
Admission: RE | Admit: 2020-10-02 | Discharge: 2020-10-02 | Disposition: A | Discharge status: Home / Self Care | Payer: Medicare Other | Source: Ambulatory Visit | Attending: Urology | Admitting: Urology

## 2020-10-02 ENCOUNTER — Other Ambulatory Visit: Payer: Self-pay

## 2020-10-02 ENCOUNTER — Other Ambulatory Visit (HOSPITAL_COMMUNITY)
Admission: RE | Admit: 2020-10-02 | Discharge: 2020-10-02 | Disposition: A | Discharge status: Home / Self Care | Payer: Medicare Other | Source: Ambulatory Visit | Attending: Urology | Admitting: Urology

## 2020-10-02 ENCOUNTER — Encounter (HOSPITAL_COMMUNITY): Payer: Self-pay

## 2020-10-02 DIAGNOSIS — Z01818 Encounter for other preprocedural examination: Secondary | ICD-10-CM | POA: Insufficient documentation

## 2020-10-02 DIAGNOSIS — Z20822 Contact with and (suspected) exposure to covid-19: Secondary | ICD-10-CM | POA: Insufficient documentation

## 2020-10-02 LAB — CBC WITH DIFFERENTIAL/PLATELET
Abs Immature Granulocytes: 0.03 10*3/uL (ref 0.00–0.07)
Basophils Absolute: 0.1 10*3/uL (ref 0.0–0.1)
Basophils Relative: 1 %
Eosinophils Absolute: 0.3 10*3/uL (ref 0.0–0.5)
Eosinophils Relative: 3 %
HCT: 47.5 % — ABNORMAL HIGH (ref 36.0–46.0)
Hemoglobin: 15.5 g/dL — ABNORMAL HIGH (ref 12.0–15.0)
Immature Granulocytes: 0 %
Lymphocytes Relative: 33 %
Lymphs Abs: 2.9 10*3/uL (ref 0.7–4.0)
MCH: 28.9 pg (ref 26.0–34.0)
MCHC: 32.6 g/dL (ref 30.0–36.0)
MCV: 88.5 fL (ref 80.0–100.0)
Monocytes Absolute: 0.7 10*3/uL (ref 0.1–1.0)
Monocytes Relative: 8 %
Neutro Abs: 4.8 10*3/uL (ref 1.7–7.7)
Neutrophils Relative %: 55 %
Platelets: 339 10*3/uL (ref 150–400)
RBC: 5.37 MIL/uL — ABNORMAL HIGH (ref 3.87–5.11)
RDW: 15.1 % (ref 11.5–15.5)
WBC: 8.8 10*3/uL (ref 4.0–10.5)
nRBC: 0 % (ref 0.0–0.2)

## 2020-10-02 LAB — HCG, SERUM, QUALITATIVE: Preg, Serum: NEGATIVE

## 2020-10-02 LAB — BASIC METABOLIC PANEL
Anion gap: 9 (ref 5–15)
BUN: 20 mg/dL (ref 6–20)
CO2: 21 mmol/L — ABNORMAL LOW (ref 22–32)
Calcium: 9.2 mg/dL (ref 8.9–10.3)
Chloride: 105 mmol/L (ref 98–111)
Creatinine, Ser: 1.33 mg/dL — ABNORMAL HIGH (ref 0.44–1.00)
GFR, Estimated: 49 mL/min — ABNORMAL LOW (ref >60)
Glucose, Bld: 106 mg/dL — ABNORMAL HIGH (ref 70–99)
Potassium: 4.7 mmol/L (ref 3.5–5.1)
Sodium: 135 mmol/L (ref 135–145)

## 2020-10-02 LAB — SARS CORONAVIRUS 2 (TAT 6-24 HRS): SARS Coronavirus 2: NEGATIVE

## 2020-10-04 ENCOUNTER — Ambulatory Visit (HOSPITAL_COMMUNITY): Payer: Medicare Other | Admitting: Anesthesiology

## 2020-10-04 ENCOUNTER — Encounter (HOSPITAL_COMMUNITY)
Admission: RE | Disposition: A | Discharge status: Home / Self Care | Payer: Self-pay | Source: Home / Self Care | Attending: Urology

## 2020-10-04 ENCOUNTER — Other Ambulatory Visit: Payer: Self-pay

## 2020-10-04 ENCOUNTER — Ambulatory Visit (HOSPITAL_COMMUNITY): Payer: Medicare Other

## 2020-10-04 ENCOUNTER — Ambulatory Visit (HOSPITAL_COMMUNITY)
Admission: RE | Admit: 2020-10-04 | Discharge: 2020-10-04 | Disposition: A | Discharge status: Home / Self Care | Payer: Medicare Other | Attending: Urology | Admitting: Urology

## 2020-10-04 ENCOUNTER — Encounter (HOSPITAL_COMMUNITY): Payer: Self-pay | Admitting: Urology

## 2020-10-04 DIAGNOSIS — N200 Calculus of kidney: Secondary | ICD-10-CM | POA: Diagnosis not present

## 2020-10-04 DIAGNOSIS — E039 Hypothyroidism, unspecified: Secondary | ICD-10-CM | POA: Diagnosis not present

## 2020-10-04 DIAGNOSIS — Z9049 Acquired absence of other specified parts of digestive tract: Secondary | ICD-10-CM | POA: Diagnosis not present

## 2020-10-04 DIAGNOSIS — N201 Calculus of ureter: Secondary | ICD-10-CM | POA: Diagnosis not present

## 2020-10-04 DIAGNOSIS — Z885 Allergy status to narcotic agent status: Secondary | ICD-10-CM | POA: Insufficient documentation

## 2020-10-04 DIAGNOSIS — Z888 Allergy status to other drugs, medicaments and biological substances status: Secondary | ICD-10-CM | POA: Insufficient documentation

## 2020-10-04 DIAGNOSIS — Z87442 Personal history of urinary calculi: Secondary | ICD-10-CM | POA: Insufficient documentation

## 2020-10-04 DIAGNOSIS — N202 Calculus of kidney with calculus of ureter: Secondary | ICD-10-CM | POA: Diagnosis present

## 2020-10-04 DIAGNOSIS — Z882 Allergy status to sulfonamides status: Secondary | ICD-10-CM | POA: Insufficient documentation

## 2020-10-04 DIAGNOSIS — R109 Unspecified abdominal pain: Secondary | ICD-10-CM | POA: Diagnosis not present

## 2020-10-04 HISTORY — PX: CYSTOSCOPY WITH RETROGRADE PYELOGRAM, URETEROSCOPY AND STENT PLACEMENT: SHX5789

## 2020-10-04 SURGERY — CYSTOURETEROSCOPY, WITH RETROGRADE PYELOGRAM AND STENT INSERTION
Anesthesia: General | Laterality: Left

## 2020-10-04 MED ORDER — ONDANSETRON HCL 4 MG/2ML IJ SOLN: 4.0000 mg | Freq: Once | INTRAMUSCULAR | Status: DC | PRN

## 2020-10-04 MED ORDER — WATER FOR IRRIGATION, STERILE IR SOLN
Status: DC | PRN
  Administered 2020-10-04: 500 mL

## 2020-10-04 MED ORDER — MIDAZOLAM HCL 2 MG/2ML IJ SOLN
INTRAMUSCULAR | Status: AC
  Filled 2020-10-04: qty 2

## 2020-10-04 MED ORDER — PROPOFOL 10 MG/ML IV BOLUS
INTRAVENOUS | Status: AC
  Filled 2020-10-04: qty 20

## 2020-10-04 MED ORDER — FENTANYL CITRATE (PF) 100 MCG/2ML IJ SOLN
25.0000 ug | INTRAMUSCULAR | Status: DC | PRN
  Administered 2020-10-04 (×3): 50 ug via INTRAVENOUS
  Filled 2020-10-04: qty 2

## 2020-10-04 MED ORDER — FENTANYL CITRATE (PF) 250 MCG/5ML IJ SOLN
INTRAMUSCULAR | Status: DC | PRN
  Administered 2020-10-04 (×2): 50 ug via INTRAVENOUS

## 2020-10-04 MED ORDER — OXYCODONE-ACETAMINOPHEN 10-325 MG PO TABS: 1.0000 | ORAL_TABLET | ORAL | 0 refills | Status: DC | PRN

## 2020-10-04 MED ORDER — HYDROMORPHONE HCL 1 MG/ML IJ SOLN
0.5000 mg | INTRAMUSCULAR | Status: AC
  Administered 2020-10-04 (×4): 0.5 mg via INTRAVENOUS
  Filled 2020-10-04 (×2): qty 0.5

## 2020-10-04 MED ORDER — EPHEDRINE 5 MG/ML INJ
INTRAVENOUS | Status: AC
  Filled 2020-10-04: qty 10

## 2020-10-04 MED ORDER — ONDANSETRON HCL 4 MG/2ML IJ SOLN
INTRAMUSCULAR | Status: DC | PRN
  Administered 2020-10-04: 4 mg via INTRAVENOUS

## 2020-10-04 MED ORDER — DIATRIZOATE MEGLUMINE 30 % UR SOLN
URETHRAL | Status: DC | PRN
  Administered 2020-10-04: 6 mL via URETHRAL

## 2020-10-04 MED ORDER — MIDAZOLAM HCL 2 MG/2ML IJ SOLN
INTRAMUSCULAR | Status: DC | PRN
  Administered 2020-10-04: 2 mg via INTRAVENOUS

## 2020-10-04 MED ORDER — FENTANYL CITRATE (PF) 100 MCG/2ML IJ SOLN
50.0000 ug | Freq: Once | INTRAMUSCULAR | Status: AC
Start: 2020-10-04 — End: 2020-10-04

## 2020-10-04 MED ORDER — HYDROMORPHONE HCL 1 MG/ML IJ SOLN
INTRAMUSCULAR | Status: AC
  Filled 2020-10-04: qty 0.5

## 2020-10-04 MED ORDER — ORAL CARE MOUTH RINSE: 15.0000 mL | Freq: Once | OROMUCOSAL | Status: AC

## 2020-10-04 MED ORDER — SODIUM CHLORIDE 0.9 % IR SOLN
Status: DC | PRN
  Administered 2020-10-04 (×2): 3000 mL

## 2020-10-04 MED ORDER — FENTANYL CITRATE (PF) 100 MCG/2ML IJ SOLN
INTRAMUSCULAR | Status: AC
  Filled 2020-10-04: qty 2

## 2020-10-04 MED ORDER — HYDROMORPHONE HCL 1 MG/ML IJ SOLN
INTRAMUSCULAR | Status: DC | PRN
  Administered 2020-10-04 (×3): .5 mg via INTRAVENOUS

## 2020-10-04 MED ORDER — LACTATED RINGERS IV SOLN: INTRAVENOUS | Status: DC

## 2020-10-04 MED ORDER — FENTANYL CITRATE (PF) 100 MCG/2ML IJ SOLN
INTRAMUSCULAR | Status: AC
  Administered 2020-10-04: 50 ug via INTRAVENOUS
  Filled 2020-10-04: qty 2

## 2020-10-04 MED ORDER — HYDROMORPHONE HCL 1 MG/ML IJ SOLN
INTRAMUSCULAR | Status: AC
  Filled 2020-10-04: qty 1

## 2020-10-04 MED ORDER — LIDOCAINE HCL (PF) 2 % IJ SOLN
INTRAMUSCULAR | Status: AC
  Filled 2020-10-04: qty 10

## 2020-10-04 MED ORDER — PROPOFOL 10 MG/ML IV BOLUS
INTRAVENOUS | Status: DC | PRN
  Administered 2020-10-04: 200 mg via INTRAVENOUS

## 2020-10-04 MED ORDER — CEFAZOLIN SODIUM-DEXTROSE 2-4 GM/100ML-% IV SOLN
2.0000 g | INTRAVENOUS | Status: AC
  Administered 2020-10-04: 2 g via INTRAVENOUS
  Filled 2020-10-04: qty 100

## 2020-10-04 MED ORDER — DIATRIZOATE MEGLUMINE 30 % UR SOLN
URETHRAL | Status: AC
  Filled 2020-10-04: qty 100

## 2020-10-04 MED ORDER — DEXAMETHASONE SODIUM PHOSPHATE 10 MG/ML IJ SOLN
INTRAMUSCULAR | Status: DC | PRN
  Administered 2020-10-04: 10 mg via INTRAVENOUS

## 2020-10-04 MED ORDER — LIDOCAINE HCL (CARDIAC) PF 100 MG/5ML IV SOSY
PREFILLED_SYRINGE | INTRAVENOUS | Status: DC | PRN
  Administered 2020-10-04: 60 mg via INTRAVENOUS

## 2020-10-04 MED ORDER — CHLORHEXIDINE GLUCONATE 0.12 % MT SOLN
15.0000 mL | Freq: Once | OROMUCOSAL | Status: AC
  Administered 2020-10-04: 15 mL via OROMUCOSAL
  Filled 2020-10-04: qty 15

## 2020-10-04 MED ORDER — FENTANYL CITRATE (PF) 100 MCG/2ML IJ SOLN
50.0000 ug | Freq: Once | INTRAMUSCULAR | Status: AC
  Administered 2020-10-04: 50 ug via INTRAVENOUS

## 2020-10-04 SURGICAL SUPPLY — 25 items
BAG DRAIN URO TABLE W/ADPT NS (BAG) ×2 IMPLANT
BAG DRN 8 ADPR NS SKTRN CSTL (BAG) ×1
BAG HAMPER (MISCELLANEOUS) ×2 IMPLANT
CATH INTERMIT  6FR 70CM (CATHETERS) ×2 IMPLANT
CLOTH BEACON ORANGE TIMEOUT ST (SAFETY) ×2 IMPLANT
DECANTER SPIKE VIAL GLASS SM (MISCELLANEOUS) ×2 IMPLANT
GLOVE BIO SURGEON STRL SZ8 (GLOVE) ×2 IMPLANT
GLOVE SURG UNDER POLY LF SZ7 (GLOVE) ×4 IMPLANT
GOWN STRL REUS W/TWL LRG LVL3 (GOWN DISPOSABLE) ×2 IMPLANT
GOWN STRL REUS W/TWL XL LVL3 (GOWN DISPOSABLE) ×2 IMPLANT
GUIDEWIRE STR DUAL SENSOR (WIRE) ×4 IMPLANT
GUIDEWIRE STR ZIPWIRE 035X150 (MISCELLANEOUS) ×2 IMPLANT
IV NS IRRIG 3000ML ARTHROMATIC (IV SOLUTION) ×4 IMPLANT
KIT TURNOVER CYSTO (KITS) ×2 IMPLANT
MANIFOLD NEPTUNE II (INSTRUMENTS) ×2 IMPLANT
PACK CYSTO (CUSTOM PROCEDURE TRAY) ×2 IMPLANT
PAD ARMBOARD 7.5X6 YLW CONV (MISCELLANEOUS) ×2 IMPLANT
SHEATH ACCESS URETERAL 24CM (SHEATH) ×2 IMPLANT
STENT URET 6FRX24 CONTOUR (STENTS) ×2 IMPLANT
SYR 10ML LL (SYRINGE) ×2 IMPLANT
SYR CONTROL 10ML LL (SYRINGE) ×2 IMPLANT
TOWEL OR 17X26 4PK STRL BLUE (TOWEL DISPOSABLE) ×2 IMPLANT
TRACTIP FLEXIVA PULS ID 200XHI (Laser) ×1 IMPLANT
TRACTIP FLEXIVA PULSE ID 200 (Laser) ×2
WATER STERILE IRR 500ML POUR (IV SOLUTION) ×2 IMPLANT

## 2020-10-04 NOTE — Discharge Instructions (Signed)
Ureteral Stent Implantation, Care After This sheet gives you information about how to care for yourself after your procedure. Your health care provider may also give you more specific instructions. If you have problems or questions, contact your health care provider. What can I expect after the procedure? After the procedure, it is common to have:  Nausea.  Mild pain when you urinate. You may feel this pain in your lower back or lower abdomen. The pain should stop within a few minutes after you urinate. This may last for up to 1 week.  A small amount of blood in your urine for several days. Follow these instructions at home: Medicines  Take over-the-counter and prescription medicines only as told by your health care provider.  If you were prescribed an antibiotic medicine, take it as told by your health care provider. Do not stop taking the antibiotic even if you start to feel better.  Do not drive for 24 hours if you were given a sedative during your procedure.  Ask your health care provider if the medicine prescribed to you requires you to avoid driving or using heavy machinery. Activity  Rest as told by your health care provider.  Avoid sitting for a long time without moving. Get up to take short walks every 1-2 hours. This is important to improve blood flow and breathing. Ask for help if you feel weak or unsteady.  Return to your normal activities as told by your health care provider. Ask your health care provider what activities are safe for you. General instructions  Watch for any blood in your urine. Call your health care provider if the amount of blood in your urine increases.  If you have a catheter: ? Follow instructions from your health care provider about taking care of your catheter and collection bag. ? Do not take baths, swim, or use a hot tub until your health care provider approves. Ask your health care provider if you may take showers. You may only be allowed to  take sponge baths.  Drink enough fluid to keep your urine pale yellow.  Do not use any products that contain nicotine or tobacco, such as cigarettes, e-cigarettes, and chewing tobacco. These can delay healing after surgery. If you need help quitting, ask your health care provider.  Keep all follow-up visits as told by your health care provider. This is important.   Contact a health care provider if:  You have pain that gets worse or does not get better with medicine, especially pain when you urinate.  You have difficulty urinating.  You feel nauseous or you vomit repeatedly during a period of more than 2 days after the procedure. Get help right away if:  Your urine is dark red or has blood clots in it.  You are leaking urine (have incontinence).  The end of the stent comes out of your urethra.  You cannot urinate.  You have sudden, sharp, or severe pain in your abdomen or lower back.  You have a fever.  You have swelling or pain in your legs.  You have difficulty breathing. Summary  After the procedure, it is common to have mild pain when you urinate that goes away within a few minutes after you urinate. This may last for up to 1 week.  Watch for any blood in your urine. Call your health care provider if the amount of blood in your urine increases.  Take over-the-counter and prescription medicines only as told by your health care provider.  Drink enough fluid to keep your urine pale yellow. This information is not intended to replace advice given to you by your health care provider. Make sure you discuss any questions you have with your health care provider. Document Revised: 02/23/2018 Document Reviewed: 02/24/2018 Elsevier Patient Education  2021 [place] Anesthesia, Adult, Care After This sheet gives you information about how to care for yourself after your procedure. Your health care provider may also give you more specific instructions. If you  have problems or questions, contact your health care provider. What can I expect after the procedure? After the procedure, the following side effects are common:  Pain or discomfort at the IV site.  Nausea.  Vomiting.  Sore throat.  Trouble concentrating.  Feeling cold or chills.  Feeling weak or tired.  Sleepiness and fatigue.  Soreness and body aches. These side effects can affect parts of the body that were not involved in surgery. Follow these instructions at home: For the time period you were told by your health care provider:  Rest.  Do not participate in activities where you could fall or become injured.  Do not drive or use machinery.  Do not drink alcohol.  Do not take sleeping pills or medicines that cause drowsiness.  Do not make important decisions or sign legal documents.  Do not take care of children on your own.   Eating and drinking  Follow any instructions from your health care provider about eating or drinking restrictions.  When you feel hungry, start by eating small amounts of foods that are soft and easy to digest (bland), such as toast. Gradually return to your regular diet.  Drink enough fluid to keep your urine pale yellow.  If you vomit, rehydrate by drinking water, juice, or clear broth. General instructions  If you have sleep apnea, surgery and certain medicines can increase your risk for breathing problems. Follow instructions from your health care provider about wearing your sleep device: ? Anytime you are sleeping, including during daytime naps. ? While taking prescription pain medicines, sleeping medicines, or medicines that make you drowsy.  Have a responsible adult stay with you for the time you are told. It is important to have someone help care for you until you are awake and alert.  Return to your normal activities as told by your health care provider. Ask your health care provider what activities are safe for you.  Take  over-the-counter and prescription medicines only as told by your health care provider.  If you smoke, do not smoke without supervision.  Keep all follow-up visits as told by your health care provider. This is important. Contact a health care provider if:  You have nausea or vomiting that does not get better with medicine.  You cannot eat or drink without vomiting.  You have pain that does not get better with medicine.  You are unable to pass urine.  You develop a skin rash.  You have a fever.  You have redness around your IV site that gets worse. Get help right away if:  You have difficulty breathing.  You have chest pain.  You have blood in your urine or stool, or you vomit blood. Summary  After the procedure, it is common to have a sore throat or nausea. It is also common to feel tired.  Have a responsible adult stay with you for the time you are told. It is important to have someone help care for you until  you are awake and alert.  When you feel hungry, start by eating small amounts of foods that are soft and easy to digest (bland), such as toast. Gradually return to your regular diet.  Drink enough fluid to keep your urine pale yellow.  Return to your normal activities as told by your health care provider. Ask your health care provider what activities are safe for you. This information is not intended to replace advice given to you by your health care provider. Make sure you discuss any questions you have with your health care provider. Document Revised: 02/02/2020 Document Reviewed: 09/01/2019 Elsevier Patient Education  2021 [place].    Acetaminophen; Oxycodone tablets What is this medicine? ACETAMINOPHEN; OXYCODONE (a set a MEE noe fen; ox i KOE done) is a pain reliever. It is used to treat moderate to severe pain. This medicine may be used for other purposes; ask your health care provider or pharmacist if you have questions. COMMON BRAND NAME(S): Endocet,  Magnacet, Nalocet, Narvox, Percocet, Perloxx, Primalev, Primlev, Prolate, Roxicet, Xolox What should I tell my health care provider before I take this medicine? They need to know if you have any of these conditions:  brain tumor  drug abuse or addiction  head injury  heart disease  if you often drink alcohol   kidney disease   liver disease  low adrenal gland function  lung disease, asthma, or breathing problem  seizures  stomach or intestine problems  taken an MAOI like Marplan, Nardil, or Parnate in the last 14 days  an unusual or allergic reaction to acetaminophen, oxycodone, other medicines, foods, dyes, or preservative  pregnant or trying to get pregnant  breast-feeding How should I use this medicine? Take this medicine by mouth with a full glass of water. Take it as directed on the label. You can take it with or without food. If it upsets your stomach, take it with food. Do not use it more often than directed. There may be unused or extra doses in the bottle after you finish your treatment. Talk to your health care provider if you have questions about your dose. A special MedGuide will be given to you by the pharmacist with each prescription and refill. Be sure to read this information carefully each time. Talk to your health care provider about the use of this medicine in children. Special care may be needed. Patients over 13 years of age may have a stronger reaction and need a smaller dose. Overdosage: If you think you have taken too much of this medicine contact a poison control center or emergency room at once. NOTE: This medicine is only for you. Do not share this medicine with others. What if I miss a dose? This does not apply. This medicine is not for regular use. It should only be used as needed. What may interact with this medicine? This medicine may interact with the following medications:  alcohol  antihistamines for allergy, cough and  cold  antiviral medicines for HIV or AIDS  atropine  certain antibiotics like clarithromycin, erythromycin, linezolid, rifampin  certain medicines for anxiety or sleep  certain medicines for bladder problems like oxybutynin, tolterodine  certain medicines for depression like amitriptyline, fluoxetine, sertraline  certain medicines for fungal infections like ketoconazole, itraconazole, voriconazole  certain medicines for migraine headache like almotriptan, eletriptan, frovatriptan, naratriptan, rizatriptan, sumatriptan, zolmitriptan  certain medicines for nausea or vomiting like dolasetron, ondansetron, palonosetron  certain medicines for Parkinson's disease like benztropine, trihexyphenidyl  certain medicines for seizures  like phenobarbital, phenytoin, primidone  certain medicines for stomach problems like dicyclomine, hyoscyamine  certain medicines for travel sickness like scopolamine  diuretics  general anesthetics like halothane, isoflurane, methoxyflurane, propofol  ipratropium  local anesthetics like lidocaine, pramoxine, tetracaine  MAOIs like Carbex, Eldepryl, Marplan, Nardil, and Parnate  medicines that relax muscles for surgery  methylene blue  nilotinib  other medicines with acetaminophen  other narcotic medicines for pain or cough  phenothiazines like chlorpromazine, mesoridazine, prochlorperazine, thioridazine This list may not describe all possible interactions. Give your health care provider a list of all the medicines, herbs, non-prescription drugs, or dietary supplements you use. Also tell them if you smoke, drink alcohol, or use illegal drugs. Some items may interact with your medicine. What should I watch for while using this medicine? Tell your health care provider if your pain does not go away, if it gets worse, or if you have new or a different type of pain. You may develop tolerance to this drug. Tolerance means that you will need a higher dose  of the drug for pain relief. Tolerance is normal and is expected if you take this drug for a long time. There are different types of narcotic drugs (opioids) for pain. If you take more than one type at the same time, you may have more side effects. Give your health care provider a list of all drugs you use. He or she will tell you how much drug to take. Do not take more drug than directed. Get emergency help right away if you have problems breathing. Do not suddenly stop taking your drug because you may develop a severe reaction. Your body becomes used to the drug. This does NOT mean you are addicted. Addiction is a behavior related to getting and using a drug for a nonmedical reason. If you have pain, you have a medical reason to take pain drug. Your health care provider will tell you how much drug to take. If your health care provider wants you to stop the drug, the dose will be slowly lowered over time to avoid any side effects. Talk to your health care provider about naloxone and how to get it. Naloxone is an emergency drug used for an opioid overdose. An overdose can happen if you take too much opioid. It can also happen if an opioid is taken with some other drugs or substances, like alcohol. Know the symptoms of an overdose, like trouble breathing, unusually tired or sleepy, or not being able to respond or wake up. Make sure to tell caregivers and close contacts where it is stored. Make sure they know how to use it. After naloxone is given, you must get emergency help right away. Naloxone is a temporary treatment. Repeat doses may be needed. Do not take other drugs that contain acetaminophen with this drug. Many non-prescription drugs contain acetaminophen. Always read labels carefully. If you have questions, ask your health care provider. If you take too much acetaminophen, get medical help right away. Too much acetaminophen can be very dangerous and cause liver damage. Even if you do not have symptoms,  it is important to get help right away. This drug does not prevent a heart attack or stroke. This drug may increase the chance of a heart attack or stroke. The chance may increase the longer you use this drug or if you have heart disease. If you take aspirin to prevent a heart attack or stroke, talk to your health care provider about using this drug. You  may get drowsy or dizzy. Do not drive, use machinery, or do anything that needs mental alertness until you know how this drug affects you. Do not stand up or sit up quickly, especially if you are an older patient. This reduces the risk of dizzy or fainting spells. Alcohol may interfere with the effect of this drug. Avoid alcoholic drinks. This drug will cause constipation. If you do not have a bowel movement for 3 days, call your health care provider. Your mouth may get dry. Chewing sugarless gum or sucking hard candy and drinking plenty of water may help. Contact your health care provider if the problem does not go away or is severe. What side effects may I notice from receiving this medicine? Side effects that you should report to your doctor or health care professional as soon as possible:  allergic reactions (skin rash, itching or hives; swelling of the face, lips, or tongue)  confusion  kidney injury (trouble passing urine or change in the amount of urine)  light-colored stool  liver injury (dark yellow or brown urine; general ill feeling or flu-like symptoms; loss of appetite, right upper belly pain; unusually weak or tired, yellowing of the eyes or skin)  low adrenal gland function (nausea; vomiting; loss of appetite; unusually weak or tired; dizziness; low blood pressure)  low blood pressure (dizziness; feeling faint or lightheaded, falls; unusually weak or tired)  redness, blistering, peeling, or loosening of the skin, including inside the mouth  serotonin syndrome (irritable; confusion; diarrhea; fast or irregular heartbeat; muscle  twitching; stiff muscles; trouble walking; sweating; high fever; seizures; chills; vomiting)  trouble breathing Side effects that usually do not require medical attention (report to your doctor or health care professional if they continue or are bothersome):  constipation  dry mouth  nausea, vomiting  tiredness This list may not describe all possible side effects. Call your doctor for medical advice about side effects. You may report side effects to FDA at 1-800-FDA-1088. Where should I keep my medicine? Keep out of the reach of children and pets. This medicine can be abused. Keep it in a safe place to protect it from theft. Do not share it with anyone. It is only for you. Selling or giving away this medicine is dangerous and against the law. Store at room temperature between 20 and 25 degrees C (68 and 77 degrees F). Protect from light. Get rid of any unused medicine after the expiration date. This medicine may cause harm and death if it is taken by other adults, children, or pets. It is important to get rid of the medicine as soon as you no longer need it or it is expired. You can do this in two ways:  Take the medicine to a medicine take-back program. Check with your pharmacy or law enforcement to find a location.  If you cannot return the medicine, flush it down the toilet. NOTE: This sheet is a summary. It may not cover all possible information. If you have questions about this medicine, talk to your doctor, pharmacist, or health care provider.  2021 Elsevier/[employer] Standard (2020-02-15 11:12:15)

## 2020-10-04 NOTE — Transfer of Care (Signed)
Immediate Anesthesia Transfer of Care Note  Patient: Azrael D Melrose  Procedure(s) Performed: CYSTOSCOPY WITH LEFT RETROGRADE PYELOGRAM, DIAGNOSTIC LEFT URETEROSCOPY AND LEFT URETEREAL STENT PLACEMENT (Left )  Patient Location: PACU  Anesthesia Type:General  Level of Consciousness: drowsy  Airway & Oxygen Therapy: Patient Spontanous Breathing and Patient connected to nasal cannula oxygen  Post-op Assessment: Report given to RN and Post -op Vital signs reviewed and stable  Post vital signs: Reviewed and stable  Last Vitals:  Vitals Value Taken Time  BP    Temp 98   Pulse 119 10/04/20 1302  Resp    SpO2 97 % 10/04/20 1302  Vitals shown include unvalidated device data.  Last Pain:  Vitals:   10/04/20 1138  TempSrc:   PainSc: 9          Complications: No complications documented.

## 2020-10-04 NOTE — Op Note (Signed)
.  Preoperative diagnosis: Left renal stone  Postoperative diagnosis: Same  Procedure: 1 cystoscopy 2. Left retrograde pyelography 3.  Intraoperative fluoroscopy, under one hour, with interpretation 4.  Left diagnostic ureteroscopy 5.  Left 6 x 24 JJ stent placement  Attending: Rosie Fate  Anesthesia: General  Estimated blood loss: None  Drains: Left 6 x 24 JJ ureteral stent without tether  Specimens: none  Antibiotics: ancef  Findings: No hydronephrosis. No masses/lesions in the bladder. Ureteral orifices in normal anatomic location. Unable to advance ureteroscope past the left UPJ  Indications: Patient is a 50 year old female with a history of left renal stone and who has persistent left flank pain.  After discussing treatment options, she decided proceed with left ureteroscopic stone manipulation.  Procedure her in detail: The patient was brought to the operating room and a brief timeout was done to ensure correct patient, correct procedure, correct site.  General anesthesia was administered patient was placed in dorsal lithotomy position.  Her genitalia was then prepped and draped in usual sterile fashion.  A rigid 31 French cystoscope was passed in the urethra and the bladder.  Bladder was inspected free masses or lesions.  the ureteral orifices were in the normal orthotopic locations.  a 6 french ureteral catheter was then instilled into the left ureteral orifice.  a gentle retrograde was obtained and findings noted above.  we then placed a zip wire through the ureteral catheter and advanced up to the renal pelvis.  we then removed the cystoscope and cannulated the left ureteral orifice with a semirigid ureteroscope.  No stone was found in the ureter. Once we reached the UPJ a sensor wire was advanced in to the renal pelvis. We then removed the ureteroscope and advanced a flexible ureteroscope up to the UPJ. Due to the narrow UPJ we were unable to advance the nephroscope into the  renal pelvis. We elected to place a stent. We then placed a 6 x 24 double-j ureteral stent over the original zip wire.  We then removed the wire and good coil was noted in the the renal pelvis under fluoroscopy and the bladder under direct vision. the bladder was then drained and this concluded the procedure which was well tolerated by patient.  Complications: None  Condition: Stable, extubated, transferred to PACU  Plan: Patient is to be discharged home as to follow-up in 2 weeks for

## 2020-10-04 NOTE — Anesthesia Postprocedure Evaluation (Signed)
Anesthesia Post Note  Patient: [name]  Procedure(s) Performed: CYSTOSCOPY WITH LEFT RETROGRADE PYELOGRAM, DIAGNOSTIC LEFT URETEROSCOPY AND LEFT URETEREAL STENT PLACEMENT (Left )  Patient location during evaluation: PACU Anesthesia Type: General Level of consciousness: awake and alert and oriented Pain management: pain level controlled Vital Signs Assessment: post-procedure vital signs reviewed and stable Respiratory status: spontaneous breathing and respiratory function stable Cardiovascular status: blood pressure returned to baseline and stable Postop Assessment: no apparent nausea or vomiting Anesthetic complications: no   No complications documented.   Last Vitals:  Vitals:   10/04/20 1430 10/04/20 1432  BP:  (!) 162/92  Pulse: (!) 123 (!) 122  Resp: 16 18  Temp:  36.9 C  SpO2: 92% 92%    Last Pain:  Vitals:   10/04/20 1432  TempSrc: Axillary  PainSc: 6                  Miangel Flom C Tyrhonda Georgiades

## 2020-10-04 NOTE — H&P (View-Only) (Signed)
Urology Admission H&P  Chief Complaint: left renal calculus  History of Present Illness: [name] is a 50yo here for left ureteroscopic stone extraction. She has an 8mm left ureteral calculus. She has intermittent left pain.  Past Medical History:  Diagnosis Date  . Anxiety   . Chronic kidney disease   . CKD (chronic kidney disease) stage 3, GFR 30-59 ml/min ([facility]) 03/15/2019  . Depression   . Diarrhea 05/04/2017  . Essential hypertension, benign 03/15/2019  . Hypertension   . Hypothyroidism   . Hypothyroidism, adult 03/15/2019  . Kidney stones   . LGSIL on Pap smear of cervix 08/17/2020   Needs colpo per ASCCP immediate risk is 4.3 for CIN 3+risk   . Obesity (BMI 30-39.9) 03/15/2019  . Renal disorder   . Vitamin D deficiency disease 03/15/2019   Past Surgical History:  Procedure Laterality Date  . BIOPSY  06/05/2017   Procedure: BIOPSY;  Surgeon: Rehman, Najeeb U, MD;  [facility];  Service: Endoscopy;;  colon  . CHOLECYSTECTOMY    . COLONOSCOPY N/A 06/05/2017   Procedure: COLONOSCOPY;  Surgeon: Rehman, Najeeb U, MD;  Location: [facility];  Service: Endoscopy;  Laterality: N/A;  8:30  . KIDNEY STONE SURGERY    . kidney stones      Home Medications:  Current Facility-Administered Medications  Medication Dose Route Frequency Provider Last Rate Last Admin  . ceFAZolin (ANCEF) IVPB 2g/100 mL premix  2 g Intravenous 30 min Pre-Op Javar Eshbach L, MD      . lactated ringers infusion   Intravenous Continuous Battula, Rajamani C, MD 50 mL/hr at 10/04/20 1003 New Bag at 10/04/20 1003   Allergies:  Allergies  Allergen Reactions  . Morphine Hives  . Morphine And Related Itching and Hives  . Sulfasalazine Swelling  . Sulfa Antibiotics Swelling  . Tape Other (See Comments)    Surgical   . Other Itching and Other (See Comments)    Surgical     Family History  Problem Relation Age of Onset  . Cancer Father   . Heart failure Father   . Cancer Paternal  Grandmother   . Heart failure Paternal Grandmother   . Cancer Maternal Grandmother   . Colon cancer Neg Hx    Social History:  reports that she is a non-smoker but has been exposed to tobacco smoke. She has never used smokeless tobacco. She reports that she does not drink alcohol and does not use drugs.  Review of Systems  Genitourinary: Positive for flank pain.  All other systems reviewed and are negative.   Physical Exam:  Vital signs in last 24 hours: Temp:  [97.7 F (36.5 C)] 97.7 F (36.5 C) (05/05 0946) Pulse Rate:  [108] 108 (05/05 1138) Resp:  [16-18] 16 (05/05 1138) BP: (162-163)/(84-87) 162/87 (05/05 1138) SpO2:  [95 %-98 %] 95 % (05/05 1138) Physical Exam Vitals reviewed.  Constitutional:      Appearance: Normal appearance.  HENT:     Head: Normocephalic and atraumatic.     Nose: Nose normal.     Mouth/Throat:     Mouth: Mucous membranes are dry.  Eyes:     Extraocular Movements: Extraocular movements intact.     Pupils: Pupils are equal, round, and reactive to light.  Cardiovascular:     Rate and Rhythm: Normal rate and regular rhythm.  Pulmonary:     Effort: Pulmonary effort is normal. No respiratory distress.  Abdominal:     General: Abdomen is flat. There   is no distension.  Musculoskeletal:        General: No swelling. Normal range of motion.     Cervical back: Normal range of motion and neck supple.  Skin:    General: Skin is warm and dry.  Neurological:     General: No focal deficit present.     Mental Status: She is alert and oriented to person, place, and time.  Psychiatric:        Mood and Affect: Mood normal.        Behavior: Behavior normal.        Thought Content: Thought content normal.        Judgment: Judgment normal.     Laboratory Data:  No results found for this or any previous visit (from the past 24 hour(s)). Recent Results (from the past 240 hour(s))  SARS CORONAVIRUS 2 (TAT 6-24 HRS) Nasopharyngeal Nasopharyngeal Swab      Status: None   Collection Time: 10/02/20 11:29 AM   Specimen: Nasopharyngeal Swab  Result Value Ref Range Status   SARS Coronavirus 2 NEGATIVE NEGATIVE Final    Comment: (NOTE) SARS-CoV-2 target nucleic acids are NOT DETECTED.  The SARS-CoV-2 RNA is generally detectable in upper and lower respiratory specimens during the acute phase of infection. Negative results do not preclude SARS-CoV-2 infection, do not rule out co-infections with other pathogens, and should not be used as the sole basis for treatment or other patient management decisions. Negative results must be combined with clinical observations, patient history, and epidemiological information. The expected result is Negative.  Fact Sheet for Patients: https://www.fda.gov/media/138098/download  Fact Sheet for Healthcare Providers: https://www.fda.gov/media/138095/download  This test is not yet approved or cleared by the [place] FDA and  has been authorized for detection and/or diagnosis of SARS-CoV-2 by FDA under an Emergency Use Authorization (EUA). This EUA will remain  in effect (meaning this test can be used) for the duration of the COVID-19 declaration under Se ction 564(b)(1) of the Act, 21 U.S.C. section 360bbb-3(b)(1), unless the authorization is terminated or revoked sooner.  Performed at [facility], [address] [place], [place] [address]    Creatinine: Recent Labs    10/02/20 1140  CREATININE 1.33*   Baseline Creatinine: 1.3  Impression/Assessment:  50yo with left renal calculus  Plan:  -We discussed the management of kidney stones. These options include observation, ureteroscopy, shockwave lithotripsy (ESWL) and percutaneous nephrolithotomy (PCNL). We discussed which options are relevant to the patient's stone(s). We discussed the natural history of kidney stones as well as the complications of untreated stones and the impact on quality of life without treatment as well as with each  of the above listed treatments. We also discussed the efficacy of each treatment in its ability to clear the stone burden. With any of these management options I discussed the signs and symptoms of infection and the need for emergent treatment should these be experienced. For each option we discussed the ability of each procedure to clear the patient of their stone burden.   For observation I described the risks which include but are not limited to silent renal damage, life-threatening infection, need for emergent surgery, failure to pass stone and pain.   For ureteroscopy I described the risks which include bleeding, infection, damage to contiguous structures, positioning injury, ureteral stricture, ureteral avulsion, ureteral injury, need for prolonged ureteral stent, inability to perform ureteroscopy, need for an interval procedure, inability to clear stone burden, stent discomfort/pain, heart attack, stroke, pulmonary embolus and   the inherent risks with general anesthesia.   For shockwave lithotripsy I described the risks which include arrhythmia, kidney contusion, kidney hemorrhage, need for transfusion, pain, inability to adequately break up stone, inability to pass stone fragments, Steinstrasse, infection associated with obstructing stones, need for alternate surgical procedure, need for repeat shockwave lithotripsy, MI, CVA, PE and the inherent risks with anesthesia/conscious sedation.   For PCNL I described the risks including positioning injury, pneumothorax, hydrothorax, need for chest tube, inability to clear stone burden, renal laceration, arterial venous fistula or malformation, need for embolization of kidney, loss of kidney or renal function, need for repeat procedure, need for prolonged nephrostomy tube, ureteral avulsion, MI, CVA, PE and the inherent risks of general anesthesia.   - The patient would like to proceed with Left ureteroscopic stone extraction  Nicolette Bang 10/04/2020,  11:54 AM

## 2020-10-04 NOTE — Anesthesia Preprocedure Evaluation (Signed)
Anesthesia Evaluation  Patient identified by MRN, date of birth, ID band Patient awake    Reviewed: Allergy & Precautions, NPO status , Patient's Chart, lab work & pertinent test results, reviewed documented beta blocker date and time   History of Anesthesia Complications Negative for: history of anesthetic complications  Airway Mallampati: II  TM Distance: >3 FB Neck ROM: Full    Dental  (+) Dental Advisory Given, Missing   Pulmonary neg pulmonary ROS,    Pulmonary exam normal breath sounds clear to auscultation       Cardiovascular Exercise Tolerance: Good hypertension, Pt. on medications and Pt. on home beta blockers Normal cardiovascular exam Rhythm:Regular Rate:Normal     Neuro/Psych PSYCHIATRIC DISORDERS Anxiety Depression negative neurological ROS     GI/Hepatic Neg liver ROS, GERD (mild)  Controlled,  Endo/Other  Hypothyroidism   Renal/GU Renal InsufficiencyRenal disease     Musculoskeletal negative musculoskeletal ROS (+)   Abdominal   Peds  Hematology negative hematology ROS (+)   Anesthesia Other Findings   Reproductive/Obstetrics negative OB ROS                            Anesthesia Physical Anesthesia Plan  ASA: III  Anesthesia Plan: General   Post-op Pain Management:    Induction: Intravenous  PONV Risk Score and Plan: 4 or greater and Ondansetron, Dexamethasone and Midazolam  Airway Management Planned: LMA  Additional Equipment:   Intra-op Plan:   Post-operative Plan: Extubation in OR  Informed Consent: I have reviewed the patients History and Physical, chart, labs and discussed the procedure including the risks, benefits and alternatives for the proposed anesthesia with the patient or authorized representative who has indicated his/her understanding and acceptance.     Dental advisory given  Plan Discussed with: CRNA and Surgeon  Anesthesia Plan  Comments:        Anesthesia Quick Evaluation

## 2020-10-04 NOTE — H&P (Signed)
Urology Admission H&P  Chief Complaint: left renal calculus  History of Present Illness: [name] is a 50yo here for left ureteroscopic stone extraction. She has an 21mm left ureteral calculus. She has intermittent left pain.  Past Medical History:  Diagnosis Date  . Anxiety   . Chronic kidney disease   . CKD (chronic kidney disease) stage 3, GFR 30-59 ml/min ([facility]) 03/15/2019  . Depression   . Diarrhea 05/04/2017  . Essential hypertension, benign 03/15/2019  . Hypertension   . Hypothyroidism   . Hypothyroidism, adult 03/15/2019  . Kidney stones   . LGSIL on Pap smear of cervix 08/17/2020   Needs colpo per ASCCP immediate risk is 4.3 for CIN 3+risk   . Obesity (BMI 30-39.9) 03/15/2019  . Renal disorder   . Vitamin D deficiency disease 03/15/2019   Past Surgical History:  Procedure Laterality Date  . BIOPSY  06/05/2017   Procedure: BIOPSY;  Surgeon: Rogene Houston, MD;  [facility];  Service: Endoscopy;;  colon  . CHOLECYSTECTOMY    . COLONOSCOPY N/A 06/05/2017   Procedure: COLONOSCOPY;  Surgeon: Rogene Houston, MD;  [facility];  Service: Endoscopy;  Laterality: N/A;  8:30  . KIDNEY STONE SURGERY    . kidney stones      Home Medications:  Current Facility-Administered Medications  Medication Dose Route Frequency Provider Last Rate Last Admin  . ceFAZolin (ANCEF) IVPB 2g/100 mL premix  2 g Intravenous 30 min Pre-Op Cleon Gustin, MD      . lactated ringers infusion   Intravenous Continuous Denese Killings, MD 50 mL/hr at 10/04/20 1003 New Bag at 10/04/20 1003   Allergies:  Allergies  Allergen Reactions  . Morphine Hives  . Morphine And Related Itching and Hives  . Sulfasalazine Swelling  . Sulfa Antibiotics Swelling  . Tape Other (See Comments)    Surgical   . Other Itching and Other (See Comments)    Surgical     Family History  Problem Relation Age of Onset  . Cancer Father   . Heart failure Father   . Cancer Paternal  Grandmother   . Heart failure Paternal Grandmother   . Cancer Maternal Grandmother   . Colon cancer Neg Hx    Social History:  reports that she is a non-smoker but has been exposed to tobacco smoke. She has never used smokeless tobacco. She reports that she does not drink alcohol and does not use drugs.  Review of Systems  Genitourinary: Positive for flank pain.  All other systems reviewed and are negative.   Physical Exam:  Vital signs in last 24 hours: Temp:  [97.7 F (36.5 C)] 97.7 F (36.5 C) (05/05 0946) Pulse Rate:  [108] 108 (05/05 1138) Resp:  [16-18] 16 (05/05 1138) BP: (162-163)/(84-87) 162/87 (05/05 1138) SpO2:  [95 %-98 %] 95 % (05/05 1138) Physical Exam Vitals reviewed.  Constitutional:      Appearance: Normal appearance.  HENT:     Head: Normocephalic and atraumatic.     Nose: Nose normal.     Mouth/Throat:     Mouth: Mucous membranes are dry.  Eyes:     Extraocular Movements: Extraocular movements intact.     Pupils: Pupils are equal, round, and reactive to light.  Cardiovascular:     Rate and Rhythm: Normal rate and regular rhythm.  Pulmonary:     Effort: Pulmonary effort is normal. No respiratory distress.  Abdominal:     General: Abdomen is flat. There  is no distension.  Musculoskeletal:        General: No swelling. Normal range of motion.     Cervical back: Normal range of motion and neck supple.  Skin:    General: Skin is warm and dry.  Neurological:     General: No focal deficit present.     Mental Status: She is alert and oriented to person, place, and time.  Psychiatric:        Mood and Affect: Mood normal.        Behavior: Behavior normal.        Thought Content: Thought content normal.        Judgment: Judgment normal.     Laboratory Data:  No results found for this or any previous visit (from the past 24 hour(s)). Recent Results (from the past 240 hour(s))  SARS CORONAVIRUS 2 (TAT 6-24 HRS) Nasopharyngeal Nasopharyngeal Swab      Status: None   Collection Time: 10/02/20 11:29 AM   Specimen: Nasopharyngeal Swab  Result Value Ref Range Status   SARS Coronavirus 2 NEGATIVE NEGATIVE Final    Comment: (NOTE) SARS-CoV-2 target nucleic acids are NOT DETECTED.  The SARS-CoV-2 RNA is generally detectable in upper and lower respiratory specimens during the acute phase of infection. Negative results do not preclude SARS-CoV-2 infection, do not rule out co-infections with other pathogens, and should not be used as the sole basis for treatment or other patient management decisions. Negative results must be combined with clinical observations, patient history, and epidemiological information. The expected result is Negative.  Fact Sheet for Patients: SugarRoll.be  Fact Sheet for Healthcare Providers: https://www.woods-mathews.com/  This test is not yet approved or cleared by the [place] FDA and  has been authorized for detection and/or diagnosis of SARS-CoV-2 by FDA under an Emergency Use Authorization (EUA). This EUA will remain  in effect (meaning this test can be used) for the duration of the COVID-19 declaration under Se ction 564(b)(1) of the Act, 21 U.S.C. section 360bbb-3(b)(1), unless the authorization is terminated or revoked sooner.  Performed at [facility], [place] [address], [place],  [address]    Creatinine: Recent Labs    10/02/20 1140  CREATININE 1.33*   Baseline Creatinine: 1.3  Impression/Assessment:  50yo with left renal calculus  Plan:  -We discussed the management of kidney stones. These options include observation, ureteroscopy, shockwave lithotripsy (ESWL) and percutaneous nephrolithotomy (PCNL). We discussed which options are relevant to the patient's stone(s). We discussed the natural history of kidney stones as well as the complications of untreated stones and the impact on quality of life without treatment as well as with each  of the above listed treatments. We also discussed the efficacy of each treatment in its ability to clear the stone burden. With any of these management options I discussed the signs and symptoms of infection and the need for emergent treatment should these be experienced. For each option we discussed the ability of each procedure to clear the patient of their stone burden.   For observation I described the risks which include but are not limited to silent renal damage, life-threatening infection, need for emergent surgery, failure to pass stone and pain.   For ureteroscopy I described the risks which include bleeding, infection, damage to contiguous structures, positioning injury, ureteral stricture, ureteral avulsion, ureteral injury, need for prolonged ureteral stent, inability to perform ureteroscopy, need for an interval procedure, inability to clear stone burden, stent discomfort/pain, heart attack, stroke, pulmonary embolus and  the inherent risks with general anesthesia.   For shockwave lithotripsy I described the risks which include arrhythmia, kidney contusion, kidney hemorrhage, need for transfusion, pain, inability to adequately break up stone, inability to pass stone fragments, Steinstrasse, infection associated with obstructing stones, need for alternate surgical procedure, need for repeat shockwave lithotripsy, MI, CVA, PE and the inherent risks with anesthesia/conscious sedation.   For PCNL I described the risks including positioning injury, pneumothorax, hydrothorax, need for chest tube, inability to clear stone burden, renal laceration, arterial venous fistula or malformation, need for embolization of kidney, loss of kidney or renal function, need for repeat procedure, need for prolonged nephrostomy tube, ureteral avulsion, MI, CVA, PE and the inherent risks of general anesthesia.   - The patient would like to proceed with Left ureteroscopic stone extraction  Nicolette Bang 10/04/2020,  11:54 AM

## 2020-10-04 NOTE — Anesthesia Procedure Notes (Signed)
Procedure Name: LMA Insertion Date/Time: 10/04/2020 12:27 PM Performed by: Karna Dupes, CRNA Pre-anesthesia Checklist: Patient identified, Emergency Drugs available, Suction available and Patient being monitored Patient Re-evaluated:Patient Re-evaluated prior to induction Oxygen Delivery Method: Circle system utilized Preoxygenation: Pre-oxygenation with 100% oxygen Induction Type: IV induction LMA: LMA inserted LMA Size: 4.0 Number of attempts: 1 Tube secured with: Tape Dental Injury: Teeth and Oropharynx as per pre-operative assessment

## 2020-10-05 ENCOUNTER — Encounter (HOSPITAL_COMMUNITY): Payer: Self-pay | Admitting: Urology

## 2020-10-08 ENCOUNTER — Other Ambulatory Visit: Payer: Medicare Other | Admitting: Urology

## 2020-10-10 NOTE — Patient Instructions (Signed)
EMI LYMON  10/10/2020     @PREFPERIOPPHARMACY @   Your procedure is scheduled on  10/16/2020   Report to [facility] at  95  A.M.   Call this number if you have problems the morning of surgery:  [phone]   Remember:  Do not eat or drink after midnight.                     Take these medicines the morning of surgery with A SIP OF WATER  Xanax (if needed), amlodipine, carvedilol, clonidine, lamictal, NP thyroid.   Place clean sheets on your bed the night before your procedure and DO NOT sleep with pets this night.  Shower with CHG the night before and the morning of your procedure. DO NOT use CHG on your face hair, or genitals.  After each shower, dry off with a clean towel, put on clean, comfortable clothes and brush your teeth.     Do not wear jewelry, make-up or nail polish.  Do not wear lotions, powders, or perfumes, or deodorant.  Do not shave 48 hours prior to surgery.  Men may shave face and neck.  Do not bring valuables to the hospital.  [facility] is not responsible for any belongings or valuables.  Contacts, dentures or bridgework may not be worn into surgery.  Leave your suitcase in the car.  After surgery it may be brought to your room.  For patients admitted to the hospital, discharge time will be determined by your treatment team.  Patients discharged the day of surgery will not be allowed to drive home and must have someone with them for 24 hours.   Special instructions:  DO NOT smoke tobacco or vape for 24 hours before your procedure.    Please read over the following fact sheets that you were given. Coughing and Deep Breathing, Surgical Site Infection Prevention, Anesthesia Post-op Instructions and Care and Recovery After Surgery       Ureteral Stent Implantation, Care After This sheet gives you information about how to care for yourself after your procedure. Your health care provider may also give you more specific instructions.  If you have problems or questions, contact your health care provider. What can I expect after the procedure? After the procedure, it is common to have:  Nausea.  Mild pain when you urinate. You may feel this pain in your lower back or lower abdomen. The pain should stop within a few minutes after you urinate. This may last for up to 1 week.  A small amount of blood in your urine for several days. Follow these instructions at home: Medicines  Take over-the-counter and prescription medicines only as told by your health care provider.  If you were prescribed an antibiotic medicine, take it as told by your health care provider. Do not stop taking the antibiotic even if you start to feel better.  Do not drive for 24 hours if you were given a sedative during your procedure.  Ask your health care provider if the medicine prescribed to you requires you to avoid driving or using heavy machinery. Activity  Rest as told by your health care provider.  Avoid sitting for a long time without moving. Get up to take short walks every 1-2 hours. This is important to improve blood flow and breathing. Ask for help if you feel weak or unsteady.  Return to your normal activities as told by your health care provider. Ask  your health care provider what activities are safe for you. General instructions  Watch for any blood in your urine. Call your health care provider if the amount of blood in your urine increases.  If you have a catheter: ? Follow instructions from your health care provider about taking care of your catheter and collection bag. ? Do not take baths, swim, or use a hot tub until your health care provider approves. Ask your health care provider if you may take showers. You may only be allowed to take sponge baths.  Drink enough fluid to keep your urine pale yellow.  Do not use any products that contain nicotine or tobacco, such as cigarettes, e-cigarettes, and chewing tobacco. These can  delay healing after surgery. If you need help quitting, ask your health care provider.  Keep all follow-up visits as told by your health care provider. This is important.   Contact a health care provider if:  You have pain that gets worse or does not get better with medicine, especially pain when you urinate.  You have difficulty urinating.  You feel nauseous or you vomit repeatedly during a period of more than 2 days after the procedure. Get help right away if:  Your urine is dark red or has blood clots in it.  You are leaking urine (have incontinence).  The end of the stent comes out of your urethra.  You cannot urinate.  You have sudden, sharp, or severe pain in your abdomen or lower back.  You have a fever.  You have swelling or pain in your legs.  You have difficulty breathing. Summary  After the procedure, it is common to have mild pain when you urinate that goes away within a few minutes after you urinate. This may last for up to 1 week.  Watch for any blood in your urine. Call your health care provider if the amount of blood in your urine increases.  Take over-the-counter and prescription medicines only as told by your health care provider.  Drink enough fluid to keep your urine pale yellow. This information is not intended to replace advice given to you by your health care provider. Make sure you discuss any questions you have with your health care provider. Document Revised: 02/23/2018 Document Reviewed: 02/24/2018 Elsevier Patient Education  2021 [place] Anesthesia, Adult, Care After This sheet gives you information about how to care for yourself after your procedure. Your health care provider may also give you more specific instructions. If you have problems or questions, contact your health care provider. What can I expect after the procedure? After the procedure, the following side effects are common:  Pain or discomfort at the IV  site.  Nausea.  Vomiting.  Sore throat.  Trouble concentrating.  Feeling cold or chills.  Feeling weak or tired.  Sleepiness and fatigue.  Soreness and body aches. These side effects can affect parts of the body that were not involved in surgery. Follow these instructions at home: For the time period you were told by your health care provider:  Rest.  Do not participate in activities where you could fall or become injured.  Do not drive or use machinery.  Do not drink alcohol.  Do not take sleeping pills or medicines that cause drowsiness.  Do not make important decisions or sign legal documents.  Do not take care of children on your own.   Eating and drinking  Follow any instructions from your health care provider about eating or drinking  restrictions.  When you feel hungry, start by eating small amounts of foods that are soft and easy to digest (bland), such as toast. Gradually return to your regular diet.  Drink enough fluid to keep your urine pale yellow.  If you vomit, rehydrate by drinking water, juice, or clear broth. General instructions  If you have sleep apnea, surgery and certain medicines can increase your risk for breathing problems. Follow instructions from your health care provider about wearing your sleep device: ? Anytime you are sleeping, including during daytime naps. ? While taking prescription pain medicines, sleeping medicines, or medicines that make you drowsy.  Have a responsible adult stay with you for the time you are told. It is important to have someone help care for you until you are awake and alert.  Return to your normal activities as told by your health care provider. Ask your health care provider what activities are safe for you.  Take over-the-counter and prescription medicines only as told by your health care provider.  If you smoke, do not smoke without supervision.  Keep all follow-up visits as told by your health care  provider. This is important. Contact a health care provider if:  You have nausea or vomiting that does not get better with medicine.  You cannot eat or drink without vomiting.  You have pain that does not get better with medicine.  You are unable to pass urine.  You develop a skin rash.  You have a fever.  You have redness around your IV site that gets worse. Get help right away if:  You have difficulty breathing.  You have chest pain.  You have blood in your urine or stool, or you vomit blood. Summary  After the procedure, it is common to have a sore throat or nausea. It is also common to feel tired.  Have a responsible adult stay with you for the time you are told. It is important to have someone help care for you until you are awake and alert.  When you feel hungry, start by eating small amounts of foods that are soft and easy to digest (bland), such as toast. Gradually return to your regular diet.  Drink enough fluid to keep your urine pale yellow.  Return to your normal activities as told by your health care provider. Ask your health care provider what activities are safe for you. This information is not intended to replace advice given to you by your health care provider. Make sure you discuss any questions you have with your health care provider. Document Revised: 02/02/2020 Document Reviewed: 09/01/2019 Elsevier Patient Education  2021 [employer].

## 2020-10-12 ENCOUNTER — Encounter: Payer: Self-pay | Admitting: Urology

## 2020-10-12 MED ORDER — HYDROMORPHONE HCL 2 MG PO TABS: 2.0000 mg | ORAL_TABLET | ORAL | 0 refills | Status: DC | PRN

## 2020-10-15 ENCOUNTER — Encounter (HOSPITAL_COMMUNITY)
Admission: RE | Admit: 2020-10-15 | Discharge: 2020-10-15 | Disposition: A | Discharge status: Home / Self Care | Payer: Medicare Other | Source: Ambulatory Visit | Attending: Urology | Admitting: Urology

## 2020-10-15 ENCOUNTER — Other Ambulatory Visit (HOSPITAL_COMMUNITY)
Admission: RE | Admit: 2020-10-15 | Discharge: 2020-10-15 | Disposition: A | Discharge status: Home / Self Care | Payer: Medicare Other | Source: Ambulatory Visit | Attending: Urology | Admitting: Urology

## 2020-10-15 ENCOUNTER — Encounter (HOSPITAL_COMMUNITY): Payer: Self-pay

## 2020-10-15 ENCOUNTER — Other Ambulatory Visit: Payer: Self-pay

## 2020-10-15 DIAGNOSIS — Z20822 Contact with and (suspected) exposure to covid-19: Secondary | ICD-10-CM | POA: Insufficient documentation

## 2020-10-15 DIAGNOSIS — Z01812 Encounter for preprocedural laboratory examination: Secondary | ICD-10-CM | POA: Insufficient documentation

## 2020-10-15 LAB — SARS CORONAVIRUS 2 (TAT 6-24 HRS): SARS Coronavirus 2: NEGATIVE

## 2020-10-15 LAB — PREGNANCY, URINE: Preg Test, Ur: NEGATIVE

## 2020-10-16 ENCOUNTER — Ambulatory Visit (HOSPITAL_COMMUNITY): Payer: Medicare Other | Admitting: Certified Registered Nurse Anesthetist

## 2020-10-16 ENCOUNTER — Ambulatory Visit (HOSPITAL_COMMUNITY): Payer: Medicare Other

## 2020-10-16 ENCOUNTER — Other Ambulatory Visit: Payer: Medicare Other | Admitting: Urology

## 2020-10-16 ENCOUNTER — Ambulatory Visit (HOSPITAL_COMMUNITY)
Admission: RE | Admit: 2020-10-16 | Discharge: 2020-10-16 | Disposition: A | Discharge status: Home / Self Care | Payer: Medicare Other | Attending: Urology | Admitting: Urology

## 2020-10-16 ENCOUNTER — Encounter (HOSPITAL_COMMUNITY): Payer: Self-pay | Admitting: Urology

## 2020-10-16 ENCOUNTER — Encounter (HOSPITAL_COMMUNITY)
Admission: RE | Disposition: A | Discharge status: Home / Self Care | Payer: Self-pay | Source: Home / Self Care | Attending: Urology

## 2020-10-16 DIAGNOSIS — E1122 Type 2 diabetes mellitus with diabetic chronic kidney disease: Secondary | ICD-10-CM | POA: Insufficient documentation

## 2020-10-16 DIAGNOSIS — E039 Hypothyroidism, unspecified: Secondary | ICD-10-CM | POA: Insufficient documentation

## 2020-10-16 DIAGNOSIS — Z9049 Acquired absence of other specified parts of digestive tract: Secondary | ICD-10-CM | POA: Diagnosis not present

## 2020-10-16 DIAGNOSIS — N200 Calculus of kidney: Secondary | ICD-10-CM | POA: Diagnosis not present

## 2020-10-16 DIAGNOSIS — E669 Obesity, unspecified: Secondary | ICD-10-CM | POA: Diagnosis not present

## 2020-10-16 DIAGNOSIS — Z885 Allergy status to narcotic agent status: Secondary | ICD-10-CM | POA: Insufficient documentation

## 2020-10-16 DIAGNOSIS — N202 Calculus of kidney with calculus of ureter: Secondary | ICD-10-CM | POA: Diagnosis not present

## 2020-10-16 DIAGNOSIS — Z882 Allergy status to sulfonamides status: Secondary | ICD-10-CM | POA: Diagnosis not present

## 2020-10-16 DIAGNOSIS — Z7722 Contact with and (suspected) exposure to environmental tobacco smoke (acute) (chronic): Secondary | ICD-10-CM | POA: Diagnosis not present

## 2020-10-16 DIAGNOSIS — Z6841 Body Mass Index (BMI) 40.0 and over, adult: Secondary | ICD-10-CM | POA: Diagnosis not present

## 2020-10-16 DIAGNOSIS — Z8249 Family history of ischemic heart disease and other diseases of the circulatory system: Secondary | ICD-10-CM | POA: Insufficient documentation

## 2020-10-16 DIAGNOSIS — N183 Chronic kidney disease, stage 3 unspecified: Secondary | ICD-10-CM | POA: Insufficient documentation

## 2020-10-16 DIAGNOSIS — I129 Hypertensive chronic kidney disease with stage 1 through stage 4 chronic kidney disease, or unspecified chronic kidney disease: Secondary | ICD-10-CM | POA: Insufficient documentation

## 2020-10-16 HISTORY — PX: CYSTOSCOPY WITH RETROGRADE PYELOGRAM, URETEROSCOPY AND STENT PLACEMENT: SHX5789

## 2020-10-16 HISTORY — PX: HOLMIUM LASER APPLICATION: SHX5852

## 2020-10-16 SURGERY — CYSTOURETEROSCOPY, WITH RETROGRADE PYELOGRAM AND STENT INSERTION
Anesthesia: General | Laterality: Left

## 2020-10-16 MED ORDER — PHENYLEPHRINE 40 MCG/ML (10ML) SYRINGE FOR IV PUSH (FOR BLOOD PRESSURE SUPPORT)
PREFILLED_SYRINGE | INTRAVENOUS | Status: DC | PRN
  Administered 2020-10-16 (×2): 120 ug via INTRAVENOUS
  Administered 2020-10-16: 80 ug via INTRAVENOUS

## 2020-10-16 MED ORDER — LACTATED RINGERS IV SOLN: INTRAVENOUS | Status: DC

## 2020-10-16 MED ORDER — LIDOCAINE 2% (20 MG/ML) 5 ML SYRINGE
INTRAMUSCULAR | Status: DC | PRN
  Administered 2020-10-16: 100 mg via INTRAVENOUS

## 2020-10-16 MED ORDER — GLYCOPYRROLATE PF 0.2 MG/ML IJ SOSY
PREFILLED_SYRINGE | INTRAMUSCULAR | Status: DC | PRN
  Administered 2020-10-16: .1 mg via INTRAVENOUS

## 2020-10-16 MED ORDER — HYDROMORPHONE HCL 1 MG/ML IJ SOLN
INTRAMUSCULAR | Status: AC
  Filled 2020-10-16: qty 0.5

## 2020-10-16 MED ORDER — PHENYLEPHRINE 40 MCG/ML (10ML) SYRINGE FOR IV PUSH (FOR BLOOD PRESSURE SUPPORT)
PREFILLED_SYRINGE | INTRAVENOUS | Status: AC
  Filled 2020-10-16: qty 10

## 2020-10-16 MED ORDER — HYDROMORPHONE HCL 2 MG PO TABS: 2.0000 mg | ORAL_TABLET | Freq: Two times a day (BID) | ORAL | 0 refills | Status: AC | PRN

## 2020-10-16 MED ORDER — DEXAMETHASONE SODIUM PHOSPHATE 10 MG/ML IJ SOLN
INTRAMUSCULAR | Status: AC
  Filled 2020-10-16: qty 1

## 2020-10-16 MED ORDER — PROPOFOL 10 MG/ML IV BOLUS
INTRAVENOUS | Status: DC | PRN
  Administered 2020-10-16: 200 mg via INTRAVENOUS
  Administered 2020-10-16: 70 mg via INTRAVENOUS

## 2020-10-16 MED ORDER — DIPHENHYDRAMINE HCL 50 MG/ML IJ SOLN
INTRAMUSCULAR | Status: AC
  Filled 2020-10-16: qty 1

## 2020-10-16 MED ORDER — HYDROMORPHONE HCL 1 MG/ML IJ SOLN
0.2500 mg | INTRAMUSCULAR | Status: DC | PRN
Start: 2020-10-16 — End: 2020-10-16
  Administered 2020-10-16: 0.5 mg via INTRAVENOUS

## 2020-10-16 MED ORDER — KETAMINE HCL 50 MG/5ML IJ SOSY
PREFILLED_SYRINGE | INTRAMUSCULAR | Status: AC
  Filled 2020-10-16: qty 5

## 2020-10-16 MED ORDER — ORAL CARE MOUTH RINSE: 15.0000 mL | Freq: Once | OROMUCOSAL | Status: AC

## 2020-10-16 MED ORDER — HYDROMORPHONE HCL 1 MG/ML IJ SOLN
INTRAMUSCULAR | Status: AC
  Filled 2020-10-16: qty 1

## 2020-10-16 MED ORDER — SODIUM CHLORIDE 0.9 % IR SOLN
Status: DC | PRN
  Administered 2020-10-16: 3000 mL
  Administered 2020-10-16: 3000 mL via INTRAVESICAL

## 2020-10-16 MED ORDER — MIDAZOLAM HCL 2 MG/2ML IJ SOLN
INTRAMUSCULAR | Status: AC
  Filled 2020-10-16: qty 2

## 2020-10-16 MED ORDER — LIDOCAINE HCL (PF) 2 % IJ SOLN
INTRAMUSCULAR | Status: AC
  Filled 2020-10-16: qty 5

## 2020-10-16 MED ORDER — ONDANSETRON HCL 4 MG/2ML IJ SOLN
INTRAMUSCULAR | Status: AC
  Filled 2020-10-16: qty 2

## 2020-10-16 MED ORDER — ONDANSETRON HCL 4 MG/2ML IJ SOLN: 4.0000 mg | Freq: Once | INTRAMUSCULAR | Status: DC | PRN

## 2020-10-16 MED ORDER — FENTANYL CITRATE (PF) 100 MCG/2ML IJ SOLN
INTRAMUSCULAR | Status: AC
  Filled 2020-10-16: qty 2

## 2020-10-16 MED ORDER — MIDAZOLAM HCL 5 MG/5ML IJ SOLN
INTRAMUSCULAR | Status: DC | PRN
  Administered 2020-10-16: 2 mg via INTRAVENOUS

## 2020-10-16 MED ORDER — FENTANYL CITRATE (PF) 100 MCG/2ML IJ SOLN
25.0000 ug | INTRAMUSCULAR | Status: DC | PRN
  Administered 2020-10-16 (×4): 50 ug via INTRAVENOUS
  Filled 2020-10-16 (×2): qty 2

## 2020-10-16 MED ORDER — DEXAMETHASONE SODIUM PHOSPHATE 4 MG/ML IJ SOLN
INTRAMUSCULAR | Status: DC | PRN
  Administered 2020-10-16: 10 mg via INTRAVENOUS

## 2020-10-16 MED ORDER — CHLORHEXIDINE GLUCONATE 0.12 % MT SOLN
15.0000 mL | Freq: Once | OROMUCOSAL | Status: AC
  Administered 2020-10-16: 15 mL via OROMUCOSAL

## 2020-10-16 MED ORDER — DIPHENHYDRAMINE HCL 50 MG/ML IJ SOLN
INTRAMUSCULAR | Status: DC | PRN
  Administered 2020-10-16: 12.5 mg via INTRAVENOUS

## 2020-10-16 MED ORDER — ONDANSETRON HCL 4 MG/2ML IJ SOLN
INTRAMUSCULAR | Status: DC | PRN
  Administered 2020-10-16: 4 mg via INTRAVENOUS

## 2020-10-16 MED ORDER — HYDROMORPHONE HCL 1 MG/ML IJ SOLN
0.5000 mg | Freq: Once | INTRAMUSCULAR | Status: AC
  Administered 2020-10-16: 0.5 mg via INTRAVENOUS

## 2020-10-16 MED ORDER — KETAMINE HCL 10 MG/ML IJ SOLN
INTRAMUSCULAR | Status: DC | PRN
  Administered 2020-10-16 (×2): 20 mg via INTRAVENOUS

## 2020-10-16 MED ORDER — GLYCOPYRROLATE PF 0.2 MG/ML IJ SOSY
PREFILLED_SYRINGE | INTRAMUSCULAR | Status: AC
  Filled 2020-10-16: qty 1

## 2020-10-16 MED ORDER — PROPOFOL 10 MG/ML IV BOLUS
INTRAVENOUS | Status: AC
  Filled 2020-10-16: qty 20

## 2020-10-16 MED ORDER — FENTANYL CITRATE (PF) 100 MCG/2ML IJ SOLN
INTRAMUSCULAR | Status: DC | PRN
  Administered 2020-10-16 (×2): 50 ug via INTRAVENOUS

## 2020-10-16 MED ORDER — CEFAZOLIN SODIUM-DEXTROSE 2-4 GM/100ML-% IV SOLN
2.0000 g | INTRAVENOUS | Status: AC
  Administered 2020-10-16: 2 g via INTRAVENOUS
  Filled 2020-10-16: qty 100

## 2020-10-16 MED ORDER — DIATRIZOATE MEGLUMINE 30 % UR SOLN
URETHRAL | Status: DC | PRN
  Administered 2020-10-16: 10 mL

## 2020-10-16 MED ORDER — KETOROLAC TROMETHAMINE 30 MG/ML IJ SOLN
INTRAMUSCULAR | Status: AC
  Filled 2020-10-16: qty 1

## 2020-10-16 MED ORDER — DIATRIZOATE MEGLUMINE 30 % UR SOLN
URETHRAL | Status: AC
  Filled 2020-10-16: qty 100

## 2020-10-16 MED ORDER — HYDROMORPHONE HCL 1 MG/ML IJ SOLN
INTRAMUSCULAR | Status: DC | PRN
  Administered 2020-10-16 (×2): .5 mg via INTRAVENOUS

## 2020-10-16 MED ORDER — KETOROLAC TROMETHAMINE 30 MG/ML IJ SOLN
INTRAMUSCULAR | Status: DC | PRN
  Administered 2020-10-16: 30 mg via INTRAVENOUS

## 2020-10-16 MED ORDER — WATER FOR IRRIGATION, STERILE IR SOLN
Status: DC | PRN
  Administered 2020-10-16: 1000 mL

## 2020-10-16 SURGICAL SUPPLY — 26 items
BAG DRAIN URO TABLE W/ADPT NS (BAG) ×2 IMPLANT
BAG DRN 8 ADPR NS SKTRN CSTL (BAG) ×1
BAG HAMPER (MISCELLANEOUS) ×2 IMPLANT
CATH INTERMIT  6FR 70CM (CATHETERS) ×2 IMPLANT
CLOTH BEACON ORANGE TIMEOUT ST (SAFETY) ×2 IMPLANT
DECANTER SPIKE VIAL GLASS SM (MISCELLANEOUS) ×2 IMPLANT
EXTRACTOR STONE NITINOL NGAGE (UROLOGICAL SUPPLIES) ×2 IMPLANT
GLOVE BIO SURGEON STRL SZ8 (GLOVE) ×2 IMPLANT
GLOVE SURG UNDER POLY LF SZ7 (GLOVE) ×4 IMPLANT
GOWN STRL REUS W/TWL LRG LVL3 (GOWN DISPOSABLE) ×2 IMPLANT
GOWN STRL REUS W/TWL XL LVL3 (GOWN DISPOSABLE) ×2 IMPLANT
GUIDEWIRE STR DUAL SENSOR (WIRE) ×2 IMPLANT
GUIDEWIRE STR ZIPWIRE 035X150 (MISCELLANEOUS) ×2 IMPLANT
IV NS IRRIG 3000ML ARTHROMATIC (IV SOLUTION) ×4 IMPLANT
KIT TURNOVER CYSTO (KITS) ×2 IMPLANT
MANIFOLD NEPTUNE II (INSTRUMENTS) ×2 IMPLANT
PACK CYSTO (CUSTOM PROCEDURE TRAY) ×2 IMPLANT
PAD ARMBOARD 7.5X6 YLW CONV (MISCELLANEOUS) ×2 IMPLANT
SHEATH URET ACCESS 12FR/35CM (UROLOGICAL SUPPLIES) ×2 IMPLANT
STENT URET 6FRX24 CONTOUR (STENTS) ×2 IMPLANT
SYR 10ML LL (SYRINGE) ×2 IMPLANT
SYR CONTROL 10ML LL (SYRINGE) ×2 IMPLANT
TOWEL OR 17X26 4PK STRL BLUE (TOWEL DISPOSABLE) ×2 IMPLANT
TRACTIP FLEXIVA PULS ID 200XHI (Laser) ×1 IMPLANT
TRACTIP FLEXIVA PULSE ID 200 (Laser) ×2
WATER STERILE IRR 500ML POUR (IV SOLUTION) ×2 IMPLANT

## 2020-10-16 NOTE — Anesthesia Postprocedure Evaluation (Signed)
Anesthesia Post Note  Patient: [name]  Procedure(s) Performed: CYSTOSCOPY WITH LEFT RETROGRADE PYELOGRAM, LEFT URETEROSCOPY  WITH LASER AND LEFT URETERAL STENT EXCHANGE (Left ) HOLMIUM LASER APPLICATION (Left )  Patient location during evaluation: Phase II Anesthesia Type: General Level of consciousness: awake Pain management: pain level controlled Vital Signs Assessment: post-procedure vital signs reviewed and stable Respiratory status: spontaneous breathing and respiratory function stable Cardiovascular status: blood pressure returned to baseline and stable Postop Assessment: no headache and no apparent nausea or vomiting Anesthetic complications: no Comments: Late entry   No complications documented.   Last Vitals:  Vitals:   10/16/20 1330 10/16/20 1345  BP: 103/65 124/77  Pulse: 87 93  Resp: 17 (!) 30  Temp:    SpO2: 99% 99%    Last Pain:  Vitals:   10/16/20 1427  PainSc: 10-Worst pain ever                 Louann Sjogren

## 2020-10-16 NOTE — Transfer of Care (Signed)
Immediate Anesthesia Transfer of Care Note  Patient: [name]  Procedure(s) Performed: CYSTOSCOPY WITH LEFT RETROGRADE PYELOGRAM, LEFT URETEROSCOPY  WITH LASER AND LEFT URETERAL STENT EXCHANGE (Left ) HOLMIUM LASER APPLICATION (Left )  Patient Location: PACU  Anesthesia Type:General  Level of Consciousness: drowsy  Airway & Oxygen Therapy: Patient Spontanous Breathing and Patient connected to face mask oxygen  Post-op Assessment: Report given to RN and Post -op Vital signs reviewed and stable  Post vital signs: Reviewed and stable  Last Vitals:  Vitals Value Taken Time  BP 112/67 10/16/20 1318  Temp    Pulse 93 10/16/20 1319  Resp 20 10/16/20 1319  SpO2 99 % 10/16/20 1319  Vitals shown include unvalidated device data.  Last Pain:  Vitals:   10/16/20 1045  PainSc: 7          Complications: No complications documented.

## 2020-10-16 NOTE — Interval H&P Note (Signed)
History and Physical Interval Note:  10/16/2020 12:04 PM  [name]  has presented today for surgery, with the diagnosis of left renal calculus.  The various methods of treatment have been discussed with the patient and family. After consideration of risks, benefits and other options for treatment, the patient has consented to  Procedure(s): CYSTOSCOPY WITH LEFT RETROGRADE PYELOGRAM, LEFT URETEROSCOPY  WITH LASER AND LEFT URETERAL STENT EXCHANGE (Left) HOLMIUM LASER APPLICATION (Left) as a surgical intervention.  The patient's history has been reviewed, patient examined, no change in status, stable for surgery.  I have reviewed the patient's chart and labs.  Questions were answered to the patient's satisfaction.     Nicolette Bang

## 2020-10-16 NOTE — Progress Notes (Addendum)
Pt arrives for procedure. C/O laryngitis x4 days. Denies sore throat and difficulty breathing. O2 sat 96% on room air. BBS clear and equal on auscultation. Dr Briant Cedar notified. May proceed with procedure.

## 2020-10-16 NOTE — Op Note (Signed)
.  Preoperative diagnosis: Left ureteral stone  Postoperative diagnosis: Same  Procedure: 1 cystoscopy 2. Left retrograde pyelography 3.  Intraoperative fluoroscopy, under one hour, with interpretation 4.  Left ureteroscopic stone manipulation with laser lithotripsy 5.  Left 6 x 24 JJ stent placement  Attending: Nicolette Bang  Anesthesia: General  Estimated blood loss: None  Drains: Left 6 x 24 JJ ureteral stent with tether  Specimens: stone for analysis  Antibiotics: ancef  Findings: left lower and mid pole stone. Unable to remove entire lower pole calculus due to acute angle of the lower pole. No hydronephrosis. No masses/lesions in the bladder. Ureteral orifices in normal anatomic location.  Indications: Patient is a 50 year old female with a history of left renal stone and who has persistent left flank pain.  After discussing treatment options, she decided proceed with left ureteroscopic stone manipulation.  Procedure her in detail: The patient was brought to the operating room and a brief timeout was done to ensure correct patient, correct procedure, correct site.  General anesthesia was administered patient was placed in dorsal lithotomy position.  Her genitalia was then prepped and draped in usual sterile fashion.  A rigid 72 French cystoscope was passed in the urethra and the bladder.  Bladder was inspected free masses or lesions.  the ureteral orifices were in the normal orthotopic locations.  a 6 french ureteral catheter was then instilled into the left ureteral orifice.  a gentle retrograde was obtained and findings noted above.  Using a grasper the left ureteral stent was brought to the urethral meatus. we then placed a zip wire through the stent and advanced up to the renal pelvis.  we then removed the cystoscope and cannulated the left ureteral orifice with a semirigid ureteroscope.  No stone was found in the ureter. Once we reached the UPJ a sensor wire was advanced in to  the renal pelvis. We then removed the ureteroscope and advanced am 12/14 x 35cm access sheath up to the renal pelvis. We then used the flexible ureteroscope to perform nephroscopy. We encountered the stone in the lower pole and mid pole. The mid pole stone was removed with an NGage basket. We then used a 242nm laser fiber to fragment the lower pole stone which was difficult due to the acute angle of the lower pole.  We fragmented the stone and multiple 1-10mm fragments remained.  Once the fragments were removed we then removed the access sheath under direct vision and noted no injury to the ureter. We then placed a 6 x 26 double-j ureteral stent over the original zip wire.  We then removed the wire and good coil was noted in the the renal pelvis under fluoroscopy and the bladder under direct vision. the bladder was then drained and this concluded the procedure which was well tolerated by patient.  Complications: None  Condition: Stable, extubated, transferred to PACU  Plan: Patient is to be discharged home as to follow-up in one week. She is to remove her stent in 72 hours by gently pulling the tether

## 2020-10-16 NOTE — Discharge Instructions (Signed)
Ureteral Stent Implantation, Care After This sheet gives you information about how to care for yourself after your procedure. Your health care provider may also give you more specific instructions. If you have problems or questions, contact your health care provider. What can I expect after the procedure? After the procedure, it is common to have:  Nausea.  Mild pain when you urinate. You may feel this pain in your lower back or lower abdomen. The pain should stop within a few minutes after you urinate. This may last for up to 1 week.  A small amount of blood in your urine for several days. Follow these instructions at home: Medicines  Take over-the-counter and prescription medicines only as told by your health care provider.  If you were prescribed an antibiotic medicine, take it as told by your health care provider. Do not stop taking the antibiotic even if you start to feel better.  Do not drive for 24 hours if you were given a sedative during your procedure.  Ask your health care provider if the medicine prescribed to you requires you to avoid driving or using heavy machinery. Activity  Rest as told by your health care provider.  Avoid sitting for a long time without moving. Get up to take short walks every 1-2 hours. This is important to improve blood flow and breathing. Ask for help if you feel weak or unsteady.  Return to your normal activities as told by your health care provider. Ask your health care provider what activities are safe for you. General instructions  Watch for any blood in your urine. Call your health care provider if the amount of blood in your urine increases.  If you have a catheter: ? Follow instructions from your health care provider about taking care of your catheter and collection bag. ? Do not take baths, swim, or use a hot tub until your health care provider approves. Ask your health care provider if you may take showers. You may only be allowed to  take sponge baths.  Drink enough fluid to keep your urine pale yellow.  Do not use any products that contain nicotine or tobacco, such as cigarettes, e-cigarettes, and chewing tobacco. These can delay healing after surgery. If you need help quitting, ask your health care provider.  Keep all follow-up visits as told by your health care provider. This is important.   Contact a health care provider if:  You have pain that gets worse or does not get better with medicine, especially pain when you urinate.  You have difficulty urinating.  You feel nauseous or you vomit repeatedly during a period of more than 2 days after the procedure. Get help right away if:  Your urine is dark red or has blood clots in it.  You are leaking urine (have incontinence).  The end of the stent comes out of your urethra.  You cannot urinate.  You have sudden, sharp, or severe pain in your abdomen or lower back.  You have a fever.  You have swelling or pain in your legs.  You have difficulty breathing. Summary  After the procedure, it is common to have mild pain when you urinate that goes away within a few minutes after you urinate. This may last for up to 1 week.  Watch for any blood in your urine. Call your health care provider if the amount of blood in your urine increases.  Take over-the-counter and prescription medicines only as told by your health care provider.  Drink enough fluid to keep your urine pale yellow. This information is not intended to replace advice given to you by your health care provider. Make sure you discuss any questions you have with your health care provider. Document Revised: 02/23/2018 Document Reviewed: 02/24/2018 Elsevier Patient Education  2021 [employer]  PLEASE REMOVE YOUR STENT IN 72 HOURS BY GENTLY PULLING THE STRING 

## 2020-10-16 NOTE — OR Nursing (Signed)
Complaints of pain rated it a 8,  Dilaudid given per order.  See mar

## 2020-10-16 NOTE — Anesthesia Procedure Notes (Signed)
Procedure Name: LMA Insertion Date/Time: 10/16/2020 12:16 PM Performed by: Orlie Dakin, CRNA Pre-anesthesia Checklist: Patient identified, Emergency Drugs available, Suction available and Patient being monitored Patient Re-evaluated:Patient Re-evaluated prior to induction Oxygen Delivery Method: Circle system utilized Preoxygenation: Pre-oxygenation with 100% oxygen Induction Type: IV induction LMA: LMA inserted LMA Size: 4.0 Tube type: Oral Number of attempts: 1 Placement Confirmation: positive ETCO2 Tube secured with: Tape Dental Injury: Teeth and Oropharynx as per pre-operative assessment

## 2020-10-16 NOTE — Anesthesia Preprocedure Evaluation (Signed)
Anesthesia Evaluation  Patient identified by MRN, date of birth, ID band Patient awake    Reviewed: Allergy & Precautions, NPO status , Patient's Chart, lab work & pertinent test results, reviewed documented beta blocker date and time   History of Anesthesia Complications Negative for: history of anesthetic complications  Airway Mallampati: II  TM Distance: >3 FB Neck ROM: Full    Dental  (+) Dental Advisory Given, Missing   Pulmonary neg pulmonary ROS,    Pulmonary exam normal breath sounds clear to auscultation       Cardiovascular Exercise Tolerance: Good hypertension, Pt. on medications and Pt. on home beta blockers Normal cardiovascular exam Rhythm:Regular Rate:Normal     Neuro/Psych PSYCHIATRIC DISORDERS Anxiety Depression negative neurological ROS     GI/Hepatic Neg liver ROS, GERD (mild)  Controlled,  Endo/Other  Hypothyroidism   Renal/GU Renal InsufficiencyRenal disease     Musculoskeletal negative musculoskeletal ROS (+)   Abdominal   Peds  Hematology negative hematology ROS (+)   Anesthesia Other Findings Recent laryngitis, lungs clear.  No reactive airway disease history.  Reproductive/Obstetrics negative OB ROS                             Anesthesia Physical  Anesthesia Plan  ASA: III  Anesthesia Plan: General   Post-op Pain Management:    Induction: Intravenous  PONV Risk Score and Plan: 4 or greater and Ondansetron and Midazolam  Airway Management Planned: LMA  Additional Equipment:   Intra-op Plan:   Post-operative Plan: Extubation in OR  Informed Consent: I have reviewed the patients History and Physical, chart, labs and discussed the procedure including the risks, benefits and alternatives for the proposed anesthesia with the patient or authorized representative who has indicated his/her understanding and acceptance.     Dental advisory given  Plan  Discussed with: CRNA and Surgeon  Anesthesia Plan Comments:         Anesthesia Quick Evaluation

## 2020-10-17 ENCOUNTER — Encounter (HOSPITAL_COMMUNITY): Payer: Self-pay | Admitting: Urology

## 2020-10-22 ENCOUNTER — Telehealth: Payer: Self-pay

## 2020-10-22 NOTE — Telephone Encounter (Signed)
Per Dr. Alyson Ingles its ok that patient stent came out early. Patient advised to do heating pad to back , OTC for pain, and to f/u on scheduled f/u appointment. Patient voiced understanding.

## 2020-10-23 ENCOUNTER — Other Ambulatory Visit: Payer: Self-pay | Admitting: Urology

## 2020-10-24 ENCOUNTER — Encounter (HOSPITAL_COMMUNITY): Payer: Self-pay

## 2020-10-24 ENCOUNTER — Other Ambulatory Visit: Payer: Self-pay

## 2020-10-24 ENCOUNTER — Emergency Department (HOSPITAL_COMMUNITY)
Admission: EM | Admit: 2020-10-24 | Discharge: 2020-10-24 | Disposition: A | Discharge status: Home / Self Care | Payer: Medicare Other | Attending: Emergency Medicine | Admitting: Emergency Medicine

## 2020-10-24 ENCOUNTER — Emergency Department (HOSPITAL_COMMUNITY): Payer: Medicare Other

## 2020-10-24 DIAGNOSIS — Z79899 Other long term (current) drug therapy: Secondary | ICD-10-CM | POA: Diagnosis not present

## 2020-10-24 DIAGNOSIS — Z7722 Contact with and (suspected) exposure to environmental tobacco smoke (acute) (chronic): Secondary | ICD-10-CM | POA: Insufficient documentation

## 2020-10-24 DIAGNOSIS — S2232XA Fracture of one rib, left side, initial encounter for closed fracture: Secondary | ICD-10-CM | POA: Diagnosis not present

## 2020-10-24 DIAGNOSIS — J069 Acute upper respiratory infection, unspecified: Secondary | ICD-10-CM

## 2020-10-24 DIAGNOSIS — S2242XA Multiple fractures of ribs, left side, initial encounter for closed fracture: Secondary | ICD-10-CM | POA: Diagnosis not present

## 2020-10-24 DIAGNOSIS — E039 Hypothyroidism, unspecified: Secondary | ICD-10-CM | POA: Insufficient documentation

## 2020-10-24 DIAGNOSIS — I129 Hypertensive chronic kidney disease with stage 1 through stage 4 chronic kidney disease, or unspecified chronic kidney disease: Secondary | ICD-10-CM | POA: Diagnosis not present

## 2020-10-24 DIAGNOSIS — R000 Tachycardia, unspecified: Secondary | ICD-10-CM | POA: Diagnosis not present

## 2020-10-24 DIAGNOSIS — B9789 Other viral agents as the cause of diseases classified elsewhere: Secondary | ICD-10-CM | POA: Diagnosis not present

## 2020-10-24 DIAGNOSIS — N183 Chronic kidney disease, stage 3 unspecified: Secondary | ICD-10-CM | POA: Insufficient documentation

## 2020-10-24 DIAGNOSIS — J9811 Atelectasis: Secondary | ICD-10-CM | POA: Diagnosis not present

## 2020-10-24 DIAGNOSIS — X58XXXA Exposure to other specified factors, initial encounter: Secondary | ICD-10-CM | POA: Diagnosis not present

## 2020-10-24 DIAGNOSIS — S299XXA Unspecified injury of thorax, initial encounter: Secondary | ICD-10-CM | POA: Diagnosis present

## 2020-10-24 MED ORDER — OXYCODONE-ACETAMINOPHEN 5-325 MG PO TABS: 1.0000 | ORAL_TABLET | Freq: Four times a day (QID) | ORAL | 0 refills | Status: DC | PRN

## 2020-10-24 MED ORDER — ALBUTEROL SULFATE HFA 108 (90 BASE) MCG/ACT IN AERS: 1.0000 | INHALATION_SPRAY | Freq: Four times a day (QID) | RESPIRATORY_TRACT | 0 refills | Status: DC | PRN

## 2020-10-24 MED ORDER — BENZONATATE 100 MG PO CAPS: 100.0000 mg | ORAL_CAPSULE | Freq: Three times a day (TID) | ORAL | 0 refills | Status: DC

## 2020-10-24 NOTE — ED Triage Notes (Signed)
Pt reports has had 2 uteroscopes this month and reports has had deep cough with productive sputum over the past month.  Pt says thinks may have fractured her left rib from coughing.  Denies fever.  Reports laryngitis since May 14.

## 2020-10-24 NOTE — ED Provider Notes (Signed)
[facility] EMERGENCY DEPARTMENT Provider Note   CSN: [id number] Arrival date & time: 10/24/20  1119     History No chief complaint on file.   [name] is a 50 y.o. female.  Patient presents to ED for evaluation of left side lateral chest discomfort, associated with cough and URI symptoms for over one week. She developed the chest pain after an intense coughing spell. Recent urologic scope procedure for kidney stones. COVID negative at time of procedure, but was already experiencing URI symptoms.   The history is provided by the patient. No language interpreter was used.  URI Presenting symptoms: congestion and cough   Presenting symptoms: no fever   Severity:  Severe Onset quality:  Gradual Duration:  1 week Timing:  Intermittent Progression:  Unchanged Chronicity:  New Ineffective treatments:  None tried      Past Medical History:  Diagnosis Date  . Anxiety   . Chronic kidney disease   . CKD (chronic kidney disease) stage 3, GFR 30-59 ml/min ([place]) 03/15/2019  . Depression   . Diarrhea 05/04/2017  . Essential hypertension, benign 03/15/2019  . Hypertension   . Hypothyroidism   . Hypothyroidism, adult 03/15/2019  . Kidney stones   . LGSIL on Pap smear of cervix 08/17/2020   Needs colpo per [facility] immediate risk is 4.3 for CIN 3+risk   . Obesity (BMI 30-39.9) 03/15/2019  . Renal disorder   . Vitamin D deficiency disease 03/15/2019    Patient Active Problem List   Diagnosis Date Noted  . LGSIL on Pap smear of cervix 08/17/2020  . Enlarged uterus 08/06/2020  . Depo-Provera contraceptive status 08/06/2020  . Encounter for gynecological examination with Papanicolaou smear of cervix 08/06/2020  . Screening mammogram for breast cancer 08/06/2020  . Essential hypertension, benign 03/15/2019  . Hypothyroidism, adult 03/15/2019  . Obesity (BMI 30-39.9) 03/15/2019  . Vitamin D deficiency disease 03/15/2019  . CKD (chronic kidney disease) stage 3, GFR 30-59 ml/min  ([place]) 03/15/2019  . Noninfectious gastroenteritis 05/06/2017  . Bloody diarrhea 05/06/2017  . Diarrhea 05/04/2017    Past Surgical History:  Procedure Laterality Date  . BIOPSY  06/05/2017   Procedure: BIOPSY;  Surgeon: Rogene Houston, MD;  Location: [facility];  Service: Endoscopy;;  colon  . CHOLECYSTECTOMY    . COLONOSCOPY N/A 06/05/2017   Procedure: COLONOSCOPY;  Surgeon: Rogene Houston, MD;  Location: [facility];  Service: Endoscopy;  Laterality: N/A;  8:30  . CYSTOSCOPY WITH RETROGRADE PYELOGRAM, URETEROSCOPY AND STENT PLACEMENT Left 10/04/2020   Procedure: CYSTOSCOPY WITH LEFT RETROGRADE PYELOGRAM, DIAGNOSTIC LEFT URETEROSCOPY AND LEFT URETEREAL STENT PLACEMENT;  Surgeon: Cleon Gustin, MD;  Location: [facility] OR[facility];  Service: Urology;  Laterality: Left;  . CYSTOSCOPY WITH RETROGRADE PYELOGRAM, URETEROSCOPY AND STENT PLACEMENT Left 10/16/2020   Procedure: CYSTOSCOPY WITH LEFT RETROGRADE PYELOGRAM, LEFT URETEROSCOPY  WITH LASER AND LEFT URETERAL STENT EXCHANGE;  Surgeon: Cleon Gustin, MD;  Location: [facility];  Service: Urology;  Laterality: Left;  . HOLMIUM LASER APPLICATION Left 9/35/7017   Procedure: HOLMIUM LASER APPLICATION;  Surgeon: Cleon Gustin, MD;  Location: [facility];  Service: Urology;  Laterality: Left;  . KIDNEY STONE SURGERY    . kidney stones       OB History    Gravida  1   Para      Term      Preterm      AB  1   Living  0     SAB  IAB      Ectopic      Multiple      Live Births              Family History  Problem Relation Age of Onset  . Cancer Father   . Heart failure Father   . Cancer Paternal Grandmother   . Heart failure Paternal Grandmother   . Cancer Maternal Grandmother   . Colon cancer Neg Hx     Social History   Tobacco Use  . Smoking status: Passive Smoke Exposure - Never Smoker  . Smokeless tobacco: Never Used  Vaping Use  . Vaping Use: Never used  Substance Use Topics  . Alcohol use: No  . Drug  use: No    Home Medications Prior to Admission medications   Medication Sig Start Date End Date Taking? Authorizing Provider  ALPRAZolam Duanne Moron) 1 MG tablet Take 1 mg by mouth 3 (three) times daily as needed for anxiety.     [provider]  amantadine (SYMMETREL) 100 MG capsule Take 100 mg by mouth at bedtime. 03/21/20   [provider]  amLODipine (NORVASC) 5 MG tablet TAKE 1 TABLET BY MOUTH TWICE DAILY Patient taking differently: Take 5 mg by mouth in the morning and at bedtime. 08/07/20   Hurshel Party C, MD  ARIPiprazole (ABILIFY) 5 MG tablet Take 5 mg by mouth at bedtime.    [provider]  carvedilol (COREG) 12.5 MG tablet Take 12.5 mg by mouth 2 (two) times daily with a meal. 06/05/20   [provider]  Cholecalciferol (VITAMIN D3) 125 MCG (5000 UT) TABS Take 10,000 Units by mouth daily.    [provider]  cloNIDine (CATAPRES) 0.3 MG tablet TAKE 1 TABLET BY MOUTH TWICE DAILY Patient taking differently: Take 0.3 mg by mouth 2 (two) times daily. 08/07/20   Doree Albee, MD  diphenhydrAMINE (BENADRYL) 25 MG tablet Take 25 mg by mouth at bedtime. *take with pain medication to prevent itching*    [provider]  doxepin (SINEQUAN) 75 MG capsule Take 225 mg by mouth at bedtime.     [provider]  HYDROmorphone (DILAUDID) 2 MG tablet Take 1 tablet (2 mg total) by mouth every 4 (four) hours as needed for moderate pain or severe pain. 10/12/20   McKenzie, Candee Furbish, MD  lamoTRIgine (LAMICTAL) 200 MG tablet Take 200 mg by mouth in the morning.    [provider]  losartan (COZAAR) 25 MG tablet Take 25 mg by mouth in the morning.    [provider]  medroxyPROGESTERone (DEPO-PROVERA) 150 MG/ML injection Inject 1 mL (150 mg total) into the muscle every 3 (three) months. 05/09/20   Doree Albee, MD  nitrofurantoin (MACRODANTIN) 50 MG capsule Take 1 capsule (50 mg total) by mouth at bedtime. 09/04/20   McKenzie,  Candee Furbish, MD  NP THYROID 60 MG tablet Take 1 tablet (60 mg total) by mouth daily before breakfast. 07/10/20   Doree Albee, MD  oxyCODONE-acetaminophen (PERCOCET) 10-325 MG tablet Take 1 tablet by mouth every 4 (four) hours as needed for pain. Patient taking differently: Take 1 tablet by mouth at bedtime. 10/04/20   McKenzie, Candee Furbish, MD  potassium citrate (UROCIT-K) 5 MEQ (540 MG) SR tablet Take 10 mEq by mouth 3 (three) times daily. 08/16/20   [provider]  QUEtiapine (SEROQUEL XR) 400 MG 24 hr tablet Take 400 mg by mouth at bedtime.     [provider]  Allergies    Morphine and related, Sulfa antibiotics, and Tape  Review of Systems   Review of Systems  Constitutional: Negative for fever.  HENT: Positive for congestion and voice change. Negative for trouble swallowing.   Respiratory: Positive for cough.   Gastrointestinal: Negative for abdominal pain.  Genitourinary: Negative for flank pain.  All other systems reviewed and are negative.   Physical Exam Updated Vital Signs BP 140/89 (BP Location: Right Arm)   Pulse (!) 111   Temp 98.1 F (36.7 C) (Oral)   Resp 16   Ht 5\' 2"  (1.575 m)   Wt 93.9 kg   LMP  (LMP Unknown) Comment: urine HCG negative 10/15/20  SpO2 97%   BMI 37.86 kg/m   Physical Exam Vitals and nursing note reviewed.  Constitutional:      Appearance: Normal appearance.  HENT:     Head: Normocephalic.     Mouth/Throat:     Mouth: Mucous membranes are moist.  Eyes:     Conjunctiva/sclera: Conjunctivae normal.  Cardiovascular:     Rate and Rhythm: Tachycardia present.  Pulmonary:     Effort: Pulmonary effort is normal.     Breath sounds: Normal breath sounds.  Chest:     Chest wall: Tenderness present.  Abdominal:     Palpations: Abdomen is soft.  Musculoskeletal:        General: Normal range of motion.  Skin:    General: Skin is warm and dry.  Neurological:     Mental Status: She is alert and oriented to person, place,  and time.  Psychiatric:        Mood and Affect: Mood normal.        Behavior: Behavior normal.     ED Results / Procedures / Treatments   Labs (all labs ordered are listed, but only abnormal results are displayed) Labs Reviewed  SARS CORONAVIRUS 2 (TAT 6-24 HRS)    EKG None  Radiology DG Ribs Unilateral W/Chest Left  Result Date: 10/24/2020 CLINICAL DATA:  Cough.  Left-sided chest pain. EXAM: LEFT RIBS AND CHEST - 3+ VIEW COMPARISON:  04/20/2015 FINDINGS: Heart size is normal. Mediastinal shadows are normal. The right chest is clear. There is mild atelectasis at the left lung base. No pneumothorax. Left rib films show a nondisplaced fracture of the lateral left eighth rib. IMPRESSION: Nondisplaced fracture of the left lateral eighth rib. Mild atelectasis at the left lung base. Electronically Signed   By: Nelson Chimes M.D.   On: 10/24/2020 13:29    Procedures Procedures   Medications Ordered in ED Medications - No data to display  ED Course  I have reviewed the triage vital signs and the nursing notes.  Pertinent labs & imaging results that were available during my care of the patient were reviewed by me and considered in my medical decision making (see chart for details).    MDM Rules/Calculators/A&P                          Pt symptoms consistent with URI. CXR negative for acute infiltrate, however, left 8th rib fracture noted. Pt will be discharged with symptomatic treatment. Patient sent home with incentive spirometer. RX for antitussive, analgesia, and bronchodilator. Discussed return precautions.  Pt is hemodynamically stable & in NAD prior to discharge. Final Clinical Impression(s) / ED Diagnoses Final diagnoses:  Viral URI with cough  Closed fracture of one rib of left side, initial encounter    Rx /  DC Orders ED Discharge Orders         Ordered    benzonatate (TESSALON) 100 MG capsule  Every 8 hours        10/24/20 1503    albuterol (VENTOLIN HFA) 108 (90  Base) MCG/ACT inhaler  Every 6 hours PRN        10/24/20 1503    oxyCODONE-acetaminophen (PERCOCET/ROXICET) 5-325 MG tablet  Every 6 hours PRN        10/24/20 1503           Etta Quill, NP 10/24/20 1548    Luna Fuse, MD 10/27/20 2002

## 2020-10-25 NOTE — Patient Instructions (Signed)
[name]  10/25/2020     @PREFPERIOPPHARMACY @   Your procedure is scheduled on 10/31/2020.   Report to [facility] at  0800  A.M.   Call this number if you have problems the morning of surgery:  [phone]   Remember:  Do not eat after midnight.  You may drink clear liquids until 0530 am. At 0530 am drink your carb drink and then nothing else to drink.                      Take these medicines the morning of surgery with A SIP OF WATER    Xanax (if needed), amlodipine, carvedilol, clonidine, dilaudid or oxycodone (if needed), lamictal, NP Thyroid.  Use your inhalers before you come and bring your rescue inhaler with you.     Please brush your teeth.  Do not wear jewelry, make-up or nail polish.  Do not wear lotions, powders, or perfumes, or deodorant.  Do not shave 48 hours prior to surgery.  Men may shave face and neck.  Do not bring valuables to the hospital.  [facility] is not responsible for any belongings or valuables.  Contacts, dentures or bridgework may not be worn into surgery.  Leave your suitcase in the car.  After surgery it may be brought to your room.  For patients admitted to the hospital, discharge time will be determined by your treatment team.  Patients discharged the day of surgery will not be allowed to drive home  And must have someone with them for 24 hours.    Special instructions:  DO Not smoke tobacco or vape for 24 hours before your procedure.  Please read over the following fact sheets that you were given. Coughing and Deep Breathing, Surgical Site Infection Prevention, Anesthesia Post-op Instructions and Care and Recovery After Surgery       Cervical Laser Surgery, Care After This sheet gives you information about how to care for yourself after your procedure. Your health care provider may also give you more specific instructions. If you have problems or questions, contact your health care provider. What can I expect  after the procedure? After the procedure, it is common to have:  Pain or discomfort.  Mild cramping.  Bleeding, spotting, or brownish discharge from your vagina. Follow these instructions at home: Activity  Rest as told by your health care provider.  Do not lift anything that is heavier than 10 lb (4.5 kg), or the limit that you are told, until your health care provider says that it is safe.  Do not have sex until your health care provider says it is okay.  Return to your normal activities as told by your health care provider. Ask your health care provider what activities are safe for you.   General instructions  Take over-the-counter and prescription medicines only as told by your health care provider.  Ask your health care provider if the medicine prescribed to you requires you to avoid driving or using heavy machinery.  Wear sanitary pads to absorb any bleeding, spotting, and discharge.  Do not douche or put anything into your vagina, including tampons, until your health care provider says it is okay.  It is up to you to get the results of your procedure. Ask your health care provider, or the department that is doing the procedure, when your results will be ready.  Keep all follow-up visits as told by your health care  provider. This is important. Contact a health care provider if:  Your pain or cramping does not improve.  Your periods are more painful than usual.  You do not get your period as expected. Get help right away if you have:  Any symptoms of infection, such as: ? A fever. ? Chills. ? Discharge that smells bad.  Severe pain in your abdomen.  Heavy bleeding from your vagina (more than a normal period).  Vaginal bleeding with clumps of blood (blood clots). Summary  After this procedure, it is common to have pain or discomfort and mild cramping. It is also common to have bleeding, spotting, or brownish discharge from your vagina.  Do not have sex,  douche, use tampons, or put anything in your vagina until your health care provider says it is okay.  Return to your normal activities as told by your health care provider. Ask your health care provider what activities are safe for you.  Take over-the-counter and prescription medicines only as told by your health care provider.  You may need to wear sanitary pads to absorb any bleeding, spotting, and discharge. This information is not intended to replace advice given to you by your health care provider. Make sure you discuss any questions you have with your health care provider. Document Revised: 11/01/2018 Document Reviewed: 11/01/2018 Elsevier Patient Education  2021 [place] Anesthesia, Adult, Care After This sheet gives you information about how to care for yourself after your procedure. Your health care provider may also give you more specific instructions. If you have problems or questions, contact your health care provider. What can I expect after the procedure? After the procedure, the following side effects are common:  Pain or discomfort at the IV site.  Nausea.  Vomiting.  Sore throat.  Trouble concentrating.  Feeling cold or chills.  Feeling weak or tired.  Sleepiness and fatigue.  Soreness and body aches. These side effects can affect parts of the body that were not involved in surgery. Follow these instructions at home: For the time period you were told by your health care provider:  Rest.  Do not participate in activities where you could fall or become injured.  Do not drive or use machinery.  Do not drink alcohol.  Do not take sleeping pills or medicines that cause drowsiness.  Do not make important decisions or sign legal documents.  Do not take care of children on your own.   Eating and drinking  Follow any instructions from your health care provider about eating or drinking restrictions.  When you feel hungry, start by eating  small amounts of foods that are soft and easy to digest (bland), such as toast. Gradually return to your regular diet.  Drink enough fluid to keep your urine pale yellow.  If you vomit, rehydrate by drinking water, juice, or clear broth. General instructions  If you have sleep apnea, surgery and certain medicines can increase your risk for breathing problems. Follow instructions from your health care provider about wearing your sleep device: ? Anytime you are sleeping, including during daytime naps. ? While taking prescription pain medicines, sleeping medicines, or medicines that make you drowsy.  Have a responsible adult stay with you for the time you are told. It is important to have someone help care for you until you are awake and alert.  Return to your normal activities as told by your health care provider. Ask your health care provider what activities are safe for you.  Take  over-the-counter and prescription medicines only as told by your health care provider.  If you smoke, do not smoke without supervision.  Keep all follow-up visits as told by your health care provider. This is important. Contact a health care provider if:  You have nausea or vomiting that does not get better with medicine.  You cannot eat or drink without vomiting.  You have pain that does not get better with medicine.  You are unable to pass urine.  You develop a skin rash.  You have a fever.  You have redness around your IV site that gets worse. Get help right away if:  You have difficulty breathing.  You have chest pain.  You have blood in your urine or stool, or you vomit blood. Summary  After the procedure, it is common to have a sore throat or nausea. It is also common to feel tired.  Have a responsible adult stay with you for the time you are told. It is important to have someone help care for you until you are awake and alert.  When you feel hungry, start by eating small amounts of  foods that are soft and easy to digest (bland), such as toast. Gradually return to your regular diet.  Drink enough fluid to keep your urine pale yellow.  Return to your normal activities as told by your health care provider. Ask your health care provider what activities are safe for you. This information is not intended to replace advice given to you by your health care provider. Make sure you discuss any questions you have with your health care provider. Document Revised: 02/02/2020 Document Reviewed: 09/01/2019 Elsevier Patient Education  2021 [place]. How to Use Chlorhexidine for Bathing Chlorhexidine gluconate (CHG) is a germ-killing (antiseptic) solution that is used to clean the skin. It can get rid of the bacteria that normally live on the skin and can keep them away for about 24 hours. To clean your skin with CHG, you may be given:  A CHG solution to use in the shower or as part of a sponge bath.  A prepackaged cloth that contains CHG. Cleaning your skin with CHG may help lower the risk for infection:  While you are staying in the intensive care unit of the hospital.  If you have a vascular access, such as a central line, to provide short-term or long-term access to your veins.  If you have a catheter to drain urine from your bladder.  If you are on a ventilator. A ventilator is a machine that helps you breathe by moving air in and out of your lungs.  After surgery. What are the risks? Risks of using CHG include:  A skin reaction.  Hearing loss, if CHG gets in your ears.  Eye injury, if CHG gets in your eyes and is not rinsed out.  The CHG product catching fire. Make sure that you avoid smoking and flames after applying CHG to your skin. Do not use CHG:  If you have a chlorhexidine allergy or have previously reacted to chlorhexidine.  On babies younger than 95 months of age. How to use CHG solution  Use CHG only as told by your health care provider, and follow  the instructions on the label.  Use the full amount of CHG as directed. Usually, this is one bottle. During a shower Follow these steps when using CHG solution during a shower (unless your health care provider gives you different instructions): 1. Start the shower. 2. Use your  normal soap and shampoo to wash your face and hair. 3. Turn off the shower or move out of the shower stream. 4. Pour the CHG onto a clean washcloth. Do not use any type of brush or rough-edged sponge. 5. Starting at your neck, lather your body down to your toes. Make sure you follow these instructions: ? If you will be having surgery, pay special attention to the part of your body where you will be having surgery. Scrub this area for at least 1 minute. ? Do not use CHG on your head or face. If the solution gets into your ears or eyes, rinse them well with water. ? Avoid your genital area. ? Avoid any areas of skin that have broken skin, cuts, or scrapes. ? Scrub your back and under your arms. Make sure to wash skin folds. 6. Let the lather sit on your skin for 1-2 minutes or as long as told by your health care provider. 7. Thoroughly rinse your entire body in the shower. Make sure that all body creases and crevices are rinsed well. 8. Dry off with a clean towel. Do not put any substances on your body afterward--such as powder, lotion, or perfume--unless you are told to do so by your health care provider. Only use lotions that are recommended by the manufacturer. 9. Put on clean clothes or pajamas. 10. If it is the night before your surgery, sleep in clean sheets.   During a sponge bath Follow these steps when using CHG solution during a sponge bath (unless your health care provider gives you different instructions): 1. Use your normal soap and shampoo to wash your face and hair. 2. Pour the CHG onto a clean washcloth. 3. Starting at your neck, lather your body down to your toes. Make sure you follow these  instructions: ? If you will be having surgery, pay special attention to the part of your body where you will be having surgery. Scrub this area for at least 1 minute. ? Do not use CHG on your head or face. If the solution gets into your ears or eyes, rinse them well with water. ? Avoid your genital area. ? Avoid any areas of skin that have broken skin, cuts, or scrapes. ? Scrub your back and under your arms. Make sure to wash skin folds. 4. Let the lather sit on your skin for 1-2 minutes or as long as told by your health care provider. 5. Using a different clean, wet washcloth, thoroughly rinse your entire body. Make sure that all body creases and crevices are rinsed well. 6. Dry off with a clean towel. Do not put any substances on your body afterward--such as powder, lotion, or perfume--unless you are told to do so by your health care provider. Only use lotions that are recommended by the manufacturer. 7. Put on clean clothes or pajamas. 8. If it is the night before your surgery, sleep in clean sheets. How to use CHG prepackaged cloths  Only use CHG cloths as told by your health care provider, and follow the instructions on the label.  Use the CHG cloth on clean, dry skin.  Do not use the CHG cloth on your head or face unless your health care provider tells you to.  When washing with the CHG cloth: ? Avoid your genital area. ? Avoid any areas of skin that have broken skin, cuts, or scrapes. Before surgery Follow these steps when using a CHG cloth to clean before surgery (unless your health care  provider gives you different instructions): 1. Using the CHG cloth, vigorously scrub the part of your body where you will be having surgery. Scrub using a back-and-forth motion for 3 minutes. The area on your body should be completely wet with CHG when you are done scrubbing. 2. Do not rinse. Discard the cloth and let the area air-dry. Do not put any substances on the area afterward, such as powder,  lotion, or perfume. 3. Put on clean clothes or pajamas. 4. If it is the night before your surgery, sleep in clean sheets.   For general bathing Follow these steps when using CHG cloths for general bathing (unless your health care provider gives you different instructions). 1. Use a separate CHG cloth for each area of your body. Make sure you wash between any folds of skin and between your fingers and toes. Wash your body in the following order, switching to a new cloth after each step: ? The front of your neck, shoulders, and chest. ? Both of your arms, under your arms, and your hands. ? Your stomach and groin area, avoiding the genitals. ? Your right leg and foot. ? Your left leg and foot. ? The back of your neck, your back, and your buttocks. 2. Do not rinse. Discard the cloth and let the area air-dry. Do not put any substances on your body afterward--such as powder, lotion, or perfume--unless you are told to do so by your health care provider. Only use lotions that are recommended by the manufacturer. 3. Put on clean clothes or pajamas. Contact a health care provider if:  Your skin gets irritated after scrubbing.  You have questions about using your solution or cloth. Get help right away if:  Your eyes become very red or swollen.  Your eyes itch badly.  Your skin itches badly and is red or swollen.  Your hearing changes.  You have trouble seeing.  You have swelling or tingling in your mouth or throat.  You have trouble breathing.  You swallow any chlorhexidine. Summary  Chlorhexidine gluconate (CHG) is a germ-killing (antiseptic) solution that is used to clean the skin. Cleaning your skin with CHG may help to lower your risk for infection.  You may be given CHG to use for bathing. It may be in a bottle or in a prepackaged cloth to use on your skin. Carefully follow your health care provider's instructions and the instructions on the product label.  Do not use CHG if you  have a chlorhexidine allergy.  Contact your health care provider if your skin gets irritated after scrubbing. This information is not intended to replace advice given to you by your health care provider. Make sure you discuss any questions you have with your health care provider. Document Revised: 11/04/2019 Document Reviewed: 11/04/2019 Elsevier Patient Education  [place].

## 2020-10-30 ENCOUNTER — Other Ambulatory Visit: Payer: Self-pay | Admitting: Obstetrics & Gynecology

## 2020-10-30 ENCOUNTER — Other Ambulatory Visit (HOSPITAL_COMMUNITY)
Admission: RE | Admit: 2020-10-30 | Discharge: 2020-10-30 | Disposition: A | Discharge status: Home / Self Care | Payer: Medicare Other | Source: Ambulatory Visit | Attending: Obstetrics & Gynecology | Admitting: Obstetrics & Gynecology

## 2020-10-30 ENCOUNTER — Encounter (HOSPITAL_COMMUNITY): Payer: Self-pay

## 2020-10-30 ENCOUNTER — Encounter: Payer: Self-pay | Admitting: Urology

## 2020-10-30 ENCOUNTER — Encounter (HOSPITAL_COMMUNITY)
Admission: RE | Admit: 2020-10-30 | Discharge: 2020-10-30 | Disposition: A | Discharge status: Home / Self Care | Payer: Medicare Other | Source: Ambulatory Visit | Attending: Obstetrics & Gynecology | Admitting: Obstetrics & Gynecology

## 2020-10-30 ENCOUNTER — Ambulatory Visit (INDEPENDENT_AMBULATORY_CARE_PROVIDER_SITE_OTHER): Payer: Medicare Other | Admitting: Urology

## 2020-10-30 ENCOUNTER — Other Ambulatory Visit: Payer: Self-pay

## 2020-10-30 VITALS — BP 135/84 | HR 102

## 2020-10-30 DIAGNOSIS — Z01812 Encounter for preprocedural laboratory examination: Secondary | ICD-10-CM | POA: Insufficient documentation

## 2020-10-30 DIAGNOSIS — N200 Calculus of kidney: Secondary | ICD-10-CM

## 2020-10-30 LAB — CBC WITH DIFFERENTIAL/PLATELET
Abs Immature Granulocytes: 0.03 10*3/uL (ref 0.00–0.07)
Basophils Absolute: 0.1 10*3/uL (ref 0.0–0.1)
Basophils Relative: 1 %
Eosinophils Absolute: 0.4 10*3/uL (ref 0.0–0.5)
Eosinophils Relative: 6 %
HCT: 39.8 % (ref 36.0–46.0)
Hemoglobin: 12.6 g/dL (ref 12.0–15.0)
Immature Granulocytes: 0 %
Lymphocytes Relative: 43 %
Lymphs Abs: 3.1 10*3/uL (ref 0.7–4.0)
MCH: 28.3 pg (ref 26.0–34.0)
MCHC: 31.7 g/dL (ref 30.0–36.0)
MCV: 89.4 fL (ref 80.0–100.0)
Monocytes Absolute: 0.7 10*3/uL (ref 0.1–1.0)
Monocytes Relative: 9 %
Neutro Abs: 3 10*3/uL (ref 1.7–7.7)
Neutrophils Relative %: 41 %
Platelets: 410 10*3/uL — ABNORMAL HIGH (ref 150–400)
RBC: 4.45 MIL/uL (ref 3.87–5.11)
RDW: 15.3 % (ref 11.5–15.5)
WBC: 7.2 10*3/uL (ref 4.0–10.5)
nRBC: 0 % (ref 0.0–0.2)

## 2020-10-30 LAB — RAPID HIV SCREEN (HIV 1/2 AB+AG)
HIV 1/2 Antibodies: NONREACTIVE
HIV-1 P24 Antigen - HIV24: NONREACTIVE

## 2020-10-30 LAB — COMPREHENSIVE METABOLIC PANEL
ALT: 28 U/L (ref 0–44)
AST: 18 U/L (ref 15–41)
Albumin: 3.4 g/dL — ABNORMAL LOW (ref 3.5–5.0)
Alkaline Phosphatase: 169 U/L — ABNORMAL HIGH (ref 38–126)
Anion gap: 7 (ref 5–15)
BUN: 19 mg/dL (ref 6–20)
CO2: 23 mmol/L (ref 22–32)
Calcium: 9 mg/dL (ref 8.9–10.3)
Chloride: 104 mmol/L (ref 98–111)
Creatinine, Ser: 1.53 mg/dL — ABNORMAL HIGH (ref 0.44–1.00)
GFR, Estimated: 41 mL/min — ABNORMAL LOW (ref >60)
Glucose, Bld: 91 mg/dL (ref 70–99)
Potassium: 4.3 mmol/L (ref 3.5–5.1)
Sodium: 134 mmol/L — ABNORMAL LOW (ref 135–145)
Total Bilirubin: 0.3 mg/dL (ref 0.3–1.2)
Total Protein: 7.5 g/dL (ref 6.5–8.1)

## 2020-10-30 LAB — URINALYSIS, ROUTINE W REFLEX MICROSCOPIC
Bilirubin Urine: NEGATIVE
Glucose, UA: NEGATIVE mg/dL
Hgb urine dipstick: NEGATIVE
Ketones, ur: NEGATIVE mg/dL
Nitrite: NEGATIVE
Protein, ur: 30 mg/dL — AB
Specific Gravity, Urine: 1.025 (ref 1.005–1.030)
pH: 5 (ref 5.0–8.0)

## 2020-10-30 LAB — HCG, QUANTITATIVE, PREGNANCY: hCG, Beta Chain, Quant, S: <1 m[IU]/mL (ref <5)

## 2020-10-30 MED ORDER — CIPROFLOXACIN HCL 500 MG PO TABS: 500.0000 mg | ORAL_TABLET | Freq: Once | ORAL | Status: DC

## 2020-10-30 MED ORDER — MIRABEGRON ER 25 MG PO TB24: 25.0000 mg | ORAL_TABLET | Freq: Every day | ORAL | 0 refills | Status: DC

## 2020-10-30 NOTE — Progress Notes (Signed)
Urological Symptom Review  Patient reports ureteral stent fell out day after surgery  Patient is experiencing the following symptoms: Frequent urination Hard to postpone urination Get up at night to urinate Leakage of urine   Review of Systems  Gastrointestinal (upper)  : Negative for upper GI symptoms  Gastrointestinal (lower) : Negative for lower GI symptoms  Constitutional : Night Sweats Fatigue  Skin: Negative for skin symptoms  Eyes: Negative for eye symptoms  Ear/Nose/Throat : Sinus problems  Hematologic/Lymphatic: Negative for Hematologic/Lymphatic symptoms  Cardiovascular : Negative for cardiovascular symptoms  Respiratory : Cough Shortness of breath  Endocrine: Negative for endocrine symptoms  Musculoskeletal: Negative for musculoskeletal symptoms  Neurological: Negative for neurological symptoms  Psychologic: Negative for psychiatric symptoms

## 2020-10-30 NOTE — Progress Notes (Signed)
10/30/2020 1:32 PM   NEKESHIA LENHARDT 14-Apr-1971 [id number]  Referring provider: Doree Albee, MD [address] [place],  [place] [address]  Followup Nephrolithiasis  HPI: Mr Kitchen is a 50yo here for followup for nephrolithiasis. She removed her tethered stent POD#2.  No flank pain. She does not worsening urgency and frequency since surgery. Patient was unable to give urine specimen today. No dysuria or hematuria   PMH: Past Medical History:  Diagnosis Date  . Anxiety   . Chronic kidney disease   . CKD (chronic kidney disease) stage 3, GFR 30-59 ml/min ([place]) 03/15/2019  . Depression   . Diarrhea 05/04/2017  . Essential hypertension, benign 03/15/2019  . Hypertension   . Hypothyroidism   . Hypothyroidism, adult 03/15/2019  . Kidney stones   . LGSIL on Pap smear of cervix 08/17/2020   Needs colpo per [facility] immediate risk is 4.3 for CIN 3+risk   . Obesity (BMI 30-39.9) 03/15/2019  . Renal disorder   . Vitamin D deficiency disease 03/15/2019    Surgical History: Past Surgical History:  Procedure Laterality Date  . BIOPSY  06/05/2017   Procedure: BIOPSY;  Surgeon: Rogene Houston, MD;  [facility]: [facility];  Service: Endoscopy;;  colon  . CHOLECYSTECTOMY    . COLONOSCOPY N/A 06/05/2017   Procedure: COLONOSCOPY;  Surgeon: Rogene Houston, MD;  Location: [facility];  Service: Endoscopy;  Laterality: N/A;  8:30  . CYSTOSCOPY WITH RETROGRADE PYELOGRAM, URETEROSCOPY AND STENT PLACEMENT Left 10/04/2020   Procedure: CYSTOSCOPY WITH LEFT RETROGRADE PYELOGRAM, DIAGNOSTIC LEFT URETEROSCOPY AND LEFT URETEREAL STENT PLACEMENT;  Surgeon: Cleon Gustin, MD;  Location: [facility];  Service: Urology;  Laterality: Left;  . CYSTOSCOPY WITH RETROGRADE PYELOGRAM, URETEROSCOPY AND STENT PLACEMENT Left 10/16/2020   Procedure: CYSTOSCOPY WITH LEFT RETROGRADE PYELOGRAM, LEFT URETEROSCOPY  WITH LASER AND LEFT URETERAL STENT EXCHANGE;  Surgeon: Cleon Gustin, MD;  Location: [facility];  Service:  Urology;  Laterality: Left;  . HOLMIUM LASER APPLICATION Left 1/91/4782   Procedure: HOLMIUM LASER APPLICATION;  Surgeon: Cleon Gustin, MD;  Location: [facility];  Service: Urology;  Laterality: Left;  . KIDNEY STONE SURGERY    . kidney stones      Home Medications:  Allergies as of 10/30/2020      Reactions   Morphine And Related Itching, Hives   Sulfa Antibiotics Swelling   Tape Itching, Other (See Comments)   Surgical       Medication List       Accurate as of Oct 30, 2020  1:32 PM. If you have any questions, ask your nurse or doctor.        albuterol 108 (90 Base) MCG/ACT inhaler Commonly known as: VENTOLIN HFA Inhale 1-2 puffs into the lungs every 6 (six) hours as needed for wheezing or shortness of breath.   ALPRAZolam 1 MG tablet Commonly known as: XANAX Take 1 mg by mouth 3 (three) times daily as needed for anxiety.   amantadine 100 MG capsule Commonly known as: SYMMETREL Take 100 mg by mouth at bedtime.   amLODipine 5 MG tablet Commonly known as: NORVASC TAKE 1 TABLET BY MOUTH TWICE DAILY What changed: when to take this   ARIPiprazole 5 MG tablet Commonly known as: ABILIFY Take 5 mg by mouth at bedtime.   benzonatate 100 MG capsule Commonly known as: TESSALON Take 1 capsule (100 mg total) by mouth every 8 (eight) hours.   carvedilol 12.5 MG tablet Commonly known as: COREG Take  12.5 mg by mouth 2 (two) times daily with a meal.   cloNIDine 0.3 MG tablet Commonly known as: CATAPRES TAKE 1 TABLET BY MOUTH TWICE DAILY   diphenhydrAMINE 25 MG tablet Commonly known as: BENADRYL Take 25 mg by mouth at bedtime. *take with pain medication to prevent itching*   doxepin 75 MG capsule Commonly known as: SINEQUAN Take 225 mg by mouth at bedtime.   HYDROmorphone 2 MG tablet Commonly known as: Dilaudid Take 1 tablet (2 mg total) by mouth every 4 (four) hours as needed for moderate pain or severe pain.   lamoTRIgine 200 MG tablet Commonly known as:  LAMICTAL Take 200 mg by mouth in the morning.   losartan 25 MG tablet Commonly known as: COZAAR Take 25 mg by mouth in the morning.   medroxyPROGESTERone 150 MG/ML injection Commonly known as: DEPO-PROVERA Inject 1 mL (150 mg total) into the muscle every 3 (three) months.   nitrofurantoin 50 MG capsule Commonly known as: MACRODANTIN Take 1 capsule (50 mg total) by mouth at bedtime.   NP Thyroid 60 MG tablet Generic drug: thyroid Take 1 tablet (60 mg total) by mouth daily before breakfast.   oxyCODONE-acetaminophen 10-325 MG tablet Commonly known as: Percocet Take 1 tablet by mouth every 4 (four) hours as needed for pain. What changed: when to take this   oxyCODONE-acetaminophen 5-325 MG tablet Commonly known as: PERCOCET/ROXICET Take 1-2 tablets by mouth every 6 (six) hours as needed for severe pain. What changed: Another medication with the same name was changed. Make sure you understand how and when to take each.   potassium citrate 5 MEQ (540 MG) SR tablet Commonly known as: UROCIT-K Take 10 mEq by mouth 3 (three) times daily.   QUEtiapine 400 MG 24 hr tablet Commonly known as: SEROQUEL XR Take 400 mg by mouth at bedtime.   Vitamin D3 125 MCG (5000 UT) Tabs Take 10,000 Units by mouth daily.       Allergies:  Allergies  Allergen Reactions  . Morphine And Related Itching and Hives  . Sulfa Antibiotics Swelling  . Tape Itching and Other (See Comments)    Surgical     Family History: Family History  Problem Relation Age of Onset  . Cancer Father   . Heart failure Father   . Cancer Paternal Grandmother   . Heart failure Paternal Grandmother   . Cancer Maternal Grandmother   . Colon cancer Neg Hx     Social History:  reports that she is a non-smoker but has been exposed to tobacco smoke. She has never used smokeless tobacco. She reports that she does not drink alcohol and does not use drugs.  ROS: All other review of systems were reviewed and are  negative except what is noted above in HPI  Physical Exam: BP 135/84   Pulse (!) 102   LMP  (LMP Unknown) Comment: urine HCG negative 10/15/20  Constitutional:  Alert and oriented, No acute distress. HEENT: [place] AT, moist mucus membranes.  Trachea midline, no masses. Cardiovascular: No clubbing, cyanosis, or edema. Respiratory: Normal respiratory effort, no increased work of breathing. GI: Abdomen is soft, nontender, nondistended, no abdominal masses GU: No CVA tenderness.  Lymph: No cervical or inguinal lymphadenopathy. Skin: No rashes, bruises or suspicious lesions. Neurologic: Grossly intact, no focal deficits, moving all 4 extremities. Psychiatric: Normal mood and affect.  Laboratory Data: Lab Results  Component Value Date   WBC 8.8 10/02/2020   HGB 15.5 (H) 10/02/2020   HCT 47.5 (H)  10/02/2020   MCV 88.5 10/02/2020   PLT 339 10/02/2020    Lab Results  Component Value Date   CREATININE 1.33 (H) 10/02/2020    No results found for: PSA  No results found for: TESTOSTERONE  Lab Results  Component Value Date   HGBA1C 5.1 04/03/2020    Urinalysis    Component Value Date/Time   COLORURINE YELLOW 04/10/2016 0531   APPEARANCEUR Clear 08/29/2020 0857   LABSPEC >1.030 (H) 04/10/2016 0531   PHURINE 5.5 04/10/2016 0531   GLUCOSEU Negative 08/29/2020 0857   HGBUR LARGE (A) 04/10/2016 0531   BILIRUBINUR Negative 08/29/2020 [place] 04/10/2016 0531   PROTEINUR Negative 08/29/2020 0857   PROTEINUR TRACE (A) 04/10/2016 0531   UROBILINOGEN 0.2 01/17/2009 0124   NITRITE Negative 08/29/2020 0857   NITRITE NEGATIVE 04/10/2016 0531   LEUKOCYTESUR 1+ (A) 08/29/2020 0857    Lab Results  Component Value Date   LABMICR See below: 08/29/2020   WBCUA 6-10 (A) 08/29/2020   LABEPIT >10 (A) 08/29/2020   MUCUS Present 08/29/2020   BACTERIA Many (A) 08/29/2020    Pertinent Imaging:  Results for orders placed during the hospital encounter of 08/28/20  Abdomen 1  view (KUB)  Narrative CLINICAL DATA:  Nephrolithiasis  EXAM: ABDOMEN - 1 VIEW  COMPARISON:  CT 08/21/2020  FINDINGS: The bowel gas pattern is normal. 8 mm stone again seen projecting over the left renal shadow. No right sided renal calculi, which is partially obscured by overlying bowel gas and stool. Moderate volume stool throughout the colon. No acute osseous findings. Other significant radiographic abnormality are seen.  IMPRESSION: 8 mm stone projecting over the left renal shadow, similar to prior exam.   Electronically Signed By: Davina Poke D.O. On: 08/29/2020 10:41  No results found for this or any previous visit.  No results found for this or any previous visit.  No results found for this or any previous visit.  Results for orders placed during the hospital encounter of 05/24/20  US RENAL  Narrative CLINICAL DATA:  Proteinuria  EXAM: RENAL / URINARY TRACT ULTRASOUND COMPLETE  COMPARISON:  CT abdomen 03/21/2019  FINDINGS: Right Kidney:  Renal measurements: 12 x 5.2 x 5.8 cm = volume: 189 mL. Echogenicity within normal limits. No mass or hydronephrosis visualized.  Left Kidney:  Renal measurements: 10.5 x 4.9 x 5 cm = volume: 136 mL. Mild renal cortical thinning. 12 mm nonobstructing left renal calculus. Mild increased echogenicity. No mass or hydronephrosis visualized.  Bladder:  Appears normal for degree of bladder distention.  Other:  None.  IMPRESSION: 1. No obstructive uropathy. 2. A 12 mm nonobstructing left renal calculus. 3. Atrophic left kidney.   Electronically Signed By: Kathreen Devoid On: 05/24/2020 13:59  No results found for this or any previous visit.  No results found for this or any previous visit.  Results for orders placed in visit on 08/21/20  CT RENAL STONE STUDY  Narrative CLINICAL DATA:  Left flank pain for 3 months. Hematuria. Nephrolithiasis.  EXAM: CT ABDOMEN AND PELVIS WITHOUT  CONTRAST  TECHNIQUE: Multidetector CT imaging of the abdomen and pelvis was performed following the standard protocol without IV contrast.  COMPARISON:  03/21/2019  FINDINGS: Lower chest: No acute findings.  Hepatobiliary: No mass visualized on this unenhanced exam. Prior cholecystectomy. No evidence of biliary obstruction.  Pancreas: No mass or inflammatory process visualized on this unenhanced exam.  Spleen:  Within normal limits in size.  Adrenals/Urinary tract: Moderate diffuse left renal  parenchymal atrophy and scarring is again seen. A few renal calculi are seen bilaterally, largest in the lower pole of the left kidney measuring 8 mm. No evidence of ureteral calculi or hydronephrosis. Unremarkable unopacified urinary bladder.  Stomach/Bowel: No evidence of obstruction, inflammatory process, or abnormal fluid collections. Normal appendix visualized.  Vascular/Lymphatic: No pathologically enlarged lymph nodes identified. No evidence of abdominal aortic aneurysm.  Reproductive:  No mass or other significant abnormality.  Other:  None.  Musculoskeletal:  No suspicious bone lesions identified.  IMPRESSION: Bilateral nephrolithiasis. No evidence of ureteral calculi, hydronephrosis, or other acute findings.  Chronic diffuse left renal parenchymal atrophy and scarring.   Electronically Signed By: Marlaine Hind M.D. On: 08/21/2020 16:45   Assessment & Plan:    1. Nephrolithiasis -RTC 4 weeks with renal US - ciprofloxacin (CIPRO) tablet 500 mg - Urinalysis, Routine w reflex microscopic   No follow-ups on file.  Nicolette Bang, MD  [facility] [place]

## 2020-10-30 NOTE — Patient Instructions (Signed)

## 2020-10-31 ENCOUNTER — Encounter (HOSPITAL_COMMUNITY)
Admission: RE | Disposition: A | Discharge status: Home / Self Care | Payer: Self-pay | Source: Home / Self Care | Attending: Obstetrics & Gynecology

## 2020-10-31 ENCOUNTER — Ambulatory Visit (HOSPITAL_COMMUNITY): Payer: Medicare Other | Admitting: Certified Registered Nurse Anesthetist

## 2020-10-31 ENCOUNTER — Ambulatory Visit (HOSPITAL_COMMUNITY)
Admission: RE | Admit: 2020-10-31 | Discharge: 2020-10-31 | Disposition: A | Discharge status: Home / Self Care | Payer: Medicare Other | Attending: Obstetrics & Gynecology | Admitting: Obstetrics & Gynecology

## 2020-10-31 ENCOUNTER — Encounter (HOSPITAL_COMMUNITY): Payer: Self-pay | Admitting: Obstetrics & Gynecology

## 2020-10-31 DIAGNOSIS — Z888 Allergy status to other drugs, medicaments and biological substances status: Secondary | ICD-10-CM | POA: Insufficient documentation

## 2020-10-31 DIAGNOSIS — Z793 Long term (current) use of hormonal contraceptives: Secondary | ICD-10-CM | POA: Diagnosis not present

## 2020-10-31 DIAGNOSIS — Z885 Allergy status to narcotic agent status: Secondary | ICD-10-CM | POA: Diagnosis not present

## 2020-10-31 DIAGNOSIS — E1122 Type 2 diabetes mellitus with diabetic chronic kidney disease: Secondary | ICD-10-CM | POA: Insufficient documentation

## 2020-10-31 DIAGNOSIS — N183 Chronic kidney disease, stage 3 unspecified: Secondary | ICD-10-CM | POA: Diagnosis not present

## 2020-10-31 DIAGNOSIS — Z882 Allergy status to sulfonamides status: Secondary | ICD-10-CM | POA: Insufficient documentation

## 2020-10-31 DIAGNOSIS — Z79899 Other long term (current) drug therapy: Secondary | ICD-10-CM | POA: Diagnosis not present

## 2020-10-31 DIAGNOSIS — I129 Hypertensive chronic kidney disease with stage 1 through stage 4 chronic kidney disease, or unspecified chronic kidney disease: Secondary | ICD-10-CM | POA: Diagnosis not present

## 2020-10-31 DIAGNOSIS — E039 Hypothyroidism, unspecified: Secondary | ICD-10-CM | POA: Diagnosis not present

## 2020-10-31 DIAGNOSIS — R87613 High grade squamous intraepithelial lesion on cytologic smear of cervix (HGSIL): Secondary | ICD-10-CM | POA: Diagnosis not present

## 2020-10-31 DIAGNOSIS — Z8249 Family history of ischemic heart disease and other diseases of the circulatory system: Secondary | ICD-10-CM | POA: Diagnosis not present

## 2020-10-31 DIAGNOSIS — Z809 Family history of malignant neoplasm, unspecified: Secondary | ICD-10-CM | POA: Diagnosis not present

## 2020-10-31 DIAGNOSIS — Z7951 Long term (current) use of inhaled steroids: Secondary | ICD-10-CM | POA: Insufficient documentation

## 2020-10-31 HISTORY — PX: LASER ABLATION CONDOLAMATA: SHX5941

## 2020-10-31 SURGERY — ABLATION, CONDYLOMA, USING LASER
Anesthesia: General

## 2020-10-31 MED ORDER — LACTATED RINGERS IV SOLN: INTRAVENOUS | Status: DC

## 2020-10-31 MED ORDER — FENTANYL CITRATE (PF) 100 MCG/2ML IJ SOLN
INTRAMUSCULAR | Status: AC
  Filled 2020-10-31: qty 2

## 2020-10-31 MED ORDER — ONDANSETRON 8 MG PO TBDP: 8.0000 mg | ORAL_TABLET | Freq: Three times a day (TID) | ORAL | 0 refills | Status: DC | PRN

## 2020-10-31 MED ORDER — KETOROLAC TROMETHAMINE 30 MG/ML IJ SOLN
30.0000 mg | Freq: Once | INTRAMUSCULAR | Status: AC
  Administered 2020-10-31: 30 mg via INTRAVENOUS
  Filled 2020-10-31: qty 1

## 2020-10-31 MED ORDER — LIDOCAINE HCL (CARDIAC) PF 100 MG/5ML IV SOSY
PREFILLED_SYRINGE | INTRAVENOUS | Status: DC | PRN
  Administered 2020-10-31: 100 mg via INTRAVENOUS

## 2020-10-31 MED ORDER — FENTANYL CITRATE (PF) 100 MCG/2ML IJ SOLN
25.0000 ug | INTRAMUSCULAR | Status: DC | PRN
  Administered 2020-10-31 (×2): 50 ug via INTRAVENOUS
  Filled 2020-10-31: qty 2

## 2020-10-31 MED ORDER — HYDROMORPHONE HCL 1 MG/ML IJ SOLN
0.5000 mg | INTRAMUSCULAR | Status: DC | PRN
  Administered 2020-10-31 (×2): 0.5 mg via INTRAVENOUS
  Filled 2020-10-31: qty 0.5

## 2020-10-31 MED ORDER — FENTANYL CITRATE (PF) 100 MCG/2ML IJ SOLN
INTRAMUSCULAR | Status: DC | PRN
  Administered 2020-10-31 (×2): 50 ug via INTRAVENOUS

## 2020-10-31 MED ORDER — MIDAZOLAM HCL 5 MG/5ML IJ SOLN
INTRAMUSCULAR | Status: DC | PRN
  Administered 2020-10-31: 2 mg via INTRAVENOUS

## 2020-10-31 MED ORDER — ORAL CARE MOUTH RINSE: 15.0000 mL | Freq: Once | OROMUCOSAL | Status: AC

## 2020-10-31 MED ORDER — CEFAZOLIN SODIUM-DEXTROSE 2-4 GM/100ML-% IV SOLN
2.0000 g | INTRAVENOUS | Status: AC
  Administered 2020-10-31: 2 g via INTRAVENOUS
  Filled 2020-10-31: qty 100

## 2020-10-31 MED ORDER — WATER FOR IRRIGATION, STERILE IR SOLN
Status: DC | PRN
  Administered 2020-10-31: 1000 mL

## 2020-10-31 MED ORDER — KETOROLAC TROMETHAMINE 30 MG/ML IJ SOLN
INTRAMUSCULAR | Status: DC | PRN
  Administered 2020-10-31: 30 mg via INTRAVENOUS

## 2020-10-31 MED ORDER — ONDANSETRON HCL 4 MG/2ML IJ SOLN
INTRAMUSCULAR | Status: DC | PRN
  Administered 2020-10-31: 4 mg via INTRAVENOUS

## 2020-10-31 MED ORDER — HYDROMORPHONE HCL 1 MG/ML IJ SOLN
INTRAMUSCULAR | Status: AC
  Filled 2020-10-31: qty 0.5

## 2020-10-31 MED ORDER — LIDOCAINE HCL (PF) 2 % IJ SOLN
INTRAMUSCULAR | Status: AC
  Filled 2020-10-31: qty 5

## 2020-10-31 MED ORDER — ONDANSETRON HCL 4 MG/2ML IJ SOLN: 4.0000 mg | Freq: Once | INTRAMUSCULAR | Status: DC | PRN

## 2020-10-31 MED ORDER — KETOROLAC TROMETHAMINE 30 MG/ML IJ SOLN
INTRAMUSCULAR | Status: AC
  Filled 2020-10-31: qty 1

## 2020-10-31 MED ORDER — CHLORHEXIDINE GLUCONATE 0.12 % MT SOLN
15.0000 mL | Freq: Once | OROMUCOSAL | Status: AC
  Administered 2020-10-31: 15 mL via OROMUCOSAL

## 2020-10-31 MED ORDER — POVIDONE-IODINE 10 % EX SWAB
2.0000 "application " | Freq: Once | CUTANEOUS | Status: AC
  Administered 2020-10-31: 2 via TOPICAL

## 2020-10-31 MED ORDER — KETOROLAC TROMETHAMINE 10 MG PO TABS: 10.0000 mg | ORAL_TABLET | Freq: Three times a day (TID) | ORAL | 0 refills | Status: DC | PRN

## 2020-10-31 MED ORDER — MIDAZOLAM HCL 2 MG/2ML IJ SOLN
INTRAMUSCULAR | Status: AC
  Filled 2020-10-31: qty 2

## 2020-10-31 MED ORDER — PROPOFOL 10 MG/ML IV BOLUS
INTRAVENOUS | Status: DC | PRN
  Administered 2020-10-31: 200 mg via INTRAVENOUS

## 2020-10-31 MED ORDER — DEXAMETHASONE SODIUM PHOSPHATE 4 MG/ML IJ SOLN
INTRAMUSCULAR | Status: DC | PRN
  Administered 2020-10-31: 10 mg via INTRAVENOUS

## 2020-10-31 MED ORDER — FERRIC SUBSULFATE 259 MG/GM EX SOLN
CUTANEOUS | Status: DC | PRN
  Administered 2020-10-31: 1 via TOPICAL

## 2020-10-31 SURGICAL SUPPLY — 27 items
BAG HAMPER (MISCELLANEOUS) ×2 IMPLANT
CLOTH BEACON ORANGE TIMEOUT ST (SAFETY) ×2 IMPLANT
COVER LIGHT HANDLE STERIS (MISCELLANEOUS) ×4 IMPLANT
COVER WAND RF STERILE (DRAPES) ×2 IMPLANT
ELECT REM PT RETURN 9FT ADLT (ELECTROSURGICAL) ×2
ELECTRODE REM PT RTRN 9FT ADLT (ELECTROSURGICAL) ×1 IMPLANT
GAUZE 4X4 16PLY RFD (DISPOSABLE) ×2 IMPLANT
GLOVE ECLIPSE 8.0 STRL XLNG CF (GLOVE) ×2 IMPLANT
GLOVE SRG 8 PF TXTR STRL LF DI (GLOVE) ×1 IMPLANT
GLOVE SURG UNDER POLY LF SZ7 (GLOVE) ×4 IMPLANT
GLOVE SURG UNDER POLY LF SZ8 (GLOVE) ×2
GOWN STRL REUS W/TWL LRG LVL3 (GOWN DISPOSABLE) ×2 IMPLANT
GOWN STRL REUS W/TWL XL LVL3 (GOWN DISPOSABLE) ×2 IMPLANT
KIT TURNOVER KIT A (KITS) ×2 IMPLANT
LASER FIBER DISP 1000U (UROLOGICAL SUPPLIES) ×2 IMPLANT
MANIFOLD NEPTUNE II (INSTRUMENTS) ×2 IMPLANT
NS IRRIG 1000ML POUR BTL (IV SOLUTION) ×2 IMPLANT
PACK BASIC III (CUSTOM PROCEDURE TRAY) ×2
PACK SRG BSC III STRL LF ECLPS (CUSTOM PROCEDURE TRAY) ×1 IMPLANT
PAD ARMBOARD 7.5X6 YLW CONV (MISCELLANEOUS) ×2 IMPLANT
PREFILTER SMOKE EVAC (FILTER) ×2 IMPLANT
SCOPETTES 8  STERILE (MISCELLANEOUS) ×2
SCOPETTES 8 STERILE (MISCELLANEOUS) ×1 IMPLANT
SET BASIN LINEN APH (SET/KITS/TRAYS/PACK) ×2 IMPLANT
SHEET LAVH (DRAPES) ×2 IMPLANT
TUBING SMOKE EVAC CO2 (TUBING) ×2 IMPLANT
WATER STERILE IRR 1000ML POUR (IV SOLUTION) ×2 IMPLANT

## 2020-10-31 NOTE — Discharge Instructions (Signed)
Cervical Laser Surgery, Care After This sheet gives you information about how to care for yourself after your procedure. Your health care provider may also give you more specific instructions. If you have problems or questions, contact your health care provider. What can I expect after the procedure? After the procedure, it is common to have:  Pain or discomfort.  Mild cramping.  Bleeding, spotting, or brownish discharge from your vagina. Follow these instructions at home: Activity  Rest as told by your health care provider.  Do not lift anything that is heavier than 10 lb (4.5 kg), or the limit that you are told, until your health care provider says that it is safe.  Do not have sex until your health care provider says it is okay.  Return to your normal activities as told by your health care provider. Ask your health care provider what activities are safe for you.   General instructions  Take over-the-counter and prescription medicines only as told by your health care provider.  Ask your health care provider if the medicine prescribed to you requires you to avoid driving or using heavy machinery.  Wear sanitary pads to absorb any bleeding, spotting, and discharge.  Do not douche or put anything into your vagina, including tampons, until your health care provider says it is okay.  It is up to you to get the results of your procedure. Ask your health care provider, or the department that is doing the procedure, when your results will be ready.  Keep all follow-up visits as told by your health care provider. This is important. Contact a health care provider if:  Your pain or cramping does not improve.  Your periods are more painful than usual.  You do not get your period as expected. Get help right away if you have:  Any symptoms of infection, such as: ? A fever. ? Chills. ? Discharge that smells bad.  Severe pain in your abdomen.  Heavy bleeding from your vagina (more  than a normal period).  Vaginal bleeding with clumps of blood (blood clots). Summary  After this procedure, it is common to have pain or discomfort and mild cramping. It is also common to have bleeding, spotting, or brownish discharge from your vagina.  Do not have sex, douche, use tampons, or put anything in your vagina until your health care provider says it is okay.  Return to your normal activities as told by your health care provider. Ask your health care provider what activities are safe for you.  Take over-the-counter and prescription medicines only as told by your health care provider.  You may need to wear sanitary pads to absorb any bleeding, spotting, and discharge. This information is not intended to replace advice given to you by your health care provider. Make sure you discuss any questions you have with your health care provider. Document Revised: 11/01/2018 Document Reviewed: 11/01/2018 Elsevier Patient Education  2021 [place] Anesthesia, Adult, Care After This sheet gives you information about how to care for yourself after your procedure. Your health care provider may also give you more specific instructions. If you have problems or questions, contact your health care provider. What can I expect after the procedure? After the procedure, the following side effects are common:  Pain or discomfort at the IV site.  Nausea.  Vomiting.  Sore throat.  Trouble concentrating.  Feeling cold or chills.  Feeling weak or tired.  Sleepiness and fatigue.  Soreness and body aches.  These side effects can affect parts of the body that were not involved in surgery. Follow these instructions at home: For the time period you were told by your health care provider:  Rest.  Do not participate in activities where you could fall or become injured.  Do not drive or use machinery.  Do not drink alcohol.  Do not take sleeping pills or medicines that cause  drowsiness.  Do not make important decisions or sign legal documents.  Do not take care of children on your own.   Eating and drinking  Follow any instructions from your health care provider about eating or drinking restrictions.  When you feel hungry, start by eating small amounts of foods that are soft and easy to digest (bland), such as toast. Gradually return to your regular diet.  Drink enough fluid to keep your urine pale yellow.  If you vomit, rehydrate by drinking water, juice, or clear broth. General instructions  If you have sleep apnea, surgery and certain medicines can increase your risk for breathing problems. Follow instructions from your health care provider about wearing your sleep device: ? Anytime you are sleeping, including during daytime naps. ? While taking prescription pain medicines, sleeping medicines, or medicines that make you drowsy.  Have a responsible adult stay with you for the time you are told. It is important to have someone help care for you until you are awake and alert.  Return to your normal activities as told by your health care provider. Ask your health care provider what activities are safe for you.  Take over-the-counter and prescription medicines only as told by your health care provider.  If you smoke, do not smoke without supervision.  Keep all follow-up visits as told by your health care provider. This is important. Contact a health care provider if:  You have nausea or vomiting that does not get better with medicine.  You cannot eat or drink without vomiting.  You have pain that does not get better with medicine.  You are unable to pass urine.  You develop a skin rash.  You have a fever.  You have redness around your IV site that gets worse. Get help right away if:  You have difficulty breathing.  You have chest pain.  You have blood in your urine or stool, or you vomit blood. Summary  After the procedure, it is common  to have a sore throat or nausea. It is also common to feel tired.  Have a responsible adult stay with you for the time you are told. It is important to have someone help care for you until you are awake and alert.  When you feel hungry, start by eating small amounts of foods that are soft and easy to digest (bland), such as toast. Gradually return to your regular diet.  Drink enough fluid to keep your urine pale yellow.  Return to your normal activities as told by your health care provider. Ask your health care provider what activities are safe for you. This information is not intended to replace advice given to you by your health care provider. Make sure you discuss any questions you have with your health care provider. Document Revised: 02/02/2020 Document Reviewed: 09/01/2019 Elsevier Patient Education  2021 [employer].

## 2020-10-31 NOTE — Transfer of Care (Signed)
Immediate Anesthesia Transfer of Care Note  Patient: [name]  Procedure(s) Performed: LASER ABLATION OF THE CERVIX (N/A )  Patient Location: PACU  Anesthesia Type:General  Level of Consciousness: awake, alert  and oriented  Airway & Oxygen Therapy: Patient Spontanous Breathing and Patient connected to nasal cannula oxygen  Post-op Assessment: Report given to RN and Post -op Vital signs reviewed and stable  Post vital signs: Reviewed and stable  Last Vitals:  Vitals Value Taken Time  BP 131/75   Temp    Pulse 88 10/31/20 1018  Resp 17 10/31/20 1018  SpO2 95 % 10/31/20 1018  Vitals shown include unvalidated device data.  Last Pain:  Vitals:   10/31/20 0805  TempSrc: Oral  PainSc: 0-No pain         Complications: No complications documented.

## 2020-10-31 NOTE — Op Note (Signed)
Preoperative Diagnosis:  High Grade Squamous Intraepithelial lesion, adequate colposcopy  Postoperative Diagnosis:  Same as above  Procedure:  Laser ablation of the cervix  Surgeon:  Florian Buff MD  Anaesthesia:  Laryngeal Mask Airway  Findings:  Patient had an abnormal pap smear which was evaluated in the office with colposcopy and directed biopsies.  Pathology report returned as suspicious for high Grade SIL in a detached fragment detached as a result of processing. The colposcopy was adequate.  As a result, the patient is admitted for laser ablation of the cervix.  Description of Note:  Patient was taken to the OR and placed in the supine position where she underwent laryngeal mask airway anaesthesia.  She was placed in the dorsal lithotomy position.  She was draped for laser.  Graves speculum was placed and 3% acetic acid used and the laser microscope employed to perform colposcopy which confirmed the office findings.  Laser was used on typical cervical settings and used to vaporized the squamocolumnar junction to  depth of  5-7 mm peripherally and 7-9 mm centrally.  Surgical margin of several mm was employed beyond the acetowhite epithelium.  Hemostasis was achieved with the laser and Monsel's solution.  Patient was awakened from anaesthesia in good stable condition and all counts were correct.  She received Ancef 2 gram and Toradol 30 mg IV preoperatively prophylactically.  Florian Buff, MD 10/31/2020 10:10 AM

## 2020-10-31 NOTE — Anesthesia Postprocedure Evaluation (Signed)
Anesthesia Post Note  Patient: [name]  Procedure(s) Performed: LASER ABLATION OF THE CERVIX (N/A )  Patient location during evaluation: Phase II Anesthesia Type: General Level of consciousness: awake Pain management: pain level controlled Vital Signs Assessment: post-procedure vital signs reviewed and stable Respiratory status: spontaneous breathing and respiratory function stable Cardiovascular status: blood pressure returned to baseline and stable Postop Assessment: no headache and no apparent nausea or vomiting Anesthetic complications: no Comments: Late entry   No complications documented.   Last Vitals:  Vitals:   10/31/20 1115 10/31/20 1133  BP: 131/74 (!) 143/83  Pulse: 92 (!) 109  Resp: 12 18  Temp:  36.8 C  SpO2: 93% 97%    Last Pain:  Vitals:   10/31/20 1133  TempSrc: Oral  PainSc: [place]

## 2020-10-31 NOTE — Interval H&P Note (Signed)
History and Physical Interval Note:  10/31/2020 9:08 AM  [name]  has presented today for surgery, with the diagnosis of Suspected High grade dysplasia of the cervixlaser.  The various methods of treatment have been discussed with the patient and family. After consideration of risks, benefits and other options for treatment, the patient has consented to  Procedure(s): LASER ABLATION of the Cervis (N/A) as a surgical intervention.  The patient's history has been reviewed, patient examined, no change in status, stable for surgery.  I have reviewed the patient's chart and labs.  Questions were answered to the patient's satisfaction.     Florian Buff

## 2020-10-31 NOTE — Anesthesia Preprocedure Evaluation (Signed)
Anesthesia Evaluation  Patient identified by MRN, date of birth, ID band Patient awake    Reviewed: Allergy & Precautions, NPO status , Patient's Chart, lab work & pertinent test results, reviewed documented beta blocker date and time   History of Anesthesia Complications Negative for: history of anesthetic complications  Airway Mallampati: II  TM Distance: >3 FB Neck ROM: Full    Dental  (+) Dental Advisory Given, Missing   Pulmonary neg pulmonary ROS,    Pulmonary exam normal breath sounds clear to auscultation       Cardiovascular Exercise Tolerance: Good hypertension, Pt. on medications and Pt. on home beta blockers Normal cardiovascular exam Rhythm:Regular Rate:Normal     Neuro/Psych PSYCHIATRIC DISORDERS Anxiety Depression negative neurological ROS     GI/Hepatic Neg liver ROS, GERD (mild)  Controlled,  Endo/Other  Hypothyroidism   Renal/GU Renal InsufficiencyRenal disease     Musculoskeletal negative musculoskeletal ROS (+)   Abdominal   Peds  Hematology negative hematology ROS (+)   Anesthesia Other Findings Recent laryngitis, lungs clear.  No reactive airway disease history.  Reproductive/Obstetrics negative OB ROS                             Anesthesia Physical  Anesthesia Plan  ASA: III  Anesthesia Plan: General   Post-op Pain Management:    Induction: Intravenous  PONV Risk Score and Plan: 4 or greater and Ondansetron and Midazolam  Airway Management Planned: LMA  Additional Equipment:   Intra-op Plan:   Post-operative Plan: Extubation in OR  Informed Consent: I have reviewed the patients History and Physical, chart, labs and discussed the procedure including the risks, benefits and alternatives for the proposed anesthesia with the patient or authorized representative who has indicated his/her understanding and acceptance.     Dental advisory given  Plan  Discussed with: CRNA and Surgeon  Anesthesia Plan Comments:         Anesthesia Quick Evaluation

## 2020-10-31 NOTE — Anesthesia Procedure Notes (Signed)
Procedure Name: LMA Insertion Date/Time: 10/31/2020 9:43 AM Performed by: Riki Sheer, CRNA Pre-anesthesia Checklist: Patient identified, Emergency Drugs available, Suction available, Patient being monitored and Timeout performed Patient Re-evaluated:Patient Re-evaluated prior to induction Oxygen Delivery Method: Circle system utilized Preoxygenation: Pre-oxygenation with 100% oxygen Induction Type: IV induction LMA: LMA inserted LMA Size: 4.0 Number of attempts: 1 Placement Confirmation: positive ETCO2,  CO2 detector and breath sounds checked- equal and bilateral Dental Injury: Teeth and Oropharynx as per pre-operative assessment

## 2020-10-31 NOTE — H&P (Signed)
Preoperative History and Physical  [name] is a 50 y.o. G1P0010 with No LMP recorded (lmp unknown). Patient has had an injection. admitted for a laser ablation of the cervix for detached fragment suspicious for HSIL, it was detached in the staining processing.  Pt opts for definitive therapeutic option  PMH:    Past Medical History:  Diagnosis Date  . Anxiety   . Chronic kidney disease   . CKD (chronic kidney disease) stage 3, GFR 30-59 ml/min ([place]) 03/15/2019  . Depression   . Diarrhea 05/04/2017  . Essential hypertension, benign 03/15/2019  . Hypertension   . Hypothyroidism   . Hypothyroidism, adult 03/15/2019  . Kidney stones   . LGSIL on Pap smear of cervix 08/17/2020   Needs colpo per [facility] immediate risk is 4.3 for CIN 3+risk   . Obesity (BMI 30-39.9) 03/15/2019  . Renal disorder   . Vitamin D deficiency disease 03/15/2019    PSH:     Past Surgical History:  Procedure Laterality Date  . BIOPSY  06/05/2017   Procedure: BIOPSY;  Surgeon: Rogene Houston, MD;  Location: [facility];  Service: Endoscopy;;  colon  . CHOLECYSTECTOMY    . COLONOSCOPY N/A 06/05/2017   Procedure: COLONOSCOPY;  Surgeon: Rogene Houston, MD;  Location: [facility];  Service: Endoscopy;  Laterality: N/A;  8:30  . CYSTOSCOPY WITH RETROGRADE PYELOGRAM, URETEROSCOPY AND STENT PLACEMENT Left 10/04/2020   Procedure: CYSTOSCOPY WITH LEFT RETROGRADE PYELOGRAM, DIAGNOSTIC LEFT URETEROSCOPY AND LEFT URETEREAL STENT PLACEMENT;  Surgeon: Cleon Gustin, MD;  Location: [facility];  Service: Urology;  Laterality: Left;  . CYSTOSCOPY WITH RETROGRADE PYELOGRAM, URETEROSCOPY AND STENT PLACEMENT Left 10/16/2020   Procedure: CYSTOSCOPY WITH LEFT RETROGRADE PYELOGRAM, LEFT URETEROSCOPY  WITH LASER AND LEFT URETERAL STENT EXCHANGE;  Surgeon: Cleon Gustin, MD;  Location: [facility];  Service: Urology;  Laterality: Left;  . HOLMIUM LASER APPLICATION Left 7/41/2878   Procedure: HOLMIUM LASER APPLICATION;  Surgeon:  Cleon Gustin, MD;  Location: [facility];  Service: Urology;  Laterality: Left;  . KIDNEY STONE SURGERY    . kidney stones      POb/GynH:      OB History    Gravida  1   Para      Term      Preterm      AB  1   Living  0     SAB      IAB      Ectopic      Multiple      Live Births              SH:   Social History   Tobacco Use  . Smoking status: Passive Smoke Exposure - Never Smoker  . Smokeless tobacco: Never Used  Vaping Use  . Vaping Use: Never used  Substance Use Topics  . Alcohol use: No  . Drug use: No    FH:    Family History  Problem Relation Age of Onset  . Cancer Father   . Heart failure Father   . Cancer Paternal Grandmother   . Heart failure Paternal Grandmother   . Cancer Maternal Grandmother   . Colon cancer Neg Hx      Allergies:  Allergies  Allergen Reactions  . Morphine And Related Itching and Hives  . Sulfa Antibiotics Swelling  . Tape Itching and Other (See Comments)    Surgical     Medications:       Current  Facility-Administered Medications:  .  ciprofloxacin (CIPRO) tablet 500 mg, 500 mg, Oral, Once, McKenzie, Candee Furbish, MD  Current Outpatient Medications:  .  albuterol (VENTOLIN HFA) 108 (90 Base) MCG/ACT inhaler, Inhale 1-2 puffs into the lungs every 6 (six) hours as needed for wheezing or shortness of breath., Disp: 1 each, Rfl: 0 .  ALPRAZolam (XANAX) 1 MG tablet, Take 1 mg by mouth 3 (three) times daily as needed for anxiety. , Disp: , Rfl:  .  amantadine (SYMMETREL) 100 MG capsule, Take 100 mg by mouth at bedtime., Disp: , Rfl:  .  amLODipine (NORVASC) 5 MG tablet, TAKE 1 TABLET BY MOUTH TWICE DAILY (Patient taking differently: Take 5 mg by mouth in the morning and at bedtime.), Disp: 60 tablet, Rfl: 3 .  ARIPiprazole (ABILIFY) 5 MG tablet, Take 5 mg by mouth at bedtime., Disp: , Rfl:  .  benzonatate (TESSALON) 100 MG capsule, Take 1 capsule (100 mg total) by mouth every 8 (eight) hours., Disp: 21 capsule,  Rfl: 0 .  carvedilol (COREG) 12.5 MG tablet, Take 12.5 mg by mouth 2 (two) times daily with a meal., Disp: , Rfl:  .  Cholecalciferol (VITAMIN D3) 125 MCG (5000 UT) TABS, Take 10,000 Units by mouth daily., Disp: , Rfl:  .  cloNIDine (CATAPRES) 0.3 MG tablet, TAKE 1 TABLET BY MOUTH TWICE DAILY (Patient taking differently: Take 0.3 mg by mouth 2 (two) times daily.), Disp: 60 tablet, Rfl: 3 .  diphenhydrAMINE (BENADRYL) 25 MG tablet, Take 25 mg by mouth at bedtime. *take with pain medication to prevent itching*, Disp: , Rfl:  .  doxepin (SINEQUAN) 75 MG capsule, Take 225 mg by mouth at bedtime. , Disp: , Rfl:  .  HYDROmorphone (DILAUDID) 2 MG tablet, Take 1 tablet (2 mg total) by mouth every 4 (four) hours as needed for moderate pain or severe pain., Disp: 30 tablet, Rfl: 0 .  lamoTRIgine (LAMICTAL) 200 MG tablet, Take 200 mg by mouth in the morning., Disp: , Rfl:  .  losartan (COZAAR) 25 MG tablet, Take 25 mg by mouth in the morning., Disp: , Rfl:  .  medroxyPROGESTERone (DEPO-PROVERA) 150 MG/ML injection, Inject 1 mL (150 mg total) into the muscle every 3 (three) months., Disp: 1 mL, Rfl: 3 .  mirabegron ER (MYRBETRIQ) 25 MG TB24 tablet, Take 1 tablet (25 mg total) by mouth daily., Disp: 30 tablet, Rfl: 0 .  nitrofurantoin (MACRODANTIN) 50 MG capsule, Take 1 capsule (50 mg total) by mouth at bedtime., Disp: 90 capsule, Rfl: 0 .  NP THYROID 60 MG tablet, Take 1 tablet (60 mg total) by mouth daily before breakfast., Disp: 30 tablet, Rfl: 3 .  oxyCODONE-acetaminophen (PERCOCET) 10-325 MG tablet, Take 1 tablet by mouth every 4 (four) hours as needed for pain. (Patient taking differently: Take 1 tablet by mouth at bedtime.), Disp: 30 tablet, Rfl: 0 .  oxyCODONE-acetaminophen (PERCOCET/ROXICET) 5-325 MG tablet, Take 1-2 tablets by mouth every 6 (six) hours as needed for severe pain., Disp: 20 tablet, Rfl: 0 .  potassium citrate (UROCIT-K) 5 MEQ (540 MG) SR tablet, Take 10 mEq by mouth 3 (three) times daily.,  Disp: , Rfl:  .  QUEtiapine (SEROQUEL XR) 400 MG 24 hr tablet, Take 400 mg by mouth at bedtime. , Disp: , Rfl:   Review of Systems:   Review of Systems  Constitutional: Negative for fever, chills, weight loss, malaise/fatigue and diaphoresis.  HENT: Negative for hearing loss, ear pain, nosebleeds, congestion, sore throat, neck pain, tinnitus and  ear discharge.   Eyes: Negative for blurred vision, double vision, photophobia, pain, discharge and redness.  Respiratory: Negative for cough, hemoptysis, sputum production, shortness of breath, wheezing and stridor.   Cardiovascular: Negative for chest pain, palpitations, orthopnea, claudication, leg swelling and PND.  Gastrointestinal: Positive for abdominal pain. Negative for heartburn, nausea, vomiting, diarrhea, constipation, blood in stool and melena.  Genitourinary: Negative for dysuria, urgency, frequency, hematuria and flank pain.  Musculoskeletal: Negative for myalgias, back pain, joint pain and falls.  Skin: Negative for itching and rash.  Neurological: Negative for dizziness, tingling, tremors, sensory change, speech change, focal weakness, seizures, loss of consciousness, weakness and headaches.  Endo/Heme/Allergies: Negative for environmental allergies and polydipsia. Does not bruise/bleed easily.  Psychiatric/Behavioral: Negative for depression, suicidal ideas, hallucinations, memory loss and substance abuse. The patient is not nervous/anxious and does not have insomnia.      PHYSICAL EXAM:  There were no vitals taken for this visit.    Vitals reviewed. Constitutional: She is oriented to person, place, and time. She appears well-developed and well-nourished.  HENT:  Head: Normocephalic and atraumatic.  Right Ear: External ear normal.  Left Ear: External ear normal.  Nose: Nose normal.  Mouth/Throat: Oropharynx is clear and moist.  Eyes: Conjunctivae and EOM are normal. Pupils are equal, round, and reactive to light. Right eye  exhibits no discharge. Left eye exhibits no discharge. No scleral icterus.  Neck: Normal range of motion. Neck supple. No tracheal deviation present. No thyromegaly present.  Cardiovascular: Normal rate, regular rhythm, normal heart sounds and intact distal pulses.  Exam reveals no gallop and no friction rub.   No murmur heard. Respiratory: Effort normal and breath sounds normal. No respiratory distress. She has no wheezes. She has no rales. She exhibits no tenderness.  GI: Soft. Bowel sounds are normal. She exhibits no distension and no mass. There is tenderness. There is no rebound and no guarding.  Genitourinary:       Vulva is normal without lesions Vagina is pink moist without discharge Cervix normal in appearance and pap is normal Uterus is normal size, contour, position, consistency, mobility, non-tender Adnexa is negative with normal sized ovaries by sonogram  Musculoskeletal: Normal range of motion. She exhibits no edema and no tenderness.  Neurological: She is alert and oriented to person, place, and time. She has normal reflexes. She displays normal reflexes. No cranial nerve deficit. She exhibits normal muscle tone. Coordination normal.  Skin: Skin is warm and dry. No rash noted. No erythema. No pallor.  Psychiatric: She has a normal mood and affect. Her behavior is normal. Judgment and thought content normal.    Labs: Results for orders placed or performed during the hospital encounter of 10/30/20 (from the past 336 hour(s))  CBC with Differential/Platelet   Collection Time: 10/30/20  1:54 PM  Result Value Ref Range   WBC 7.2 4.0 - 10.5 K/uL   RBC 4.45 3.87 - 5.11 MIL/uL   Hemoglobin 12.6 12.0 - 15.0 g/dL   HCT 39.8 36.0 - 46.0 %   MCV 89.4 80.0 - 100.0 fL   MCH 28.3 26.0 - 34.0 pg   MCHC 31.7 30.0 - 36.0 g/dL   RDW 15.3 11.5 - 15.5 %   Platelets 410 (H) 150 - 400 K/uL   nRBC 0.0 0.0 - 0.2 %   Neutrophils Relative % 41 %   Neutro Abs 3.0 1.7 - 7.7 K/uL   Lymphocytes  Relative 43 %   Lymphs Abs 3.1 0.7 - 4.0 K/uL  Monocytes Relative 9 %   Monocytes Absolute 0.7 0.1 - 1.0 K/uL   Eosinophils Relative 6 %   Eosinophils Absolute 0.4 0.0 - 0.5 K/uL   Basophils Relative 1 %   Basophils Absolute 0.1 0.0 - 0.1 K/uL   Immature Granulocytes 0 %   Abs Immature Granulocytes 0.03 0.00 - 0.07 K/uL  Comprehensive metabolic panel   Collection Time: 10/30/20  1:54 PM  Result Value Ref Range   Sodium 134 (L) 135 - 145 mmol/L   Potassium 4.3 3.5 - 5.1 mmol/L   Chloride 104 98 - 111 mmol/L   CO2 23 22 - 32 mmol/L   Glucose, Bld 91 70 - 99 mg/dL   BUN 19 6 - 20 mg/dL   Creatinine, Ser 1.53 (H) 0.44 - 1.00 mg/dL   Calcium 9.0 8.9 - 10.3 mg/dL   Total Protein 7.5 6.5 - 8.1 g/dL   Albumin 3.4 (L) 3.5 - 5.0 g/dL   AST 18 15 - 41 U/L   ALT 28 0 - 44 U/L   Alkaline Phosphatase 169 (H) 38 - 126 U/L   Total Bilirubin 0.3 0.3 - 1.2 mg/dL   GFR, Estimated 41 (L) >60 mL/min   Anion gap 7 5 - 15  hCG, quantitative, pregnancy (serum - [facility])   Collection Time: 10/30/20  1:54 PM  Result Value Ref Range   hCG, Beta Chain, Quant, S <1 <5 mIU/mL  Rapid HIV screen (HIV 1/2 Ab+Ag)   Collection Time: 10/30/20  1:54 PM  Result Value Ref Range   HIV-1 P24 Antigen - HIV24 NON REACTIVE NON REACTIVE   HIV 1/2 Antibodies NON REACTIVE NON REACTIVE   Interpretation (HIV Ag Ab)      A non reactive test result means that HIV 1 or HIV 2 antibodies and HIV 1 p24 antigen were not detected in the specimen.  Urinalysis, Routine w reflex microscopic   Collection Time: 10/30/20  1:55 PM  Result Value Ref Range   Color, Urine YELLOW YELLOW   APPearance HAZY (A) CLEAR   Specific Gravity, Urine 1.025 1.005 - 1.030   pH 5.0 5.0 - 8.0   Glucose, UA NEGATIVE NEGATIVE mg/dL   Hgb urine dipstick NEGATIVE NEGATIVE   Bilirubin Urine NEGATIVE NEGATIVE   Ketones, ur NEGATIVE NEGATIVE mg/dL   Protein, ur 30 (A) NEGATIVE mg/dL   Nitrite NEGATIVE NEGATIVE   Leukocytes,Ua SMALL (A) NEGATIVE   RBC /  HPF 0-5 0 - 5 RBC/hpf   WBC, UA 21-50 0 - 5 WBC/hpf   Bacteria, UA RARE (A) NONE SEEN   Squamous Epithelial / LPF 0-5 0 - 5   Mucus PRESENT     EKG: Orders placed or performed during the hospital encounter of 10/02/20  . EKG 12-Lead  . EKG 12-Lead    Imaging Studies: DG Ribs Unilateral W/Chest Left  Result Date: 10/24/2020 CLINICAL DATA:  Cough.  Left-sided chest pain. EXAM: LEFT RIBS AND CHEST - 3+ VIEW COMPARISON:  04/20/2015 FINDINGS: Heart size is normal. Mediastinal shadows are normal. The right chest is clear. There is mild atelectasis at the left lung base. No pneumothorax. Left rib films show a nondisplaced fracture of the lateral left eighth rib. IMPRESSION: Nondisplaced fracture of the left lateral eighth rib. Mild atelectasis at the left lung base. Electronically Signed   By: Nelson Chimes M.D.   On: 10/24/2020 13:29   DG C-Arm 1-60 Min-No Report  Result Date: 10/16/2020 Fluoroscopy was utilized by the requesting physician.  No radiographic interpretation.  DG C-Arm 1-60 Min-No Report  Result Date: 10/04/2020 Fluoroscopy was utilized by the requesting physician.  No radiographic interpretation.      Assessment: Suspected high grade cervical dysplasia of the cervix  Plan: Laser ablation of the cervix  Florian Buff 10/31/2020 7:35 AM

## 2020-11-01 ENCOUNTER — Encounter (HOSPITAL_COMMUNITY): Payer: Self-pay | Admitting: Obstetrics & Gynecology

## 2020-11-05 DIAGNOSIS — R809 Proteinuria, unspecified: Secondary | ICD-10-CM | POA: Diagnosis not present

## 2020-11-05 DIAGNOSIS — N200 Calculus of kidney: Secondary | ICD-10-CM | POA: Diagnosis not present

## 2020-11-05 DIAGNOSIS — N1831 Chronic kidney disease, stage 3a: Secondary | ICD-10-CM | POA: Diagnosis not present

## 2020-11-05 DIAGNOSIS — I129 Hypertensive chronic kidney disease with stage 1 through stage 4 chronic kidney disease, or unspecified chronic kidney disease: Secondary | ICD-10-CM | POA: Diagnosis not present

## 2020-11-05 DIAGNOSIS — E6609 Other obesity due to excess calories: Secondary | ICD-10-CM | POA: Diagnosis not present

## 2020-11-05 DIAGNOSIS — R82991 Hypocitraturia: Secondary | ICD-10-CM | POA: Diagnosis not present

## 2020-11-08 ENCOUNTER — Encounter: Payer: Medicare Other | Admitting: Obstetrics & Gynecology

## 2020-11-08 DIAGNOSIS — R82991 Hypocitraturia: Secondary | ICD-10-CM | POA: Diagnosis not present

## 2020-11-08 DIAGNOSIS — R809 Proteinuria, unspecified: Secondary | ICD-10-CM | POA: Diagnosis not present

## 2020-11-08 DIAGNOSIS — N1832 Chronic kidney disease, stage 3b: Secondary | ICD-10-CM | POA: Diagnosis not present

## 2020-11-08 DIAGNOSIS — I129 Hypertensive chronic kidney disease with stage 1 through stage 4 chronic kidney disease, or unspecified chronic kidney disease: Secondary | ICD-10-CM | POA: Diagnosis not present

## 2020-11-08 DIAGNOSIS — N200 Calculus of kidney: Secondary | ICD-10-CM | POA: Diagnosis not present

## 2020-11-09 ENCOUNTER — Encounter: Payer: Self-pay | Admitting: Obstetrics & Gynecology

## 2020-11-09 ENCOUNTER — Other Ambulatory Visit: Payer: Self-pay

## 2020-11-09 ENCOUNTER — Ambulatory Visit (INDEPENDENT_AMBULATORY_CARE_PROVIDER_SITE_OTHER): Payer: Medicare Other | Admitting: Obstetrics & Gynecology

## 2020-11-09 VITALS — BP 149/91 | HR 118 | Ht 62.0 in | Wt 211.0 lb

## 2020-11-09 DIAGNOSIS — Z9889 Other specified postprocedural states: Secondary | ICD-10-CM

## 2020-11-09 NOTE — Progress Notes (Signed)
HPI: Patient returns for routine postoperative follow-up having undergone laser ablation of the cervix on 10/31/20.  The patient's immediate postoperative recovery has been unremarkable. Since hospital discharge the patient reports no problems.   Current Outpatient Medications: albuterol (VENTOLIN HFA) 108 (90 Base) MCG/ACT inhaler, Inhale 1-2 puffs into the lungs every 6 (six) hours as needed for wheezing or shortness of breath., Disp: 1 each, Rfl: 0 ALPRAZolam (XANAX) 1 MG tablet, Take 1 mg by mouth 3 (three) times daily as needed for anxiety. , Disp: , Rfl:  amantadine (SYMMETREL) 100 MG capsule, Take 100 mg by mouth at bedtime., Disp: , Rfl:  amLODipine (NORVASC) 5 MG tablet, TAKE 1 TABLET BY MOUTH TWICE DAILY (Patient taking differently: Take 5 mg by mouth in the morning and at bedtime.), Disp: 60 tablet, Rfl: 3 ARIPiprazole (ABILIFY) 5 MG tablet, Take 5 mg by mouth at bedtime., Disp: , Rfl:  benzonatate (TESSALON) 100 MG capsule, Take 1 capsule (100 mg total) by mouth every 8 (eight) hours., Disp: 21 capsule, Rfl: 0 carvedilol (COREG) 25 MG tablet, Take 25 mg by mouth 2 (two) times daily., Disp: , Rfl:  Cholecalciferol (VITAMIN D3) 125 MCG (5000 UT) TABS, Take 10,000 Units by mouth daily., Disp: , Rfl:  cloNIDine (CATAPRES) 0.3 MG tablet, TAKE 1 TABLET BY MOUTH TWICE DAILY (Patient taking differently: Take 0.3 mg by mouth 2 (two) times daily.), Disp: 60 tablet, Rfl: 3 diphenhydrAMINE (BENADRYL) 25 MG tablet, Take 25 mg by mouth at bedtime. *take with pain medication to prevent itching*, Disp: , Rfl:  doxepin (SINEQUAN) 75 MG capsule, Take 225 mg by mouth at bedtime. , Disp: , Rfl:  HYDROmorphone (DILAUDID) 2 MG tablet, Take 1 tablet (2 mg total) by mouth every 4 (four) hours as needed for moderate pain or severe pain., Disp: 30 tablet, Rfl: 0 ketorolac (TORADOL) 10 MG tablet, Take 1 tablet (10 mg total) by mouth every 8 (eight) hours as needed., Disp: 15 tablet, Rfl: 0 lamoTRIgine  (LAMICTAL) 200 MG tablet, Take 200 mg by mouth in the morning., Disp: , Rfl:  losartan (COZAAR) 25 MG tablet, Take 25 mg by mouth in the morning., Disp: , Rfl:  medroxyPROGESTERone (DEPO-PROVERA) 150 MG/ML injection, Inject 1 mL (150 mg total) into the muscle every 3 (three) months., Disp: 1 mL, Rfl: 3 mirabegron ER (MYRBETRIQ) 25 MG TB24 tablet, Take 1 tablet (25 mg total) by mouth daily., Disp: 30 tablet, Rfl: 0 nitrofurantoin (MACRODANTIN) 50 MG capsule, Take 1 capsule (50 mg total) by mouth at bedtime., Disp: 90 capsule, Rfl: 0 NP THYROID 60 MG tablet, Take 1 tablet (60 mg total) by mouth daily before breakfast., Disp: 30 tablet, Rfl: 3 ondansetron (ZOFRAN ODT) 8 MG disintegrating tablet, Take 1 tablet (8 mg total) by mouth every 8 (eight) hours as needed for nausea or vomiting., Disp: 8 tablet, Rfl: 0 oxyCODONE-acetaminophen (PERCOCET) 10-325 MG tablet, Take 1 tablet by mouth every 4 (four) hours as needed for pain. (Patient taking differently: Take 1 tablet by mouth at bedtime.), Disp: 30 tablet, Rfl: 0 oxyCODONE-acetaminophen (PERCOCET/ROXICET) 5-325 MG tablet, Take 1-2 tablets by mouth every 6 (six) hours as needed for severe pain., Disp: 20 tablet, Rfl: 0 potassium citrate (UROCIT-K) 5 MEQ (540 MG) SR tablet, Take 10 mEq by mouth 3 (three) times daily., Disp: , Rfl:  QUEtiapine (SEROQUEL XR) 400 MG 24 hr tablet, Take 400 mg by mouth at bedtime. , Disp: , Rfl:   No current facility-administered medications for this visit.    Blood pressure (!) 149/91,  pulse (!) 118, height 5\' 2"  (1.575 m), weight 211 lb (95.7 kg).  Physical Exam: General WDWN female NAD Vulva:  normal appearing vulva with no masses, tenderness or lesions Vagina:  normal mucosa, no discharge Cervix: s/p laser of cervix, healing normally, normal post op exam    Diagnostic Tests:   Pathology: N/a  Impression:   ICD-10-CM   1. S/P laser ablation of the cervix, 10/31/20 for HSIL  Z98.890         Plan: HPV  based cytology surveillance every 6 months for 2 years    Follow up: Return in about 6 months (around 05/11/2021) for Follow up Pap.    Florian Buff, MD

## 2020-11-21 DIAGNOSIS — I10 Essential (primary) hypertension: Secondary | ICD-10-CM | POA: Diagnosis not present

## 2020-11-21 DIAGNOSIS — W1839XA Other fall on same level, initial encounter: Secondary | ICD-10-CM | POA: Diagnosis not present

## 2020-11-21 DIAGNOSIS — Z87828 Personal history of other (healed) physical injury and trauma: Secondary | ICD-10-CM | POA: Diagnosis not present

## 2020-11-21 DIAGNOSIS — S9031XA Contusion of right foot, initial encounter: Secondary | ICD-10-CM | POA: Diagnosis not present

## 2020-11-21 DIAGNOSIS — M7989 Other specified soft tissue disorders: Secondary | ICD-10-CM | POA: Diagnosis not present

## 2020-11-21 DIAGNOSIS — S92354A Nondisplaced fracture of fifth metatarsal bone, right foot, initial encounter for closed fracture: Secondary | ICD-10-CM | POA: Diagnosis not present

## 2020-11-24 ENCOUNTER — Other Ambulatory Visit (INDEPENDENT_AMBULATORY_CARE_PROVIDER_SITE_OTHER): Payer: Self-pay | Admitting: Internal Medicine

## 2020-11-26 ENCOUNTER — Ambulatory Visit (INDEPENDENT_AMBULATORY_CARE_PROVIDER_SITE_OTHER): Payer: Medicare Other

## 2020-11-26 ENCOUNTER — Other Ambulatory Visit: Payer: Self-pay

## 2020-11-26 NOTE — Progress Notes (Deleted)
Subjective:   [name] is a 50 y.o. female who presents for Medicare Annual (Subsequent) preventive examination.  I connected with [employer] today by telephone and verified that I am speaking with the correct person using two identifiers. Location patient: home Location provider: work Persons participating in the virtual visit: patient, provider.   I discussed the limitations, risks, security and privacy concerns of performing an evaluation and management service by telephone and the availability of in person appointments. I also discussed with the patient that there may be a patient responsible charge related to this service. The patient expressed understanding and verbally consented to this telephonic visit.    Interactive audio and video telecommunications were attempted between this provider and patient, however failed, due to patient having technical difficulties OR patient did not have access to video capability.  We continued and completed visit with audio only.     Review of Systems    N/A        Objective:    There were no vitals filed for this visit. There is no height or weight on file to calculate BMI.  Advanced Directives 10/30/2020 10/24/2020 10/16/2020 10/15/2020 10/04/2020 10/02/2020 09/16/2019  Does Patient Have a Medical Advance Directive? No No No No No No No  Would patient like information on creating a medical advance directive? No - Patient declined No - Patient declined No - Patient declined No - Patient declined No - Patient declined No - Patient declined No - Patient declined    Current Medications (verified) Outpatient Encounter Medications as of 11/26/2020  Medication Sig   albuterol (VENTOLIN HFA) 108 (90 Base) MCG/ACT inhaler Inhale 1-2 puffs into the lungs every 6 (six) hours as needed for wheezing or shortness of breath.   ALPRAZolam (XANAX) 1 MG tablet Take 1 mg by mouth 3 (three) times daily as needed for anxiety.    amantadine (SYMMETREL) 100 MG  capsule Take 100 mg by mouth at bedtime.   amLODipine (NORVASC) 5 MG tablet TAKE 1 TABLET BY MOUTH TWICE DAILY (Patient taking differently: Take 5 mg by mouth in the morning and at bedtime.)   ARIPiprazole (ABILIFY) 5 MG tablet Take 5 mg by mouth at bedtime.   benzonatate (TESSALON) 100 MG capsule Take 1 capsule (100 mg total) by mouth every 8 (eight) hours.   carvedilol (COREG) 25 MG tablet Take 25 mg by mouth 2 (two) times daily.   Cholecalciferol (VITAMIN D3) 125 MCG (5000 UT) TABS Take 10,000 Units by mouth daily.   cloNIDine (CATAPRES) 0.3 MG tablet TAKE 1 TABLET BY MOUTH TWICE DAILY (Patient taking differently: Take 0.3 mg by mouth 2 (two) times daily.)   diphenhydrAMINE (BENADRYL) 25 MG tablet Take 25 mg by mouth at bedtime. *take with pain medication to prevent itching*   doxepin (SINEQUAN) 75 MG capsule Take 225 mg by mouth at bedtime.    HYDROmorphone (DILAUDID) 2 MG tablet Take 1 tablet (2 mg total) by mouth every 4 (four) hours as needed for moderate pain or severe pain.   ketorolac (TORADOL) 10 MG tablet Take 1 tablet (10 mg total) by mouth every 8 (eight) hours as needed.   lamoTRIgine (LAMICTAL) 200 MG tablet Take 200 mg by mouth in the morning.   losartan (COZAAR) 25 MG tablet Take 25 mg by mouth in the morning.   medroxyPROGESTERone (DEPO-PROVERA) 150 MG/ML injection Inject 1 mL (150 mg total) into the muscle every 3 (three) months.   mirabegron ER (MYRBETRIQ) 25 MG TB24 tablet Take  1 tablet (25 mg total) by mouth daily.   nitrofurantoin (MACRODANTIN) 50 MG capsule Take 1 capsule (50 mg total) by mouth at bedtime.   NP THYROID 60 MG tablet TAKE 1 TABLET (60 MG TOTAL) BY MOUTH DAILY BEFORE BREAKFAST.   ondansetron (ZOFRAN ODT) 8 MG disintegrating tablet Take 1 tablet (8 mg total) by mouth every 8 (eight) hours as needed for nausea or vomiting.   oxyCODONE-acetaminophen (PERCOCET) 10-325 MG tablet Take 1 tablet by mouth every 4 (four) hours as needed for pain. (Patient taking  differently: Take 1 tablet by mouth at bedtime.)   oxyCODONE-acetaminophen (PERCOCET/ROXICET) 5-325 MG tablet Take 1-2 tablets by mouth every 6 (six) hours as needed for severe pain.   potassium citrate (UROCIT-K) 5 MEQ (540 MG) SR tablet Take 10 mEq by mouth 3 (three) times daily.   QUEtiapine (SEROQUEL XR) 400 MG 24 hr tablet Take 400 mg by mouth at bedtime.    No facility-administered encounter medications on file as of 11/26/2020.    Allergies (verified) Morphine and related, Sulfa antibiotics, and Tape   History: Past Medical History:  Diagnosis Date   Anxiety    Chronic kidney disease    CKD (chronic kidney disease) stage 3, GFR 30-59 ml/min (HCC) 03/15/2019   Depression    Diarrhea 05/04/2017   Essential hypertension, benign 03/15/2019   Hypertension    Hypothyroidism    Hypothyroidism, adult 03/15/2019   Kidney stones    LGSIL on Pap smear of cervix 08/17/2020   Needs colpo per [facility] immediate risk is 4.3 for CIN 3+risk    Obesity (BMI 30-39.9) 03/15/2019   Renal disorder    Vitamin D deficiency disease 03/15/2019   Past Surgical History:  Procedure Laterality Date   BIOPSY  06/05/2017   Procedure: BIOPSY;  Surgeon: Rogene Houston, MD;  Location: [facility];  Service: Endoscopy;;  colon   CHOLECYSTECTOMY     COLONOSCOPY N/A 06/05/2017   Procedure: COLONOSCOPY;  Surgeon: Rogene Houston, MD;  Location: [facility];  Service: Endoscopy;  Laterality: N/A;  8:30   CYSTOSCOPY WITH RETROGRADE PYELOGRAM, URETEROSCOPY AND STENT PLACEMENT Left 10/04/2020   Procedure: CYSTOSCOPY WITH LEFT RETROGRADE PYELOGRAM, DIAGNOSTIC LEFT URETEROSCOPY AND LEFT URETEREAL STENT PLACEMENT;  Surgeon: Cleon Gustin, MD;  Location: [facility];  Service: Urology;  Laterality: Left;   CYSTOSCOPY WITH RETROGRADE PYELOGRAM, URETEROSCOPY AND STENT PLACEMENT Left 10/16/2020   Procedure: CYSTOSCOPY WITH LEFT RETROGRADE PYELOGRAM, LEFT URETEROSCOPY  WITH LASER AND LEFT URETERAL STENT EXCHANGE;  Surgeon:  Cleon Gustin, MD;  Location: [facility];  Service: Urology;  Laterality: Left;   HOLMIUM LASER APPLICATION Left 1/60/7371   Procedure: HOLMIUM LASER APPLICATION;  Surgeon: Cleon Gustin, MD;  Location: [facility];  Service: Urology;  Laterality: Left;   KIDNEY STONE SURGERY     kidney stones     LASER ABLATION CONDOLAMATA N/A 10/31/2020   Procedure: LASER ABLATION OF THE CERVIX;  Surgeon: Florian Buff, MD;  Location: [facility];  Service: Gynecology;  Laterality: N/A;   Family History  Problem Relation Age of Onset   Cancer Father    Heart failure Father    Cancer Paternal Grandmother    Heart failure Paternal Grandmother    Cancer Maternal Grandmother    Colon cancer Neg Hx    Social History   Socioeconomic History   Marital status: Divorced    Spouse name: Not on file   Number of children: Not on file   Years of education:  Not on file   Highest education level: Not on file  Occupational History   Not on file  Tobacco Use   Smoking status: Passive Smoke Exposure - Never Smoker   Smokeless tobacco: Never  Vaping Use   Vaping Use: Never used  Substance and Sexual Activity   Alcohol use: No   Drug use: No   Sexual activity: Not Currently    Birth control/protection: Injection  Other Topics Concern   Not on file  Social History Narrative   Divorced twice,1st lasted 5 years,2nd 4 years.On disabilty secondary to mental illness since 2004.Previously used to [employer]Lives alone,has 2 cats.   Social Determinants of Health   Financial Resource Strain: Medium Risk   Difficulty of Paying Living Expenses: Somewhat hard  Food Insecurity: No Food Insecurity   Worried About Charity fundraiser in the Last Year: Never true   Ran Out of Food in the Last Year: Never true  Transportation Needs: No Transportation Needs   Lack of Transportation (Medical): No   Lack of Transportation (Non-Medical): No  Physical Activity: Insufficiently Active   Days of Exercise per  Week: 2 days   Minutes of Exercise per Session: 10 min  Stress: Stress Concern Present   Feeling of Stress : To some extent  Social Connections: Socially Isolated   Frequency of Communication with Friends and Family: More than three times a week   Frequency of Social Gatherings with Friends and Family: Three times a week   Attends Religious Services: Never   Active Member of Clubs or Organizations: No   Attends Archivist Meetings: Never   Marital Status: Divorced    Tobacco Counseling Counseling given: Not Answered   Clinical Intake:                 Diabetic?No          Activities of Daily Living In your present state of health, do you have any difficulty performing the following activities: 10/30/2020 10/15/2020  Hearing? N N  Vision? N N  Difficulty concentrating or making decisions? N N  Walking or climbing stairs? N N  Comment - -  Dressing or bathing? N N  Doing errands, shopping? N N  Some recent data might be hidden    Patient Care Team: Doree Albee, MD as PCP - General (Internal Medicine)  Indicate any recent Medical Services you may have received from other than [facility] providers in the past year (date may be approximate).     Assessment:   This is a routine wellness examination for Jamileth.  Hearing/Vision screen No results found.  Dietary issues and exercise activities discussed:     Goals Addressed   None    Depression Screen PHQ 2/9 Scores 08/06/2020 07/04/2020 09/16/2019  PHQ - 2 Score 5 0 0  PHQ- 9 Score 22 1 -    Fall Risk Fall Risk  08/06/2020 09/16/2019  Falls in the past year? 0 0  Number falls in past yr: 0 0  Injury with Fall? 0 0  Follow up Falls evaluation completed Education provided;Falls prevention discussed;Falls evaluation completed    FALL RISK PREVENTION PERTAINING TO THE HOME:  Any stairs in or around the home? {YES/NO:21197} If so, are there any without handrails? Yes  Home free of loose throw rugs  in walkways, pet beds, electrical cords, etc? Yes  Adequate lighting in your home to reduce risk of falls? Yes   ASSISTIVE DEVICES UTILIZED TO PREVENT FALLS:  Life alert? {YES/NO:21197} Use of a cane, walker or w/c? {YES/NO:21197} Grab bars in the bathroom? {YES/NO:21197} Shower chair or bench in shower? {YES/NO:21197} Elevated toilet seat or a handicapped toilet? {YES/NO:21197}    Cognitive Function:     6CIT Screen 09/16/2019  What Year? 0 points  What month? 0 points  What time? 0 points  Count back from 20 0 points  Months in reverse 0 points  Repeat phrase 2 points  Total Score 2    Immunizations Immunization History  Administered Date(s) Administered   Influenza Split 04/16/2017   Influenza,inj,Quad PF,6+ Mos 03/15/2019, 04/03/2020   Moderna Sars-Covid-2 Vaccination 09/15/2019, 10/15/2019, 05/16/2020   Rabies, IM 12/26/2008, 12/29/2008, 01/02/2009, 01/09/2009   Tdap 12/26/2008    TDAP status: Due, Education has been provided regarding the importance of this vaccine. Advised may receive this vaccine at local pharmacy or Health Dept. Aware to provide a copy of the vaccination record if obtained from local pharmacy or Health Dept. Verbalized acceptance and understanding.  Flu Vaccine status: Up to date  Pneumococcal vaccine status: Up to date  Covid-19 vaccine status: Completed vaccines  Qualifies for Shingles Vaccine? No   Zostavax completed No   Shingrix Completed?: No.    Education has been provided regarding the importance of this vaccine. Patient has been advised to call insurance company to determine out of pocket expense if they have not yet received this vaccine. Advised may also receive vaccine at local pharmacy or Health Dept. Verbalized acceptance and understanding.  Screening Tests Health Maintenance  Topic Date Due   Pneumococcal Vaccine 74-24 Years old (1 - PCV) Never done   HIV Screening  Never done   MAMMOGRAM  Never done   Hepatitis C Screening   Never done   TETANUS/TDAP  12/27/2018   COVID-19 Vaccine (4 - Booster for Moderna series) 08/14/2020   INFLUENZA VACCINE  12/31/2020   COLONOSCOPY (Pts 45-42yrs Insurance coverage will need to be confirmed)  06/05/2022   PAP SMEAR-Modifier  08/07/2023   HPV VACCINES  Aged Out    Health Maintenance  Health Maintenance Due  Topic Date Due   Pneumococcal Vaccine 81-16 Years old (1 - PCV) Never done   HIV Screening  Never done   MAMMOGRAM  Never done   Hepatitis C Screening  Never done   TETANUS/TDAP  12/27/2018   COVID-19 Vaccine (4 - Booster for Moderna series) 08/14/2020    Colorectal cancer screening: Type of screening: Colonoscopy. Completed 06/05/2017. Repeat every 5 years  Mammogram status: Ordered 08/06/2020. Pt provided with contact info and advised to call to schedule appt.   Bone Density Screening: Not due until age 31  Lung Cancer Screening: (Low Dose CT Chest recommended if Age 35-80 years, 30 pack-year currently smoking OR have quit w/in 15years.) does not qualify.   Lung Cancer Screening Referral: N/A   Additional Screening:  Hepatitis C Screening: does qualify;    Vision Screening: Recommended annual ophthalmology exams for early detection of glaucoma and other disorders of the eye. Is the patient up to date with their annual eye exam?  {YES/NO:21197} Who is the provider or what is the name of the office in which the patient attends annual eye exams? *** If pt is not established with a provider, would they like to be referred to a provider to establish care? {YES/NO:21197}.   Dental Screening: Recommended annual dental exams for proper oral hygiene  Community Resource Referral / Chronic Care Management: CRR required this visit?  No   CCM  required this visit?  No      Plan:     I have personally reviewed and noted the following in the patient's chart:   Medical and social history Use of alcohol, tobacco or illicit drugs  Current medications and  supplements including opioid prescriptions.  Functional ability and status Nutritional status Physical activity Advanced directives List of other physicians Hospitalizations, surgeries, and ER visits in previous 12 months Vitals Screenings to include cognitive, depression, and falls Referrals and appointments  In addition, I have reviewed and discussed with patient certain preventive protocols, quality metrics, and best practice recommendations. A written personalized care plan for preventive services as well as general preventive health recommendations were provided to patient.     Ofilia Neas, LPN   9/74/1638   Nurse Notes: None

## 2020-11-27 DIAGNOSIS — W1839XA Other fall on same level, initial encounter: Secondary | ICD-10-CM | POA: Diagnosis not present

## 2020-11-27 DIAGNOSIS — S92354A Nondisplaced fracture of fifth metatarsal bone, right foot, initial encounter for closed fracture: Secondary | ICD-10-CM | POA: Diagnosis not present

## 2020-12-04 ENCOUNTER — Other Ambulatory Visit (INDEPENDENT_AMBULATORY_CARE_PROVIDER_SITE_OTHER): Payer: Self-pay | Admitting: Internal Medicine

## 2020-12-07 ENCOUNTER — Other Ambulatory Visit: Payer: Self-pay

## 2020-12-07 ENCOUNTER — Ambulatory Visit (HOSPITAL_COMMUNITY)
Admission: RE | Admit: 2020-12-07 | Discharge: 2020-12-07 | Disposition: A | Discharge status: Home / Self Care | Payer: Medicare Other | Source: Ambulatory Visit | Attending: Urology | Admitting: Urology

## 2020-12-07 DIAGNOSIS — N200 Calculus of kidney: Secondary | ICD-10-CM | POA: Diagnosis not present

## 2020-12-08 ENCOUNTER — Encounter: Payer: Self-pay | Admitting: Urology

## 2020-12-14 ENCOUNTER — Encounter: Payer: Self-pay | Admitting: Urology

## 2020-12-14 ENCOUNTER — Ambulatory Visit (INDEPENDENT_AMBULATORY_CARE_PROVIDER_SITE_OTHER): Payer: Medicare Other | Admitting: Urology

## 2020-12-14 ENCOUNTER — Other Ambulatory Visit: Payer: Self-pay

## 2020-12-14 VITALS — BP 131/86 | HR 93 | Temp 98.1°F

## 2020-12-14 DIAGNOSIS — N200 Calculus of kidney: Secondary | ICD-10-CM | POA: Diagnosis not present

## 2020-12-14 DIAGNOSIS — N3021 Other chronic cystitis with hematuria: Secondary | ICD-10-CM

## 2020-12-14 LAB — MICROSCOPIC EXAMINATION: Renal Epithel, UA: NONE SEEN /hpf

## 2020-12-14 LAB — URINALYSIS, ROUTINE W REFLEX MICROSCOPIC
Bilirubin, UA: NEGATIVE
Glucose, UA: NEGATIVE
Ketones, UA: NEGATIVE
Nitrite, UA: POSITIVE — AB
Specific Gravity, UA: >1.03 — ABNORMAL HIGH (ref 1.005–1.030)
Urobilinogen, Ur: 0.2 mg/dL (ref 0.2–1.0)
pH, UA: 6 (ref 5.0–7.5)

## 2020-12-14 MED ORDER — CYCLOBENZAPRINE HCL 5 MG PO TABS: 5.0000 mg | ORAL_TABLET | Freq: Three times a day (TID) | ORAL | 0 refills | Status: DC | PRN

## 2020-12-14 MED ORDER — CEFUROXIME AXETIL 500 MG PO TABS: 500.0000 mg | ORAL_TABLET | Freq: Two times a day (BID) | ORAL | 0 refills | Status: DC

## 2020-12-14 MED ORDER — NITROFURANTOIN MACROCRYSTAL 50 MG PO CAPS: 50.0000 mg | ORAL_CAPSULE | Freq: Every day | ORAL | 3 refills | Status: DC

## 2020-12-14 NOTE — Progress Notes (Signed)
Urological Symptom Review  Patient is experiencing the following symptoms: Hard to postpone urination Get up at night to urinate Leakage of urine Blood in urine Urinary tract infection Sexually transmitted disease-HPV   Review of Systems  Gastrointestinal (upper)  : Negative for upper GI symptoms  Gastrointestinal (lower) : Negative for lower GI symptoms  Constitutional : Fatigue  Skin: Negative for skin symptoms  Eyes: Blurred vision  Ear/Nose/Throat : Negative for Ear/Nose/Throat symptoms  Hematologic/Lymphatic: Negative for Hematologic/Lymphatic symptoms  Cardiovascular : Negative for cardiovascular symptoms  Respiratory : Shortness of breath  Endocrine: Negative for endocrine symptoms  Musculoskeletal: Negative for musculoskeletal symptoms  Neurological: Dizziness  Psychologic: Depression Anxiety

## 2020-12-17 DIAGNOSIS — J181 Lobar pneumonia, unspecified organism: Secondary | ICD-10-CM | POA: Diagnosis not present

## 2020-12-17 DIAGNOSIS — J9811 Atelectasis: Secondary | ICD-10-CM | POA: Diagnosis not present

## 2020-12-17 DIAGNOSIS — I10 Essential (primary) hypertension: Secondary | ICD-10-CM | POA: Diagnosis not present

## 2020-12-17 DIAGNOSIS — N200 Calculus of kidney: Secondary | ICD-10-CM | POA: Diagnosis not present

## 2020-12-17 DIAGNOSIS — J189 Pneumonia, unspecified organism: Secondary | ICD-10-CM | POA: Diagnosis not present

## 2020-12-17 DIAGNOSIS — F319 Bipolar disorder, unspecified: Secondary | ICD-10-CM | POA: Diagnosis not present

## 2020-12-17 DIAGNOSIS — N261 Atrophy of kidney (terminal): Secondary | ICD-10-CM | POA: Diagnosis not present

## 2020-12-17 DIAGNOSIS — N390 Urinary tract infection, site not specified: Secondary | ICD-10-CM | POA: Diagnosis not present

## 2020-12-17 DIAGNOSIS — Z9049 Acquired absence of other specified parts of digestive tract: Secondary | ICD-10-CM | POA: Diagnosis not present

## 2020-12-17 DIAGNOSIS — J90 Pleural effusion, not elsewhere classified: Secondary | ICD-10-CM | POA: Diagnosis not present

## 2020-12-17 LAB — URINE CULTURE

## 2020-12-20 NOTE — Progress Notes (Signed)
Called patient. Pt notified.

## 2020-12-25 DIAGNOSIS — Z1231 Encounter for screening mammogram for malignant neoplasm of breast: Secondary | ICD-10-CM | POA: Diagnosis not present

## 2020-12-29 NOTE — Patient Instructions (Signed)

## 2020-12-29 NOTE — Progress Notes (Signed)
12/14/2020 11:01 PM   [name] [birth year] [id number]  Referring provider: Doree Albee, MD [address] [place],  [place] [address]  dysuria   HPI: [name] is a 50yo here for followup for nephrolithiasis. She denies any flank pain. Renal US from 7/9 shows a left lower pole calculus. For the past 6-7 days she has noted worsening dysuria, urinary urgency, urinary frequency. UA is concerning for infection.    PMH: Past Medical History:  Diagnosis Date   Anxiety    Chronic kidney disease    CKD (chronic kidney disease) stage 3, GFR 30-59 ml/min (HCC) 03/15/2019   Depression    Diarrhea 05/04/2017   Essential hypertension, benign 03/15/2019   Hypertension    Hypothyroidism    Hypothyroidism, adult 03/15/2019   Kidney stones    LGSIL on Pap smear of cervix 08/17/2020   Needs colpo per [facility] immediate risk is 4.3 for CIN 3+risk    Obesity (BMI 30-39.9) 03/15/2019   Renal disorder    Vitamin D deficiency disease 03/15/2019    Surgical History: Past Surgical History:  Procedure Laterality Date   BIOPSY  06/05/2017   Procedure: BIOPSY;  Surgeon: Sabrina Houston, MD;  [facility]: [facility];  Service: Endoscopy;;  colon   CHOLECYSTECTOMY     COLONOSCOPY N/A 06/05/2017   Procedure: COLONOSCOPY;  Surgeon: Sabrina Houston, MD;  Location: [facility];  Service: Endoscopy;  Laterality: N/A;  8:30   CYSTOSCOPY WITH RETROGRADE PYELOGRAM, URETEROSCOPY AND STENT PLACEMENT Left 10/04/2020   Procedure: CYSTOSCOPY WITH LEFT RETROGRADE PYELOGRAM, DIAGNOSTIC LEFT URETEROSCOPY AND LEFT URETEREAL STENT PLACEMENT;  Surgeon: Sabrina Gustin, MD;  Location: [facility];  Service: Urology;  Laterality: Left;   CYSTOSCOPY WITH RETROGRADE PYELOGRAM, URETEROSCOPY AND STENT PLACEMENT Left 10/16/2020   Procedure: CYSTOSCOPY WITH LEFT RETROGRADE PYELOGRAM, LEFT URETEROSCOPY  WITH LASER AND LEFT URETERAL STENT EXCHANGE;  Surgeon: Sabrina Gustin, MD;  Location: [facility];  Service: Urology;   Laterality: Left;   HOLMIUM LASER APPLICATION Left [id number]   Procedure: HOLMIUM LASER APPLICATION;  Surgeon: Sabrina Gustin, MD;  Location: [facility];  Service: Urology;  Laterality: Left;   KIDNEY STONE SURGERY     kidney stones     LASER ABLATION CONDOLAMATA N/A 10/31/2020   Procedure: LASER ABLATION OF THE CERVIX;  Surgeon: Sabrina Buff, MD;  Location: [facility];  Service: Gynecology;  Laterality: N/A;    Home Medications:  Allergies as of 12/14/2020       Reactions   Morphine And Related Itching, Hives   Sulfa Antibiotics Swelling   Tape Itching, Other (See Comments)   Surgical         Medication List        Accurate as of December 14, 2020 11:59 PM. If you have any questions, ask your nurse or doctor.          albuterol 108 (90 Base) MCG/ACT inhaler Commonly known as: VENTOLIN HFA Inhale 1-2 puffs into the lungs every 6 (six) hours as needed for wheezing or shortness of breath.   ALPRAZolam 1 MG tablet Commonly known as: XANAX Take 1 mg by mouth 3 (three) times daily as needed for anxiety.   amantadine 100 MG capsule Commonly known as: SYMMETREL Take 100 mg by mouth at bedtime.   amLODipine 5 MG tablet Commonly known as: NORVASC Take 1 tablet (5 mg total) by mouth in the morning and at bedtime.   ARIPiprazole 5 MG tablet Commonly known as:  ABILIFY Take 5 mg by mouth at bedtime.   benzonatate 100 MG capsule Commonly known as: TESSALON Take 1 capsule (100 mg total) by mouth every 8 (eight) hours.   carvedilol 25 MG tablet Commonly known as: COREG Take 25 mg by mouth 2 (two) times daily.   cefUROXime 500 MG tablet Commonly known as: CEFTIN Take 1 tablet (500 mg total) by mouth 2 (two) times daily with a meal. Started by: Sabrina Bang, MD   cloNIDine 0.3 MG tablet Commonly known as: CATAPRES Take 1 tablet (0.3 mg total) by mouth 2 (two) times daily.   cyclobenzaprine 5 MG tablet Commonly known as: FLEXERIL Take 1 tablet (5 mg total) by mouth 3  (three) times daily as needed for muscle spasms. Started by: Sabrina Bang, MD   diphenhydrAMINE 25 MG tablet Commonly known as: BENADRYL Take 25 mg by mouth at bedtime. *take with pain medication to prevent itching*   doxepin 75 MG capsule Commonly known as: SINEQUAN Take 225 mg by mouth at bedtime.   HYDROmorphone 2 MG tablet Commonly known as: Dilaudid Take 1 tablet (2 mg total) by mouth every 4 (four) hours as needed for moderate pain or severe pain.   ketorolac 10 MG tablet Commonly known as: TORADOL Take 1 tablet (10 mg total) by mouth every 8 (eight) hours as needed.   lamoTRIgine 200 MG tablet Commonly known as: LAMICTAL Take 200 mg by mouth in the morning.   losartan 25 MG tablet Commonly known as: COZAAR Take 25 mg by mouth in the morning.   medroxyPROGESTERone 150 MG/ML injection Commonly known as: DEPO-PROVERA Inject 1 mL (150 mg total) into the muscle every 3 (three) months.   mirabegron ER 25 MG Tb24 tablet Commonly known as: MYRBETRIQ Take 1 tablet (25 mg total) by mouth daily.   nitrofurantoin 50 MG capsule Commonly known as: MACRODANTIN Take 1 capsule (50 mg total) by mouth at bedtime.   NP Thyroid 60 MG tablet Generic drug: thyroid TAKE 1 TABLET (60 MG TOTAL) BY MOUTH DAILY BEFORE BREAKFAST.   ondansetron 8 MG disintegrating tablet Commonly known as: Zofran ODT Take 1 tablet (8 mg total) by mouth every 8 (eight) hours as needed for nausea or vomiting.   oxyCODONE-acetaminophen 10-325 MG tablet Commonly known as: Percocet Take 1 tablet by mouth every 4 (four) hours as needed for pain. What changed: when to take this   oxyCODONE-acetaminophen 5-325 MG tablet Commonly known as: PERCOCET/ROXICET Take 1-2 tablets by mouth every 6 (six) hours as needed for severe pain. What changed: Another medication with the same name was changed. Make sure you understand how and when to take each.   potassium citrate 5 MEQ (540 MG) SR tablet Commonly known  as: UROCIT-K Take 10 mEq by mouth 3 (three) times daily.   QUEtiapine 400 MG 24 hr tablet Commonly known as: SEROQUEL XR Take 400 mg by mouth at bedtime.   Vitamin D3 125 MCG (5000 UT) Tabs Take 10,000 Units by mouth daily.        Allergies:  Allergies  Allergen Reactions   Morphine And Related Itching and Hives   Sulfa Antibiotics Swelling   Tape Itching and Other (See Comments)    Surgical     Family History: Family History  Problem Relation Age of Onset   Cancer Father    Heart failure Father    Cancer Paternal Grandmother    Heart failure Paternal Grandmother    Cancer Maternal Grandmother    Colon cancer Neg Hx  Social History:  reports that she is a non-smoker but has been exposed to tobacco smoke. She has never used smokeless tobacco. She reports that she does not drink alcohol and does not use drugs.  ROS: All other review of systems were reviewed and are negative except what is noted above in HPI  Physical Exam: BP 131/86   Pulse 93   Temp 98.1 F (36.7 C)   Constitutional:  Alert and oriented, No acute distress. HEENT: [place] AT, moist mucus membranes.  Trachea midline, no masses. Cardiovascular: No clubbing, cyanosis, or edema. Respiratory: Normal respiratory effort, no increased work of breathing. GI: Abdomen is soft, nontender, nondistended, no abdominal masses GU: No CVA tenderness.  Lymph: No cervical or inguinal lymphadenopathy. Skin: No rashes, bruises or suspicious lesions. Neurologic: Grossly intact, no focal deficits, moving all 4 extremities. Psychiatric: Normal mood and affect.  Laboratory Data: Lab Results  Component Value Date   WBC 7.2 10/30/2020   HGB 12.6 10/30/2020   HCT 39.8 10/30/2020   MCV 89.4 10/30/2020   PLT 410 (H) 10/30/2020    Lab Results  Component Value Date   CREATININE 1.53 (H) 10/30/2020    No results found for: PSA  No results found for: TESTOSTERONE  Lab Results  Component Value Date   HGBA1C 5.1  04/03/2020    Urinalysis    Component Value Date/Time   COLORURINE YELLOW 10/30/2020 1355   APPEARANCEUR Cloudy (A) 12/14/2020 1233   LABSPEC 1.025 10/30/2020 1355   PHURINE 5.0 10/30/2020 1355   GLUCOSEU Negative 12/14/2020 1233   HGBUR NEGATIVE 10/30/2020 1355   BILIRUBINUR Negative 12/14/2020 1233   KETONESUR NEGATIVE 10/30/2020 1355   PROTEINUR 1+ (A) 12/14/2020 1233   PROTEINUR 30 (A) 10/30/2020 1355   UROBILINOGEN 0.2 01/17/2009 0124   NITRITE Positive (A) 12/14/2020 1233   NITRITE NEGATIVE 10/30/2020 1355   LEUKOCYTESUR 1+ (A) 12/14/2020 1233   LEUKOCYTESUR SMALL (A) 10/30/2020 1355    Lab Results  Component Value Date   LABMICR See below: 12/14/2020   WBCUA 11-30 (A) 12/14/2020   LABEPIT 0-10 12/14/2020   MUCUS Present 12/14/2020   BACTERIA Many (A) 12/14/2020    Pertinent Imaging: Renal US 12/07/2020: Images reviewed and discussed with the patient Results for orders placed during the hospital encounter of 08/28/20  Abdomen 1 view (KUB)  Narrative CLINICAL DATA:  Nephrolithiasis  EXAM: ABDOMEN - 1 VIEW  COMPARISON:  CT 08/21/2020  FINDINGS: The bowel gas pattern is normal. 8 mm stone again seen projecting over the left renal shadow. No right sided renal calculi, which is partially obscured by overlying bowel gas and stool. Moderate volume stool throughout the colon. No acute osseous findings. Other significant radiographic abnormality are seen.  IMPRESSION: 8 mm stone projecting over the left renal shadow, similar to prior exam.   Electronically Signed By: Davina Poke D.O. On: 08/29/2020 10:41  No results found for this or any previous visit.  No results found for this or any previous visit.  No results found for this or any previous visit.  Results for orders placed during the hospital encounter of 12/07/20  Ultrasound renal complete  Narrative CLINICAL DATA:  Nephrolithiasis  EXAM: RENAL / URINARY TRACT ULTRASOUND  COMPLETE  COMPARISON:  Ultrasound 05/24/2020, radiograph 08/28/2020, CT 03/21/2019  FINDINGS: Right Kidney:  Renal measurements: 12.9 x 6.6 x 5.9 cm = volume: 261.7 mL. Cortex appears echogenic. Decreased corticomedullary differentiation. No hydronephrosis.  Left Kidney:  Renal measurements: 10.4 x 6.3 x 4.2 cm = volume:  144.9 mL. Cortex appears echogenic. No hydronephrosis. There appears to be cortical atrophy. 16 mm shadowing stone at the lower pole of the left kidney.  Bladder:  Appears normal for degree of bladder distention. Possible stone within the left posterior bladder measuring 8 mm  Other:  None.  IMPRESSION: 1. Kidneys are slightly echogenic consistent with medical renal disease. No hydronephrosis. Mild atrophy of left kidney 2. Left kidney stone.  Probable stone within the bladder.   Electronically Signed By: Donavan Foil M.D. On: 12/07/2020 23:55  No results found for this or any previous visit.  No results found for this or any previous visit.  Results for orders placed in visit on 08/21/20  CT RENAL STONE STUDY  Narrative CLINICAL DATA:  Left flank pain for 3 months. Hematuria. Nephrolithiasis.  EXAM: CT ABDOMEN AND PELVIS WITHOUT CONTRAST  TECHNIQUE: Multidetector CT imaging of the abdomen and pelvis was performed following the standard protocol without IV contrast.  COMPARISON:  03/21/2019  FINDINGS: Lower chest: No acute findings.  Hepatobiliary: No mass visualized on this unenhanced exam. Prior cholecystectomy. No evidence of biliary obstruction.  Pancreas: No mass or inflammatory process visualized on this unenhanced exam.  Spleen:  Within normal limits in size.  Adrenals/Urinary tract: Moderate diffuse left renal parenchymal atrophy and scarring is again seen. A few renal calculi are seen bilaterally, largest in the lower pole of the left kidney measuring 8 mm. No evidence of ureteral calculi or  hydronephrosis. Unremarkable unopacified urinary bladder.  Stomach/Bowel: No evidence of obstruction, inflammatory process, or abnormal fluid collections. Normal appendix visualized.  Vascular/Lymphatic: No pathologically enlarged lymph nodes identified. No evidence of abdominal aortic aneurysm.  Reproductive:  No mass or other significant abnormality.  Other:  None.  Musculoskeletal:  No suspicious bone lesions identified.  IMPRESSION: Bilateral nephrolithiasis. No evidence of ureteral calculi, hydronephrosis, or other acute findings.  Chronic diffuse left renal parenchymal atrophy and scarring.   Electronically Signed By: Marlaine Hind M.D. On: 08/21/2020 16:45   Assessment & Plan:    1. Nephrolithiasis RTC 3 months with a renal US - Urinalysis, Routine w reflex microscopic - Ultrasound renal complete; Future  2. Chronic cystitis with hematuria -Ceftin '500mg'$  BID for 7 days - Urine Culture   Return in about 3 months (around 03/16/2021) for renal US.  Sabrina Bang, MD  [facility] [place]

## 2021-01-03 ENCOUNTER — Ambulatory Visit (INDEPENDENT_AMBULATORY_CARE_PROVIDER_SITE_OTHER): Payer: Medicare Other | Admitting: Nurse Practitioner

## 2021-01-22 DIAGNOSIS — N1832 Chronic kidney disease, stage 3b: Secondary | ICD-10-CM | POA: Diagnosis not present

## 2021-01-22 DIAGNOSIS — I129 Hypertensive chronic kidney disease with stage 1 through stage 4 chronic kidney disease, or unspecified chronic kidney disease: Secondary | ICD-10-CM | POA: Diagnosis not present

## 2021-01-22 DIAGNOSIS — R809 Proteinuria, unspecified: Secondary | ICD-10-CM | POA: Diagnosis not present

## 2021-01-22 DIAGNOSIS — N200 Calculus of kidney: Secondary | ICD-10-CM | POA: Diagnosis not present

## 2021-01-22 DIAGNOSIS — R82991 Hypocitraturia: Secondary | ICD-10-CM | POA: Diagnosis not present

## 2021-01-30 DIAGNOSIS — N200 Calculus of kidney: Secondary | ICD-10-CM | POA: Diagnosis not present

## 2021-01-30 DIAGNOSIS — R82991 Hypocitraturia: Secondary | ICD-10-CM | POA: Diagnosis not present

## 2021-01-30 DIAGNOSIS — F4320 Adjustment disorder, unspecified: Secondary | ICD-10-CM | POA: Diagnosis not present

## 2021-01-30 DIAGNOSIS — R809 Proteinuria, unspecified: Secondary | ICD-10-CM | POA: Diagnosis not present

## 2021-01-30 DIAGNOSIS — I129 Hypertensive chronic kidney disease with stage 1 through stage 4 chronic kidney disease, or unspecified chronic kidney disease: Secondary | ICD-10-CM | POA: Diagnosis not present

## 2021-01-30 DIAGNOSIS — N1832 Chronic kidney disease, stage 3b: Secondary | ICD-10-CM | POA: Diagnosis not present

## 2021-02-14 DIAGNOSIS — Z23 Encounter for immunization: Secondary | ICD-10-CM | POA: Diagnosis not present

## 2021-02-14 DIAGNOSIS — Z20828 Contact with and (suspected) exposure to other viral communicable diseases: Secondary | ICD-10-CM | POA: Diagnosis not present

## 2021-03-05 ENCOUNTER — Other Ambulatory Visit (INDEPENDENT_AMBULATORY_CARE_PROVIDER_SITE_OTHER): Payer: Self-pay | Admitting: Nurse Practitioner

## 2021-03-13 ENCOUNTER — Ambulatory Visit (HOSPITAL_COMMUNITY): Payer: Medicare Other

## 2021-03-13 DIAGNOSIS — F4321 Adjustment disorder with depressed mood: Secondary | ICD-10-CM | POA: Diagnosis not present

## 2021-03-13 DIAGNOSIS — F5081 Binge eating disorder: Secondary | ICD-10-CM | POA: Diagnosis not present

## 2021-03-13 DIAGNOSIS — F4011 Social phobia, generalized: Secondary | ICD-10-CM | POA: Diagnosis not present

## 2021-03-13 DIAGNOSIS — F3175 Bipolar disorder, in partial remission, most recent episode depressed: Secondary | ICD-10-CM | POA: Diagnosis not present

## 2021-03-18 ENCOUNTER — Ambulatory Visit (HOSPITAL_COMMUNITY)
Admission: RE | Admit: 2021-03-18 | Discharge: 2021-03-18 | Disposition: A | Discharge status: Home / Self Care | Payer: Medicare Other | Source: Ambulatory Visit | Attending: Urology | Admitting: Urology

## 2021-03-18 ENCOUNTER — Other Ambulatory Visit: Payer: Self-pay

## 2021-03-18 DIAGNOSIS — N200 Calculus of kidney: Secondary | ICD-10-CM | POA: Insufficient documentation

## 2021-03-18 DIAGNOSIS — F4320 Adjustment disorder, unspecified: Secondary | ICD-10-CM | POA: Diagnosis not present

## 2021-03-18 DIAGNOSIS — N3021 Other chronic cystitis with hematuria: Secondary | ICD-10-CM | POA: Diagnosis not present

## 2021-03-18 DIAGNOSIS — I129 Hypertensive chronic kidney disease with stage 1 through stage 4 chronic kidney disease, or unspecified chronic kidney disease: Secondary | ICD-10-CM | POA: Diagnosis not present

## 2021-03-18 DIAGNOSIS — R82991 Hypocitraturia: Secondary | ICD-10-CM | POA: Diagnosis not present

## 2021-03-18 DIAGNOSIS — R809 Proteinuria, unspecified: Secondary | ICD-10-CM | POA: Diagnosis not present

## 2021-03-18 DIAGNOSIS — N1832 Chronic kidney disease, stage 3b: Secondary | ICD-10-CM | POA: Diagnosis not present

## 2021-03-18 DIAGNOSIS — N261 Atrophy of kidney (terminal): Secondary | ICD-10-CM | POA: Diagnosis not present

## 2021-03-20 ENCOUNTER — Encounter: Payer: Self-pay | Admitting: Urology

## 2021-03-20 ENCOUNTER — Other Ambulatory Visit: Payer: Self-pay

## 2021-03-20 ENCOUNTER — Ambulatory Visit (INDEPENDENT_AMBULATORY_CARE_PROVIDER_SITE_OTHER): Payer: Medicare Other | Admitting: Urology

## 2021-03-20 VITALS — BP 141/81 | HR 96 | Temp 97.9°F | Wt 211.0 lb

## 2021-03-20 DIAGNOSIS — N200 Calculus of kidney: Secondary | ICD-10-CM | POA: Diagnosis not present

## 2021-03-20 DIAGNOSIS — N3021 Other chronic cystitis with hematuria: Secondary | ICD-10-CM | POA: Diagnosis not present

## 2021-03-20 NOTE — Patient Instructions (Signed)
Dietary Guidelines to Help Prevent Kidney Stones Kidney stones are deposits of minerals and salts that form inside your kidneys. Your risk of developing kidney stones may be greater depending on your diet, your lifestyle, the medicines you take, and whether you have certain medical conditions. Most people can lower their chances of developing kidney stones by following the instructions below. Your dietitian may give you more specific instructions depending on your overall health and the type of kidney stones you tend to develop. What are tips for following this plan? Reading food labels  Choose foods with "no salt added" or "low-salt" labels. Limit your salt (sodium) intake to less than 1,500 mg a day. Choose foods with calcium for each meal and snack. Try to eat about 300 mg of calcium at each meal. Foods that contain 200-500 mg of calcium a serving include: 8 oz (237 mL) of milk, calcium-fortifiednon-dairy milk, and calcium-fortifiedfruit juice. Calcium-fortified means that calcium has been added to these drinks. 8 oz (237 mL) of kefir, yogurt, and soy yogurt. 4 oz (114 g) of tofu. 1 oz (28 g) of cheese. 1 cup (150 g) of dried figs. 1 cup (91 g) of cooked broccoli. One 3 oz (85 g) can of sardines or mackerel. Most people need 1,000-1,500 mg of calcium a day. Talk to your dietitian about how much calcium is recommended for you. Shopping Buy plenty of fresh fruits and vegetables. Most people do not need to avoid fruits and vegetables, even if these foods contain nutrients that may contribute to kidney stones. When shopping for convenience foods, choose: Whole pieces of fruit. Pre-made salads with dressing on the side. Low-fat fruit and yogurt smoothies. Avoid buying frozen meals or prepared deli foods. These can be high in sodium. Look for foods with live cultures, such as yogurt and kefir. Choose high-fiber grains, such as whole-wheat breads, oat bran, and wheat cereals. Cooking Do not add  salt to food when cooking. Place a salt shaker on the table and allow each person to add his or her own salt to taste. Use vegetable protein, such as beans, textured vegetable protein (TVP), or tofu, instead of meat in pasta, casseroles, and soups. Meal planning Eat less salt, if told by your dietitian. To do this: Avoid eating processed or pre-made food. Avoid eating fast food. Eat less animal protein, including cheese, meat, poultry, or fish, if told by your dietitian. To do this: Limit the number of times you have meat, poultry, fish, or cheese each week. Eat a diet free of meat at least 2 days a week. Eat only one serving each day of meat, poultry, fish, or seafood. When you prepare animal protein, cut pieces into small portion sizes. For most meat and fish, one serving is about the size of the palm of your hand. Eat at least five servings of fresh fruits and vegetables each day. To do this: Keep fruits and vegetables on hand for snacks. Eat one piece of fruit or a handful of berries with breakfast. Have a salad and fruit at lunch. Have two kinds of vegetables at dinner. Limit foods that are high in a substance called oxalate. These include: Spinach (cooked), rhubarb, beets, sweet potatoes, and Swiss chard. Peanuts. Potato chips, french fries, and baked potatoes with skin on. Nuts and nut products. Chocolate. If you regularly take a diuretic medicine, make sure to eat at least 1 or 2 servings of fruits or vegetables that are high in potassium each day. These include: Avocado. Banana. Orange, prune,   carrot, or tomato juice. Baked potato. Cabbage. Beans and split peas. Lifestyle  Drink enough fluid to keep your urine pale yellow. This is the most important thing you can do. Spread your fluid intake throughout the day. If you drink alcohol: Limit how much you use to: 0-1 drink a day for women who are not pregnant. 0-2 drinks a day for men. Be aware of how much alcohol is in your  drink. In the [place] one drink equals one 12 oz bottle of beer (355 mL), one 5 oz glass of wine (148 mL), or one 1 oz glass of hard liquor (44 mL). Lose weight if told by your health care provider. Work with your dietitian to find an eating plan and weight loss strategies that work best for you. General information Talk to your health care provider and dietitian about taking daily supplements. You may be told the following depending on your health and the cause of your kidney stones: Not to take supplements with vitamin C. To take a calcium supplement. To take a daily probiotic supplement. To take other supplements such as magnesium, fish oil, or vitamin B6. Take over-the-counter and prescription medicines only as told by your health care provider. These include supplements. What foods should I limit? Limit your intake of the following foods, or eat them as told by your dietitian. Vegetables Spinach. Rhubarb. Beets. Canned vegetables. Pickles. Olives. Baked potatoes with skin. Grains Wheat bran. Baked goods. Salted crackers. Cereals high in sugar. Meats and other proteins Nuts. Nut butters. Large portions of meat, poultry, or fish. Salted, precooked, or cured meats, such as sausages, meat loaves, and hot dogs. Dairy Cheese. Beverages Regular soft drinks. Regular vegetable juice. Seasonings and condiments Seasoning blends with salt. Salad dressings. Soy sauce. Ketchup. Barbecue sauce. Other foods Canned soups. Canned pasta sauce. Casseroles. Pizza. Lasagna. Frozen meals. Potato chips. French fries. The items listed above may not be a complete list of foods and beverages you should limit. Contact a dietitian for more information. What foods should I avoid? Talk to your dietitian about specific foods you should avoid based on the type of kidney stones you have and your overall health. Fruits Grapefruit. The item listed above may not be a complete list of foods and beverages you should  avoid. Contact a dietitian for more information. Summary Kidney stones are deposits of minerals and salts that form inside your kidneys. You can lower your risk of kidney stones by making changes to your diet. The most important thing you can do is drink enough fluid. Drink enough fluid to keep your urine pale yellow. Talk to your dietitian about how much calcium you should have each day, and eat less salt and animal protein as told by your dietitian. This information is not intended to replace advice given to you by your health care provider. Make sure you discuss any questions you have with your health care provider. Document Revised: 05/12/2019 Document Reviewed: 05/12/2019 Elsevier Patient Education  2022 [employer]  

## 2021-03-20 NOTE — Progress Notes (Signed)
Urological Symptom Review  Patient is experiencing the following symptoms: Get up at night to urinate Trouble starting stream Have to strain to urinate   Review of Systems  Gastrointestinal (upper)  : Negative for upper GI symptoms  Gastrointestinal (lower) : Negative for lower GI symptoms  Constitutional : Fatigue  Skin: Negative for skin symptoms  Eyes: Blurred vision  Ear/Nose/Throat : Sinus problems  Hematologic/Lymphatic: Negative for Hematologic/Lymphatic symptoms  Cardiovascular : Negative for cardiovascular symptoms  Respiratory : Shortness of breath  Endocrine: Negative for endocrine symptoms  Musculoskeletal: Back pain  Neurological: Negative for neurological symptoms  Psychologic: Depression Anxiety

## 2021-03-20 NOTE — Progress Notes (Signed)
03/20/2021 11:40 AM   [name] December 28, 1970 [id number]  Referring provider: Doree Albee, MD No address on file  Followup nephrolithiasis  HPI: [name] is a 50yo here for followup for nephrolithiasis and UTI. NO UTIs since last visit. She is on macrobid 50mg  qhs for prophylaxis. No stone events since last visit. Renal US 10/17 shows bilateral stable renal calculi. No other complaints today   PMH: Past Medical History:  Diagnosis Date   Anxiety    Chronic kidney disease    CKD (chronic kidney disease) stage 3, GFR 30-59 ml/min (HCC) 03/15/2019   Depression    Diarrhea 05/04/2017   Essential hypertension, benign 03/15/2019   Hypertension    Hypothyroidism    Hypothyroidism, adult 03/15/2019   Kidney stones    LGSIL on Pap smear of cervix 08/17/2020   Needs colpo per [facility] immediate risk is 4.3 for CIN 3+risk    Obesity (BMI 30-39.9) 03/15/2019   Renal disorder    Vitamin D deficiency disease 03/15/2019    Surgical History: Past Surgical History:  Procedure Laterality Date   BIOPSY  06/05/2017   Procedure: BIOPSY;  Surgeon: Rogene Houston, MD;  [facility]: [facility];  Service: Endoscopy;;  colon   CHOLECYSTECTOMY     COLONOSCOPY N/A 06/05/2017   Procedure: COLONOSCOPY;  Surgeon: Rogene Houston, MD;  Location: [facility];  Service: Endoscopy;  Laterality: N/A;  8:30   CYSTOSCOPY WITH RETROGRADE PYELOGRAM, URETEROSCOPY AND STENT PLACEMENT Left 10/04/2020   Procedure: CYSTOSCOPY WITH LEFT RETROGRADE PYELOGRAM, DIAGNOSTIC LEFT URETEROSCOPY AND LEFT URETEREAL STENT PLACEMENT;  Surgeon: Cleon Gustin, MD;  Location: [facility];  Service: Urology;  Laterality: Left;   CYSTOSCOPY WITH RETROGRADE PYELOGRAM, URETEROSCOPY AND STENT PLACEMENT Left 10/16/2020   Procedure: CYSTOSCOPY WITH LEFT RETROGRADE PYELOGRAM, LEFT URETEROSCOPY  WITH LASER AND LEFT URETERAL STENT EXCHANGE;  Surgeon: Cleon Gustin, MD;  Location: [facility];  Service: Urology;  Laterality: Left;    HOLMIUM LASER APPLICATION Left 09/01/270   Procedure: HOLMIUM LASER APPLICATION;  Surgeon: Cleon Gustin, MD;  Location: [facility];  Service: Urology;  Laterality: Left;   KIDNEY STONE SURGERY     kidney stones     LASER ABLATION CONDOLAMATA N/A 10/31/2020   Procedure: LASER ABLATION OF THE CERVIX;  Surgeon: Florian Buff, MD;  Location: [facility];  Service: Gynecology;  Laterality: N/A;    Home Medications:  Allergies as of 03/20/2021       Reactions   Morphine And Related Itching, Hives   Sulfa Antibiotics Swelling   Tape Itching, Other (See Comments)   Surgical         Medication List        Accurate as of March 20, 2021 11:40 AM. If you have any questions, ask your nurse or doctor.          albuterol 108 (90 Base) MCG/ACT inhaler Commonly known as: VENTOLIN HFA Inhale 1-2 puffs into the lungs every 6 (six) hours as needed for wheezing or shortness of breath.   ALPRAZolam 1 MG tablet Commonly known as: XANAX Take 1 mg by mouth 3 (three) times daily as needed for anxiety.   amantadine 100 MG capsule Commonly known as: SYMMETREL Take 100 mg by mouth at bedtime.   amLODipine 5 MG tablet Commonly known as: NORVASC Take 1 tablet (5 mg total) by mouth in the morning and at bedtime.   ARIPiprazole 5 MG tablet Commonly known as: ABILIFY Take 5 mg by mouth  at bedtime.   benzonatate 100 MG capsule Commonly known as: TESSALON Take 1 capsule (100 mg total) by mouth every 8 (eight) hours.   carvedilol 25 MG tablet Commonly known as: COREG Take 25 mg by mouth 2 (two) times daily.   cefUROXime 500 MG tablet Commonly known as: CEFTIN Take 1 tablet (500 mg total) by mouth 2 (two) times daily with a meal.   cloNIDine 0.3 MG tablet Commonly known as: CATAPRES Take 1 tablet (0.3 mg total) by mouth 2 (two) times daily.   cyclobenzaprine 5 MG tablet Commonly known as: FLEXERIL Take 1 tablet (5 mg total) by mouth 3 (three) times daily as needed for muscle spasms.    diphenhydrAMINE 25 MG tablet Commonly known as: BENADRYL Take 25 mg by mouth at bedtime. *take with pain medication to prevent itching*   doxepin 75 MG capsule Commonly known as: SINEQUAN Take 225 mg by mouth at bedtime.   HYDROmorphone 2 MG tablet Commonly known as: Dilaudid Take 1 tablet (2 mg total) by mouth every 4 (four) hours as needed for moderate pain or severe pain.   ketorolac 10 MG tablet Commonly known as: TORADOL Take 1 tablet (10 mg total) by mouth every 8 (eight) hours as needed.   lamoTRIgine 200 MG tablet Commonly known as: LAMICTAL Take 200 mg by mouth in the morning.   losartan 25 MG tablet Commonly known as: COZAAR Take 25 mg by mouth in the morning.   medroxyPROGESTERone 150 MG/ML injection Commonly known as: DEPO-PROVERA Inject 1 mL (150 mg total) into the muscle every 3 (three) months.   mirabegron ER 25 MG Tb24 tablet Commonly known as: MYRBETRIQ Take 1 tablet (25 mg total) by mouth daily.   nitrofurantoin 50 MG capsule Commonly known as: MACRODANTIN Take 1 capsule (50 mg total) by mouth at bedtime.   NP Thyroid 60 MG tablet Generic drug: thyroid TAKE 1 TABLET (60 MG TOTAL) BY MOUTH DAILY BEFORE BREAKFAST.   ondansetron 8 MG disintegrating tablet Commonly known as: Zofran ODT Take 1 tablet (8 mg total) by mouth every 8 (eight) hours as needed for nausea or vomiting.   oxyCODONE-acetaminophen 10-325 MG tablet Commonly known as: Percocet Take 1 tablet by mouth every 4 (four) hours as needed for pain. What changed: when to take this   oxyCODONE-acetaminophen 5-325 MG tablet Commonly known as: PERCOCET/ROXICET Take 1-2 tablets by mouth every 6 (six) hours as needed for severe pain. What changed: Another medication with the same name was changed. Make sure you understand how and when to take each.   potassium citrate 5 MEQ (540 MG) SR tablet Commonly known as: UROCIT-K Take 10 mEq by mouth 3 (three) times daily.   QUEtiapine 400 MG 24 hr  tablet Commonly known as: SEROQUEL XR Take 400 mg by mouth at bedtime.   Vitamin D3 125 MCG (5000 UT) Tabs Take 10,000 Units by mouth daily.        Allergies:  Allergies  Allergen Reactions   Morphine And Related Itching and Hives   Sulfa Antibiotics Swelling   Tape Itching and Other (See Comments)    Surgical     Family History: Family History  Problem Relation Age of Onset   Cancer Father    Heart failure Father    Cancer Paternal Grandmother    Heart failure Paternal Grandmother    Cancer Maternal Grandmother    Colon cancer Neg Hx     Social History:  reports that she is a non-smoker but has been  exposed to tobacco smoke. She has never used smokeless tobacco. She reports that she does not drink alcohol and does not use drugs.  ROS: All other review of systems were reviewed and are negative except what is noted above in HPI  Physical Exam: BP (!) 141/81   Pulse 96   Temp 97.9 F (36.6 C)   Wt 211 lb (95.7 kg)   BMI 38.59 kg/m   Constitutional:  Alert and oriented, No acute distress. HEENT: [place] AT, moist mucus membranes.  Trachea midline, no masses. Cardiovascular: No clubbing, cyanosis, or edema. Respiratory: Normal respiratory effort, no increased work of breathing. GI: Abdomen is soft, nontender, nondistended, no abdominal masses GU: No CVA tenderness.  Lymph: No cervical or inguinal lymphadenopathy. Skin: No rashes, bruises or suspicious lesions. Neurologic: Grossly intact, no focal deficits, moving all 4 extremities. Psychiatric: Normal mood and affect.  Laboratory Data: Lab Results  Component Value Date   WBC 7.2 10/30/2020   HGB 12.6 10/30/2020   HCT 39.8 10/30/2020   MCV 89.4 10/30/2020   PLT 410 (H) 10/30/2020    Lab Results  Component Value Date   CREATININE 1.53 (H) 10/30/2020    No results found for: PSA  No results found for: TESTOSTERONE  Lab Results  Component Value Date   HGBA1C 5.1 04/03/2020    Urinalysis    Component  Value Date/Time   COLORURINE YELLOW 10/30/2020 1355   APPEARANCEUR Cloudy (A) 12/14/2020 1233   LABSPEC 1.025 10/30/2020 1355   PHURINE 5.0 10/30/2020 1355   GLUCOSEU Negative 12/14/2020 1233   HGBUR NEGATIVE 10/30/2020 1355   BILIRUBINUR Negative 12/14/2020 1233   KETONESUR NEGATIVE 10/30/2020 1355   PROTEINUR 1+ (A) 12/14/2020 1233   PROTEINUR 30 (A) 10/30/2020 1355   UROBILINOGEN 0.2 01/17/2009 0124   NITRITE Positive (A) 12/14/2020 1233   NITRITE NEGATIVE 10/30/2020 1355   LEUKOCYTESUR 1+ (A) 12/14/2020 1233   LEUKOCYTESUR SMALL (A) 10/30/2020 1355    Lab Results  Component Value Date   LABMICR See below: 12/14/2020   WBCUA 11-30 (A) 12/14/2020   LABEPIT 0-10 12/14/2020   MUCUS Present 12/14/2020   BACTERIA Many (A) 12/14/2020    Pertinent Imaging: Renal US 10/17: Images reviewed and discussed with the patient  Results for orders placed during the hospital encounter of 08/28/20  Abdomen 1 view (KUB)  Narrative CLINICAL DATA:  Nephrolithiasis  EXAM: ABDOMEN - 1 VIEW  COMPARISON:  CT 08/21/2020  FINDINGS: The bowel gas pattern is normal. 8 mm stone again seen projecting over the left renal shadow. No right sided renal calculi, which is partially obscured by overlying bowel gas and stool. Moderate volume stool throughout the colon. No acute osseous findings. Other significant radiographic abnormality are seen.  IMPRESSION: 8 mm stone projecting over the left renal shadow, similar to prior exam.   Electronically Signed By: Davina Poke D.O. On: 08/29/2020 10:41  No results found for this or any previous visit.  No results found for this or any previous visit.  No results found for this or any previous visit.  Results for orders placed during the hospital encounter of 03/18/21  Ultrasound renal complete  Narrative CLINICAL DATA:  Nephrolithiasis  EXAM: RENAL / URINARY TRACT ULTRASOUND COMPLETE  COMPARISON:  12/07/2020  FINDINGS: Right  Kidney:  Renal measurements: 9.5 x 4.9 x 4.6 cm = volume: 112 mL. Renal cortical echogenicity has improved and is now normal. Cortical thickness is preserved. No hydronephrosis. No intrarenal masses are seen. A 6 mm nonobstructing calculus  is seen within the upper pole of the right kidney demonstrating twinkle artifact.  Left Kidney:  Renal measurements: 8.5 x 4.0 x 3.4 cm = volume: 60 mL. There is marked left renal cortical atrophy, progressive since prior examination with diffusely increased renal cortical echogenicity again noted. 13 mm nonobstructing calculus noted within the lower pole of the left kidney. No hydronephrosis. No intrarenal masses are seen.  Bladder:  Appears normal for degree of bladder distention. Bilateral ureteral jets again identified.  Other:  None.  IMPRESSION: Normal right renal cortical echogenicity, improved since prior examination.  Progressive, marked atrophy of the left kidney.  Bilateral nonobstructing nephrolithiasis   Electronically Signed By: Fidela Salisbury M.D. On: 03/19/2021 19:49  No results found for this or any previous visit.  No results found for this or any previous visit.  Results for orders placed in visit on 08/21/20  CT RENAL STONE STUDY  Narrative CLINICAL DATA:  Left flank pain for 3 months. Hematuria. Nephrolithiasis.  EXAM: CT ABDOMEN AND PELVIS WITHOUT CONTRAST  TECHNIQUE: Multidetector CT imaging of the abdomen and pelvis was performed following the standard protocol without IV contrast.  COMPARISON:  03/21/2019  FINDINGS: Lower chest: No acute findings.  Hepatobiliary: No mass visualized on this unenhanced exam. Prior cholecystectomy. No evidence of biliary obstruction.  Pancreas: No mass or inflammatory process visualized on this unenhanced exam.  Spleen:  Within normal limits in size.  Adrenals/Urinary tract: Moderate diffuse left renal parenchymal atrophy and scarring is again seen. A few  renal calculi are seen bilaterally, largest in the lower pole of the left kidney measuring 8 mm. No evidence of ureteral calculi or hydronephrosis. Unremarkable unopacified urinary bladder.  Stomach/Bowel: No evidence of obstruction, inflammatory process, or abnormal fluid collections. Normal appendix visualized.  Vascular/Lymphatic: No pathologically enlarged lymph nodes identified. No evidence of abdominal aortic aneurysm.  Reproductive:  No mass or other significant abnormality.  Other:  None.  Musculoskeletal:  No suspicious bone lesions identified.  IMPRESSION: Bilateral nephrolithiasis. No evidence of ureteral calculi, hydronephrosis, or other acute findings.  Chronic diffuse left renal parenchymal atrophy and scarring.   Electronically Signed By: Marlaine Hind M.D. On: 08/21/2020 16:45   Assessment & Plan:    1. Chronic cystitis with hematuria -Continue macrobid 50mg  QHS - Urinalysis, Routine w reflex microscopic  2. Nephrolithiasis -RTC 6 months with renal US   No follow-ups on file.  Nicolette Bang, MD  [facility] [place]

## 2021-03-21 LAB — MICROSCOPIC EXAMINATION
RBC, Urine: NONE SEEN /hpf (ref 0–2)
Renal Epithel, UA: NONE SEEN /hpf

## 2021-03-21 LAB — URINALYSIS, ROUTINE W REFLEX MICROSCOPIC
Bilirubin, UA: NEGATIVE
Glucose, UA: NEGATIVE
Nitrite, UA: POSITIVE — AB
Specific Gravity, UA: >1.03 — ABNORMAL HIGH (ref 1.005–1.030)
Urobilinogen, Ur: 0.2 mg/dL (ref 0.2–1.0)
pH, UA: 5.5 (ref 5.0–7.5)

## 2021-03-25 ENCOUNTER — Telehealth: Payer: Self-pay

## 2021-03-25 DIAGNOSIS — N3021 Other chronic cystitis with hematuria: Secondary | ICD-10-CM

## 2021-03-25 NOTE — Telephone Encounter (Signed)
Patient recent UA from office visit 03/20/2021 not sent for culture. Patient will come by office tomorrow to leave urine for culture sample. Patient voiced understanding.

## 2021-03-26 ENCOUNTER — Other Ambulatory Visit: Payer: Medicare Other

## 2021-03-28 ENCOUNTER — Other Ambulatory Visit: Payer: Self-pay

## 2021-03-28 ENCOUNTER — Other Ambulatory Visit: Payer: Medicare Other

## 2021-03-28 DIAGNOSIS — R82998 Other abnormal findings in urine: Secondary | ICD-10-CM | POA: Diagnosis not present

## 2021-03-28 DIAGNOSIS — R350 Frequency of micturition: Secondary | ICD-10-CM

## 2021-03-28 DIAGNOSIS — N3021 Other chronic cystitis with hematuria: Secondary | ICD-10-CM

## 2021-03-28 LAB — URINALYSIS, ROUTINE W REFLEX MICROSCOPIC
Bilirubin, UA: NEGATIVE
Glucose, UA: NEGATIVE
Ketones, UA: NEGATIVE
Nitrite, UA: POSITIVE — AB
Specific Gravity, UA: 1.025 (ref 1.005–1.030)
Urobilinogen, Ur: 0.2 mg/dL (ref 0.2–1.0)
pH, UA: 6 (ref 5.0–7.5)

## 2021-03-28 LAB — MICROSCOPIC EXAMINATION: Renal Epithel, UA: NONE SEEN /hpf

## 2021-03-31 LAB — URINE CULTURE

## 2021-04-08 ENCOUNTER — Other Ambulatory Visit: Payer: Self-pay

## 2021-04-08 ENCOUNTER — Telehealth: Payer: Self-pay

## 2021-04-08 DIAGNOSIS — N390 Urinary tract infection, site not specified: Secondary | ICD-10-CM

## 2021-04-08 DIAGNOSIS — N3021 Other chronic cystitis with hematuria: Secondary | ICD-10-CM

## 2021-04-08 MED ORDER — TRIMETHOPRIM 100 MG PO TABS: 100.0000 mg | ORAL_TABLET | Freq: Every evening | ORAL | 3 refills | Status: DC

## 2021-04-08 MED ORDER — DOXYCYCLINE HYCLATE 100 MG PO CAPS: 100.0000 mg | ORAL_CAPSULE | Freq: Two times a day (BID) | ORAL | 0 refills | Status: DC

## 2021-04-08 NOTE — Telephone Encounter (Signed)
-----   Message from Cleon Gustin, MD sent at 04/06/2021  6:18 AM EDT ----- Bactrim DS BID for 7 days ----- Message ----- From: Dorisann Frames, RN Sent: 04/01/2021   8:23 AM EDT To: Cleon Gustin, MD  Culture drop off. No treatment

## 2021-04-11 DIAGNOSIS — Z20828 Contact with and (suspected) exposure to other viral communicable diseases: Secondary | ICD-10-CM | POA: Diagnosis not present

## 2021-04-15 MED ORDER — AMOXICILLIN-POT CLAVULANATE 500-125 MG PO TABS: 1.0000 | ORAL_TABLET | Freq: Every evening | ORAL | 11 refills | Status: DC

## 2021-04-15 NOTE — Telephone Encounter (Signed)
New order for augmentin 500/125mg  sent to pharmacy for nightly dosage.  Trimethoprim cancelled.

## 2021-05-03 DIAGNOSIS — Z79899 Other long term (current) drug therapy: Secondary | ICD-10-CM | POA: Diagnosis not present

## 2021-05-03 DIAGNOSIS — I129 Hypertensive chronic kidney disease with stage 1 through stage 4 chronic kidney disease, or unspecified chronic kidney disease: Secondary | ICD-10-CM | POA: Diagnosis not present

## 2021-05-03 DIAGNOSIS — N200 Calculus of kidney: Secondary | ICD-10-CM | POA: Diagnosis not present

## 2021-05-03 DIAGNOSIS — R809 Proteinuria, unspecified: Secondary | ICD-10-CM | POA: Diagnosis not present

## 2021-05-03 DIAGNOSIS — N1832 Chronic kidney disease, stage 3b: Secondary | ICD-10-CM | POA: Diagnosis not present

## 2021-05-07 ENCOUNTER — Other Ambulatory Visit: Payer: Self-pay

## 2021-05-07 ENCOUNTER — Telehealth: Payer: Self-pay | Admitting: Obstetrics & Gynecology

## 2021-05-07 NOTE — Telephone Encounter (Signed)
Patient was getting her depo refill from another provider who has since retired and her primary was [place]. She was wondering if you could call in 1 refill to [place] til she sees you on the 19th.

## 2021-05-08 ENCOUNTER — Telehealth: Payer: Self-pay | Admitting: Obstetrics & Gynecology

## 2021-05-08 MED ORDER — MEDROXYPROGESTERONE ACETATE 150 MG/ML IM SUSP: 150.0000 mg | INTRAMUSCULAR | 3 refills | Status: DC

## 2021-05-08 NOTE — Telephone Encounter (Signed)
Returned pt's call to inform her that her requested refill was sent in. Pt confirmed understanding.

## 2021-05-08 NOTE — Telephone Encounter (Signed)
Patient calling stating that she needs a refill on her depo. States that her doctor before has retired and just need on until her visit 12/19 with [facility]. Asking to be sent to [facility]

## 2021-05-10 DIAGNOSIS — N1831 Chronic kidney disease, stage 3a: Secondary | ICD-10-CM | POA: Diagnosis not present

## 2021-05-10 DIAGNOSIS — R82991 Hypocitraturia: Secondary | ICD-10-CM | POA: Diagnosis not present

## 2021-05-10 DIAGNOSIS — R809 Proteinuria, unspecified: Secondary | ICD-10-CM | POA: Diagnosis not present

## 2021-05-10 DIAGNOSIS — I129 Hypertensive chronic kidney disease with stage 1 through stage 4 chronic kidney disease, or unspecified chronic kidney disease: Secondary | ICD-10-CM | POA: Diagnosis not present

## 2021-05-10 DIAGNOSIS — N200 Calculus of kidney: Secondary | ICD-10-CM | POA: Diagnosis not present

## 2021-05-10 DIAGNOSIS — Z6838 Body mass index (BMI) 38.0-38.9, adult: Secondary | ICD-10-CM | POA: Diagnosis not present

## 2021-05-14 DIAGNOSIS — Z20828 Contact with and (suspected) exposure to other viral communicable diseases: Secondary | ICD-10-CM | POA: Diagnosis not present

## 2021-05-20 ENCOUNTER — Ambulatory Visit (INDEPENDENT_AMBULATORY_CARE_PROVIDER_SITE_OTHER): Payer: Medicare Other | Admitting: Obstetrics & Gynecology

## 2021-05-20 ENCOUNTER — Encounter: Payer: Self-pay | Admitting: Obstetrics & Gynecology

## 2021-05-20 ENCOUNTER — Other Ambulatory Visit: Payer: Self-pay

## 2021-05-20 ENCOUNTER — Other Ambulatory Visit (HOSPITAL_COMMUNITY)
Admission: RE | Admit: 2021-05-20 | Discharge: 2021-05-20 | Disposition: A | Discharge status: Home / Self Care | Payer: Medicare Other | Source: Ambulatory Visit | Attending: Obstetrics & Gynecology | Admitting: Obstetrics & Gynecology

## 2021-05-20 VITALS — BP 188/108 | HR 100 | Ht 62.0 in | Wt 213.0 lb

## 2021-05-20 DIAGNOSIS — Z9889 Other specified postprocedural states: Secondary | ICD-10-CM

## 2021-05-20 DIAGNOSIS — Z124 Encounter for screening for malignant neoplasm of cervix: Secondary | ICD-10-CM

## 2021-05-20 MED ORDER — MEDROXYPROGESTERONE ACETATE 150 MG/ML IM SUSP: 150.0000 mg | INTRAMUSCULAR | 3 refills | Status: DC

## 2021-05-20 NOTE — Progress Notes (Signed)
Chief Complaint  Patient presents with   Follow-up    6 month pap      50 y.o. G1P0010 No LMP recorded. Patient has had an injection. The current method of family planning is Depo-Provera injections.  Outpatient Encounter Medications as of 05/20/2021  Medication Sig Note   ARIPiprazole (ABILIFY) 5 MG tablet Take 5 mg by mouth at bedtime.    carvedilol (COREG) 25 MG tablet Take 25 mg by mouth 2 (two) times daily.    cloNIDine (CATAPRES) 0.3 MG tablet Take 1 tablet (0.3 mg total) by mouth 2 (two) times daily.    cyclobenzaprine (FLEXERIL) 5 MG tablet Take 1 tablet (5 mg total) by mouth 3 (three) times daily as needed for muscle spasms.    doxepin (SINEQUAN) 75 MG capsule Take 225 mg by mouth at bedtime.     lamoTRIgine (LAMICTAL) 200 MG tablet Take 200 mg by mouth in the morning.    losartan (COZAAR) 25 MG tablet Take 25 mg by mouth in the morning.    medroxyPROGESTERone (DEPO-PROVERA) 150 MG/ML injection Inject 1 mL (150 mg total) into the muscle every 3 (three) months.    mirabegron ER (MYRBETRIQ) 25 MG TB24 tablet Take 1 tablet (25 mg total) by mouth daily.    NP THYROID 60 MG tablet TAKE 1 TABLET (60 MG TOTAL) BY MOUTH DAILY BEFORE BREAKFAST.    potassium citrate (UROCIT-K) 5 MEQ (540 MG) SR tablet Take 10 mEq by mouth 3 (three) times daily.    QUEtiapine (SEROQUEL XR) 400 MG 24 hr tablet Take 400 mg by mouth at bedtime.     albuterol (VENTOLIN HFA) 108 (90 Base) MCG/ACT inhaler Inhale 1-2 puffs into the lungs every 6 (six) hours as needed for wheezing or shortness of breath. (Patient not taking: Reported on 12/14/2020)    ALPRAZolam (XANAX) 1 MG tablet Take 1 mg by mouth 3 (three) times daily as needed for anxiety.     amantadine (SYMMETREL) 100 MG capsule Take 100 mg by mouth at bedtime.    amLODipine (NORVASC) 5 MG tablet Take 1 tablet (5 mg total) by mouth in the morning and at bedtime.    Cholecalciferol (VITAMIN D3) 125 MCG (5000 UT) TABS Take 10,000 Units by mouth daily.  (Patient not taking: Reported on 05/20/2021) 10/10/2020: On hold (patient is out of medication)   diphenhydrAMINE (BENADRYL) 25 MG tablet Take 25 mg by mouth at bedtime. *take with pain medication to prevent itching* (Patient not taking: Reported on 12/14/2020)    doxycycline (VIBRAMYCIN) 100 MG capsule Take 1 capsule (100 mg total) by mouth every 12 (twelve) hours. (Patient not taking: Reported on 05/20/2021)    HYDROmorphone (DILAUDID) 2 MG tablet Take 1 tablet (2 mg total) by mouth every 4 (four) hours as needed for moderate pain or severe pain. (Patient not taking: Reported on 05/20/2021)    ketorolac (TORADOL) 10 MG tablet Take 1 tablet (10 mg total) by mouth every 8 (eight) hours as needed. (Patient not taking: Reported on 12/14/2020)    ondansetron (ZOFRAN ODT) 8 MG disintegrating tablet Take 1 tablet (8 mg total) by mouth every 8 (eight) hours as needed for nausea or vomiting. (Patient not taking: Reported on 05/20/2021)    oxyCODONE-acetaminophen (PERCOCET) 10-325 MG tablet Take 1 tablet by mouth every 4 (four) hours as needed for pain. (Patient not taking: Reported on 05/20/2021)    oxyCODONE-acetaminophen (PERCOCET/ROXICET) 5-325 MG tablet Take 1-2 tablets by mouth every 6 (six) hours as needed for  severe pain. (Patient not taking: Reported on 12/14/2020)    [DISCONTINUED] amoxicillin-clavulanate (AUGMENTIN) 500-125 MG tablet Take 1 tablet (500 mg total) by mouth at bedtime.    [DISCONTINUED] benzonatate (TESSALON) 100 MG capsule Take 1 capsule (100 mg total) by mouth every 8 (eight) hours. (Patient not taking: Reported on 12/14/2020)    No facility-administered encounter medications on file as of 05/20/2021.    Subjective Pt here for her 1st follow up Pap surveillance after the cytology S/P laser ablation Past Medical History:  Diagnosis Date   Anxiety    Chronic kidney disease    CKD (chronic kidney disease) stage 3, GFR 30-59 ml/min ([place]) 03/15/2019   Depression    Diarrhea 05/04/2017    Essential hypertension, benign 03/15/2019   Hypertension    Hypothyroidism    Hypothyroidism, adult 03/15/2019   Kidney stones    LGSIL on Pap smear of cervix 08/17/2020   Needs colpo per ASCCP immediate risk is 4.3 for CIN 3+risk    Obesity (BMI 30-39.9) 03/15/2019   Renal disorder    Vitamin D deficiency disease 03/15/2019    Past Surgical History:  Procedure Laterality Date   BIOPSY  06/05/2017   Procedure: BIOPSY;  Surgeon: Rogene Houston, MD;  [facility];  Service: Endoscopy;;  colon   CHOLECYSTECTOMY     COLONOSCOPY N/A 06/05/2017   Procedure: COLONOSCOPY;  Surgeon: Rogene Houston, MD;  [facility];  Service: Endoscopy;  Laterality: N/A;  8:30   CYSTOSCOPY WITH RETROGRADE PYELOGRAM, URETEROSCOPY AND STENT PLACEMENT Left 10/04/2020   Procedure: CYSTOSCOPY WITH LEFT RETROGRADE PYELOGRAM, DIAGNOSTIC LEFT URETEROSCOPY AND LEFT URETEREAL STENT PLACEMENT;  Surgeon: Cleon Gustin, MD;  Location: [facility];  Service: Urology;  Laterality: Left;   CYSTOSCOPY WITH RETROGRADE PYELOGRAM, URETEROSCOPY AND STENT PLACEMENT Left 10/16/2020   Procedure: CYSTOSCOPY WITH LEFT RETROGRADE PYELOGRAM, LEFT URETEROSCOPY  WITH LASER AND LEFT URETERAL STENT EXCHANGE;  Surgeon: Cleon Gustin, MD;  Location: [facility];  Service: Urology;  Laterality: Left;   HOLMIUM LASER APPLICATION Left 01/09/1750   Procedure: HOLMIUM LASER APPLICATION;  Surgeon: Cleon Gustin, MD;  Location: [facility];  Service: Urology;  Laterality: Left;   KIDNEY STONE SURGERY     kidney stones     LASER ABLATION CONDOLAMATA N/A 10/31/2020   Procedure: LASER ABLATION OF THE CERVIX;  Surgeon: Florian Buff, MD;  Location: [facility];  Service: Gynecology;  Laterality: N/A;    OB History     Gravida  1   Para      Term      Preterm      AB  1   Living  0      SAB      IAB      Ectopic      Multiple      Live Births              Allergies  Allergen Reactions   Morphine And  Related Itching and Hives   Sulfa Antibiotics Swelling   Tape Itching and Other (See Comments)    Surgical     Social History   Socioeconomic History   Marital status: Divorced    Spouse name: Not on file   Number of children: Not on file   Years of education: Not on file   Highest education level: Not on file  Occupational History   Not on file  Tobacco Use   Smoking status: Never    Passive  exposure: Yes   Smokeless tobacco: Never  Vaping Use   Vaping Use: Never used  Substance and Sexual Activity   Alcohol use: No   Drug use: No   Sexual activity: Not Currently    Birth control/protection: Injection  Other Topics Concern   Not on file  Social History Narrative   Divorced twice,1st lasted 5 years,2nd 4 years.On disabilty secondary to mental illness since 2004.Previously used to [employer]Lives alone,has 2 cats.   Social Determinants of Health   Financial Resource Strain: Medium Risk   Difficulty of Paying Living Expenses: Somewhat hard  Food Insecurity: No Food Insecurity   Worried About Charity fundraiser in the Last Year: Never true   Ran Out of Food in the Last Year: Never true  Transportation Needs: No Transportation Needs   Lack of Transportation (Medical): No   Lack of Transportation (Non-Medical): No  Physical Activity: Insufficiently Active   Days of Exercise per Week: 2 days   Minutes of Exercise per Session: 10 min  Stress: Stress Concern Present   Feeling of Stress : To some extent  Social Connections: Socially Isolated   Frequency of Communication with Friends and Family: More than three times a week   Frequency of Social Gatherings with Friends and Family: Three times a week   Attends Religious Services: Never   Active Member of Clubs or Organizations: No   Attends Music therapist: Never   Marital Status: Divorced    Family History  Problem Relation Age of Onset   Cancer Paternal Grandmother    Heart failure  Paternal Grandmother    Cancer Maternal Grandmother    Cancer Father    Heart failure Father    Lung cancer Mother    Colon cancer Neg Hx     Medications:       Current Outpatient Medications:    ARIPiprazole (ABILIFY) 5 MG tablet, Take 5 mg by mouth at bedtime., Disp: , Rfl:    carvedilol (COREG) 25 MG tablet, Take 25 mg by mouth 2 (two) times daily., Disp: , Rfl:    cloNIDine (CATAPRES) 0.3 MG tablet, Take 1 tablet (0.3 mg total) by mouth 2 (two) times daily., Disp: 60 tablet, Rfl: 2   cyclobenzaprine (FLEXERIL) 5 MG tablet, Take 1 tablet (5 mg total) by mouth 3 (three) times daily as needed for muscle spasms., Disp: 30 tablet, Rfl: 0   doxepin (SINEQUAN) 75 MG capsule, Take 225 mg by mouth at bedtime. , Disp: , Rfl:    lamoTRIgine (LAMICTAL) 200 MG tablet, Take 200 mg by mouth in the morning., Disp: , Rfl:    losartan (COZAAR) 25 MG tablet, Take 25 mg by mouth in the morning., Disp: , Rfl:    medroxyPROGESTERone (DEPO-PROVERA) 150 MG/ML injection, Inject 1 mL (150 mg total) into the muscle every 3 (three) months., Disp: 1 mL, Rfl: 3   mirabegron ER (MYRBETRIQ) 25 MG TB24 tablet, Take 1 tablet (25 mg total) by mouth daily., Disp: 30 tablet, Rfl: 0   NP THYROID 60 MG tablet, TAKE 1 TABLET (60 MG TOTAL) BY MOUTH DAILY BEFORE BREAKFAST., Disp: 30 tablet, Rfl: 3   potassium citrate (UROCIT-K) 5 MEQ (540 MG) SR tablet, Take 10 mEq by mouth 3 (three) times daily., Disp: , Rfl:    QUEtiapine (SEROQUEL XR) 400 MG 24 hr tablet, Take 400 mg by mouth at bedtime. , Disp: , Rfl:    albuterol (VENTOLIN HFA) 108 (90 Base) MCG/ACT inhaler, Inhale 1-2 puffs  into the lungs every 6 (six) hours as needed for wheezing or shortness of breath. (Patient not taking: Reported on 12/14/2020), Disp: 1 each, Rfl: 0   ALPRAZolam (XANAX) 1 MG tablet, Take 1 mg by mouth 3 (three) times daily as needed for anxiety. , Disp: , Rfl:    amantadine (SYMMETREL) 100 MG capsule, Take 100 mg by mouth at bedtime., Disp: , Rfl:     amLODipine (NORVASC) 5 MG tablet, Take 1 tablet (5 mg total) by mouth in the morning and at bedtime., Disp: 60 tablet, Rfl: 2   Cholecalciferol (VITAMIN D3) 125 MCG (5000 UT) TABS, Take 10,000 Units by mouth daily. (Patient not taking: Reported on 05/20/2021), Disp: , Rfl:    diphenhydrAMINE (BENADRYL) 25 MG tablet, Take 25 mg by mouth at bedtime. *take with pain medication to prevent itching* (Patient not taking: Reported on 12/14/2020), Disp: , Rfl:    doxycycline (VIBRAMYCIN) 100 MG capsule, Take 1 capsule (100 mg total) by mouth every 12 (twelve) hours. (Patient not taking: Reported on 05/20/2021), Disp: 14 capsule, Rfl: 0   HYDROmorphone (DILAUDID) 2 MG tablet, Take 1 tablet (2 mg total) by mouth every 4 (four) hours as needed for moderate pain or severe pain. (Patient not taking: Reported on 05/20/2021), Disp: 30 tablet, Rfl: 0   ketorolac (TORADOL) 10 MG tablet, Take 1 tablet (10 mg total) by mouth every 8 (eight) hours as needed. (Patient not taking: Reported on 12/14/2020), Disp: 15 tablet, Rfl: 0   ondansetron (ZOFRAN ODT) 8 MG disintegrating tablet, Take 1 tablet (8 mg total) by mouth every 8 (eight) hours as needed for nausea or vomiting. (Patient not taking: Reported on 05/20/2021), Disp: 8 tablet, Rfl: 0   oxyCODONE-acetaminophen (PERCOCET) 10-325 MG tablet, Take 1 tablet by mouth every 4 (four) hours as needed for pain. (Patient not taking: Reported on 05/20/2021), Disp: 30 tablet, Rfl: 0   oxyCODONE-acetaminophen (PERCOCET/ROXICET) 5-325 MG tablet, Take 1-2 tablets by mouth every 6 (six) hours as needed for severe pain. (Patient not taking: Reported on 12/14/2020), Disp: 20 tablet, Rfl: 0  Objective Blood pressure (!) 188/108, pulse 100, height 5\' 2"  (1.575 m), weight 213 lb (96.6 kg).  General WDWN female NAD Vulva:  normal appearing vulva with no masses, tenderness or lesions Vagina:  normal mucosa, no discharge Cervix:  Normal no lesions     Pertinent ROS No burning with  urination, frequency or urgency No nausea, vomiting or diarrhea Nor fever chills or other constitutional symptoms   Labs or studies     Impression Diagnoses this Encounter::   ICD-10-CM   1. Post-operative state  Z98.890     2. Routine cervical smear  Z12.4 Cytology - PAP( [place])      Established relevant diagnosis(es):   Plan/Recommendations: No orders of the defined types were placed in this encounter.   Labs or Scans Ordered: No orders of the defined types were placed in this encounter.   Management:: Every 6 months for 2 years  Follow up No follow-ups on file.      All questions were answered.

## 2021-05-20 NOTE — Addendum Note (Signed)
Addended by: Janece Canterbury on: 05/20/2021 02:24 PM   Modules accepted: Orders

## 2021-05-22 DIAGNOSIS — N189 Chronic kidney disease, unspecified: Secondary | ICD-10-CM | POA: Diagnosis not present

## 2021-05-22 DIAGNOSIS — Z20822 Contact with and (suspected) exposure to covid-19: Secondary | ICD-10-CM | POA: Diagnosis not present

## 2021-05-22 DIAGNOSIS — I10 Essential (primary) hypertension: Secondary | ICD-10-CM | POA: Diagnosis not present

## 2021-05-22 DIAGNOSIS — N289 Disorder of kidney and ureter, unspecified: Secondary | ICD-10-CM | POA: Diagnosis not present

## 2021-05-22 DIAGNOSIS — Z885 Allergy status to narcotic agent status: Secondary | ICD-10-CM | POA: Diagnosis not present

## 2021-05-22 DIAGNOSIS — Z882 Allergy status to sulfonamides status: Secondary | ICD-10-CM | POA: Diagnosis not present

## 2021-05-23 LAB — CYTOLOGY - PAP: Diagnosis: NEGATIVE

## 2021-06-07 DIAGNOSIS — F339 Major depressive disorder, recurrent, unspecified: Secondary | ICD-10-CM | POA: Diagnosis not present

## 2021-06-07 DIAGNOSIS — Z299 Encounter for prophylactic measures, unspecified: Secondary | ICD-10-CM | POA: Diagnosis not present

## 2021-06-07 DIAGNOSIS — I10 Essential (primary) hypertension: Secondary | ICD-10-CM | POA: Diagnosis not present

## 2021-06-07 DIAGNOSIS — Z6841 Body Mass Index (BMI) 40.0 and over, adult: Secondary | ICD-10-CM | POA: Diagnosis not present

## 2021-06-07 DIAGNOSIS — Z789 Other specified health status: Secondary | ICD-10-CM | POA: Diagnosis not present

## 2021-06-07 DIAGNOSIS — N1831 Chronic kidney disease, stage 3a: Secondary | ICD-10-CM | POA: Diagnosis not present

## 2021-06-07 DIAGNOSIS — E559 Vitamin D deficiency, unspecified: Secondary | ICD-10-CM | POA: Diagnosis not present

## 2021-07-25 DIAGNOSIS — Z299 Encounter for prophylactic measures, unspecified: Secondary | ICD-10-CM | POA: Diagnosis not present

## 2021-07-25 DIAGNOSIS — N1831 Chronic kidney disease, stage 3a: Secondary | ICD-10-CM | POA: Diagnosis not present

## 2021-07-25 DIAGNOSIS — I10 Essential (primary) hypertension: Secondary | ICD-10-CM | POA: Diagnosis not present

## 2021-07-25 DIAGNOSIS — E559 Vitamin D deficiency, unspecified: Secondary | ICD-10-CM | POA: Diagnosis not present

## 2021-07-25 DIAGNOSIS — Z6841 Body Mass Index (BMI) 40.0 and over, adult: Secondary | ICD-10-CM | POA: Diagnosis not present

## 2021-08-08 DIAGNOSIS — Z299 Encounter for prophylactic measures, unspecified: Secondary | ICD-10-CM | POA: Diagnosis not present

## 2021-08-08 DIAGNOSIS — N1831 Chronic kidney disease, stage 3a: Secondary | ICD-10-CM | POA: Diagnosis not present

## 2021-08-08 DIAGNOSIS — I10 Essential (primary) hypertension: Secondary | ICD-10-CM | POA: Diagnosis not present

## 2021-08-14 DIAGNOSIS — R82991 Hypocitraturia: Secondary | ICD-10-CM | POA: Diagnosis not present

## 2021-08-14 DIAGNOSIS — N200 Calculus of kidney: Secondary | ICD-10-CM | POA: Diagnosis not present

## 2021-08-14 DIAGNOSIS — N1831 Chronic kidney disease, stage 3a: Secondary | ICD-10-CM | POA: Diagnosis not present

## 2021-08-14 DIAGNOSIS — N189 Chronic kidney disease, unspecified: Secondary | ICD-10-CM | POA: Diagnosis not present

## 2021-08-14 DIAGNOSIS — Z6838 Body mass index (BMI) 38.0-38.9, adult: Secondary | ICD-10-CM | POA: Diagnosis not present

## 2021-08-14 DIAGNOSIS — R809 Proteinuria, unspecified: Secondary | ICD-10-CM | POA: Diagnosis not present

## 2021-08-14 DIAGNOSIS — I129 Hypertensive chronic kidney disease with stage 1 through stage 4 chronic kidney disease, or unspecified chronic kidney disease: Secondary | ICD-10-CM | POA: Diagnosis not present

## 2021-08-17 DIAGNOSIS — H52209 Unspecified astigmatism, unspecified eye: Secondary | ICD-10-CM | POA: Diagnosis not present

## 2021-08-17 DIAGNOSIS — H5203 Hypermetropia, bilateral: Secondary | ICD-10-CM | POA: Diagnosis not present

## 2021-08-17 DIAGNOSIS — H524 Presbyopia: Secondary | ICD-10-CM | POA: Diagnosis not present

## 2021-08-22 ENCOUNTER — Ambulatory Visit: Payer: Medicare Other | Admitting: Physician Assistant

## 2021-08-22 ENCOUNTER — Other Ambulatory Visit: Payer: Self-pay

## 2021-08-22 VITALS — BP 138/81 | HR 96 | Ht 63.0 in | Wt 215.0 lb

## 2021-08-22 DIAGNOSIS — N390 Urinary tract infection, site not specified: Secondary | ICD-10-CM

## 2021-08-22 DIAGNOSIS — N200 Calculus of kidney: Secondary | ICD-10-CM

## 2021-08-22 DIAGNOSIS — N3091 Cystitis, unspecified with hematuria: Secondary | ICD-10-CM

## 2021-08-22 DIAGNOSIS — R310 Gross hematuria: Secondary | ICD-10-CM

## 2021-08-22 DIAGNOSIS — N3080 Other cystitis without hematuria: Secondary | ICD-10-CM | POA: Diagnosis not present

## 2021-08-22 LAB — URINALYSIS, ROUTINE W REFLEX MICROSCOPIC
Bilirubin, UA: NEGATIVE
Glucose, UA: NEGATIVE
Ketones, UA: NEGATIVE
Nitrite, UA: POSITIVE — AB
Protein,UA: NEGATIVE
RBC, UA: NEGATIVE
Specific Gravity, UA: 1.02 (ref 1.005–1.030)
Urobilinogen, Ur: 0.2 mg/dL (ref 0.2–1.0)
pH, UA: 7 (ref 5.0–7.5)

## 2021-08-22 LAB — MICROSCOPIC EXAMINATION
Epithelial Cells (non renal): >10 /hpf — AB (ref 0–10)
RBC, Urine: NONE SEEN /hpf (ref 0–2)
Renal Epithel, UA: NONE SEEN /hpf

## 2021-08-22 MED ORDER — AMOXICILLIN-POT CLAVULANATE 875-125 MG PO TABS: 1.0000 | ORAL_TABLET | Freq: Two times a day (BID) | ORAL | 0 refills | Status: DC

## 2021-08-22 NOTE — Progress Notes (Signed)
? ?Assessment: ?1. Urinary tract infection without hematuria, site unspecified   ?2. Gross hematuria   ?3. Nephrolithiasis   ? ? ?Plan: ?In light of the patient's complicated urologic history, will order MDX culture on today's urine.  Based on previous cultures Augmentin prescribed.  We will change treatment pending culture results.  CT Hematuria study ordered and pt will FU for her 67-monthstone eval with Dr. MAlyson Inglesand will add cysto to her eval.  ? ?Chief Complaint: ?No chief complaint on file. ? ? ?HPI: ?[name] is a 51y.o. female who presents for evaluation of Chronic cystitis with hematuria.  She reports that she has not taken bedtime Macrodantin in approximately 6 months.  She has follow-up scheduled after renal ultrasound for evaluation of ongoing nephrolithiasis.  Since her last visit in October, she has had occasional symptoms of UTI, but did not have evaluation for urinalysis.  Over the past week she reports gross hematuria. She continues to have incontinence with urge and stress symptoms but has not taken Myrbetriq as previously prescribed. ? ?UA = WBCs 11-30, greater than 10 epithelial cells, many bacteria, nitrite positive ? ?Renal function obtained on 08/14/2021 = BUN 29, creatinine 1.80, GFR 34 ? ?03/20/21 ?Sabrina CMcneelyis a 89yohere for followup for nephrolithiasis and UTI. NO UTIs since last visit. She is on macrobid '50mg'$  qhs for prophylaxis. No stone events since last visit. Renal [place] shows bilateral stable renal calculi. No other complaints today ? ? ?Portions of the above documentation were copied from a prior visit for review purposes only. ? ?Allergies: ?Allergies  ?Allergen Reactions  ? Morphine And Related Itching and Hives  ? Sulfa Antibiotics Swelling  ? Tape Itching and Other (See Comments)  ?  Surgical   ? ? ?PMH: ?Past Medical History:  ?Diagnosis Date  ? Anxiety   ? Chronic kidney disease   ? CKD (chronic kidney disease) stage 3, [place] 30-59 [place] 03/15/2019  ?  Depression   ? Diarrhea 05/04/2017  ? Essential hypertension, benign 03/15/2019  ? Hypertension   ? Hypothyroidism   ? Hypothyroidism, adult 03/15/2019  ? Kidney stones   ? LGSIL on Pap smear of cervix 08/17/2020  ? Needs colpo per ASCCP immediate risk is 4.3 for CIN 3+risk   ? Obesity (BMI 30-39.9) 03/15/2019  ? Renal disorder   ? Vitamin D deficiency disease 03/15/2019  ? ? ?PSH: ?Past Surgical History:  ?Procedure Laterality Date  ? BIOPSY  06/05/2017  ? Procedure: BIOPSY;  Surgeon: RRogene Houston MD;  [facility];  Service: Endoscopy;;  colon  ? CHOLECYSTECTOMY    ? COLONOSCOPY N/A 06/05/2017  ? Procedure: COLONOSCOPY;  Surgeon: RRogene Houston MD;  [facility];  Service: Endoscopy;  Laterality: N/A;  8:30  ? CYSTOSCOPY WITH RETROGRADE PYELOGRAM, URETEROSCOPY AND STENT PLACEMENT Left 10/04/2020  ? Procedure: CYSTOSCOPY WITH LEFT RETROGRADE PYELOGRAM, DIAGNOSTIC LEFT URETEROSCOPY AND LEFT URETEREAL STENT PLACEMENT;  Surgeon: MCleon Gustin MD;  Location: [facility];  Service: Urology;  Laterality: Left;  ? CYSTOSCOPY WITH RETROGRADE PYELOGRAM, URETEROSCOPY AND STENT PLACEMENT Left 10/16/2020  ? Procedure: CYSTOSCOPY WITH LEFT RETROGRADE PYELOGRAM, LEFT URETEROSCOPY  WITH LASER AND LEFT URETERAL STENT EXCHANGE;  Surgeon: MCleon Gustin MD;  Location[facility];  Service: Urology;  Laterality: Left;  ? HOLMIUM LASER APPLICATION Left 59/73/5329 ? Procedure: HOLMIUM LASER APPLICATION;  Surgeon: MCleon Gustin MD;  Location[facility];  Service: Urology;  Laterality: Left;  ?  KIDNEY STONE SURGERY    ? kidney stones    ? LASER ABLATION CONDOLAMATA N/A 10/31/2020  ? Procedure: LASER ABLATION OF THE CERVIX;  Surgeon: Florian Buff, MD;  Location: [facility];  Service: Gynecology;  Laterality: N/A;  ? ? ?SH: ?Social History  ? ?Tobacco Use  ? Smoking status: Never  ?  Passive exposure: Yes  ? Smokeless tobacco: Never  ?Vaping Use  ? Vaping Use: Never used  ?Substance Use Topics  ? Alcohol use: No  ?  Drug use: No  ? ? ?ROS: ?Constitutional:  Negative for fever, chills, weight loss ?CV: Negative for chest pain ?Respiratory:  Negative for shortness of breath, wheezing, sleep apnea, frequent cough ?GI:  Negative for nausea, vomiting, bloody stool, GERD ? ?PE: ?BP 138/81   Pulse 96   Ht '5\' 3"'$  (1.6 m)   Wt 215 lb (97.5 kg)   BMI 38.09 kg/m?  ?GENERAL APPEARANCE:  Well appearing, well developed, well nourished, NAD ?HEENT:  Atraumatic, normocephalic ?NECK:  Supple. Trachea midline ?ABDOMEN:  Soft, non-tender, no masses ?EXTREMITIES:  Moves all extremities well, without clubbing, cyanosis, or edema ?NEUROLOGIC:  Alert and oriented x 3, normal gait, CN II-XII grossly intact ?MENTAL STATUS:  appropriate ?BACK:  Non-tender to palpation, No CVAT ?SKIN:  Warm, dry, and intact ? ? ?Results: ?Laboratory Data: ?Lab Results  ?Component Value Date  ? WBC 7.2 10/30/2020  ? HGB 12.6 10/30/2020  ? HCT 39.8 10/30/2020  ? MCV 89.4 10/30/2020  ? PLT 410 (H) 10/30/2020  ? ? ?Lab Results  ?Component Value Date  ? CREATININE 1.53 (H) 10/30/2020  ? ? ?Lab Results  ?Component Value Date  ? HGBA1C 5.1 04/03/2020  ? ? ?Urinalysis ?   ?Component Value Date/Time  ? [place] 10/30/2020 1355  ? APPEARANCEUR Cloudy (A) 03/28/2021 1413  ? LABSPEC 1.025 10/30/2020 1355  ? PHURINE 5.0 10/30/2020 1355  ? GLUCOSEU Negative 03/28/2021 1413  ? HGBUR NEGATIVE 10/30/2020 1355  ? BILIRUBINUR Negative 03/28/2021 1413  ? [place] NEGATIVE 10/30/2020 1355  ? PROTEINUR 1+ (A) 03/28/2021 1413  ? PROTEINUR 30 (A) 10/30/2020 1355  ? UROBILINOGEN 0.2 01/17/2009 0124  ? NITRITE Positive (A) 03/28/2021 1413  ? NITRITE NEGATIVE 10/30/2020 1355  ? LEUKOCYTESUR 1+ (A) 03/28/2021 1413  ? LEUKOCYTESUR SMALL (A) 10/30/2020 1355  ? ? ?Lab Results  ?Component Value Date  ? LABMICR See below: 03/28/2021  ? WBCUA 11-30 (A) 03/28/2021  ? LABEPIT 0-10 03/28/2021  ? MUCUS Present 03/28/2021  ? BACTERIA Many (A) 03/28/2021  ? ? ?Pertinent Imaging: ?Results for  orders placed during the hospital encounter of 08/28/20 ? ?Abdomen 1 view (KUB) ? ?Narrative ?CLINICAL DATA:  Nephrolithiasis ? ?EXAM: ?ABDOMEN - 1 VIEW ? ?COMPARISON:  CT 08/21/2020 ? ?FINDINGS: ?The bowel gas pattern is normal. 8 mm stone again seen projecting ?over the left renal shadow. No right sided renal calculi, which is ?partially obscured by overlying bowel gas and stool. Moderate volume ?stool throughout the colon. No acute osseous findings. Other ?significant radiographic abnormality are seen. ? ?IMPRESSION: ?8 mm stone projecting over the left renal shadow, similar to prior ?exam. ? ? ?Electronically Signed ?By: Davina Poke D.O. ?On: 08/29/2020 10:41 ? ?No results found for this or any previous visit. ? ?No results found for this or any previous visit. ? ?No results found for this or any previous visit. ? ?Results for orders placed during the hospital encounter of 03/18/21 ? ?Ultrasound renal complete ? ?Narrative ?CLINICAL DATA:  Nephrolithiasis ? ?  EXAM: ?RENAL / URINARY TRACT ULTRASOUND COMPLETE ? ?COMPARISON:  12/07/2020 ? ?FINDINGS: ?Right Kidney: ? ?Renal measurements: 9.5 x 4.9 x 4.6 cm = volume: 112 mL. Renal ?cortical echogenicity has improved and is now normal. Cortical ?thickness is preserved. No hydronephrosis. No intrarenal masses are ?seen. A 6 mm nonobstructing calculus is seen within the upper pole ?of the right kidney demonstrating twinkle artifact. ? ?Left Kidney: ? ?Renal measurements: 8.5 x 4.0 x 3.4 cm = volume: 60 mL. There is ?marked left renal cortical atrophy, progressive since prior ?examination with diffusely increased renal cortical echogenicity ?again noted. 13 mm nonobstructing calculus noted within the lower ?pole of the left kidney. No hydronephrosis. No intrarenal masses are ?seen. ? ?Bladder: ? ?Appears normal for degree of bladder distention. Bilateral ureteral ?jets again identified. ? ?Other: ? ?None. ? ?IMPRESSION: ?Normal right renal cortical echogenicity,  improved since prior ?examination. ? ?Progressive, marked atrophy of the left kidney. ? ?Bilateral nonobstructing nephrolithiasis ? ? ?Electronically Signed ?By: Fidela Salisbury M.D. ?On: 03/19/2021 19:49 ? ?No results

## 2021-08-26 ENCOUNTER — Other Ambulatory Visit: Payer: Self-pay | Admitting: Physician Assistant

## 2021-08-27 ENCOUNTER — Telehealth: Payer: Self-pay

## 2021-08-27 NOTE — Telephone Encounter (Signed)
Pt aware, will finish antibiotic.  ?

## 2021-08-29 DIAGNOSIS — I10 Essential (primary) hypertension: Secondary | ICD-10-CM | POA: Diagnosis not present

## 2021-08-30 ENCOUNTER — Ambulatory Visit (HOSPITAL_COMMUNITY)
Admission: RE | Admit: 2021-08-30 | Discharge: 2021-08-30 | Disposition: A | Discharge status: Home / Self Care | Payer: Medicare HMO | Source: Ambulatory Visit | Attending: Urology | Admitting: Urology

## 2021-08-30 DIAGNOSIS — N261 Atrophy of kidney (terminal): Secondary | ICD-10-CM | POA: Diagnosis not present

## 2021-08-30 DIAGNOSIS — N200 Calculus of kidney: Secondary | ICD-10-CM | POA: Diagnosis not present

## 2021-08-31 ENCOUNTER — Other Ambulatory Visit (HOSPITAL_COMMUNITY)
Admission: RE | Admit: 2021-08-31 | Discharge: 2021-08-31 | Disposition: A | Discharge status: Home / Self Care | Payer: Medicare HMO | Source: Other Acute Inpatient Hospital | Attending: Nephrology | Admitting: Nephrology

## 2021-08-31 DIAGNOSIS — N200 Calculus of kidney: Secondary | ICD-10-CM | POA: Diagnosis not present

## 2021-08-31 DIAGNOSIS — N19 Unspecified kidney failure: Secondary | ICD-10-CM | POA: Diagnosis not present

## 2021-08-31 DIAGNOSIS — N1831 Chronic kidney disease, stage 3a: Secondary | ICD-10-CM | POA: Insufficient documentation

## 2021-08-31 DIAGNOSIS — N170 Acute kidney failure with tubular necrosis: Secondary | ICD-10-CM | POA: Diagnosis not present

## 2021-08-31 DIAGNOSIS — I129 Hypertensive chronic kidney disease with stage 1 through stage 4 chronic kidney disease, or unspecified chronic kidney disease: Secondary | ICD-10-CM | POA: Diagnosis not present

## 2021-08-31 LAB — RENAL FUNCTION PANEL
Albumin: 3.9 g/dL (ref 3.5–5.0)
Anion gap: 11 (ref 5–15)
BUN: 23 mg/dL — ABNORMAL HIGH (ref 6–20)
CO2: 24 mmol/L (ref 22–32)
Calcium: 9.1 mg/dL (ref 8.9–10.3)
Chloride: 103 mmol/L (ref 98–111)
Creatinine, Ser: 1.52 mg/dL — ABNORMAL HIGH (ref 0.44–1.00)
GFR, Estimated: 42 mL/min — ABNORMAL LOW (ref >60)
Glucose, Bld: 99 mg/dL (ref 70–99)
Phosphorus: 4.4 mg/dL (ref 2.5–4.6)
Potassium: 3.4 mmol/L — ABNORMAL LOW (ref 3.5–5.1)
Sodium: 138 mmol/L (ref 135–145)

## 2021-09-03 ENCOUNTER — Ambulatory Visit (INDEPENDENT_AMBULATORY_CARE_PROVIDER_SITE_OTHER): Payer: Medicare HMO | Admitting: Internal Medicine

## 2021-09-03 ENCOUNTER — Encounter: Payer: Self-pay | Admitting: Internal Medicine

## 2021-09-03 VITALS — BP 138/88 | HR 99 | Resp 18 | Ht 63.0 in | Wt 219.8 lb

## 2021-09-03 DIAGNOSIS — E038 Other specified hypothyroidism: Secondary | ICD-10-CM

## 2021-09-03 DIAGNOSIS — N200 Calculus of kidney: Secondary | ICD-10-CM

## 2021-09-03 DIAGNOSIS — F3170 Bipolar disorder, currently in remission, most recent episode unspecified: Secondary | ICD-10-CM | POA: Diagnosis not present

## 2021-09-03 DIAGNOSIS — I10 Essential (primary) hypertension: Secondary | ICD-10-CM | POA: Diagnosis not present

## 2021-09-03 DIAGNOSIS — N1832 Chronic kidney disease, stage 3b: Secondary | ICD-10-CM | POA: Diagnosis not present

## 2021-09-03 DIAGNOSIS — R7303 Prediabetes: Secondary | ICD-10-CM

## 2021-09-03 DIAGNOSIS — E039 Hypothyroidism, unspecified: Secondary | ICD-10-CM

## 2021-09-03 DIAGNOSIS — E782 Mixed hyperlipidemia: Secondary | ICD-10-CM | POA: Diagnosis not present

## 2021-09-03 HISTORY — DX: Bipolar disorder, currently in remission, most recent episode unspecified: F31.70

## 2021-09-03 HISTORY — DX: Calculus of kidney: N20.0

## 2021-09-03 NOTE — Patient Instructions (Signed)
Please continue to take medications as prescribed. ? ?Please continue to follow low salt diet and ambulate as tolerated. ? ?Please get fasting blood tests done at [facility]. ?

## 2021-09-03 NOTE — Assessment & Plan Note (Signed)
Likely due to uncontrolled HTN in the past ?Followed by Nephrology ?On Chlorthalidone ?Recently held Losartan due to AKI on CKD ?Avoid nephrotoxic agents ?

## 2021-09-03 NOTE — Assessment & Plan Note (Signed)
Chart review suggests mildly elevated TSH only once in the past ?She was on NP thyroid in the past, will check TSH and free T4 ?

## 2021-09-03 NOTE — Assessment & Plan Note (Signed)
Followed by urology ?Has had surgical extraction of nephrolithiasis in the past ?Currently undergoing treatment for UTI, has had recurrent UTI as well ?

## 2021-09-03 NOTE — Assessment & Plan Note (Addendum)
BP Readings from Last 1 Encounters:  09/03/21 138/88   Well-controlled with amlodipine, Coreg, clonidine and chlorthalidone Followed by Nephrology -recently held losartan due to AKI Counseled for compliance with the medications Advised DASH diet and moderate exercise/walking, at least 150 mins/week

## 2021-09-03 NOTE — Progress Notes (Addendum)
New Patient Office Visit  Subjective:  Patient ID: [name], female    DOB: 16-May-1971  Age: 51 y.o. MRN: [id number]  CC:  Chief Complaint  Patient presents with   New Patient (Initial Visit)    New patient was seeing [facility] then [facility]     HPI [name] is a 51 y.o. female with past medical history of HTN, CKD stage III, bipolar disorder and nephrolithiasis who presents for establishing care.  HTN: She has history of uncontrolled HTN in the past.  She is currently on amlodipine, Coreg, clonidine and chlorthalidone.  Her losartan was recently held due to AKI on CKD.  She follows up with nephrology for history of CKD stage III.  She has history of recurrent nephrolithiasis and recurrent UTI. Followed by urology.  She is currently taking Augmentin for UTI. She currently denies any dysuria or hematuria.  She used to take NP thyroid for questionable hypothyroidism.  She has run out of her NP thyroid for more than 6 months.  She denies any recent worsening of fatigue, changes in weight or appetite.  She has history of bipolar disorder, for which she sees Dr. Dorethea Clan in [place].  Denies any SI or HI currently.  Past Medical History:  Diagnosis Date   Anxiety    Chronic kidney disease    CKD (chronic kidney disease) stage 3, GFR 30-59 ml/min (HCC) 03/15/2019   Depression    Diarrhea 05/04/2017   Essential hypertension, benign 03/15/2019   Hypertension    Hypothyroidism    Hypothyroidism, adult 03/15/2019   Kidney stones    LGSIL on Pap smear of cervix 08/17/2020   Needs colpo per [facility]P immediate risk is 4.3 for CIN 3+risk    Obesity (BMI 30-39.9) 03/15/2019   Renal disorder    Vitamin D deficiency disease 03/15/2019    Past Surgical History:  Procedure Laterality Date   BIOPSY  06/05/2017   Procedure: BIOPSY;  Surgeon: Rogene Houston, MD;  [facility];  Service: Endoscopy;;  colon   CHOLECYSTECTOMY     COLONOSCOPY N/A 06/05/2017    Procedure: COLONOSCOPY;  Surgeon: Rogene Houston, MD;  [facility];  Service: Endoscopy;  Laterality: N/A;  8:30   CYSTOSCOPY WITH RETROGRADE PYELOGRAM, URETEROSCOPY AND STENT PLACEMENT Left 10/04/2020   Procedure: CYSTOSCOPY WITH LEFT RETROGRADE PYELOGRAM, DIAGNOSTIC LEFT URETEROSCOPY AND LEFT URETEREAL STENT PLACEMENT;  Surgeon: Cleon Gustin, MD;  Location: [facility];  Service: Urology;  Laterality: Left;   CYSTOSCOPY WITH RETROGRADE PYELOGRAM, URETEROSCOPY AND STENT PLACEMENT Left 10/16/2020   Procedure: CYSTOSCOPY WITH LEFT RETROGRADE PYELOGRAM, LEFT URETEROSCOPY  WITH LASER AND LEFT URETERAL STENT EXCHANGE;  Surgeon: Cleon Gustin, MD;  Location: [facility];  Service: Urology;  Laterality: Left;   HOLMIUM LASER APPLICATION Left 0/17/4944   Procedure: HOLMIUM LASER APPLICATION;  Surgeon: Cleon Gustin, MD;  Location: [facility];  Service: Urology;  Laterality: Left;   KIDNEY STONE SURGERY     kidney stones     LASER ABLATION CONDOLAMATA N/A 10/31/2020   Procedure: LASER ABLATION OF THE CERVIX;  Surgeon: Florian Buff, MD;  Location: [facility];  Service: Gynecology;  Laterality: N/A;    Family History  Problem Relation Age of Onset   Cancer Paternal Grandmother    Heart failure Paternal Grandmother    Cancer Maternal Grandmother    Cancer Father    Heart failure Father    Lung cancer Mother    Colon cancer  Neg Hx     Social History   Socioeconomic History   Marital status: Divorced    Spouse name: Not on file   Number of children: Not on file   Years of education: Not on file   Highest education level: Not on file  Occupational History   Not on file  Tobacco Use   Smoking status: Never    Passive exposure: Yes   Smokeless tobacco: Never  Vaping Use   Vaping Use: Never used  Substance and Sexual Activity   Alcohol use: No   Drug use: No   Sexual activity: Not Currently    Birth control/protection: Injection  Other Topics Concern   Not on file  Social  History Narrative   Divorced twice,1st lasted 5 years,2nd 4 years.On disabilty secondary to mental illness since 2004.Previously used to [employer]Lives alone,has 2 cats.   Social Determinants of Health   Financial Resource Strain: Medium Risk (08/06/2020)   Overall Financial Resource Strain (CARDIA)    Difficulty of Paying Living Expenses: Somewhat hard  Food Insecurity: No Food Insecurity (08/06/2020)   Hunger Vital Sign    Worried About Running Out of Food in the Last Year: Never true    Ran Out of Food in the Last Year: Never true  Transportation Needs: No Transportation Needs (08/06/2020)   PRAPARE - Hydrologist (Medical): No    Lack of Transportation (Non-Medical): No  Physical Activity: Insufficiently Active (08/06/2020)   Exercise Vital Sign    Days of Exercise per Week: 2 days    Minutes of Exercise per Session: 10 min  Stress: Stress Concern Present (08/06/2020)   Cecilia    Feeling of Stress : To some extent  Social Connections: Socially Isolated (08/06/2020)   Social Connection and Isolation Panel [NHANES]    Frequency of Communication with Friends and Family: More than three times a week    Frequency of Social Gatherings with Friends and Family: Three times a week    Attends Religious Services: Never    Active Member of Clubs or Organizations: No    Attends Archivist Meetings: Never    Marital Status: Divorced  Human resources officer Violence: Not At Risk (08/06/2020)   Humiliation, Afraid, Rape, and Kick questionnaire    Fear of Current or Ex-Partner: No    Emotionally Abused: No    Physically Abused: No    Sexually Abused: No    ROS Review of Systems  Constitutional:  Negative for chills and fever.  HENT:  Negative for congestion, sinus pressure, sinus pain and sore throat.   Eyes:  Negative for pain and discharge.  Respiratory:  Negative for cough and  shortness of breath.   Cardiovascular:  Negative for chest pain and palpitations.  Gastrointestinal:  Negative for abdominal pain, constipation, diarrhea, nausea and vomiting.  Endocrine: Negative for polydipsia and polyuria.  Genitourinary:  Negative for dysuria and hematuria.  Musculoskeletal:  Negative for neck pain and neck stiffness.  Skin:  Negative for rash.  Neurological:  Negative for dizziness and weakness.  Psychiatric/Behavioral:  Negative for agitation and behavioral problems.     Objective:   Today's Vitals: BP 138/88 (BP Location: Left Arm, Patient Position: Sitting, Cuff Size: Normal)   Pulse 99   Resp 18   Ht '5\' 3"'$  (1.6 m)   Wt 219 lb 12.8 oz (99.7 kg)   SpO2 97%   BMI 38.94 kg/m  Physical Exam Vitals reviewed.  Constitutional:      General: She is not in acute distress.    Appearance: She is not diaphoretic.  HENT:     Head: Normocephalic and atraumatic.     Nose: Nose normal.     Mouth/Throat:     Mouth: Mucous membranes are moist.  Eyes:     General: No scleral icterus.    Extraocular Movements: Extraocular movements intact.  Cardiovascular:     Rate and Rhythm: Normal rate and regular rhythm.     Pulses: Normal pulses.     Heart sounds: Normal heart sounds. No murmur heard. Pulmonary:     Breath sounds: Normal breath sounds. No wheezing or rales.  Musculoskeletal:     Cervical back: Neck supple. No tenderness.     Right lower leg: No edema.     Left lower leg: No edema.  Skin:    General: Skin is warm.     Findings: No rash.  Neurological:     General: No focal deficit present.     Mental Status: She is alert and oriented to person, place, and time.  Psychiatric:        Mood and Affect: Mood normal.        Behavior: Behavior normal.     Assessment & Plan:   Problem List Items Addressed This Visit       Cardiovascular and Mediastinum   Essential hypertension, benign    BP Readings from Last 1 Encounters:  09/03/21 138/88   Well-controlled with amlodipine, Coreg, clonidine and chlorthalidone Followed by Nephrology -recently held losartan due to AKI Counseled for compliance with the medications Advised DASH diet and moderate exercise/walking, at least 150 mins/week        Endocrine   Subclinical hypothyroidism - Primary    Chart review suggests mildly elevated TSH only once in the past She was on NP thyroid in the past, will check TSH and free T4      Relevant Orders   TSH + free T4 (Completed)     Genitourinary   CKD (chronic kidney disease) stage 3, GFR 30-59 ml/min (HCC)    Likely due to uncontrolled HTN in the past Followed by Nephrology On Chlorthalidone Recently held Losartan due to AKI on CKD Avoid nephrotoxic agents      Recurrent nephrolithiasis    Followed by urology Has had surgical extraction of nephrolithiasis in the past Currently undergoing treatment for UTI, has had recurrent UTI as well        Other   Bipolar disorder in partial remission ([place])    Followed by psychiatry On Abilify, doxepin, Lamictal and Seroquel On amantadine for tremors Takes Xanax as needed for anxiety      Mixed hyperlipidemia   Relevant Orders   Lipid Profile (Completed)   Other Visit Diagnoses     Prediabetes       Relevant Orders   Hemoglobin A1c (Completed)       Outpatient Encounter Medications as of 09/03/2021  Medication Sig   ALPRAZolam (XANAX) 1 MG tablet Take 1 mg by mouth 3 (three) times daily as needed for anxiety.    amantadine (SYMMETREL) 100 MG capsule Take 100 mg by mouth at bedtime.   amLODipine (NORVASC) 5 MG tablet Take 1 tablet (5 mg total) by mouth in the morning and at bedtime.   ARIPiprazole (ABILIFY) 5 MG tablet Take 5 mg by mouth at bedtime.   carvedilol (COREG) 25 MG tablet Take 25 mg by  mouth 2 (two) times daily.   chlorthalidone (HYGROTON) 25 MG tablet Take by mouth.   Cholecalciferol (VITAMIN D3) 125 MCG (5000 UT) TABS Take 10,000 Units by mouth daily.    cloNIDine (CATAPRES) 0.3 MG tablet Take 1 tablet (0.3 mg total) by mouth 2 (two) times daily.   doxepin (SINEQUAN) 75 MG capsule Take 225 mg by mouth at bedtime.    lamoTRIgine (LAMICTAL) 200 MG tablet Take 200 mg by mouth 2 (two) times daily.   medroxyPROGESTERone (DEPO-PROVERA) 150 MG/ML injection Inject 1 mL (150 mg total) into the muscle every 3 (three) months.   potassium citrate (UROCIT-K) 5 MEQ (540 MG) SR tablet Take 10 mEq by mouth 3 (three) times daily.   QUEtiapine (SEROQUEL XR) 400 MG 24 hr tablet Take 400 mg by mouth at bedtime.    [DISCONTINUED] amoxicillin-clavulanate (AUGMENTIN) 875-125 MG tablet Take 1 tablet by mouth every 12 (twelve) hours. (Patient not taking: Reported on 09/12/2021)   losartan (COZAAR) 25 MG tablet Take 25 mg by mouth in the morning.   [DISCONTINUED] albuterol (VENTOLIN HFA) 108 (90 Base) MCG/ACT inhaler Inhale 1-2 puffs into the lungs every 6 (six) hours as needed for wheezing or shortness of breath. (Patient not taking: Reported on 08/22/2021)   [DISCONTINUED] cyclobenzaprine (FLEXERIL) 5 MG tablet Take 1 tablet (5 mg total) by mouth 3 (three) times daily as needed for muscle spasms. (Patient not taking: Reported on 08/22/2021)   [DISCONTINUED] diphenhydrAMINE (BENADRYL) 25 MG tablet Take 25 mg by mouth at bedtime. *take with pain medication to prevent itching* (Patient not taking: Reported on 08/22/2021)   [DISCONTINUED] HYDROmorphone (DILAUDID) 2 MG tablet Take 1 tablet (2 mg total) by mouth every 4 (four) hours as needed for moderate pain or severe pain. (Patient not taking: Reported on 08/22/2021)   [DISCONTINUED] ketorolac (TORADOL) 10 MG tablet Take 1 tablet (10 mg total) by mouth every 8 (eight) hours as needed. (Patient not taking: Reported on 08/22/2021)   [DISCONTINUED] medroxyPROGESTERone (DEPO-PROVERA) 150 MG/ML injection Inject 1 mL (150 mg total) into the muscle every 3 (three) months. (Patient not taking: Reported on 08/22/2021)   [DISCONTINUED]  mirabegron ER (MYRBETRIQ) 25 MG TB24 tablet Take 1 tablet (25 mg total) by mouth daily. (Patient not taking: Reported on 08/22/2021)   [DISCONTINUED] NP THYROID 60 MG tablet TAKE 1 TABLET (60 MG TOTAL) BY MOUTH DAILY BEFORE BREAKFAST. (Patient not taking: Reported on 08/22/2021)   [DISCONTINUED] ondansetron (ZOFRAN ODT) 8 MG disintegrating tablet Take 1 tablet (8 mg total) by mouth every 8 (eight) hours as needed for nausea or vomiting. (Patient not taking: Reported on 08/22/2021)   [DISCONTINUED] oxyCODONE-acetaminophen (PERCOCET) 10-325 MG tablet Take 1 tablet by mouth every 4 (four) hours as needed for pain. (Patient not taking: Reported on 08/22/2021)   [DISCONTINUED] oxyCODONE-acetaminophen (PERCOCET/ROXICET) 5-325 MG tablet Take 1-2 tablets by mouth every 6 (six) hours as needed for severe pain.   No facility-administered encounter medications on file as of 09/03/2021.    Follow-up: Return in about 6 months (around 03/05/2022) for HTN and CKD.   Lindell Spar, MD

## 2021-09-03 NOTE — Assessment & Plan Note (Signed)
Followed by psychiatry ?On Abilify, doxepin, Lamictal and Seroquel ?On amantadine for tremors ?Takes Xanax as needed for anxiety ?

## 2021-09-04 DIAGNOSIS — E782 Mixed hyperlipidemia: Secondary | ICD-10-CM | POA: Diagnosis not present

## 2021-09-04 DIAGNOSIS — E038 Other specified hypothyroidism: Secondary | ICD-10-CM | POA: Diagnosis not present

## 2021-09-04 DIAGNOSIS — R7303 Prediabetes: Secondary | ICD-10-CM | POA: Diagnosis not present

## 2021-09-05 ENCOUNTER — Other Ambulatory Visit: Payer: Self-pay | Admitting: Internal Medicine

## 2021-09-05 DIAGNOSIS — E782 Mixed hyperlipidemia: Secondary | ICD-10-CM

## 2021-09-05 HISTORY — DX: Mixed hyperlipidemia: E78.2

## 2021-09-05 LAB — LIPID PANEL
Cholesterol: 244 mg/dL — ABNORMAL HIGH (ref <200)
HDL: 44 mg/dL — ABNORMAL LOW (ref >=50)
LDL Cholesterol (Calc): 161 mg/dL (calc) — ABNORMAL HIGH
Non-HDL Cholesterol (Calc): 200 mg/dL (calc) — ABNORMAL HIGH (ref <130)
Total CHOL/HDL Ratio: 5.5 (calc) — ABNORMAL HIGH (ref <5.0)
Triglycerides: 218 mg/dL — ABNORMAL HIGH (ref <150)

## 2021-09-05 LAB — HEMOGLOBIN A1C
Hgb A1c MFr Bld: 5.2 % of total Hgb (ref <5.7)
Mean Plasma Glucose: 103 mg/dL
eAG (mmol/L): 5.7 mmol/L

## 2021-09-05 LAB — TSH+FREE T4: TSH W/REFLEX TO FT4: 3.76 mIU/L

## 2021-09-05 MED ORDER — ROSUVASTATIN CALCIUM 10 MG PO TABS: 10.0000 mg | ORAL_TABLET | Freq: Every day | ORAL | 1 refills | Status: DC

## 2021-09-09 ENCOUNTER — Encounter (HOSPITAL_COMMUNITY): Payer: Self-pay | Admitting: Radiology

## 2021-09-09 ENCOUNTER — Ambulatory Visit (HOSPITAL_COMMUNITY)
Admission: RE | Admit: 2021-09-09 | Discharge: 2021-09-09 | Disposition: A | Discharge status: Home / Self Care | Payer: Medicare HMO | Source: Ambulatory Visit | Attending: Physician Assistant | Admitting: Physician Assistant

## 2021-09-09 ENCOUNTER — Encounter: Payer: Self-pay | Admitting: Internal Medicine

## 2021-09-09 DIAGNOSIS — R310 Gross hematuria: Secondary | ICD-10-CM | POA: Insufficient documentation

## 2021-09-09 DIAGNOSIS — N200 Calculus of kidney: Secondary | ICD-10-CM | POA: Diagnosis not present

## 2021-09-09 DIAGNOSIS — N261 Atrophy of kidney (terminal): Secondary | ICD-10-CM | POA: Diagnosis not present

## 2021-09-09 DIAGNOSIS — N3020 Other chronic cystitis without hematuria: Secondary | ICD-10-CM | POA: Diagnosis not present

## 2021-09-09 MED ORDER — IOHEXOL 300 MG/ML  SOLN
100.0000 mL | Freq: Once | INTRAMUSCULAR | Status: AC | PRN
  Administered 2021-09-09: 75 mL via INTRAVENOUS

## 2021-09-09 NOTE — Telephone Encounter (Signed)
Pt advised of lab results with verbal understanding  

## 2021-09-10 ENCOUNTER — Telehealth: Payer: Self-pay

## 2021-09-10 NOTE — Telephone Encounter (Signed)
Returned patient call- no answer.  ? ?Available appts tomorrow and Thursday if patient returns call back to office.  ?Patient has history of kidney stones ?

## 2021-09-10 NOTE — Telephone Encounter (Signed)
Called patient to inform her of her results, she is asking if her results still show infection.  She is having the sensation of needing to void all the time but not able to, also having lower back pain on the right side. Please advise.  ?

## 2021-09-10 NOTE — Telephone Encounter (Signed)
Appt scheduled for 04/13 at 9:30 with patient ?

## 2021-09-12 ENCOUNTER — Ambulatory Visit: Payer: Medicare HMO | Admitting: Physician Assistant

## 2021-09-12 VITALS — BP 142/84 | HR 98 | Ht 63.0 in | Wt 220.0 lb

## 2021-09-12 DIAGNOSIS — N1831 Chronic kidney disease, stage 3a: Secondary | ICD-10-CM | POA: Diagnosis not present

## 2021-09-12 DIAGNOSIS — N3080 Other cystitis without hematuria: Secondary | ICD-10-CM | POA: Diagnosis not present

## 2021-09-12 DIAGNOSIS — I129 Hypertensive chronic kidney disease with stage 1 through stage 4 chronic kidney disease, or unspecified chronic kidney disease: Secondary | ICD-10-CM | POA: Diagnosis not present

## 2021-09-12 DIAGNOSIS — N170 Acute kidney failure with tubular necrosis: Secondary | ICD-10-CM | POA: Diagnosis not present

## 2021-09-12 DIAGNOSIS — N390 Urinary tract infection, site not specified: Secondary | ICD-10-CM

## 2021-09-12 DIAGNOSIS — R3915 Urgency of urination: Secondary | ICD-10-CM | POA: Diagnosis not present

## 2021-09-12 DIAGNOSIS — N19 Unspecified kidney failure: Secondary | ICD-10-CM | POA: Diagnosis not present

## 2021-09-12 DIAGNOSIS — N200 Calculus of kidney: Secondary | ICD-10-CM | POA: Diagnosis not present

## 2021-09-12 DIAGNOSIS — R34 Anuria and oliguria: Secondary | ICD-10-CM | POA: Diagnosis not present

## 2021-09-12 DIAGNOSIS — R1032 Left lower quadrant pain: Secondary | ICD-10-CM

## 2021-09-12 LAB — BLADDER SCAN AMB NON-IMAGING: Scan Result: 0

## 2021-09-12 LAB — URINALYSIS, ROUTINE W REFLEX MICROSCOPIC
Bilirubin, UA: NEGATIVE
Glucose, UA: NEGATIVE
Ketones, UA: NEGATIVE
Nitrite, UA: POSITIVE — AB
Protein,UA: NEGATIVE
RBC, UA: NEGATIVE
Specific Gravity, UA: 1.02 (ref 1.005–1.030)
Urobilinogen, Ur: 0.2 mg/dL (ref 0.2–1.0)
pH, UA: 7 (ref 5.0–7.5)

## 2021-09-12 LAB — MICROSCOPIC EXAMINATION: RBC, Urine: NONE SEEN /hpf (ref 0–2)

## 2021-09-12 MED ORDER — DOXYCYCLINE HYCLATE 100 MG PO CAPS: 100.0000 mg | ORAL_CAPSULE | Freq: Two times a day (BID) | ORAL | 0 refills | Status: DC

## 2021-09-12 NOTE — Progress Notes (Signed)
post void residual =0mL 

## 2021-09-12 NOTE — Progress Notes (Signed)
? ?Assessment: ?1. Urinary tract infection without hematuria, site unspecified   ?2. Decreased urination   ?3. Urgency of urination   ?4. Left lower quadrant abdominal pain   ?5. Nephrolithiasis   ? ? ?Plan: ?MDX culture again ordered today.  As she has recently failed Augmentin, will begin doxycycline until culture results available.  Strict ED precautions given.  She will sign medical record release today to get culture results from her nephrologist.  Cystoscopy already scheduled to evaluate recent gross hematuria.  Recommended the patient follow-up with her primary care provider for further assessment of the left lower quadrant pain that does not appear to be related to her urinary system.  As the patient has had multiple culture positive urinary tract infections this past year, we will discuss preventative treatment options with Dr. Alyson Ingles when the MDX culture becomes available. ? ?Chief Complaint: ?No chief complaint on file. ? ? ?HPI: ?[name] is a 51 y.o. female with h/o hematuria and scheduled for cysto later this month who presents for evaluation of persistent LLQ pain with 5 day h/o worsening urinary urgency, frequency and dysuria. No gross hematuria. Abdominal pain is worse at night and has been going on for several months. Nothing worsens or improves. It does not radiate. No fever, chills, NVD. Pt is s/p Total Hysto.  Recent CT hematuria study revealed multiple small bilateral renal calculi without evidence of obstruction.  Ureteral calculi appreciated.  No hydronephrosis.  Atrophic changes noted of the left kidney consistent with previous imaging.  No acute changes.  No masses or contrast-enhancement noted.  Bladder felt to be normal ?At the patient's last visit on 3/23, MDX culture ordered.  The patient was positive for Klebsiella and Enterococcus faecalis, multiresistant.  She was ultimately treated with Augmentin and symptoms of UTI resolved.  She has been evaluated with nephrology since  that visit and treated for another UTI.  Those records unavailable for review.  The patient states that she was culture positive and was treated with Macrobid followed by daily Macrodantin and then placed on Augmentin.  She completed course of Augmentin yesterday. ?PVR=65m ?UA= 11-30 WBCs, 0 RBCs, 0-10 renal cells, nitrite positive ? ? ?Allergies: ?Allergies  ?Allergen Reactions  ? Morphine And Related Itching and Hives  ? Sulfa Antibiotics Swelling  ? Tape Itching and Other (See Comments)  ?  Surgical   ? ? ?PMH: ?Past Medical History:  ?Diagnosis Date  ? Anxiety   ? Chronic kidney disease   ? CKD (chronic kidney disease) stage 3, GFR 30-59 ml/min ([facility] 03/15/2019  ? Depression   ? Diarrhea 05/04/2017  ? Essential hypertension, benign 03/15/2019  ? Hypertension   ? Hypothyroidism   ? Hypothyroidism, adult 03/15/2019  ? Kidney stones   ? LGSIL on Pap smear of cervix 08/17/2020  ? Needs colpo per [facility]P immediate risk is 4.3 for CIN 3+risk   ? Obesity (BMI 30-39.9) 03/15/2019  ? Renal disorder   ? Vitamin D deficiency disease 03/15/2019  ? ? ?PSH: ?Past Surgical History:  ?Procedure Laterality Date  ? BIOPSY  06/05/2017  ? Procedure: BIOPSY;  Surgeon: RRogene Houston MD;  [facility];  Service: Endoscopy;;  colon  ? CHOLECYSTECTOMY    ? COLONOSCOPY N/A 06/05/2017  ? Procedure: COLONOSCOPY;  Surgeon: RRogene Houston MD;  [facility];  Service: Endoscopy;  Laterality: N/A;  8:30  ? CYSTOSCOPY WITH RETROGRADE PYELOGRAM, URETEROSCOPY AND STENT PLACEMENT Left 10/04/2020  ? Procedure: CYSTOSCOPY WITH LEFT RETROGRADE  PYELOGRAM, DIAGNOSTIC LEFT URETEROSCOPY AND LEFT URETEREAL STENT PLACEMENT;  Surgeon: Cleon Gustin, MD;  Location[facility];  Service: Urology;  Laterality: Left;  ? CYSTOSCOPY WITH RETROGRADE PYELOGRAM, URETEROSCOPY AND STENT PLACEMENT Left 10/16/2020  ? Procedure: CYSTOSCOPY WITH LEFT RETROGRADE PYELOGRAM, LEFT URETEROSCOPY  WITH LASER AND LEFT URETERAL STENT EXCHANGE;  Surgeon:  Cleon Gustin, MD;  [facility];  Service: Urology;  Laterality: Left;  ? HOLMIUM LASER APPLICATION Left 2/37/6283  ? Procedure: HOLMIUM LASER APPLICATION;  Surgeon: Cleon Gustin, MD;  Location: [facility];  Service: Urology;  Laterality: Left;  ? KIDNEY STONE SURGERY    ? kidney stones    ? LASER ABLATION CONDOLAMATA N/A 10/31/2020  ? Procedure: LASER ABLATION OF THE CERVIX;  Surgeon: Florian Buff, MD;  Location: [facility];  Service: Gynecology;  Laterality: N/A;  ? ? ?SH: ?Social History  ? ?Tobacco Use  ? Smoking status: Never  ?  Passive exposure: Yes  ? Smokeless tobacco: Never  ?Vaping Use  ? Vaping Use: Never used  ?Substance Use Topics  ? Alcohol use: No  ? Drug use: No  ? ? ?ROS: ?Constitutional:  Negative for fever, chills, weight loss ?CV: Negative for chest pain ?Respiratory:  Negative for shortness of breath, wheezing, sleep apnea, frequent cough ?GI:  Negative for nausea, vomiting, bloody stool, GERD ? ?PE: ?BP (!) 142/84   Pulse 98   Ht '5\' 3"'$  (1.6 m)   Wt 220 lb (99.8 kg)   BMI 38.97 kg/m?  ?GENERAL APPEARANCE:  Well appearing, well developed, well nourished, NAD ?HEENT:  Atraumatic, normocephalic ?NECK:  Supple. Trachea midline ?ABDOMEN:  Soft, left lower quadrant tenderness without rebound or guarding, no masses.  No suprapubic tenderness ?EXTREMITIES:  Moves all extremities well, without clubbing, cyanosis, or edema ?NEUROLOGIC:  Alert and oriented x 3, normal gait, CN II-XII grossly intact ?MENTAL STATUS:  appropriate ?BACK:  Non-tender to palpation, No CVAT ?SKIN:  Warm, dry, and intact ? ? ?Results: ?Laboratory Data: ?Lab Results  ?Component Value Date  ? WBC 7.2 10/30/2020  ? HGB 12.6 10/30/2020  ? HCT 39.8 10/30/2020  ? MCV 89.4 10/30/2020  ? PLT 410 (H) 10/30/2020  ? ? ?Lab Results  ?Component Value Date  ? CREATININE 1.52 (H) 08/31/2021  ? ? ?Lab Results  ?Component Value Date  ? HGBA1C 5.2 09/04/2021  ? ? ?Urinalysis ?   ?Component Value Date/Time  ? [place]  10/30/2020 1355  ? APPEARANCEUR Clear 08/22/2021 1523  ? LABSPEC 1.025 10/30/2020 1355  ? PHURINE 5.0 10/30/2020 1355  ? GLUCOSEU Negative 08/22/2021 1523  ? HGBUR NEGATIVE 10/30/2020 1355  ? BILIRUBINUR Negative 08/22/2021 1523  ? [place] NEGATIVE 10/30/2020 1355  ? PROTEINUR Negative 08/22/2021 1523  ? PROTEINUR 30 (A) 10/30/2020 1355  ? UROBILINOGEN 0.2 01/17/2009 0124  ? NITRITE Positive (A) 08/22/2021 1523  ? NITRITE NEGATIVE 10/30/2020 1355  ? LEUKOCYTESUR 1+ (A) 08/22/2021 1523  ? LEUKOCYTESUR SMALL (A) 10/30/2020 1355  ? ? ?Lab Results  ?Component Value Date  ? LABMICR See below: 08/22/2021  ? WBCUA 11-30 (A) 08/22/2021  ? LABEPIT >10 (A) 08/22/2021  ? MUCUS Present 03/28/2021  ? BACTERIA [place] (A) 08/22/2021  ? ? ?Pertinent Imaging: ?Results for orders placed during the hospital encounter of 08/28/20 ? ?Abdomen 1 view (KUB) ? ?Narrative ?CLINICAL DATA:  Nephrolithiasis ? ?EXAM: ?ABDOMEN - 1 VIEW ? ?COMPARISON:  CT 08/21/2020 ? ?FINDINGS: ?The bowel gas pattern is normal. 8 mm stone again seen projecting ?over the  left renal shadow. No right sided renal calculi, which is ?partially obscured by overlying bowel gas and stool. Moderate volume ?stool throughout the colon. No acute osseous findings. Other ?significant radiographic abnormality are seen. ? ?IMPRESSION: ?8 mm stone projecting over the left renal shadow, similar to prior ?exam. ? ? ?Electronically Signed ?By: Davina Poke D.O. ?On: 08/29/2020 10:41 ? ?No results found for this or any previous visit. ? ?No results found for this or any previous visit. ? ?No results found for this or any previous visit. ? ?Results for orders placed during the hospital encounter of 08/30/21 ? ?Ultrasound renal complete ? ?Narrative ?CLINICAL DATA:  History of nephrolithiasis. ? ?EXAM: ?RENAL / URINARY TRACT ULTRASOUND COMPLETE ? ?COMPARISON:  Ultrasound 03/18/2021. ? ?FINDINGS: ?Right Kidney: ? ?Renal measurements: 10.1 x 5.1 x 4.8 cm = volume: 130.0  mL. ?Echogenicity within normal limits. No mass or hydronephrosis ?visualized. 6 mm nonobstructive renal calculus. ? ?Left Kidney: ? ?Renal measurements: 7.7 x 3.9 x 4.4 cm = volume: 68.9 mL. Left renal ?cortical thinning. No mass or

## 2021-09-16 ENCOUNTER — Other Ambulatory Visit: Payer: Self-pay | Admitting: Urology

## 2021-09-16 ENCOUNTER — Ambulatory Visit (INDEPENDENT_AMBULATORY_CARE_PROVIDER_SITE_OTHER): Payer: Medicare HMO

## 2021-09-16 DIAGNOSIS — Z0000 Encounter for general adult medical examination without abnormal findings: Secondary | ICD-10-CM

## 2021-09-16 DIAGNOSIS — Z780 Asymptomatic menopausal state: Secondary | ICD-10-CM | POA: Diagnosis not present

## 2021-09-16 NOTE — Patient Instructions (Signed)
?  [name] , ?Thank you for taking time to come for your Medicare Wellness Visit. I appreciate your ongoing commitment to your health goals. Please review the following plan we discussed and let me know if I can assist you in the future.  ? ?These are the goals we discussed: ? Goals   ? ?  Blood pressure   ?  Normal blood pressure - <140/90 ?  ? ?  ?  ?This is a list of the screening recommended for you and due dates:  ?Health Maintenance  ?Topic Date Due  ? Zoster (Shingles) Vaccine (1 of 2) Never done  ? Tetanus Vaccine  12/27/2018  ? COVID-19 Vaccine (4 - Booster for Moderna series) 07/11/2020  ? Mammogram  12/27/2021  ? Flu Shot  12/31/2021  ? Colon Cancer Screening  06/05/2022  ? Pap Smear  05/20/2024  ? Hepatitis C Screening: USPSTF Recommendation to screen - Ages 52-79 yo.  Completed  ? HIV Screening  Completed  ? HPV Vaccine  Aged Out  ?  ?

## 2021-09-16 NOTE — Progress Notes (Signed)
? ?Subjective:  ? [name] is a 51 y.o. female who presents for Medicare Annual (Subsequent) preventive examination. ?I connected with  [name] on 09/16/21 by a audio enabled telemedicine application and verified that I am speaking with the correct person using two identifiers. ? ?Patient Location: Home ? ?Provider Location: Office/Clinic ? ?I discussed the limitations of evaluation and management by telemedicine. The patient expressed understanding and agreed to proceed.  ?Review of Systems    ? ?  ? ?   ?Objective:  ?  ?There were no vitals filed for this visit. ?There is no height or weight on file to calculate BMI. ? ? ?  10/30/2020  ?  8:43 AM 10/24/2020  ? 12:25 PM 10/16/2020  ? 10:34 AM 10/15/2020  ? 10:00 AM 10/04/2020  ?  9:44 AM 10/02/2020  ? 11:15 AM 09/16/2019  ? 10:33 AM  ?Advanced Directives  ?Does Patient Have a Medical Advance Directive? No No No No No No No  ?Would patient like information on creating a medical advance directive? No - Patient declined No - Patient declined No - Patient declined No - Patient declined No - Patient declined No - Patient declined No - Patient declined  ? ? ?Current Medications (verified) ?Outpatient Encounter Medications as of 09/16/2021  ?Medication Sig  ? ALPRAZolam (XANAX) 1 MG tablet Take 1 mg by mouth 3 (three) times daily as needed for anxiety.   ? amantadine (SYMMETREL) 100 MG capsule Take 100 mg by mouth at bedtime.  ? amLODipine (NORVASC) 5 MG tablet Take 1 tablet (5 mg total) by mouth in the morning and at bedtime.  ? ARIPiprazole (ABILIFY) 5 MG tablet Take 5 mg by mouth at bedtime.  ? carvedilol (COREG) 25 MG tablet Take 25 mg by mouth 2 (two) times daily.  ? chlorthalidone (HYGROTON) 25 MG tablet Take by mouth. (Patient not taking: Reported on 09/12/2021)  ? Cholecalciferol (VITAMIN D3) 125 MCG (5000 UT) TABS Take 10,000 Units by mouth daily.  ? cloNIDine (CATAPRES) 0.3 MG tablet Take 1 tablet (0.3 mg total) by mouth 2 (two) times daily.  ? doxepin  (SINEQUAN) 75 MG capsule Take 225 mg by mouth at bedtime.   ? doxycycline (VIBRAMYCIN) 100 MG capsule Take 1 capsule (100 mg total) by mouth every 12 (twelve) hours.  ? lamoTRIgine (LAMICTAL) 200 MG tablet Take 200 mg by mouth in the morning.  ? losartan (COZAAR) 25 MG tablet Take 25 mg by mouth in the morning. (Patient not taking: Reported on 09/12/2021)  ? medroxyPROGESTERone (DEPO-PROVERA) 150 MG/ML injection Inject 1 mL (150 mg total) into the muscle every 3 (three) months.  ? potassium citrate (UROCIT-K) 5 MEQ (540 MG) SR tablet Take 10 mEq by mouth 3 (three) times daily.  ? QUEtiapine (SEROQUEL XR) 400 MG 24 hr tablet Take 400 mg by mouth at bedtime.   ? rosuvastatin (CRESTOR) 10 MG tablet Take 1 tablet (10 mg total) by mouth daily. (Patient not taking: Reported on 09/12/2021)  ? ?No facility-administered encounter medications on file as of 09/16/2021.  ? ? ?Allergies (verified) ?Morphine and related, Sulfa antibiotics, and Tape  ? ?History: ?Past Medical History:  ?Diagnosis Date  ? Anxiety   ? Chronic kidney disease   ? CKD (chronic kidney disease) stage 3, GFR 30-59 ml/min ([place]) 03/15/2019  ? Depression   ? Diarrhea 05/04/2017  ? Essential hypertension, benign 03/15/2019  ? Hypertension   ? Hypothyroidism   ? Hypothyroidism, adult 03/15/2019  ? Kidney stones   ?  LGSIL on Pap smear of cervix 08/17/2020  ? Needs colpo per ASCCP immediate risk is 4.3 for CIN 3+risk   ? Obesity (BMI 30-39.9) 03/15/2019  ? Renal disorder   ? Vitamin D deficiency disease 03/15/2019  ? ?Past Surgical History:  ?Procedure Laterality Date  ? BIOPSY  06/05/2017  ? Procedure: BIOPSY;  Surgeon: Rogene Houston, MD;  [facility];  Service: Endoscopy;;  colon  ? CHOLECYSTECTOMY    ? COLONOSCOPY N/A 06/05/2017  ? Procedure: COLONOSCOPY;  Surgeon: Rogene Houston, MD;  [facility];  Service: Endoscopy;  Laterality: N/A;  8:30  ? CYSTOSCOPY WITH RETROGRADE PYELOGRAM, URETEROSCOPY AND STENT PLACEMENT Left 10/04/2020  ?  Procedure: CYSTOSCOPY WITH LEFT RETROGRADE PYELOGRAM, DIAGNOSTIC LEFT URETEROSCOPY AND LEFT URETEREAL STENT PLACEMENT;  Surgeon: Cleon Gustin, MD;  Location: [facility];  Service: Urology;  Laterality: Left;  ? CYSTOSCOPY WITH RETROGRADE PYELOGRAM, URETEROSCOPY AND STENT PLACEMENT Left 10/16/2020  ? Procedure: CYSTOSCOPY WITH LEFT RETROGRADE PYELOGRAM, LEFT URETEROSCOPY  WITH LASER AND LEFT URETERAL STENT EXCHANGE;  Surgeon: Cleon Gustin, MD;  Location: [facility];  Service: Urology;  Laterality: Left;  ? HOLMIUM LASER APPLICATION Left 2/29/7989  ? Procedure: HOLMIUM LASER APPLICATION;  Surgeon: Cleon Gustin, MD;  Location: [facility];  Service: Urology;  Laterality: Left;  ? KIDNEY STONE SURGERY    ? kidney stones    ? LASER ABLATION CONDOLAMATA N/A 10/31/2020  ? Procedure: LASER ABLATION OF THE CERVIX;  Surgeon: Florian Buff, MD;  Location: [facility];  Service: Gynecology;  Laterality: N/A;  ? ?Family History  ?Problem Relation Age of Onset  ? Cancer Paternal Grandmother   ? Heart failure Paternal Grandmother   ? Cancer Maternal Grandmother   ? Cancer Father   ? Heart failure Father   ? Lung cancer Mother   ? Colon cancer Neg Hx   ? ?Social History  ? ?Socioeconomic History  ? Marital status: Divorced  ?  Spouse name: Not on file  ? Number of children: Not on file  ? Years of education: Not on file  ? Highest education level: Not on file  ?Occupational History  ? Not on file  ?Tobacco Use  ? Smoking status: Never  ?  Passive exposure: Yes  ? Smokeless tobacco: Never  ?Vaping Use  ? Vaping Use: Never used  ?Substance and Sexual Activity  ? Alcohol use: No  ? Drug use: No  ? Sexual activity: Not Currently  ?  Birth control/protection: Injection  ?Other Topics Concern  ? Not on file  ?Social History Narrative  ? Divorced twice,1st lasted 5 years,2nd 4 years.On disabilty secondary to mental illness since 2004.Previously used to [employer].Lives alone,has 2 cats.  ? ?Social Determinants of Health   ? ?Financial Resource Strain: Not on file  ?Food Insecurity: Not on file  ?Transportation Needs: Not on file  ?Physical Activity: Not on file  ?Stress: Not on file  ?Social Connections: Not on file  ? ? ?Tobacco Counseling ?Counseling given: Not Answered ? ? ?Clinical Intake: ? ?  ? ?  ? ?  ? ?  ? ?  ? ?Diabetic?no ? ?  ? ?  ? ? ?Activities of Daily Living ? ?  10/30/2020  ?  8:45 AM 10/30/2020  ?  8:42 AM  ?In your present state of health, do you have any difficulty performing the following activities:  ?Hearing?  0  ?Vision?  0  ?Difficulty concentrating or making decisions?  0  ?Walking or climbing stairs?  0  ?Dressing or bathing?  0  ?Doing errands, shopping? 0   ? ? ?Patient Care Team: ?Lindell Spar, MD as PCP - General (Internal Medicine) ? ?Indicate any recent Medical Services you may have received from other than [facility] providers in the past year (date may be approximate). ? ?   ?Assessment:  ? This is a routine wellness examination for [place]. ? ?Hearing/Vision screen ?No results found. ? ?Dietary issues and exercise activities discussed: ?  ? ? Goals Addressed   ?None ?  ? ?Depression Screen ? ?  09/03/2021  ?  1:03 PM 08/06/2020  ?  1:45 PM 07/04/2020  ?  8:01 AM 09/16/2019  ? 10:34 AM  ?PHQ 2/9 Scores  ?PHQ - 2 Score 0 5 0 0  ?PHQ- 9 Score  22 1   ?  ?Fall Risk ? ?  09/03/2021  ?  1:03 PM 08/06/2020  ?  1:45 PM 09/16/2019  ? 10:33 AM  ?Fall Risk   ?Falls in the past year? 1 0 0  ?Number falls in past yr: 1 0 0  ?Injury with Fall? 0 0 0  ?Risk for fall due to : History of fall(s);Impaired balance/gait    ?Follow up Falls evaluation completed;Falls prevention discussed Falls evaluation completed Education provided;Falls prevention discussed;Falls evaluation completed  ? ? ?FALL RISK PREVENTION PERTAINING TO THE HOME: ? ?Any stairs in or around the home? Yes  ?If so, are there any without handrails? No  ?Home free of loose throw rugs in walkways, pet beds, electrical cords, etc? Yes  ?Adequate lighting in your home to  reduce risk of falls? Yes  ? ?ASSISTIVE DEVICES UTILIZED TO PREVENT FALLS: ? ?Life alert? No  ?Use of a cane, walker or w/c? No  ?Grab bars in the bathroom? No  ?Shower chair or bench in shower? No  ?Elevated toilet s

## 2021-09-17 DIAGNOSIS — Z23 Encounter for immunization: Secondary | ICD-10-CM | POA: Diagnosis not present

## 2021-09-18 ENCOUNTER — Telehealth: Payer: Self-pay

## 2021-09-18 ENCOUNTER — Other Ambulatory Visit: Payer: Self-pay | Admitting: Physician Assistant

## 2021-09-18 DIAGNOSIS — Z6841 Body Mass Index (BMI) 40.0 and over, adult: Secondary | ICD-10-CM | POA: Diagnosis not present

## 2021-09-18 DIAGNOSIS — I129 Hypertensive chronic kidney disease with stage 1 through stage 4 chronic kidney disease, or unspecified chronic kidney disease: Secondary | ICD-10-CM | POA: Diagnosis not present

## 2021-09-18 DIAGNOSIS — R809 Proteinuria, unspecified: Secondary | ICD-10-CM | POA: Diagnosis not present

## 2021-09-18 DIAGNOSIS — N170 Acute kidney failure with tubular necrosis: Secondary | ICD-10-CM | POA: Diagnosis not present

## 2021-09-18 DIAGNOSIS — N1832 Chronic kidney disease, stage 3b: Secondary | ICD-10-CM | POA: Diagnosis not present

## 2021-09-18 MED ORDER — NITROFURANTOIN MONOHYD MACRO 100 MG PO CAPS: 100.0000 mg | ORAL_CAPSULE | Freq: Two times a day (BID) | ORAL | 0 refills | Status: DC

## 2021-09-18 NOTE — Progress Notes (Signed)
MDX cx indicates need to change antibx to Macrobid or Augmenting. Macrobid sent to pt pharmacy ?

## 2021-09-18 NOTE — Telephone Encounter (Signed)
Patient called and notified of results and voiced understanding,  ?

## 2021-09-18 NOTE — Telephone Encounter (Signed)
-----   Message from Reynaldo Minium, [place] sent at 09/18/2021 10:09 AM EDT ----- ?Please let the pt know that her Mdx cx grew 3 different bacteria none are sensitive to the Doxy. I am sending a new Rx for Macrobid which will cover all three bacteria. She needs to try to get both doses in today. ?----- Message ----- ?From: Jennette Bill ?Sent: 09/16/2021   3:18 PM EDT ?To: Berneice Heinrich [place], PA-C ? ? ?

## 2021-09-20 ENCOUNTER — Ambulatory Visit: Payer: Medicare Other | Admitting: Urology

## 2021-09-23 ENCOUNTER — Ambulatory Visit (INDEPENDENT_AMBULATORY_CARE_PROVIDER_SITE_OTHER): Payer: Medicare HMO | Admitting: Urology

## 2021-09-23 ENCOUNTER — Encounter: Payer: Self-pay | Admitting: Urology

## 2021-09-23 VITALS — BP 138/87 | HR 91

## 2021-09-23 DIAGNOSIS — Z87440 Personal history of urinary (tract) infections: Secondary | ICD-10-CM

## 2021-09-23 DIAGNOSIS — R3129 Other microscopic hematuria: Secondary | ICD-10-CM | POA: Diagnosis not present

## 2021-09-23 DIAGNOSIS — R8289 Other abnormal findings on cytological and histological examination of urine: Secondary | ICD-10-CM | POA: Diagnosis not present

## 2021-09-23 LAB — MICROSCOPIC EXAMINATION
Epithelial Cells (non renal): >10 /hpf — AB (ref 0–10)
Renal Epithel, UA: NONE SEEN /hpf

## 2021-09-23 LAB — URINALYSIS, ROUTINE W REFLEX MICROSCOPIC
Bilirubin, UA: NEGATIVE
Glucose, UA: NEGATIVE
Ketones, UA: NEGATIVE
Leukocytes,UA: NEGATIVE
Nitrite, UA: NEGATIVE
Specific Gravity, UA: 1.02 (ref 1.005–1.030)
Urobilinogen, Ur: 0.2 mg/dL (ref 0.2–1.0)
pH, UA: 7 (ref 5.0–7.5)

## 2021-09-23 MED ORDER — CIPROFLOXACIN HCL 500 MG PO TABS
500.0000 mg | ORAL_TABLET | Freq: Once | ORAL | Status: AC
  Administered 2021-09-23: 500 mg via ORAL

## 2021-09-23 MED ORDER — NITROFURANTOIN MACROCRYSTAL 50 MG PO CAPS: 50.0000 mg | ORAL_CAPSULE | Freq: Every day | ORAL | 11 refills | Status: DC

## 2021-09-23 NOTE — Patient Instructions (Signed)
Urinary Tract Infection, Adult A urinary tract infection (UTI) is an infection of any part of the urinary tract. The urinary tract includes: The kidneys. The ureters. The bladder. The urethra. These organs make, store, and get rid of pee (urine) in the body. What are the causes? This infection is caused by germs (bacteria) in your genital area. These germs grow and cause swelling (inflammation) of your urinary tract. What increases the risk? The following factors may make you more likely to develop this condition: Using a small, thin tube (catheter) to drain pee. Not being able to control when you pee or poop (incontinence). Being female. If you are female, these things can increase the risk: Using these methods to prevent pregnancy: A medicine that kills sperm (spermicide). A device that blocks sperm (diaphragm). Having low levels of a female hormone (estrogen). Being pregnant. You are more likely to develop this condition if: You have genes that add to your risk. You are sexually active. You take antibiotic medicines. You have trouble peeing because of: A prostate that is bigger than normal, if you are female. A blockage in the part of your body that drains pee from the bladder. A kidney stone. A nerve condition that affects your bladder. Not getting enough to drink. Not peeing often enough. You have other conditions, such as: Diabetes. A weak disease-fighting system (immune system). Sickle cell disease. Gout. Injury of the spine. What are the signs or symptoms? Symptoms of this condition include: Needing to pee right away. Peeing small amounts often. Pain or burning when peeing. Blood in the pee. Pee that smells bad or not like normal. Trouble peeing. Pee that is cloudy. Fluid coming from the vagina, if you are female. Pain in the belly or lower back. Other symptoms include: Vomiting. Not feeling hungry. Feeling mixed up (confused). This may be the first symptom in  older adults. Being tired and grouchy (irritable). A fever. Watery poop (diarrhea). How is this treated? Taking antibiotic medicine. Taking other medicines. Drinking enough water. In some cases, you may need to see a specialist. Follow these instructions at home:  Medicines Take over-the-counter and prescription medicines only as told by your doctor. If you were prescribed an antibiotic medicine, take it as told by your doctor. Do not stop taking it even if you start to feel better. General instructions Make sure you: Pee until your bladder is empty. Do not hold pee for a long time. Empty your bladder after sex. Wipe from front to back after peeing or pooping if you are a female. Use each tissue one time when you wipe. Drink enough fluid to keep your pee pale yellow. Keep all follow-up visits. Contact a doctor if: You do not get better after 1-2 days. Your symptoms go away and then come back. Get help right away if: You have very bad back pain. You have very bad pain in your lower belly. You have a fever. You have chills. You feeling like you will vomit or you vomit. Summary A urinary tract infection (UTI) is an infection of any part of the urinary tract. This condition is caused by germs in your genital area. There are many risk factors for a UTI. Treatment includes antibiotic medicines. Drink enough fluid to keep your pee pale yellow. This information is not intended to replace advice given to you by your health care provider. Make sure you discuss any questions you have with your health care provider. Document Revised: 12/30/2019 Document Reviewed: 12/30/2019 Elsevier Patient Education    2023 Elsevier Inc.  

## 2021-09-23 NOTE — Progress Notes (Signed)
? ?  09/23/21 ? ?CC: dysuria ? ? ?HPI:  ?[name] is a 51yo here for followup for hematuria and recurrent UTI ?Blood pressure 138/87, pulse 91. ?NED. A&Ox3.   ?No respiratory distress   ?Abd soft, NT, ND ?Normal external genitalia with patent urethral meatus ? ?Cystoscopy Procedure Note ? ?Patient identification was confirmed, informed consent was obtained, and patient was prepped using Betadine solution.  Lidocaine jelly was administered per urethral meatus.   ? ?Procedure: ?- Flexible cystoscope introduced, without any difficulty.   ?- Thorough search of the bladder revealed: ?   normal urethral meatus ?   normal urothelium ?   no stones ?   no ulcers  ?   no tumors ?   no urethral polyps ?   no trabeculation ? ?- Ureteral orifices were normal in position and appearance. ? ?Post-Procedure: ?- Patient tolerated the procedure well ? ?Assessment/ Plan: ?-We discussed the natural hx of recurrent UTIs and the various causes. We discussed the treatment options including post coital prophylaxis, daily prophylaxis, topical estrogen therapy. After discussing the options the patient elects for antibiotic prophylaxis. We will start macrobid '50mg'$  qhs. Urine for cytology. RTC 3 months ? ? No follow-ups on file. ? ?Nicolette Bang, MD  ?

## 2021-09-25 LAB — CYTOLOGY, URINE

## 2021-10-02 ENCOUNTER — Encounter: Payer: Self-pay | Admitting: Urology

## 2021-10-02 NOTE — Telephone Encounter (Signed)
Please review urine cytology ?

## 2021-10-04 ENCOUNTER — Encounter: Payer: Self-pay | Admitting: Urology

## 2021-10-04 ENCOUNTER — Ambulatory Visit: Payer: Medicare HMO | Admitting: Urology

## 2021-10-04 VITALS — BP 124/80 | HR 101

## 2021-10-04 DIAGNOSIS — R896 Abnormal cytological findings in specimens from other organs, systems and tissues: Secondary | ICD-10-CM

## 2021-10-04 DIAGNOSIS — R300 Dysuria: Secondary | ICD-10-CM | POA: Diagnosis not present

## 2021-10-04 NOTE — Progress Notes (Signed)
? ?10/04/2021 ?9:02 AM  ? ?[name] ?[birth year] ?[id number] ? ?Referring provider: Lindell Spar, MD ?[address] ?[place],  [place] [address] ? ?Followup dysuria ? ? ?HPI: ?[name] is a 51yo here for followup for dysuria and abnormal cytology. She finished antibiotic therapy and notes resolution of her dysuria. She had an atypical urine cytology last visit but at the time she had an active UTI. NO significant LUTS. No other complaitns today ? ? ?PMH: ?Past Medical History:  ?Diagnosis Date  ? Anxiety   ? Chronic kidney disease   ? CKD (chronic kidney disease) stage 3, GFR 30-59 ml/min ([place]) 03/15/2019  ? Depression   ? Diarrhea 05/04/2017  ? Essential hypertension, benign 03/15/2019  ? Hypertension   ? Hypothyroidism   ? Hypothyroidism, adult 03/15/2019  ? Kidney stones   ? LGSIL on Pap smear of cervix 08/17/2020  ? Needs colpo per ASCCP immediate risk is 4.3 for CIN 3+risk   ? Obesity (BMI 30-39.9) 03/15/2019  ? Renal disorder   ? Vitamin D deficiency disease 03/15/2019  ? ? ?Surgical History: ?Past Surgical History:  ?Procedure Laterality Date  ? BIOPSY  06/05/2017  ? Procedure: BIOPSY;  Surgeon: Rogene Houston, MD;  [facility];  Service: Endoscopy;;  colon  ? CHOLECYSTECTOMY    ? COLONOSCOPY N/A 06/05/2017  ? Procedure: COLONOSCOPY;  Surgeon: Rogene Houston, MD;  [facility];  Service: Endoscopy;  Laterality: N/A;  8:30  ? CYSTOSCOPY WITH RETROGRADE PYELOGRAM, URETEROSCOPY AND STENT PLACEMENT Left 10/04/2020  ? Procedure: CYSTOSCOPY WITH LEFT RETROGRADE PYELOGRAM, DIAGNOSTIC LEFT URETEROSCOPY AND LEFT URETEREAL STENT PLACEMENT;  Surgeon: Cleon Gustin, MD;  Location[facility];  Service: Urology;  Laterality: Left;  ? CYSTOSCOPY WITH RETROGRADE PYELOGRAM, URETEROSCOPY AND STENT PLACEMENT Left 10/16/2020  ? Procedure: CYSTOSCOPY WITH LEFT RETROGRADE PYELOGRAM, LEFT URETEROSCOPY  WITH LASER AND LEFT URETERAL STENT EXCHANGE;  Surgeon: Cleon Gustin, MD;  Location[facility];   Service: Urology;  Laterality: Left;  ? HOLMIUM LASER APPLICATION Left 12/10/6267  ? Procedure: HOLMIUM LASER APPLICATION;  Surgeon: Cleon Gustin, MD;  Location: [facility];  Service: Urology;  Laterality: Left;  ? KIDNEY STONE SURGERY    ? kidney stones    ? LASER ABLATION CONDOLAMATA N/A 10/31/2020  ? Procedure: LASER ABLATION OF THE CERVIX;  Surgeon: Florian Buff, MD;  Location: [facility];  Service: Gynecology;  Laterality: N/A;  ? ? ?Home Medications:  ?Allergies as of 10/04/2021   ? ?   Reactions  ? Morphine And Related Itching, Hives  ? Sulfa Antibiotics Swelling  ? Tape Itching, Other (See Comments)  ? Surgical   ? ?  ? ?  ?Medication List  ?  ? ?  ? Accurate as of Oct 04, 2021  9:02 AM. If you have any questions, ask your nurse or doctor.  ?  ?  ? ?  ? ?ALPRAZolam 1 MG tablet ?Commonly known as: Duanne Moron ?Take 1 mg by mouth 3 (three) times daily as needed for anxiety. ?  ?amantadine 100 MG capsule ?Commonly known as: SYMMETREL ?Take 100 mg by mouth at bedtime. ?  ?amLODipine 5 MG tablet ?Commonly known as: NORVASC ?Take 1 tablet (5 mg total) by mouth in the morning and at bedtime. ?  ?ARIPiprazole 5 MG tablet ?Commonly known as: ABILIFY ?Take 5 mg by mouth at bedtime. ?  ?carvedilol 25 MG tablet ?Commonly known as: COREG ?Take 25 mg by mouth 2 (two) times daily. ?  ?  chlorthalidone 25 MG tablet ?Commonly known as: HYGROTON ?Take by mouth. ?  ?cloNIDine 0.3 MG tablet ?Commonly known as: CATAPRES ?Take 1 tablet (0.3 mg total) by mouth 2 (two) times daily. ?  ?doxepin 75 MG capsule ?Commonly known as: SINEQUAN ?Take 225 mg by mouth at bedtime. ?  ?doxycycline 100 MG capsule ?Commonly known as: VIBRAMYCIN ?Take 1 capsule (100 mg total) by mouth every 12 (twelve) hours. ?  ?lamoTRIgine 200 MG tablet ?Commonly known as: LAMICTAL ?Take 200 mg by mouth in the morning. ?  ?losartan 25 MG tablet ?Commonly known as: COZAAR ?Take 25 mg by mouth in the morning. ?  ?medroxyPROGESTERone 150 MG/ML injection ?Commonly known as:  DEPO-PROVERA ?Inject 1 mL (150 mg total) into the muscle every 3 (three) months. ?  ?nitrofurantoin (macrocrystal-monohydrate) 100 MG capsule ?Commonly known as: MACROBID ?Take 1 capsule (100 mg total) by mouth every 12 (twelve) hours. ?  ?nitrofurantoin 50 MG capsule ?Commonly known as: MACRODANTIN ?Take 1 capsule (50 mg total) by mouth at bedtime. ?  ?potassium citrate 5 MEQ (540 MG) SR tablet ?Commonly known as: UROCIT-K ?Take 10 mEq by mouth 3 (three) times daily. ?  ?QUEtiapine 400 MG 24 hr tablet ?Commonly known as: SEROQUEL XR ?Take 400 mg by mouth at bedtime. ?  ?rosuvastatin 10 MG tablet ?Commonly known as: Crestor ?Take 1 tablet (10 mg total) by mouth daily. ?  ?Vitamin D3 125 MCG (5000 UT) Tabs ?Take 10,000 Units by mouth daily. ?  ? ?  ? ? ?Allergies:  ?Allergies  ?Allergen Reactions  ? Morphine And Related Itching and Hives  ? Sulfa Antibiotics Swelling  ? Tape Itching and Other (See Comments)  ?  Surgical   ? ? ?Family History: ?Family History  ?Problem Relation Age of Onset  ? Cancer Paternal Grandmother   ? Heart failure Paternal Grandmother   ? Cancer Maternal Grandmother   ? Cancer Father   ? Heart failure Father   ? Lung cancer Mother   ? Colon cancer Neg Hx   ? ? ?Social History:  reports that she has never smoked. She has been exposed to tobacco smoke. She has never used smokeless tobacco. She reports that she does not drink alcohol and does not use drugs. ? ?ROS: ?All other review of systems were reviewed and are negative except what is noted above in HPI ? ?Physical Exam: ?BP 124/80   Pulse (!) 101   ?Constitutional:  Alert and oriented, No acute distress. ?HEENT: [place] AT, moist mucus membranes.  Trachea midline, no masses. ?Cardiovascular: No clubbing, cyanosis, or edema. ?Respiratory: Normal respiratory effort, no increased work of breathing. ?GI: Abdomen is soft, nontender, nondistended, no abdominal masses ?GU: No CVA tenderness.  ?Lymph: No cervical or inguinal lymphadenopathy. ?Skin: No  rashes, bruises or suspicious lesions. ?Neurologic: Grossly intact, no focal deficits, moving all 4 extremities. ?Psychiatric: Normal mood and affect. ? ?Laboratory Data: ?Lab Results  ?Component Value Date  ? WBC 7.2 10/30/2020  ? HGB 12.6 10/30/2020  ? HCT 39.8 10/30/2020  ? MCV 89.4 10/30/2020  ? PLT 410 (H) 10/30/2020  ? ? ?Lab Results  ?Component Value Date  ? CREATININE 1.52 (H) 08/31/2021  ? ? ?No results found for: PSA ? ?No results found for: TESTOSTERONE ? ?Lab Results  ?Component Value Date  ? HGBA1C 5.2 09/04/2021  ? ? ?Urinalysis ?   ?Component Value Date/Time  ? [place] 10/30/2020 1355  ? APPEARANCEUR Clear 09/23/2021 1049  ? LABSPEC 1.025 10/30/2020 1355  ? PHURINE  5.0 10/30/2020 1355  ? GLUCOSEU Negative 09/23/2021 1049  ? HGBUR NEGATIVE 10/30/2020 1355  ? BILIRUBINUR Negative 09/23/2021 1049  ? [place] NEGATIVE 10/30/2020 1355  ? PROTEINUR 1+ (A) 09/23/2021 1049  ? PROTEINUR 30 (A) 10/30/2020 1355  ? UROBILINOGEN 0.2 01/17/2009 0124  ? NITRITE Negative 09/23/2021 1049  ? NITRITE NEGATIVE 10/30/2020 1355  ? LEUKOCYTESUR Negative 09/23/2021 1049  ? LEUKOCYTESUR SMALL (A) 10/30/2020 1355  ? ? ?Lab Results  ?Component Value Date  ? LABMICR See below: 09/23/2021  ? WBCUA 6-10 (A) 09/23/2021  ? LABEPIT >10 (A) 09/23/2021  ? MUCUS Present 09/23/2021  ? BACTERIA Few 09/23/2021  ? ? ?Pertinent Imaging: ? ?Results for orders placed during the hospital encounter of 08/28/20 ? ?Abdomen 1 view (KUB) ? ?Narrative ?CLINICAL DATA:  Nephrolithiasis ? ?EXAM: ?ABDOMEN - 1 VIEW ? ?COMPARISON:  CT 08/21/2020 ? ?FINDINGS: ?The bowel gas pattern is normal. 8 mm stone again seen projecting ?over the left renal shadow. No right sided renal calculi, which is ?partially obscured by overlying bowel gas and stool. Moderate volume ?stool throughout the colon. No acute osseous findings. Other ?significant radiographic abnormality are seen. ? ?IMPRESSION: ?8 mm stone projecting over the left renal shadow, similar to  prior ?exam. ? ? ?Electronically Signed ?By: Davina Poke D.O. ?On: 08/29/2020 10:41 ? ?No results found for this or any previous visit. ? ?No results found for this or any previous visit. ? ?No results found f

## 2021-10-04 NOTE — Patient Instructions (Signed)
Urinary Tract Infection, Adult  A urinary tract infection (UTI) is an infection of any part of the urinary tract. The urinary tract includes the kidneys, ureters, bladder, and urethra. These organs make, store, and get rid of urine in the body. An upper UTI affects the ureters and kidneys. A lower UTI affects the bladder and urethra. What are the causes? Most urinary tract infections are caused by bacteria in your genital area around your urethra, where urine leaves your body. These bacteria grow and cause inflammation of your urinary tract. What increases the risk? You are more likely to develop this condition if: You have a urinary catheter that stays in place. You are not able to control when you urinate or have a bowel movement (incontinence). You are female and you: Use a spermicide or diaphragm for birth control. Have low estrogen levels. Are pregnant. You have certain genes that increase your risk. You are sexually active. You take antibiotic medicines. You have a condition that causes your flow of urine to slow down, such as: An enlarged prostate, if you are female. Blockage in your urethra. A kidney stone. A nerve condition that affects your bladder control (neurogenic bladder). Not getting enough to drink, or not urinating often. You have certain medical conditions, such as: Diabetes. A weak disease-fighting system (immunesystem). Sickle cell disease. Gout. Spinal cord injury. What are the signs or symptoms? Symptoms of this condition include: Needing to urinate right away (urgency). Frequent urination. This may include small amounts of urine each time you urinate. Pain or burning with urination. Blood in the urine. Urine that smells bad or unusual. Trouble urinating. Cloudy urine. Vaginal discharge, if you are female. Pain in the abdomen or the lower back. You may also have: Vomiting or a decreased appetite. Confusion. Irritability or tiredness. A fever or  chills. Diarrhea. The first symptom in older adults may be confusion. In some cases, they may not have any symptoms until the infection has worsened. How is this diagnosed? This condition is diagnosed based on your medical history and a physical exam. You may also have other tests, including: Urine tests. Blood tests. Tests for STIs (sexually transmitted infections). If you have had more than one UTI, a cystoscopy or imaging studies may be done to determine the cause of the infections. How is this treated? Treatment for this condition includes: Antibiotic medicine. Over-the-counter medicines to treat discomfort. Drinking enough water to stay hydrated. If you have frequent infections or have other conditions such as a kidney stone, you may need to see a health care provider who specializes in the urinary tract (urologist). In rare cases, urinary tract infections can cause sepsis. Sepsis is a life-threatening condition that occurs when the body responds to an infection. Sepsis is treated in the hospital with IV antibiotics, fluids, and other medicines. Follow these instructions at home:  Medicines Take over-the-counter and prescription medicines only as told by your health care provider. If you were prescribed an antibiotic medicine, take it as told by your health care provider. Do not stop using the antibiotic even if you start to feel better. General instructions Make sure you: Empty your bladder often and completely. Do not hold urine for long periods of time. Empty your bladder after sex. Wipe from front to back after urinating or having a bowel movement if you are female. Use each tissue only one time when you wipe. Drink enough fluid to keep your urine pale yellow. Keep all follow-up visits. This is important. Contact a health   care provider if: Your symptoms do not get better after 1-2 days. Your symptoms go away and then return. Get help right away if: You have severe pain in  your back or your lower abdomen. You have a fever or chills. You have nausea or vomiting. Summary A urinary tract infection (UTI) is an infection of any part of the urinary tract, which includes the kidneys, ureters, bladder, and urethra. Most urinary tract infections are caused by bacteria in your genital area. Treatment for this condition often includes antibiotic medicines. If you were prescribed an antibiotic medicine, take it as told by your health care provider. Do not stop using the antibiotic even if you start to feel better. Keep all follow-up visits. This is important. This information is not intended to replace advice given to you by your health care provider. Make sure you discuss any questions you have with your health care provider. Document Revised: 12/30/2019 Document Reviewed: 12/30/2019 Elsevier Patient Education  2023 Elsevier Inc.  

## 2021-10-14 DIAGNOSIS — F4011 Social phobia, generalized: Secondary | ICD-10-CM | POA: Diagnosis not present

## 2021-10-14 DIAGNOSIS — F3131 Bipolar disorder, current episode depressed, mild: Secondary | ICD-10-CM | POA: Diagnosis not present

## 2021-10-15 ENCOUNTER — Telehealth: Payer: Self-pay

## 2021-10-15 NOTE — Telephone Encounter (Signed)
[place] with [facility] left a voice meesage: ? ?Needing to talk with a nurse to verify if a mediation can be prescribed for patient kidney issues. ? ?Didn't leave a call back number. ?She said she did leave a voice message for [facility]. ? ?Please advise. ? ?Thanks, ?Helene Kelp ?

## 2021-10-17 NOTE — Telephone Encounter (Signed)
Number left of voicemail [phone] called no answer.

## 2021-10-24 ENCOUNTER — Inpatient Hospital Stay (HOSPITAL_COMMUNITY): Admission: RE | Admit: 2021-10-24 | Payer: Medicare HMO | Source: Ambulatory Visit

## 2021-10-24 ENCOUNTER — Other Ambulatory Visit: Payer: Self-pay | Admitting: Internal Medicine

## 2021-10-24 ENCOUNTER — Encounter (HOSPITAL_COMMUNITY): Payer: Self-pay

## 2021-10-30 ENCOUNTER — Telehealth: Payer: Self-pay | Admitting: Urology

## 2021-10-30 DIAGNOSIS — R809 Proteinuria, unspecified: Secondary | ICD-10-CM | POA: Diagnosis not present

## 2021-10-30 DIAGNOSIS — I129 Hypertensive chronic kidney disease with stage 1 through stage 4 chronic kidney disease, or unspecified chronic kidney disease: Secondary | ICD-10-CM | POA: Diagnosis not present

## 2021-10-30 DIAGNOSIS — N1832 Chronic kidney disease, stage 3b: Secondary | ICD-10-CM | POA: Diagnosis not present

## 2021-10-30 DIAGNOSIS — N170 Acute kidney failure with tubular necrosis: Secondary | ICD-10-CM | POA: Diagnosis not present

## 2021-10-30 NOTE — Telephone Encounter (Signed)
Patient called the office today and left a voice mail message with questions about her prescriptions.  She indicated that Dr. Alyson Ingles wants her to take something all the time for UTIs, but is confused about the name.  I attempted to contact the patient to resolve her questions, but unable to reach her or leave a message.

## 2021-10-31 ENCOUNTER — Encounter: Payer: Self-pay | Admitting: Urology

## 2021-11-02 NOTE — Telephone Encounter (Signed)
Spoke with patient through mychart

## 2021-11-04 ENCOUNTER — Telehealth: Payer: Self-pay

## 2021-11-04 NOTE — Telephone Encounter (Signed)
Patient states that pharmacy did not have her rx.  The medication was sent with 11 refills, sent patient a mychart message informing her.  Unable to get in touch with her by phone.

## 2021-11-09 DIAGNOSIS — E876 Hypokalemia: Secondary | ICD-10-CM | POA: Diagnosis not present

## 2021-11-09 DIAGNOSIS — N1832 Chronic kidney disease, stage 3b: Secondary | ICD-10-CM | POA: Diagnosis not present

## 2021-11-09 DIAGNOSIS — R809 Proteinuria, unspecified: Secondary | ICD-10-CM | POA: Diagnosis not present

## 2021-11-09 DIAGNOSIS — I129 Hypertensive chronic kidney disease with stage 1 through stage 4 chronic kidney disease, or unspecified chronic kidney disease: Secondary | ICD-10-CM | POA: Diagnosis not present

## 2021-11-19 ENCOUNTER — Other Ambulatory Visit: Payer: Medicare HMO | Admitting: Obstetrics & Gynecology

## 2021-11-26 ENCOUNTER — Other Ambulatory Visit: Payer: Medicare HMO | Admitting: Obstetrics & Gynecology

## 2021-12-10 ENCOUNTER — Encounter: Payer: Self-pay | Admitting: Obstetrics & Gynecology

## 2021-12-10 ENCOUNTER — Ambulatory Visit: Payer: Medicare HMO | Admitting: Obstetrics & Gynecology

## 2021-12-10 ENCOUNTER — Other Ambulatory Visit (HOSPITAL_COMMUNITY)
Admission: RE | Admit: 2021-12-10 | Discharge: 2021-12-10 | Disposition: A | Discharge status: Home / Self Care | Payer: Medicare HMO | Source: Ambulatory Visit | Attending: Obstetrics & Gynecology | Admitting: Obstetrics & Gynecology

## 2021-12-10 VITALS — BP 122/78 | HR 87 | Ht 62.0 in | Wt 223.0 lb

## 2021-12-10 DIAGNOSIS — Z124 Encounter for screening for malignant neoplasm of cervix: Secondary | ICD-10-CM

## 2021-12-10 DIAGNOSIS — R87613 High grade squamous intraepithelial lesion on cytologic smear of cervix (HGSIL): Secondary | ICD-10-CM | POA: Diagnosis not present

## 2021-12-10 NOTE — Progress Notes (Signed)
Follow up appointment for results: S/P laser ablation of the cervix for HSIL  Chief Complaint  Patient presents with   Follow-up    Pap only    Blood pressure 122/78, pulse 87, height '5\' 2"'$  (1.575 m), weight 223 lb (101.2 kg).  2nd follow up Pap was normal  No complaints  MEDS ordered this encounter: No orders of the defined types were placed in this encounter.   Orders for this encounter: No orders of the defined types were placed in this encounter.   Impression + Management Plan   ICD-10-CM   1. High grade squamous intraepithelial cervical dysplasia, history of s/p laser ablation  R87.613     2. Routine cervical smear  Z12.4 Cytology - PAP( [facility])      Follow Up: Return in about 6 months (around 06/12/2022) for Follow up cytology post laser.     All questions were answered.  Past Medical History:  Diagnosis Date   Anxiety    Chronic kidney disease    CKD (chronic kidney disease) stage 3, GFR 30-59 ml/min (HCC) 03/15/2019   Depression    Diarrhea 05/04/2017   Essential hypertension, benign 03/15/2019   Hypertension    Hypothyroidism    Hypothyroidism, adult 03/15/2019   Kidney stones    LGSIL on Pap smear of cervix 08/17/2020   Needs colpo per ASCCP immediate risk is 4.3 for CIN 3+risk    Obesity (BMI 30-39.9) 03/15/2019   Renal disorder    Vitamin D deficiency disease 03/15/2019    Past Surgical History:  Procedure Laterality Date   BIOPSY  06/05/2017   Procedure: BIOPSY;  Surgeon: Rogene Houston, MD;  [facility];  Service: Endoscopy;;  colon   CHOLECYSTECTOMY     COLONOSCOPY N/A 06/05/2017   Procedure: COLONOSCOPY;  Surgeon: Rogene Houston, MD;  [facility]: [facility];  Service: Endoscopy;  Laterality: N/A;  8:30   CYSTOSCOPY WITH RETROGRADE PYELOGRAM, URETEROSCOPY AND STENT PLACEMENT Left 10/04/2020   Procedure: CYSTOSCOPY WITH LEFT RETROGRADE PYELOGRAM, DIAGNOSTIC LEFT URETEROSCOPY AND LEFT URETEREAL STENT PLACEMENT;  Surgeon:  Cleon Gustin, MD;  Location: [facility];  Service: Urology;  Laterality: Left;   CYSTOSCOPY WITH RETROGRADE PYELOGRAM, URETEROSCOPY AND STENT PLACEMENT Left 10/16/2020   Procedure: CYSTOSCOPY WITH LEFT RETROGRADE PYELOGRAM, LEFT URETEROSCOPY  WITH LASER AND LEFT URETERAL STENT EXCHANGE;  Surgeon: Cleon Gustin, MD;  Location: [facility];  Service: Urology;  Laterality: Left;   HOLMIUM LASER APPLICATION Left 10/17/15   Procedure: HOLMIUM LASER APPLICATION;  Surgeon: Cleon Gustin, MD;  Location: [facility];  Service: Urology;  Laterality: Left;   KIDNEY STONE SURGERY     kidney stones     LASER ABLATION CONDOLAMATA N/A 10/31/2020   Procedure: LASER ABLATION OF THE CERVIX;  Surgeon: Florian Buff, MD;  Location: [facility];  Service: Gynecology;  Laterality: N/A;    OB History     Gravida  1   Para      Term      Preterm      AB  1   Living  0      SAB      IAB      Ectopic      Multiple      Live Births              Allergies  Allergen Reactions   Morphine And Related Itching and Hives   Sulfa Antibiotics Swelling   Tape Itching and Other (See  Comments)    Surgical     Social History   Socioeconomic History   Marital status: Divorced    Spouse name: Not on file   Number of children: Not on file   Years of education: Not on file   Highest education level: Not on file  Occupational History   Not on file  Tobacco Use   Smoking status: Never    Passive exposure: Yes   Smokeless tobacco: Never  Vaping Use   Vaping Use: Never used  Substance and Sexual Activity   Alcohol use: No   Drug use: No   Sexual activity: Not Currently    Birth control/protection: Injection  Other Topics Concern   Not on file  Social History Narrative   Divorced twice,1st lasted 5 years,2nd 4 years.On disabilty secondary to mental illness since 2004.Previously used to [employer]Lives alone,has 2 cats.   Social Determinants of Health   Financial Resource  Strain: Medium Risk (08/06/2020)   Overall Financial Resource Strain (CARDIA)    Difficulty of Paying Living Expenses: Somewhat hard  Food Insecurity: No Food Insecurity (08/06/2020)   Hunger Vital Sign    Worried About Running Out of Food in the Last Year: Never true    Ran Out of Food in the Last Year: Never true  Transportation Needs: No Transportation Needs (08/06/2020)   PRAPARE - Hydrologist (Medical): No    Lack of Transportation (Non-Medical): No  Physical Activity: Insufficiently Active (08/06/2020)   Exercise Vital Sign    Days of Exercise per Week: 2 days    Minutes of Exercise per Session: 10 min  Stress: Stress Concern Present (08/06/2020)   [place]    Feeling of Stress : To some extent  Social Connections: Socially Isolated (08/06/2020)   Social Connection and Isolation Panel [NHANES]    Frequency of Communication with Friends and Family: More than three times a week    Frequency of Social Gatherings with Friends and Family: Three times a week    Attends Religious Services: Never    Active Member of Clubs or Organizations: No    Attends Archivist Meetings: Never    Marital Status: Divorced    Family History  Problem Relation Age of Onset   Cancer Paternal Grandmother    Heart failure Paternal Grandmother    Cancer Maternal Grandmother    Cancer Father    Heart failure Father    Lung cancer Mother    Colon cancer Neg Hx

## 2021-12-16 LAB — CYTOLOGY - PAP
Adequacy: ABSENT
Diagnosis: NEGATIVE

## 2021-12-19 DIAGNOSIS — N202 Calculus of kidney with calculus of ureter: Secondary | ICD-10-CM | POA: Diagnosis not present

## 2021-12-19 DIAGNOSIS — F32A Depression, unspecified: Secondary | ICD-10-CM | POA: Diagnosis not present

## 2021-12-19 DIAGNOSIS — Z882 Allergy status to sulfonamides status: Secondary | ICD-10-CM | POA: Diagnosis not present

## 2021-12-19 DIAGNOSIS — R5381 Other malaise: Secondary | ICD-10-CM | POA: Diagnosis not present

## 2021-12-19 DIAGNOSIS — R319 Hematuria, unspecified: Secondary | ICD-10-CM | POA: Diagnosis not present

## 2021-12-19 DIAGNOSIS — F17210 Nicotine dependence, cigarettes, uncomplicated: Secondary | ICD-10-CM | POA: Diagnosis not present

## 2021-12-19 DIAGNOSIS — R0689 Other abnormalities of breathing: Secondary | ICD-10-CM | POA: Diagnosis not present

## 2021-12-19 DIAGNOSIS — K589 Irritable bowel syndrome without diarrhea: Secondary | ICD-10-CM | POA: Diagnosis not present

## 2021-12-19 DIAGNOSIS — N183 Chronic kidney disease, stage 3 unspecified: Secondary | ICD-10-CM | POA: Diagnosis not present

## 2021-12-19 DIAGNOSIS — Z885 Allergy status to narcotic agent status: Secondary | ICD-10-CM | POA: Diagnosis not present

## 2021-12-19 DIAGNOSIS — R000 Tachycardia, unspecified: Secondary | ICD-10-CM | POA: Diagnosis not present

## 2021-12-19 DIAGNOSIS — I10 Essential (primary) hypertension: Secondary | ICD-10-CM | POA: Diagnosis not present

## 2021-12-19 DIAGNOSIS — Z91048 Other nonmedicinal substance allergy status: Secondary | ICD-10-CM | POA: Diagnosis not present

## 2021-12-19 DIAGNOSIS — I129 Hypertensive chronic kidney disease with stage 1 through stage 4 chronic kidney disease, or unspecified chronic kidney disease: Secondary | ICD-10-CM | POA: Diagnosis not present

## 2021-12-19 DIAGNOSIS — N23 Unspecified renal colic: Secondary | ICD-10-CM | POA: Diagnosis not present

## 2021-12-27 ENCOUNTER — Ambulatory Visit: Payer: Medicare HMO | Admitting: Urology

## 2021-12-30 ENCOUNTER — Ambulatory Visit (INDEPENDENT_AMBULATORY_CARE_PROVIDER_SITE_OTHER): Payer: Medicare HMO | Admitting: Internal Medicine

## 2021-12-30 ENCOUNTER — Encounter: Payer: Self-pay | Admitting: Internal Medicine

## 2021-12-30 VITALS — BP 128/74 | HR 98 | Ht 63.0 in | Wt 224.4 lb

## 2021-12-30 DIAGNOSIS — N1832 Chronic kidney disease, stage 3b: Secondary | ICD-10-CM

## 2021-12-30 DIAGNOSIS — R55 Syncope and collapse: Secondary | ICD-10-CM | POA: Diagnosis not present

## 2021-12-30 DIAGNOSIS — I10 Essential (primary) hypertension: Secondary | ICD-10-CM | POA: Diagnosis not present

## 2021-12-30 HISTORY — DX: Syncope and collapse: R55

## 2021-12-30 MED ORDER — CLONIDINE HCL 0.2 MG PO TABS: 0.2000 mg | ORAL_TABLET | Freq: Every evening | ORAL | 0 refills | Status: DC

## 2021-12-30 NOTE — Assessment & Plan Note (Signed)
Likely due to uncontrolled HTN in the past Followed by Nephrology On Losartan and Chlorthalidone Avoid nephrotoxic agents

## 2021-12-30 NOTE — Progress Notes (Signed)
Established Patient Office Visit  Subjective:  Patient ID: [name], female    DOB: 1970/08/08  Age: 51 y.o. MRN: [id number]  CC:  Chief Complaint  Patient presents with   Medication Problem    Has been falling a lot more and not sure if its because of meds or not. [place] the other day and speech started slurring    HPI [name] is a 51 y.o. female with past medical history of HTN, CKD stage III, bipolar disorder and nephrolithiasis who presents for f/u of her chronic medical conditions.  Syncope: She complains of episodes of dizziness for the last 1 week.  She had 4 episodes of falls or near falls without major injuries.  Of note, she was recently placed on losartan and chlorthalidone for her CKD and HTN.  She admits poor p.o. intake of fluids.  She appeared confused after a fall according to her sister.  She denied any shaking movements, tongue bite or focal numbness or tingling.  HTN: BP is well-controlled. Takes medications regularly. Patient denies chest pain, dyspnea or palpitations.  CKD: Followed by Nephrology.  Denies any dysuria, hematuria or urinary hesitancy or resistance.  Past Medical History:  Diagnosis Date   Anxiety    Chronic kidney disease    CKD (chronic kidney disease) stage 3, GFR 30-59 ml/min (HCC) 03/15/2019   Depression    Diarrhea 05/04/2017   Essential hypertension, benign 03/15/2019   Hypertension    Hypothyroidism    Hypothyroidism, adult 03/15/2019   Kidney stones    LGSIL on Pap smear of cervix 08/17/2020   Needs colpo per ASCCP immediate risk is 4.3 for CIN 3+risk    Obesity (BMI 30-39.9) 03/15/2019   Renal disorder    Vitamin D deficiency disease 03/15/2019    Past Surgical History:  Procedure Laterality Date   BIOPSY  06/05/2017   Procedure: BIOPSY;  Surgeon: Rogene Houston, MD;  [facility];  Service: Endoscopy;;  colon   CHOLECYSTECTOMY     COLONOSCOPY N/A 06/05/2017   Procedure: COLONOSCOPY;  Surgeon: Rogene Houston, MD;  [facility];  Service: Endoscopy;  Laterality: N/A;  8:30   CYSTOSCOPY WITH RETROGRADE PYELOGRAM, URETEROSCOPY AND STENT PLACEMENT Left 10/04/2020   Procedure: CYSTOSCOPY WITH LEFT RETROGRADE PYELOGRAM, DIAGNOSTIC LEFT URETEROSCOPY AND LEFT URETEREAL STENT PLACEMENT;  Surgeon: Cleon Gustin, MD;  Location: [facility];  Service: Urology;  Laterality: Left;   CYSTOSCOPY WITH RETROGRADE PYELOGRAM, URETEROSCOPY AND STENT PLACEMENT Left 10/16/2020   Procedure: CYSTOSCOPY WITH LEFT RETROGRADE PYELOGRAM, LEFT URETEROSCOPY  WITH LASER AND LEFT URETERAL STENT EXCHANGE;  Surgeon: Cleon Gustin, MD;  Location: [facility];  Service: Urology;  Laterality: Left;   HOLMIUM LASER APPLICATION Left 11/26/348   Procedure: HOLMIUM LASER APPLICATION;  Surgeon: Cleon Gustin, MD;  Location: [facility];  Service: Urology;  Laterality: Left;   KIDNEY STONE SURGERY     kidney stones     LASER ABLATION CONDOLAMATA N/A 10/31/2020   Procedure: LASER ABLATION OF THE CERVIX;  Surgeon: Florian Buff, MD;  Location: [facility];  Service: Gynecology;  Laterality: N/A;    Family History  Problem Relation Age of Onset   Cancer Paternal Grandmother    Heart failure Paternal Grandmother    Cancer Maternal Grandmother    Cancer Father    Heart failure Father    Lung cancer Mother    Colon cancer Neg Hx     Social History   Socioeconomic  History   Marital status: Divorced    Spouse name: Not on file   Number of children: Not on file   Years of education: Not on file   Highest education level: Not on file  Occupational History   Not on file  Tobacco Use   Smoking status: Never    Passive exposure: Yes   Smokeless tobacco: Never  Vaping Use   Vaping Use: Never used  Substance and Sexual Activity   Alcohol use: No   Drug use: No   Sexual activity: Not Currently    Birth control/protection: Injection  Other Topics Concern   Not on file  Social History Narrative   Divorced twice,1st lasted 5  years,2nd 4 years.On disabilty secondary to mental illness since 2004.Previously used to [employer].Lives alone,has 2 cats.   Social Determinants of Health   Financial Resource Strain: Medium Risk (08/06/2020)   Overall Financial Resource Strain (CARDIA)    Difficulty of Paying Living Expenses: Somewhat hard  Food Insecurity: No Food Insecurity (08/06/2020)   Hunger Vital Sign    Worried About Running Out of Food in the Last Year: Never true    Ran Out of Food in the Last Year: Never true  Transportation Needs: No Transportation Needs (08/06/2020)   PRAPARE - Hydrologist (Medical): No    Lack of Transportation (Non-Medical): No  Physical Activity: Insufficiently Active (08/06/2020)   Exercise Vital Sign    Days of Exercise per Week: 2 days    Minutes of Exercise per Session: 10 min  Stress: Stress Concern Present (08/06/2020)   [place]    Feeling of Stress : To some extent  Social Connections: Socially Isolated (08/06/2020)   Social Connection and Isolation Panel [NHANES]    Frequency of Communication with Friends and Family: More than three times a week    Frequency of Social Gatherings with Friends and Family: Three times a week    Attends Religious Services: Never    Active Member of Clubs or Organizations: No    Attends Archivist Meetings: Never    Marital Status: Divorced  Human resources officer Violence: Not At Risk (08/06/2020)   Humiliation, Afraid, Rape, and Kick questionnaire    Fear of Current or Ex-Partner: No    Emotionally Abused: No    Physically Abused: No    Sexually Abused: No    Outpatient Medications Prior to Visit  Medication Sig Dispense Refill   ALPRAZolam (XANAX) 1 MG tablet Take 1 mg by mouth 3 (three) times daily as needed for anxiety.      amantadine (SYMMETREL) 100 MG capsule Take 100 mg by mouth at bedtime.     amLODipine (NORVASC) 5 MG tablet  Take 1 tablet (5 mg total) by mouth in the morning and at bedtime. 60 tablet 2   ARIPiprazole (ABILIFY) 5 MG tablet Take 5 mg by mouth at bedtime.     carvedilol (COREG) 25 MG tablet Take 25 mg by mouth 2 (two) times daily.     chlorthalidone (HYGROTON) 25 MG tablet Take by mouth.     Cholecalciferol (VITAMIN D3) 125 MCG (5000 UT) TABS Take 10,000 Units by mouth daily.     doxepin (SINEQUAN) 75 MG capsule Take 225 mg by mouth at bedtime.      lamoTRIgine (LAMICTAL) 200 MG tablet Take 200 mg by mouth 2 (two) times daily.     losartan (COZAAR) 25 MG tablet Take  25 mg by mouth in the morning.     medroxyPROGESTERone (DEPO-PROVERA) 150 MG/ML injection Inject 1 mL (150 mg total) into the muscle every 3 (three) months. 1 mL 3   nitrofurantoin (MACRODANTIN) 50 MG capsule Take 1 capsule (50 mg total) by mouth at bedtime. 30 capsule 11   potassium citrate (UROCIT-K) 5 MEQ (540 MG) SR tablet Take 10 mEq by mouth 3 (three) times daily.     QUEtiapine (SEROQUEL XR) 400 MG 24 hr tablet Take 400 mg by mouth at bedtime.      rosuvastatin (CRESTOR) 10 MG tablet Take 1 tablet (10 mg total) by mouth daily. 90 tablet 1   cloNIDine (CATAPRES) 0.3 MG tablet Take 1 tablet (0.3 mg total) by mouth 2 (two) times daily. 60 tablet 2   nitrofurantoin, macrocrystal-monohydrate, (MACROBID) 100 MG capsule Take 1 capsule (100 mg total) by mouth every 12 (twelve) hours. 14 capsule 0   No facility-administered medications prior to visit.    Allergies  Allergen Reactions   Morphine And Related Itching and Hives   Sulfa Antibiotics Swelling   Tape Itching and Other (See Comments)    Surgical     ROS Review of Systems  Constitutional:  Negative for chills and fever.  HENT:  Negative for congestion, sinus pressure, sinus pain and sore throat.   Eyes:  Negative for pain and discharge.  Respiratory:  Negative for cough and shortness of breath.   Cardiovascular:  Negative for chest pain and palpitations.  Gastrointestinal:   Negative for abdominal pain, constipation, diarrhea, nausea and vomiting.  Endocrine: Negative for polydipsia and polyuria.  Genitourinary:  Negative for dysuria and hematuria.  Musculoskeletal:  Negative for neck pain and neck stiffness.  Skin:  Negative for rash.  Neurological:  Positive for dizziness. Negative for weakness.  Psychiatric/Behavioral:  Negative for agitation and behavioral problems.       Objective:    Physical Exam Vitals reviewed.  Constitutional:      General: She is not in acute distress.    Appearance: She is not diaphoretic.  HENT:     Head: Normocephalic and atraumatic.     Nose: Nose normal.     Mouth/Throat:     Mouth: Mucous membranes are moist.  Eyes:     General: No scleral icterus.    Extraocular Movements: Extraocular movements intact.  Cardiovascular:     Rate and Rhythm: Normal rate and regular rhythm.     Pulses: Normal pulses.     Heart sounds: Normal heart sounds. No murmur heard. Pulmonary:     Breath sounds: Normal breath sounds. No wheezing or rales.  Musculoskeletal:     Cervical back: Neck supple. No tenderness.     Right lower leg: No edema.     Left lower leg: No edema.  Skin:    General: Skin is warm.     Findings: No rash.  Neurological:     General: No focal deficit present.     Mental Status: She is alert and oriented to person, place, and time.     Sensory: No sensory deficit.     Motor: No weakness.  Psychiatric:        Mood and Affect: Mood normal.        Behavior: Behavior normal.     BP 128/74   Pulse 98   Ht '5\' 3"'$  (1.6 m)   Wt 224 lb 6.4 oz (101.8 kg)   SpO2 95%   BMI 39.75 kg/m  Wt Readings  from Last 3 Encounters:  12/30/21 224 lb 6.4 oz (101.8 kg)  12/10/21 223 lb (101.2 kg)  09/12/21 220 lb (99.8 kg)    Lab Results  Component Value Date   TSH 4.61 (H) 07/04/2020   Lab Results  Component Value Date   WBC 7.2 10/30/2020   HGB 12.6 10/30/2020   HCT 39.8 10/30/2020   MCV 89.4 10/30/2020   PLT  410 (H) 10/30/2020   Lab Results  Component Value Date   NA 138 08/31/2021   K 3.4 (L) 08/31/2021   CO2 24 08/31/2021   GLUCOSE 99 08/31/2021   BUN 23 (H) 08/31/2021   CREATININE 1.52 (H) 08/31/2021   BILITOT 0.3 10/30/2020   ALKPHOS 169 (H) 10/30/2020   AST 18 10/30/2020   ALT 28 10/30/2020   PROT 7.5 10/30/2020   ALBUMIN 3.9 08/31/2021   CALCIUM 9.1 08/31/2021   ANIONGAP 11 08/31/2021   Lab Results  Component Value Date   CHOL 244 (H) 09/04/2021   Lab Results  Component Value Date   HDL 44 (L) 09/04/2021   Lab Results  Component Value Date   LDLCALC 161 (H) 09/04/2021   Lab Results  Component Value Date   TRIG 218 (H) 09/04/2021   Lab Results  Component Value Date   CHOLHDL 5.5 (H) 09/04/2021   Lab Results  Component Value Date   HGBA1C 5.2 09/04/2021      Assessment & Plan:   Problem List Items Addressed This Visit       Cardiovascular and Mediastinum   Essential hypertension, benign - Primary    BP Readings from Last 1 Encounters:  12/30/21 128/74  Well-controlled with amlodipine, Losartan, Coreg, clonidine and chlorthalidone Followed by Nephrology Episodes of dizziness could be due to dehydration and orthostatic hypotension - decreased dose of clonidine to 0.2 mg nightly only, Needs to improve hydration Counseled for compliance with the medications Advised DASH diet and moderate exercise/walking, at least 150 mins/week      Relevant Medications   cloNIDine (CATAPRES) 0.2 MG tablet     Genitourinary   CKD (chronic kidney disease) stage 3, GFR 30-59 ml/min (HCC)    Likely due to uncontrolled HTN in the past Followed by Nephrology On Losartan and Chlorthalidone Avoid nephrotoxic agents        Other   Vasovagal syncope    Her episodes of fall or near fall likely vasovagal syncope in the setting of dehydration and/or orthostatic hypotension due to diuretics and clonidine Decreased dose of clonidine to 0.2 mg nightly only Advised to  maintain at least 64 ounces of fluid intake in a day  She is planned to have dental procedure - she is stable with moderate risk.       Meds ordered this encounter  Medications   cloNIDine (CATAPRES) 0.2 MG tablet    Sig: Take 1 tablet (0.2 mg total) by mouth every evening.    Dispense:  30 tablet    Refill:  0    Follow-up: Return in about 4 weeks (around 01/27/2022) for HTN.    Lindell Spar, MD

## 2021-12-30 NOTE — Patient Instructions (Signed)
Please take Clonidine 0.2 mg at bedtime instead of 0.3 mg dose.  Please continue to follow low salt diet and increase fluid intake to at least 64 ounces.

## 2021-12-30 NOTE — Assessment & Plan Note (Signed)
BP Readings from Last 1 Encounters:  12/30/21 128/74   Well-controlled with amlodipine, Losartan, Coreg, clonidine and chlorthalidone Followed by Nephrology Episodes of dizziness could be due to dehydration and orthostatic hypotension - decreased dose of clonidine to 0.2 mg nightly only, Needs to improve hydration Counseled for compliance with the medications Advised DASH diet and moderate exercise/walking, at least 150 mins/week

## 2021-12-30 NOTE — Assessment & Plan Note (Signed)
Her episodes of fall or near fall likely vasovagal syncope in the setting of dehydration and/or orthostatic hypotension due to diuretics and clonidine Decreased dose of clonidine to 0.2 mg nightly only Advised to maintain at least 64 ounces of fluid intake in a day  She is planned to have dental procedure - she is stable with moderate risk.

## 2022-01-01 DIAGNOSIS — R69 Illness, unspecified: Secondary | ICD-10-CM | POA: Diagnosis not present

## 2022-01-03 DIAGNOSIS — N1832 Chronic kidney disease, stage 3b: Secondary | ICD-10-CM | POA: Diagnosis not present

## 2022-01-03 DIAGNOSIS — R809 Proteinuria, unspecified: Secondary | ICD-10-CM | POA: Diagnosis not present

## 2022-01-03 DIAGNOSIS — E876 Hypokalemia: Secondary | ICD-10-CM | POA: Diagnosis not present

## 2022-01-03 DIAGNOSIS — I129 Hypertensive chronic kidney disease with stage 1 through stage 4 chronic kidney disease, or unspecified chronic kidney disease: Secondary | ICD-10-CM | POA: Diagnosis not present

## 2022-01-08 DIAGNOSIS — N1832 Chronic kidney disease, stage 3b: Secondary | ICD-10-CM | POA: Diagnosis not present

## 2022-01-08 DIAGNOSIS — N178 Other acute kidney failure: Secondary | ICD-10-CM | POA: Diagnosis not present

## 2022-01-08 DIAGNOSIS — R82991 Hypocitraturia: Secondary | ICD-10-CM | POA: Diagnosis not present

## 2022-01-08 DIAGNOSIS — R809 Proteinuria, unspecified: Secondary | ICD-10-CM | POA: Diagnosis not present

## 2022-01-08 DIAGNOSIS — Z6839 Body mass index (BMI) 39.0-39.9, adult: Secondary | ICD-10-CM | POA: Diagnosis not present

## 2022-01-08 DIAGNOSIS — I129 Hypertensive chronic kidney disease with stage 1 through stage 4 chronic kidney disease, or unspecified chronic kidney disease: Secondary | ICD-10-CM | POA: Diagnosis not present

## 2022-01-08 DIAGNOSIS — N200 Calculus of kidney: Secondary | ICD-10-CM | POA: Diagnosis not present

## 2022-01-08 DIAGNOSIS — N2581 Secondary hyperparathyroidism of renal origin: Secondary | ICD-10-CM | POA: Diagnosis not present

## 2022-01-08 DIAGNOSIS — E876 Hypokalemia: Secondary | ICD-10-CM | POA: Diagnosis not present

## 2022-01-14 ENCOUNTER — Other Ambulatory Visit: Payer: Medicare HMO | Admitting: Urology

## 2022-01-20 ENCOUNTER — Other Ambulatory Visit: Payer: Self-pay | Admitting: Internal Medicine

## 2022-01-20 DIAGNOSIS — I10 Essential (primary) hypertension: Secondary | ICD-10-CM

## 2022-01-27 ENCOUNTER — Ambulatory Visit (INDEPENDENT_AMBULATORY_CARE_PROVIDER_SITE_OTHER): Payer: Medicare HMO | Admitting: Internal Medicine

## 2022-01-27 ENCOUNTER — Other Ambulatory Visit: Payer: Medicare HMO | Admitting: Urology

## 2022-01-27 ENCOUNTER — Encounter: Payer: Self-pay | Admitting: Internal Medicine

## 2022-01-27 VITALS — BP 136/82 | HR 92 | Resp 18 | Ht 63.0 in | Wt 213.6 lb

## 2022-01-27 DIAGNOSIS — N1832 Chronic kidney disease, stage 3b: Secondary | ICD-10-CM

## 2022-01-27 DIAGNOSIS — I10 Essential (primary) hypertension: Secondary | ICD-10-CM

## 2022-01-27 DIAGNOSIS — E782 Mixed hyperlipidemia: Secondary | ICD-10-CM

## 2022-01-27 MED ORDER — CLONIDINE HCL 0.1 MG PO TABS: 0.1000 mg | ORAL_TABLET | Freq: Every evening | ORAL | 3 refills | Status: DC

## 2022-01-27 NOTE — Progress Notes (Signed)
Established Patient Office Visit  Subjective:  Patient ID: [name], female    DOB: June 28, 1970  Age: 51 y.o. MRN: [id number]  CC:  Chief Complaint  Patient presents with   Follow-up    4 week follow up HTN and needs to review medications not sure of what she needs to be taking     HPI [name] is a 51 y.o. female with past medical history of HTN, CKD stage III, bipolar disorder and nephrolithiasis who presents for f/u of her chronic medical conditions.  HTN: BP was well controlled today.  She has been taking decreased dose of clonidine 0.2 mg daily.  She denies any dizziness or episode of syncope since decreasing dose of clonidine.  Denies any headache, chest pain or palpitations.  CKD:  Followed by Nephrology.  Denies any dysuria, hematuria or urinary hesitancy or resistance.  She had questions about her current medications.  I have explained her antihypertensives and cholesterol medications.  She expressed understanding.     Past Medical History:  Diagnosis Date   Anxiety    Chronic kidney disease    CKD (chronic kidney disease) stage 3, GFR 30-59 ml/min (HCC) 03/15/2019   Depression    Diarrhea 05/04/2017   Essential hypertension, benign 03/15/2019   Hypertension    Hypothyroidism    Hypothyroidism, adult 03/15/2019   Kidney stones    LGSIL on Pap smear of cervix 08/17/2020   Needs colpo per [facility] immediate risk is 4.3 for CIN 3+risk    Obesity (BMI 30-39.9) 03/15/2019   Renal disorder    Vitamin D deficiency disease 03/15/2019    Past Surgical History:  Procedure Laterality Date   BIOPSY  06/05/2017   Procedure: BIOPSY;  Surgeon: Rogene Houston, MD;  Location: [facility];  Service: Endoscopy;;  colon   CHOLECYSTECTOMY     COLONOSCOPY N/A 06/05/2017   Procedure: COLONOSCOPY;  Surgeon: Rogene Houston, MD;  Location: [facility];  Service: Endoscopy;  Laterality: N/A;  8:30   CYSTOSCOPY WITH RETROGRADE PYELOGRAM, URETEROSCOPY AND STENT PLACEMENT Left  10/04/2020   Procedure: CYSTOSCOPY WITH LEFT RETROGRADE PYELOGRAM, DIAGNOSTIC LEFT URETEROSCOPY AND LEFT URETEREAL STENT PLACEMENT;  Surgeon: Cleon Gustin, MD;  Location: [facility];  Service: Urology;  Laterality: Left;   CYSTOSCOPY WITH RETROGRADE PYELOGRAM, URETEROSCOPY AND STENT PLACEMENT Left 10/16/2020   Procedure: CYSTOSCOPY WITH LEFT RETROGRADE PYELOGRAM, LEFT URETEROSCOPY  WITH LASER AND LEFT URETERAL STENT EXCHANGE;  Surgeon: Cleon Gustin, MD;  Location: [facility];  Service: Urology;  Laterality: Left;   HOLMIUM LASER APPLICATION Left 0/17/5102   Procedure: HOLMIUM LASER APPLICATION;  Surgeon: Cleon Gustin, MD;  Location: [facility];  Service: Urology;  Laterality: Left;   KIDNEY STONE SURGERY     kidney stones     LASER ABLATION CONDOLAMATA N/A 10/31/2020   Procedure: LASER ABLATION OF THE CERVIX;  Surgeon: Florian Buff, MD;  Location: [facility];  Service: Gynecology;  Laterality: N/A;    Family History  Problem Relation Age of Onset   Cancer Paternal Grandmother    Heart failure Paternal Grandmother    Cancer Maternal Grandmother    Cancer Father    Heart failure Father    Lung cancer Mother    Colon cancer Neg Hx     Social History   Socioeconomic History   Marital status: Divorced    Spouse name: Not on file   Number of children: Not on file   Years of education: Not  on file   Highest education level: Not on file  Occupational History   Not on file  Tobacco Use   Smoking status: Never    Passive exposure: Yes   Smokeless tobacco: Never  Vaping Use   Vaping Use: Never used  Substance and Sexual Activity   Alcohol use: No   Drug use: No   Sexual activity: Not Currently    Birth control/protection: Injection  Other Topics Concern   Not on file  Social History Narrative   Divorced twice,1st lasted 5 years,2nd 4 years.On disabilty secondary to mental illness since 2004.Previously used to [employer]Lives alone,has 2 cats.   Social Determinants  of Health   Financial Resource Strain: Medium Risk (08/06/2020)   Overall Financial Resource Strain (CARDIA)    Difficulty of Paying Living Expenses: Somewhat hard  Food Insecurity: No Food Insecurity (08/06/2020)   Hunger Vital Sign    Worried About Running Out of Food in the Last Year: Never true    Ran Out of Food in the Last Year: Never true  Transportation Needs: No Transportation Needs (08/06/2020)   PRAPARE - Hydrologist (Medical): No    Lack of Transportation (Non-Medical): No  Physical Activity: Insufficiently Active (08/06/2020)   Exercise Vital Sign    Days of Exercise per Week: 2 days    Minutes of Exercise per Session: 10 min  Stress: Stress Concern Present (08/06/2020)   [place]    Feeling of Stress : To some extent  Social Connections: Socially Isolated (08/06/2020)   Social Connection and Isolation Panel [NHANES]    Frequency of Communication with Friends and Family: More than three times a week    Frequency of Social Gatherings with Friends and Family: Three times a week    Attends Religious Services: Never    Active Member of Clubs or Organizations: No    Attends Archivist Meetings: Never    Marital Status: Divorced  Human resources officer Violence: Not At Risk (08/06/2020)   Humiliation, Afraid, Rape, and Kick questionnaire    Fear of Current or Ex-Partner: No    Emotionally Abused: No    Physically Abused: No    Sexually Abused: No    Outpatient Medications Prior to Visit  Medication Sig Dispense Refill   ALPRAZolam (XANAX) 1 MG tablet Take 1 mg by mouth 3 (three) times daily as needed for anxiety.      amantadine (SYMMETREL) 100 MG capsule Take 100 mg by mouth at bedtime.     amLODipine (NORVASC) 5 MG tablet Take 1 tablet (5 mg total) by mouth in the morning and at bedtime. 60 tablet 2   ARIPiprazole (ABILIFY) 5 MG tablet Take 5 mg by mouth at bedtime.     carvedilol  (COREG) 25 MG tablet Take 25 mg by mouth 2 (two) times daily.     chlorthalidone (HYGROTON) 25 MG tablet Take by mouth.     Cholecalciferol (VITAMIN D3) 125 MCG (5000 UT) TABS Take 10,000 Units by mouth daily.     doxepin (SINEQUAN) 75 MG capsule Take 225 mg by mouth at bedtime.      lamoTRIgine (LAMICTAL) 200 MG tablet Take 200 mg by mouth 2 (two) times daily.     losartan (COZAAR) 25 MG tablet Take 25 mg by mouth in the morning.     medroxyPROGESTERone (DEPO-PROVERA) 150 MG/ML injection Inject 1 mL (150 mg total) into the muscle every 3 (three)  months. 1 mL 3   nitrofurantoin (MACRODANTIN) 50 MG capsule Take 1 capsule (50 mg total) by mouth at bedtime. 30 capsule 11   potassium citrate (UROCIT-K) 5 MEQ (540 MG) SR tablet Take 10 mEq by mouth 3 (three) times daily.     QUEtiapine (SEROQUEL XR) 400 MG 24 hr tablet Take 400 mg by mouth at bedtime.      rosuvastatin (CRESTOR) 10 MG tablet Take 1 tablet (10 mg total) by mouth daily. 90 tablet 1   cloNIDine (CATAPRES) 0.2 MG tablet TAKE 1 TABLET BY MOUTH EVERY EVENING 30 tablet 0   No facility-administered medications prior to visit.    Allergies  Allergen Reactions   Morphine And Related Itching and Hives   Sulfa Antibiotics Swelling   Tape Itching and Other (See Comments)    Surgical     ROS Review of Systems  Constitutional:  Negative for chills and fever.  HENT:  Negative for congestion, sinus pressure, sinus pain and sore throat.   Eyes:  Negative for pain and discharge.  Respiratory:  Negative for cough and shortness of breath.   Cardiovascular:  Negative for chest pain and palpitations.  Gastrointestinal:  Negative for abdominal pain, constipation, diarrhea, nausea and vomiting.  Endocrine: Negative for polydipsia and polyuria.  Genitourinary:  Negative for dysuria and hematuria.  Musculoskeletal:  Negative for neck pain and neck stiffness.  Skin:  Negative for rash.  Neurological:  Positive for dizziness. Negative for  weakness.  Psychiatric/Behavioral:  Negative for agitation and behavioral problems.       Objective:    Physical Exam Vitals reviewed.  Constitutional:      General: She is not in acute distress.    Appearance: She is not diaphoretic.  HENT:     Head: Normocephalic and atraumatic.     Nose: Nose normal.     Mouth/Throat:     Mouth: Mucous membranes are moist.  Eyes:     General: No scleral icterus.    Extraocular Movements: Extraocular movements intact.  Cardiovascular:     Rate and Rhythm: Normal rate and regular rhythm.     Pulses: Normal pulses.     Heart sounds: Normal heart sounds. No murmur heard. Pulmonary:     Breath sounds: Normal breath sounds. No wheezing or rales.  Musculoskeletal:     Cervical back: Neck supple. No tenderness.     Right lower leg: No edema.     Left lower leg: No edema.  Skin:    General: Skin is warm.     Findings: No rash.  Neurological:     General: No focal deficit present.     Mental Status: She is alert and oriented to person, place, and time.     Sensory: No sensory deficit.     Motor: No weakness.  Psychiatric:        Mood and Affect: Mood normal.        Behavior: Behavior normal.     BP 136/82 (BP Location: Left Arm, Patient Position: Sitting, Cuff Size: Normal)   Pulse 92   Resp 18   Ht '5\' 3"'$  (1.6 m)   Wt 213 lb 9.6 oz (96.9 kg)   SpO2 97%   BMI 37.84 kg/m  Wt Readings from Last 3 Encounters:  01/27/22 213 lb 9.6 oz (96.9 kg)  12/30/21 224 lb 6.4 oz (101.8 kg)  12/10/21 223 lb (101.2 kg)    Lab Results  Component Value Date   TSH 4.61 (H) 07/04/2020  Lab Results  Component Value Date   WBC 7.2 10/30/2020   HGB 12.6 10/30/2020   HCT 39.8 10/30/2020   MCV 89.4 10/30/2020   PLT 410 (H) 10/30/2020   Lab Results  Component Value Date   NA 138 08/31/2021   K 3.4 (L) 08/31/2021   CO2 24 08/31/2021   GLUCOSE 99 08/31/2021   BUN 23 (H) 08/31/2021   CREATININE 1.52 (H) 08/31/2021   BILITOT 0.3 10/30/2020    ALKPHOS 169 (H) 10/30/2020   AST 18 10/30/2020   ALT 28 10/30/2020   PROT 7.5 10/30/2020   ALBUMIN 3.9 08/31/2021   CALCIUM 9.1 08/31/2021   ANIONGAP 11 08/31/2021   Lab Results  Component Value Date   CHOL 244 (H) 09/04/2021   Lab Results  Component Value Date   HDL 44 (L) 09/04/2021   Lab Results  Component Value Date   LDLCALC 161 (H) 09/04/2021   Lab Results  Component Value Date   TRIG 218 (H) 09/04/2021   Lab Results  Component Value Date   CHOLHDL 5.5 (H) 09/04/2021   Lab Results  Component Value Date   HGBA1C 5.2 09/04/2021      Assessment & Plan:   Problem List Items Addressed This Visit       Cardiovascular and Mediastinum   Essential hypertension, benign    BP Readings from Last 1 Encounters:  01/27/22 136/82  Well-controlled with amlodipine, Losartan, Coreg, clonidine and chlorthalidone Followed by Nephrology Episodes of dizziness could be due to dehydration and orthostatic hypotension - decreased dose of clonidine to 0.2 mg nightly only, dizziness resolved - further decreased dose of Clonidine to 0.1 mg QD Needs to improve hydration Counseled for compliance with the medications Advised DASH diet and moderate exercise/walking, at least 150 mins/week      Relevant Medications   cloNIDine (CATAPRES) 0.1 MG tablet     Genitourinary   CKD (chronic kidney disease) stage 3, GFR 30-59 ml/min (HCC) - Primary    Likely due to uncontrolled HTN in the past Followed by Nephrology - last visit note reviewed On Losartan and Chlorthalidone Avoid nephrotoxic agents        Other   Mixed hyperlipidemia    On Crestor      Relevant Medications   cloNIDine (CATAPRES) 0.1 MG tablet    Meds ordered this encounter  Medications   cloNIDine (CATAPRES) 0.1 MG tablet    Sig: Take 1 tablet (0.1 mg total) by mouth every evening.    Dispense:  30 tablet    Refill:  3    Dose change    Follow-up: Return in about 3 months (around 04/29/2022) for HTN.     Lindell Spar, MD

## 2022-01-27 NOTE — Assessment & Plan Note (Addendum)
Likely due to uncontrolled HTN in the past Followed by Nephrology - last visit note reviewed On Losartan and Chlorthalidone Avoid nephrotoxic agents 

## 2022-01-27 NOTE — Assessment & Plan Note (Signed)
BP Readings from Last 1 Encounters:  01/27/22 136/82   Well-controlled with amlodipine, Losartan, Coreg, clonidine and chlorthalidone Followed by Nephrology Episodes of dizziness could be due to dehydration and orthostatic hypotension - decreased dose of clonidine to 0.2 mg nightly only, dizziness resolved - further decreased dose of Clonidine to 0.1 mg QD Needs to improve hydration Counseled for compliance with the medications Advised DASH diet and moderate exercise/walking, at least 150 mins/week

## 2022-01-27 NOTE — Patient Instructions (Signed)
Please start taking Clonidine 0.1 mg instead of 0.2 mg.  Please continue taking other medications as prescribed.  Please continue to follow low salt diet and perform moderate exercise/walking at least 150 mins/week.

## 2022-01-27 NOTE — Assessment & Plan Note (Signed)
On Crestor 

## 2022-02-03 ENCOUNTER — Other Ambulatory Visit: Payer: Self-pay | Admitting: Internal Medicine

## 2022-02-03 DIAGNOSIS — E782 Mixed hyperlipidemia: Secondary | ICD-10-CM

## 2022-02-05 ENCOUNTER — Ambulatory Visit: Payer: Medicare HMO | Admitting: Urology

## 2022-02-05 VITALS — BP 126/83 | HR 92

## 2022-02-05 DIAGNOSIS — N3289 Other specified disorders of bladder: Secondary | ICD-10-CM | POA: Diagnosis not present

## 2022-02-05 DIAGNOSIS — R896 Abnormal cytological findings in specimens from other organs, systems and tissues: Secondary | ICD-10-CM

## 2022-02-05 LAB — MICROSCOPIC EXAMINATION
Bacteria, UA: NONE SEEN
Epithelial Cells (non renal): >10 /hpf — AB (ref 0–10)
RBC, Urine: NONE SEEN /hpf (ref 0–2)
Renal Epithel, UA: NONE SEEN /hpf

## 2022-02-05 LAB — URINALYSIS, ROUTINE W REFLEX MICROSCOPIC
Bilirubin, UA: NEGATIVE
Glucose, UA: NEGATIVE
Ketones, UA: NEGATIVE
Nitrite, UA: NEGATIVE
RBC, UA: NEGATIVE
Specific Gravity, UA: 1.02 (ref 1.005–1.030)
Urobilinogen, Ur: 0.2 mg/dL (ref 0.2–1.0)
pH, UA: 6.5 (ref 5.0–7.5)

## 2022-02-05 MED ORDER — CIPROFLOXACIN HCL 500 MG PO TABS
500.0000 mg | ORAL_TABLET | Freq: Once | ORAL | Status: AC
  Administered 2022-02-05: 500 mg via ORAL

## 2022-02-05 NOTE — H&P (View-Only) (Signed)
   02/05/22  CC: followup bladder lesions   HPI: Ms Slape is a 51yo here for cystoscopy for benign bladder lesions Blood pressure 126/83, pulse 92. NED. A&Ox3.   No respiratory distress   Abd soft, NT, ND Normal external genitalia with patent urethral meatus  Cystoscopy Procedure Note  Patient identification was confirmed, informed consent was obtained, and patient was prepped using Betadine solution.  Lidocaine jelly was administered per urethral meatus.    Procedure: - Flexible cystoscope introduced, without any difficulty.   - Thorough search of the bladder revealed:    normal urethral meatus    normal urothelium    no stones    no ulcers     no tumors    no urethral polyps    no trabeculation  - Ureteral orifices were normal in position and appearance.  Post-Procedure: - Patient tolerated the procedure well  Assessment/ Plan: Urine for cytology    No follow-ups on file.  Nicolette Bang, MD

## 2022-02-05 NOTE — Progress Notes (Signed)
   02/05/22  CC: followup bladder lesions   HPI: [name] is a 51yo here for cystoscopy for benign bladder lesions Blood pressure 126/83, pulse 92. NED. A&Ox3.   No respiratory distress   Abd soft, NT, ND Normal external genitalia with patent urethral meatus  Cystoscopy Procedure Note  Patient identification was confirmed, informed consent was obtained, and patient was prepped using Betadine solution.  Lidocaine jelly was administered per urethral meatus.    Procedure: - Flexible cystoscope introduced, without any difficulty.   - Thorough search of the bladder revealed:    normal urethral meatus    normal urothelium    no stones    no ulcers     no tumors    no urethral polyps    no trabeculation  - Ureteral orifices were normal in position and appearance.  Post-Procedure: - Patient tolerated the procedure well  Assessment/ Plan: Urine for cytology    No follow-ups on file.  Nicolette Bang, MD

## 2022-02-06 LAB — CYTOLOGY - NON PAP

## 2022-02-09 ENCOUNTER — Encounter: Payer: Self-pay | Admitting: Urology

## 2022-02-10 ENCOUNTER — Telehealth: Payer: Self-pay

## 2022-02-10 ENCOUNTER — Other Ambulatory Visit: Payer: Self-pay | Admitting: Urology

## 2022-02-10 MED ORDER — OXYCODONE-ACETAMINOPHEN 5-325 MG PO TABS: 1.0000 | ORAL_TABLET | ORAL | 0 refills | Status: DC | PRN

## 2022-02-10 NOTE — Telephone Encounter (Signed)
I spoke with [name]. We have discussed possible surgery dates and 02/13/2022 was agreed upon by all parties. Patient given information about surgery date, what to expect pre-operatively and post operatively.    We discussed that a pre-op nurse will be calling to set up the pre-op visit that will take place prior to surgery. Informed patient that our office will communicate any additional care to be provided after surgery.    Patients questions or concerns were discussed during our call. Advised to call our office should there be any additional information, questions or concerns that arise. Patient verbalized understanding.

## 2022-02-11 ENCOUNTER — Inpatient Hospital Stay (HOSPITAL_COMMUNITY): Admission: RE | Admit: 2022-02-11 | Payer: Medicare HMO | Source: Ambulatory Visit

## 2022-02-12 ENCOUNTER — Other Ambulatory Visit: Payer: Self-pay

## 2022-02-12 ENCOUNTER — Encounter (HOSPITAL_COMMUNITY): Payer: Self-pay

## 2022-02-13 ENCOUNTER — Encounter (HOSPITAL_COMMUNITY)
Admission: RE | Admit: 2022-02-13 | Discharge: 2022-02-13 | Disposition: A | Discharge status: Home / Self Care | Payer: Medicare HMO | Source: Ambulatory Visit | Attending: Urology | Admitting: Urology

## 2022-02-13 VITALS — Ht 63.0 in | Wt 213.6 lb

## 2022-02-13 DIAGNOSIS — Z01818 Encounter for other preprocedural examination: Secondary | ICD-10-CM

## 2022-02-17 ENCOUNTER — Encounter (HOSPITAL_COMMUNITY): Payer: Self-pay | Admitting: Urology

## 2022-02-17 ENCOUNTER — Encounter: Payer: Self-pay | Admitting: Urology

## 2022-02-17 ENCOUNTER — Ambulatory Visit (HOSPITAL_COMMUNITY): Payer: Medicare HMO

## 2022-02-17 ENCOUNTER — Ambulatory Visit (HOSPITAL_COMMUNITY): Payer: Medicare HMO | Admitting: Anesthesiology

## 2022-02-17 ENCOUNTER — Ambulatory Visit (HOSPITAL_COMMUNITY)
Admission: RE | Admit: 2022-02-17 | Discharge: 2022-02-17 | Disposition: A | Discharge status: Home / Self Care | Payer: Medicare HMO | Source: Ambulatory Visit | Attending: Urology | Admitting: Urology

## 2022-02-17 ENCOUNTER — Ambulatory Visit (HOSPITAL_BASED_OUTPATIENT_CLINIC_OR_DEPARTMENT_OTHER): Payer: Medicare HMO | Admitting: Anesthesiology

## 2022-02-17 ENCOUNTER — Encounter (HOSPITAL_COMMUNITY)
Admission: RE | Disposition: A | Discharge status: Home / Self Care | Payer: Self-pay | Source: Ambulatory Visit | Attending: Urology

## 2022-02-17 DIAGNOSIS — N183 Chronic kidney disease, stage 3 unspecified: Secondary | ICD-10-CM

## 2022-02-17 DIAGNOSIS — N200 Calculus of kidney: Secondary | ICD-10-CM | POA: Insufficient documentation

## 2022-02-17 DIAGNOSIS — I129 Hypertensive chronic kidney disease with stage 1 through stage 4 chronic kidney disease, or unspecified chronic kidney disease: Secondary | ICD-10-CM | POA: Diagnosis not present

## 2022-02-17 DIAGNOSIS — F17210 Nicotine dependence, cigarettes, uncomplicated: Secondary | ICD-10-CM

## 2022-02-17 DIAGNOSIS — E039 Hypothyroidism, unspecified: Secondary | ICD-10-CM | POA: Diagnosis not present

## 2022-02-17 DIAGNOSIS — Z01818 Encounter for other preprocedural examination: Secondary | ICD-10-CM

## 2022-02-17 HISTORY — PX: CYSTOSCOPY WITH RETROGRADE PYELOGRAM, URETEROSCOPY AND STENT PLACEMENT: SHX5789

## 2022-02-17 LAB — POCT PREGNANCY, URINE: Preg Test, Ur: NEGATIVE

## 2022-02-17 SURGERY — CYSTOURETEROSCOPY, WITH RETROGRADE PYELOGRAM AND STENT INSERTION
Anesthesia: General | Site: Ureter | Laterality: Bilateral

## 2022-02-17 MED ORDER — OXYCODONE HCL 5 MG PO TABS: 5.0000 mg | ORAL_TABLET | Freq: Once | ORAL | Status: DC | PRN

## 2022-02-17 MED ORDER — DEXAMETHASONE SODIUM PHOSPHATE 10 MG/ML IJ SOLN
INTRAMUSCULAR | Status: DC | PRN
  Administered 2022-02-17: 6 mg via INTRAVENOUS

## 2022-02-17 MED ORDER — ONDANSETRON HCL 4 MG/2ML IJ SOLN: 4.0000 mg | Freq: Once | INTRAMUSCULAR | Status: DC | PRN

## 2022-02-17 MED ORDER — FENTANYL CITRATE (PF) 100 MCG/2ML IJ SOLN
INTRAMUSCULAR | Status: AC
  Filled 2022-02-17: qty 2

## 2022-02-17 MED ORDER — FENTANYL CITRATE PF 50 MCG/ML IJ SOSY: 25.0000 ug | PREFILLED_SYRINGE | INTRAMUSCULAR | Status: DC | PRN

## 2022-02-17 MED ORDER — ONDANSETRON HCL 4 MG/2ML IJ SOLN
INTRAMUSCULAR | Status: DC | PRN
  Administered 2022-02-17: 4 mg via INTRAVENOUS

## 2022-02-17 MED ORDER — ONDANSETRON HCL 4 MG PO TABS: 4.0000 mg | ORAL_TABLET | Freq: Every day | ORAL | 1 refills | Status: DC | PRN

## 2022-02-17 MED ORDER — OXYCODONE-ACETAMINOPHEN 5-325 MG PO TABS: 1.0000 | ORAL_TABLET | ORAL | 0 refills | Status: DC | PRN

## 2022-02-17 MED ORDER — WATER FOR IRRIGATION, STERILE IR SOLN
Status: DC | PRN
  Administered 2022-02-17: 500 mL

## 2022-02-17 MED ORDER — MIDAZOLAM HCL 2 MG/2ML IJ SOLN
INTRAMUSCULAR | Status: AC
  Filled 2022-02-17: qty 2

## 2022-02-17 MED ORDER — SODIUM CHLORIDE 0.9 % IR SOLN
Status: DC | PRN
  Administered 2022-02-17 (×2): 3000 mL

## 2022-02-17 MED ORDER — DIATRIZOATE MEGLUMINE 30 % UR SOLN
URETHRAL | Status: AC
  Filled 2022-02-17: qty 100

## 2022-02-17 MED ORDER — LACTATED RINGERS IV SOLN
INTRAVENOUS | Status: DC
  Administered 2022-02-17: 1000 mL via INTRAVENOUS

## 2022-02-17 MED ORDER — OXYCODONE HCL 5 MG/5ML PO SOLN: 5.0000 mg | Freq: Once | ORAL | Status: DC | PRN

## 2022-02-17 MED ORDER — ONDANSETRON HCL 4 MG/2ML IJ SOLN
INTRAMUSCULAR | Status: AC
  Filled 2022-02-17: qty 2

## 2022-02-17 MED ORDER — DEXAMETHASONE SODIUM PHOSPHATE 10 MG/ML IJ SOLN
INTRAMUSCULAR | Status: AC
  Filled 2022-02-17: qty 1

## 2022-02-17 MED ORDER — PROPOFOL 10 MG/ML IV BOLUS
INTRAVENOUS | Status: AC
  Filled 2022-02-17: qty 20

## 2022-02-17 MED ORDER — LIDOCAINE HCL (CARDIAC) PF 100 MG/5ML IV SOSY
PREFILLED_SYRINGE | INTRAVENOUS | Status: DC | PRN
  Administered 2022-02-17: 100 mg via INTRAVENOUS

## 2022-02-17 MED ORDER — CHLORHEXIDINE GLUCONATE 0.12 % MT SOLN
15.0000 mL | Freq: Once | OROMUCOSAL | Status: AC
  Administered 2022-02-17: 15 mL via OROMUCOSAL

## 2022-02-17 MED ORDER — ORAL CARE MOUTH RINSE: 15.0000 mL | Freq: Once | OROMUCOSAL | Status: AC

## 2022-02-17 MED ORDER — MIDAZOLAM HCL 5 MG/5ML IJ SOLN
INTRAMUSCULAR | Status: DC | PRN
  Administered 2022-02-17: 2 mg via INTRAVENOUS

## 2022-02-17 MED ORDER — DIATRIZOATE MEGLUMINE 30 % UR SOLN
URETHRAL | Status: DC | PRN
  Administered 2022-02-17: 13 mL via URETHRAL

## 2022-02-17 MED ORDER — FENTANYL CITRATE (PF) 100 MCG/2ML IJ SOLN
INTRAMUSCULAR | Status: DC | PRN
  Administered 2022-02-17 (×2): 50 ug via INTRAVENOUS
  Administered 2022-02-17: 100 ug via INTRAVENOUS

## 2022-02-17 MED ORDER — FENTANYL CITRATE PF 50 MCG/ML IJ SOSY
50.0000 ug | PREFILLED_SYRINGE | INTRAMUSCULAR | Status: DC | PRN
  Administered 2022-02-17: 50 ug via INTRAVENOUS

## 2022-02-17 MED ORDER — PROPOFOL 10 MG/ML IV BOLUS
INTRAVENOUS | Status: DC | PRN
  Administered 2022-02-17: 200 mg via INTRAVENOUS

## 2022-02-17 MED ORDER — CEFAZOLIN SODIUM-DEXTROSE 2-4 GM/100ML-% IV SOLN
2.0000 g | INTRAVENOUS | Status: AC
  Administered 2022-02-17: 2 g via INTRAVENOUS
  Filled 2022-02-17: qty 100

## 2022-02-17 SURGICAL SUPPLY — 26 items
BAG DRAIN URO TABLE W/ADPT NS (BAG) ×2 IMPLANT
BAG DRN 8 ADPR NS SKTRN CSTL (BAG) ×2
BAG HAMPER (MISCELLANEOUS) ×2 IMPLANT
CATH INTERMIT  6FR 70CM (CATHETERS) ×2 IMPLANT
CLOTH BEACON ORANGE TIMEOUT ST (SAFETY) ×2 IMPLANT
EXTRACTOR STONE NITINOL NGAGE (UROLOGICAL SUPPLIES) ×1 IMPLANT
GLOVE BIO SURGEON STRL SZ7 (GLOVE) ×1 IMPLANT
GLOVE BIO SURGEON STRL SZ8 (GLOVE) ×2 IMPLANT
GLOVE BIOGEL PI IND STRL 7.0 (GLOVE) ×5 IMPLANT
GOWN STRL REUS W/TWL LRG LVL3 (GOWN DISPOSABLE) ×3 IMPLANT
GOWN STRL REUS W/TWL XL LVL3 (GOWN DISPOSABLE) ×2 IMPLANT
GUIDEWIRE STR DUAL SENSOR (WIRE) ×2 IMPLANT
GUIDEWIRE STR ZIPWIRE 035X150 (MISCELLANEOUS) ×2 IMPLANT
IV NS IRRIG 3000ML ARTHROMATIC (IV SOLUTION) ×4 IMPLANT
KIT TURNOVER CYSTO (KITS) ×2 IMPLANT
MANIFOLD NEPTUNE II (INSTRUMENTS) ×2 IMPLANT
PACK CYSTO (CUSTOM PROCEDURE TRAY) ×2 IMPLANT
PAD ARMBOARD 7.5X6 YLW CONV (MISCELLANEOUS) ×2 IMPLANT
SHEATH URETERAL 12FRX35CM (MISCELLANEOUS) ×1 IMPLANT
STENT URET 6FRX24 CONTOUR (STENTS) ×2 IMPLANT
SYR 10ML LL (SYRINGE) ×2 IMPLANT
SYR CONTROL 10ML LL (SYRINGE) ×2 IMPLANT
TOWEL OR 17X26 4PK STRL BLUE (TOWEL DISPOSABLE) ×2 IMPLANT
TRACTIP FLEXIVA PULS ID 200XHI (Laser) IMPLANT
TRACTIP FLEXIVA PULSE ID 200 (Laser)
WATER STERILE IRR 500ML POUR (IV SOLUTION) ×2 IMPLANT

## 2022-02-17 NOTE — Interval H&P Note (Signed)
History and Physical Interval Note:  02/17/2022 10:57 AM  [name]  has presented today for surgery, with the diagnosis of bilateral renal calculi.  The various methods of treatment have been discussed with the patient and family. After consideration of risks, benefits and other options for treatment, the patient has consented to  Procedure(s): CYSTOSCOPY WITH RETROGRADE PYELOGRAM, URETEROSCOPY AND STENT PLACEMENT (Bilateral) HOLMIUM LASER APPLICATION (Bilateral) as a surgical intervention.  The patient's history has been reviewed, patient examined, no change in status, stable for surgery.  I have reviewed the patient's chart and labs.  Questions were answered to the patient's satisfaction.     Nicolette Bang

## 2022-02-17 NOTE — Anesthesia Preprocedure Evaluation (Signed)
Anesthesia Evaluation  Patient identified by MRN, date of birth, ID band Patient awake    Reviewed: Allergy & Precautions, H&P , NPO status , Patient's Chart, lab work & pertinent test results, reviewed documented beta blocker date and time   Airway Mallampati: II  TM Distance: >3 FB Neck ROM: full    Dental no notable dental hx.    Pulmonary neg pulmonary ROS, Current Smoker and Patient abstained from smoking.,    Pulmonary exam normal breath sounds clear to auscultation       Cardiovascular Exercise Tolerance: Good hypertension, negative cardio ROS   Rhythm:regular Rate:Normal     Neuro/Psych PSYCHIATRIC DISORDERS Anxiety Depression Bipolar Disorder negative neurological ROS     GI/Hepatic negative GI ROS, Neg liver ROS,   Endo/Other  Hypothyroidism   Renal/GU CRFRenal disease  negative genitourinary   Musculoskeletal   Abdominal   Peds  Hematology negative hematology ROS (+)   Anesthesia Other Findings   Reproductive/Obstetrics negative OB ROS                             Anesthesia Physical Anesthesia Plan  ASA: 3  Anesthesia Plan: General and General LMA   Post-op Pain Management:    Induction:   PONV Risk Score and Plan: Ondansetron  Airway Management Planned:   Additional Equipment:   Intra-op Plan:   Post-operative Plan:   Informed Consent: I have reviewed the patients History and Physical, chart, labs and discussed the procedure including the risks, benefits and alternatives for the proposed anesthesia with the patient or authorized representative who has indicated his/her understanding and acceptance.     Dental Advisory Given  Plan Discussed with: CRNA  Anesthesia Plan Comments:         Anesthesia Quick Evaluation

## 2022-02-17 NOTE — Transfer of Care (Signed)
Immediate Anesthesia Transfer of Care Note  Patient: Mickie D Searles  Procedure(s) Performed: CYSTOSCOPY WITH RETROGRADE PYELOGRAM, URETEROSCOPY AND STENT PLACEMENT (Bilateral: Ureter)  Patient Location: PACU  Anesthesia Type:General  Level of Consciousness: awake, drowsy and patient cooperative  Airway & Oxygen Therapy: Patient Spontanous Breathing and Patient connected to nasal cannula oxygen  Post-op Assessment: Report given to RN, Post -op Vital signs reviewed and stable and Patient moving all extremities X 4  Post vital signs: Reviewed and stable  Last Vitals:  Vitals Value Taken Time  BP 136/75 02/17/22 1230  Temp    Pulse 109 02/17/22 1232  Resp 18 02/17/22 1232  SpO2 93 % 02/17/22 1232  Vitals shown include unvalidated device data.  Last Pain:  Vitals:   02/17/22 1009  TempSrc: Oral  PainSc: 8          Complications: No notable events documented.

## 2022-02-17 NOTE — Op Note (Signed)
.  Preoperative diagnosis: bilateral renal calculi  Postoperative diagnosis: Same  Procedure: 1 cystoscopy 2. Bilateral retrograde pyelography 3.  Intraoperative fluoroscopy, under one hour, with interpretation 4.  Bilateral ureteroscopic stone manipulation with basket extraction 5.  bilateral 6 x 24 JJ stent  placement  Attending: Rosie Fate  Anesthesia: General  Estimated blood loss: None  Drains: bilateral 6 x 24 JJ ureteral stent without tether  Specimens: stone for analysis  Antibiotics: ancef  Findings: bilateral mid and lower pole renal calculi. No hydronephrosis. No masses/lesions in the bladder. Ureteral orifices in normal anatomic location.  Indications: Patient is a 51 year old female with a history of bilateral renal calculi and bilateral flank pain. After discussing treatment options, they decided proceed with bilateral ureteroscopic stone manipulation.  Procedure in detail: The patient was brought to the operating room and a brief timeout was done to ensure correct patient, correct procedure, correct site.  General anesthesia was administered patient was placed in dorsal lithotomy position.  Her genitalia was then prepped and draped in usual sterile fashion.  A rigid 73 French cystoscope was passed in the urethra and the bladder.  Bladder was inspected free masses or lesions.  the ureteral orifices were in the normal orthotopic locations. a 6 french ureteral catheter was then instilled into the left ureteral orifice.  a gentle retrograde was obtained and findings noted above. We then advanced a zipwire through the catheter and up to the renal pelvis.  we then removed the cystoscope and cannulated the left ureteral orifice with a semirigid ureteroscope.  We located no stone in the ureter. We then placed a sensor wire up to the renal pelvis. We removed the scope and advanced a 11/13 x 35cm access sheath up to the renal pelvis. We then used the flexible ureteroscope to  perform nephroscopy. We located calculi int he mid and lower poles which were removed with an NGage basket. Once the stone were removed we then removed the access sheath under direct vision and noted to injury to the ureter.  We then placed a 6 x 24 double-j ureteral stent over the original zip wire. We then removed the wire and good coil was noted in the the renal pelvis under fluoroscopy and the bladder under direct vision.  We then turned out attention to the right side. We then advanced a zipwire through the catheter and up to the renal pelvis.  we then removed the cystoscope and cannulated the right ureteral orifice with a semirigid ureteroscope.  We located no stone in the ureter. We then placed a sensor wire up to the renal pelvis. We removed the scope and advanced a 11/13 x 35cm access sheath up to the renal pelvis. We then used the flexible ureteroscope to perform nephroscopy. We located calculi int he mid and lower poles which were removed with an NGage basket. Once the stone were removed we then removed the access sheath under direct vision and noted to injury to the ureter. we then placed a 6 x 24 double-j ureteral stent over the original zip wire.  We then removed the wire and good coil was noted in the the renal pelvis under fluoroscopy and the bladder under direct vision.   the bladder was then drained and this concluded the procedure which was well tolerated by patient.  Complications: None  Condition: Stable, extubated, transferred to PACU  Plan: Patient is to be discharged home as to follow-up in 1 week for voiding trial

## 2022-02-17 NOTE — Anesthesia Postprocedure Evaluation (Signed)
Anesthesia Post Note  Patient: Arlyce D Digman  Procedure(s) Performed: CYSTOSCOPY WITH RETROGRADE PYELOGRAM, URETEROSCOPY AND STENT PLACEMENT (Bilateral: Ureter)  Patient location during evaluation: Phase II Anesthesia Type: General Level of consciousness: awake Pain management: pain level controlled Vital Signs Assessment: post-procedure vital signs reviewed and stable Respiratory status: spontaneous breathing and respiratory function stable Cardiovascular status: blood pressure returned to baseline and stable Postop Assessment: no headache and no apparent nausea or vomiting Anesthetic complications: no Comments: Late entry   No notable events documented.   Last Vitals:  Vitals:   02/17/22 1245 02/17/22 1309  BP: (!) 149/81 129/79  Pulse: (!) 106 89  Resp: 18 17  Temp:  36.7 C  SpO2: 93% 91%    Last Pain:  Vitals:   02/17/22 1309  TempSrc: Oral  PainSc: [place]

## 2022-02-17 NOTE — Anesthesia Procedure Notes (Signed)
Procedure Name: LMA Insertion Date/Time: 02/17/2022 11:35 AM  Performed by: Jonna Munro, CRNAPre-anesthesia Checklist: Patient identified, Emergency Drugs available, Suction available, Patient being monitored and Timeout performed Patient Re-evaluated:Patient Re-evaluated prior to induction Oxygen Delivery Method: Circle system utilized Preoxygenation: Pre-oxygenation with 100% oxygen Induction Type: IV induction LMA: LMA inserted LMA Size: 4.0 Number of attempts: 1 Tube secured with: Tape Dental Injury: Teeth and Oropharynx as per pre-operative assessment

## 2022-02-19 ENCOUNTER — Ambulatory Visit (INDEPENDENT_AMBULATORY_CARE_PROVIDER_SITE_OTHER): Payer: Medicare HMO | Admitting: Urology

## 2022-02-19 VITALS — BP 142/72 | HR 105

## 2022-02-19 DIAGNOSIS — N200 Calculus of kidney: Secondary | ICD-10-CM

## 2022-02-19 DIAGNOSIS — R350 Frequency of micturition: Secondary | ICD-10-CM

## 2022-02-19 MED ORDER — OXYCODONE HCL 15 MG PO TABS: 15.0000 mg | ORAL_TABLET | ORAL | 0 refills | Status: DC | PRN

## 2022-02-19 MED ORDER — MIRABEGRON ER 25 MG PO TB24: 25.0000 mg | ORAL_TABLET | Freq: Every day | ORAL | 0 refills | Status: DC

## 2022-02-19 NOTE — Progress Notes (Signed)
02/19/2022 12:13 PM   MELANYE HIRALDO 02/22/1971 [id number]  Referring provider: Lindell Spar, MD [address] [place],  [place] [address]  Urinary urgency and frequency   HPI: Ms Narayan is a 51yo here for evaluation of urinary urgency and urinary frequency. She has left flank pain with the ureteral stent in place. The percocet '5mg'$  is not improving the pain. Since stent placement she has been having urge incontinence and is having to wear pads. She is having dysuria and left flank pain with urination. No fevers. No other complaints today   PMH: Past Medical History:  Diagnosis Date   Anxiety    Chronic kidney disease    CKD (chronic kidney disease) stage 3, GFR 30-59 ml/min (HCC) 03/15/2019   Depression    Diarrhea 05/04/2017   Essential hypertension, benign 03/15/2019   Hypertension    Hypothyroidism    Hypothyroidism, adult 03/15/2019   Kidney stones    LGSIL on Pap smear of cervix 08/17/2020   Needs colpo per [facility] immediate risk is 4.3 for CIN 3+risk    Obesity (BMI 30-39.9) 03/15/2019   Renal disorder    Vitamin D deficiency disease 03/15/2019    Surgical History: Past Surgical History:  Procedure Laterality Date   BIOPSY  06/05/2017   Procedure: BIOPSY;  Surgeon: Rogene Houston, MD;  [facility];  Service: Endoscopy;;  colon   CHOLECYSTECTOMY     COLONOSCOPY N/A 06/05/2017   Procedure: COLONOSCOPY;  Surgeon: Rogene Houston, MD;  Location: [facility];  Service: Endoscopy;  Laterality: N/A;  8:30   CYSTOSCOPY WITH RETROGRADE PYELOGRAM, URETEROSCOPY AND STENT PLACEMENT Left 10/04/2020   Procedure: CYSTOSCOPY WITH LEFT RETROGRADE PYELOGRAM, DIAGNOSTIC LEFT URETEROSCOPY AND LEFT URETEREAL STENT PLACEMENT;  Surgeon: Cleon Gustin, MD;  Location: [facility];  Service: Urology;  Laterality: Left;   CYSTOSCOPY WITH RETROGRADE PYELOGRAM, URETEROSCOPY AND STENT PLACEMENT Left 10/16/2020   Procedure: CYSTOSCOPY WITH LEFT RETROGRADE PYELOGRAM, LEFT URETEROSCOPY   WITH LASER AND LEFT URETERAL STENT EXCHANGE;  Surgeon: Cleon Gustin, MD;  Location: [facility];  Service: Urology;  Laterality: Left;   HOLMIUM LASER APPLICATION Left 01/29/5620   Procedure: HOLMIUM LASER APPLICATION;  Surgeon: Cleon Gustin, MD;  Location: [facility];  Service: Urology;  Laterality: Left;   KIDNEY STONE SURGERY     kidney stones     LASER ABLATION CONDOLAMATA N/A 10/31/2020   Procedure: LASER ABLATION OF THE CERVIX;  Surgeon: Florian Buff, MD;  Location: [facility];  Service: Gynecology;  Laterality: N/A;    Home Medications:  Allergies as of 02/19/2022       Reactions   Morphine And Related Itching, Hives   Sulfa Antibiotics Swelling   Tape Itching, Other (See Comments)   Surgical         Medication List        Accurate as of February 19, 2022 12:13 PM. If you have any questions, ask your nurse or doctor.          ALPRAZolam 1 MG tablet Commonly known as: XANAX Take 1 mg by mouth 3 (three) times daily as needed for anxiety.   amantadine 100 MG capsule Commonly known as: SYMMETREL Take 100 mg by mouth at bedtime.   amLODipine 5 MG tablet Commonly known as: NORVASC Take 1 tablet (5 mg total) by mouth in the morning and at bedtime.   ARIPiprazole 5 MG tablet Commonly known as: ABILIFY Take 5 mg by mouth at bedtime.  carvedilol 25 MG tablet Commonly known as: COREG Take 25 mg by mouth 2 (two) times daily.   chlorthalidone 25 MG tablet Commonly known as: HYGROTON Take 12.5 mg by mouth daily.   cloNIDine 0.1 MG tablet Commonly known as: CATAPRES Take 1 tablet (0.1 mg total) by mouth every evening.   doxepin 75 MG capsule Commonly known as: SINEQUAN Take 225 mg by mouth at bedtime.   lamoTRIgine 200 MG tablet Commonly known as: LAMICTAL Take 200 mg by mouth 2 (two) times daily.   losartan 25 MG tablet Commonly known as: COZAAR Take 25 mg by mouth in the morning.   medroxyPROGESTERone 150 MG/ML injection Commonly known as:  DEPO-PROVERA Inject 1 mL (150 mg total) into the muscle every 3 (three) months.   nitrofurantoin 50 MG capsule Commonly known as: MACRODANTIN Take 1 capsule (50 mg total) by mouth at bedtime.   ondansetron 4 MG tablet Commonly known as: Zofran Take 1 tablet (4 mg total) by mouth daily as needed for nausea or vomiting.   oxyCODONE-acetaminophen 5-325 MG tablet Commonly known as: Percocet Take 1 tablet by mouth every 4 (four) hours as needed.   potassium citrate 5 MEQ (540 MG) SR tablet Commonly known as: UROCIT-K Take 10 mEq by mouth 3 (three) times daily.   QUEtiapine 400 MG 24 hr tablet Commonly known as: SEROQUEL XR Take 400 mg by mouth at bedtime.   rosuvastatin 10 MG tablet Commonly known as: CRESTOR TAKE 1 TABLET BY MOUTH DAILY   Vitamin D3 125 MCG (5000 UT) Tabs Take 10,000 Units by mouth daily.        Allergies:  Allergies  Allergen Reactions   Morphine And Related Itching and Hives   Sulfa Antibiotics Swelling   Tape Itching and Other (See Comments)    Surgical     Family History: Family History  Problem Relation Age of Onset   Cancer Paternal Grandmother    Heart failure Paternal Grandmother    Cancer Maternal Grandmother    Cancer Father    Heart failure Father    Lung cancer Mother    Colon cancer Neg Hx     Social History:  reports that she has been smoking cigarettes. She has a 10.00 pack-year smoking history. She has never used smokeless tobacco. She reports that she does not drink alcohol and does not use drugs.  ROS: All other review of systems were reviewed and are negative except what is noted above in HPI  Physical Exam: BP (!) 142/72   Pulse (!) 105   Constitutional:  Alert and oriented, No acute distress. HEENT: [place] AT, moist mucus membranes.  Trachea midline, no masses. Cardiovascular: No clubbing, cyanosis, or edema. Respiratory: Normal respiratory effort, no increased work of breathing. GI: Abdomen is soft, nontender,  nondistended, no abdominal masses GU: No CVA tenderness.  Lymph: No cervical or inguinal lymphadenopathy. Skin: No rashes, bruises or suspicious lesions. Neurologic: Grossly intact, no focal deficits, moving all 4 extremities. Psychiatric: Normal mood and affect.  Laboratory Data: Lab Results  Component Value Date   WBC 7.2 10/30/2020   HGB 12.6 10/30/2020   HCT 39.8 10/30/2020   MCV 89.4 10/30/2020   PLT 410 (H) 10/30/2020    Lab Results  Component Value Date   CREATININE 1.52 (H) 08/31/2021    No results found for: "PSA"  No results found for: "TESTOSTERONE"  Lab Results  Component Value Date   HGBA1C 5.2 09/04/2021    Urinalysis    Component Value Date/Time  COLORURINE YELLOW 10/30/2020 1355   APPEARANCEUR Clear 02/05/2022 0852   LABSPEC 1.025 10/30/2020 1355   PHURINE 5.0 10/30/2020 1355   GLUCOSEU Negative 02/05/2022 0852   HGBUR NEGATIVE 10/30/2020 1355   BILIRUBINUR Negative 02/05/2022 0852   KETONESUR NEGATIVE 10/30/2020 1355   PROTEINUR 1+ (A) 02/05/2022 0852   PROTEINUR 30 (A) 10/30/2020 1355   UROBILINOGEN 0.2 01/17/2009 0124   NITRITE Negative 02/05/2022 0852   NITRITE NEGATIVE 10/30/2020 1355   LEUKOCYTESUR Trace (A) 02/05/2022 0852   LEUKOCYTESUR SMALL (A) 10/30/2020 1355    Lab Results  Component Value Date   LABMICR See below: 02/05/2022   WBCUA 6-10 (A) 02/05/2022   LABEPIT >10 (A) 02/05/2022   MUCUS Present 09/23/2021   BACTERIA None seen 02/05/2022    Pertinent Imaging:  Results for orders placed during the hospital encounter of 08/28/20  Abdomen 1 view (KUB)  Narrative CLINICAL DATA:  Nephrolithiasis  EXAM: ABDOMEN - 1 VIEW  COMPARISON:  CT 08/21/2020  FINDINGS: The bowel gas pattern is normal. 8 mm stone again seen projecting over the left renal shadow. No right sided renal calculi, which is partially obscured by overlying bowel gas and stool. Moderate volume stool throughout the colon. No acute osseous findings.  Other significant radiographic abnormality are seen.  IMPRESSION: 8 mm stone projecting over the left renal shadow, similar to prior exam.   Electronically Signed By: Davina Poke D.O. On: 08/29/2020 10:41  No results found for this or any previous visit.  No results found for this or any previous visit.  No results found for this or any previous visit.  Results for orders placed during the hospital encounter of 08/30/21  Ultrasound renal complete  Narrative CLINICAL DATA:  History of nephrolithiasis.  EXAM: RENAL / URINARY TRACT ULTRASOUND COMPLETE  COMPARISON:  Ultrasound 03/18/2021.  FINDINGS: Right Kidney:  Renal measurements: 10.1 x 5.1 x 4.8 cm = volume: 130.0 mL. Echogenicity within normal limits. No mass or hydronephrosis visualized. 6 mm nonobstructive renal calculus.  Left Kidney:  Renal measurements: 7.7 x 3.9 x 4.4 cm = volume: 68.9 mL. Left renal cortical thinning. No mass or hydronephrosis visualized 8.6 punctate nonobstructing calculus.  Bladder:  Appears normal for degree of bladder distention.  Other:  None.  IMPRESSION: 1.  Bilateral nonobstructing renal calyceal stones.  2. Left renal atrophy again noted. No acute abnormality. No hydronephrosis or bladder distention.   Electronically Signed By: Marcello Moores  Register M.D. On: 09/02/2021 08:13  No results found for this or any previous visit.  Results for orders placed during the hospital encounter of 09/09/21  CT HEMATURIA WORKUP  Narrative CLINICAL DATA:  Gross hematuria, chronic cystitis  EXAM: CT ABDOMEN AND PELVIS WITHOUT AND WITH CONTRAST  TECHNIQUE: Multidetector CT imaging of the abdomen and pelvis was performed following the standard protocol before and following the bolus administration of intravenous contrast.  RADIATION DOSE REDUCTION: This exam was performed according to the departmental dose-optimization program which includes automated exposure control,  adjustment of the mA and/or kV according to patient size and/or use of iterative reconstruction technique.  CONTRAST:  1m OMNIPAQUE IOHEXOL 300 MG/ML  SOLN  COMPARISON:  12/17/2020  FINDINGS: Lower chest: No acute abnormality. Bandlike scarring of the bilateral lung bases.  Hepatobiliary: No focal liver abnormality is seen. Status post cholecystectomy. No biliary dilatation.  Pancreas: Unremarkable. No pancreatic ductal dilatation or surrounding inflammatory changes.  Spleen: Normal in size without significant abnormality.  Adrenals/Urinary Tract: Adrenal glands are unremarkable. Multiple small bilateral nonobstructive renal  calculi. No ureteral calculi or hydronephrosis. Relatively atrophic, lobular appearance of the left kidney. No suspicious mass or contrast enhancement. No urinary tract filling defect on delayed phase imaging. Bladder is unremarkable.  Stomach/Bowel: Stomach is within normal limits. Appendix appears normal. No evidence of bowel wall thickening, distention, or inflammatory changes.  Vascular/Lymphatic: No significant vascular findings are present. No enlarged abdominal or pelvic lymph nodes.  Reproductive: No mass or other significant abnormality.  Other: No abdominal wall hernia or abnormality. No ascites.  Musculoskeletal: No acute or significant osseous findings.  IMPRESSION: 1. Multiple small bilateral nonobstructive renal calculi. No ureteral calculi or hydronephrosis. 2. Relatively atrophic, lobular appearance of the left kidney, in keeping with prior obstructive, infectious, or ischemic insult. 3. No suspicious mass or contrast enhancement. No urinary tract filling defect on delayed phase imaging. 4. Normal bladder.   Electronically Signed By: Delanna Ahmadi M.D. On: 09/09/2021 10:55  Results for orders placed in visit on 08/21/20  CT RENAL STONE STUDY  Narrative CLINICAL DATA:  Left flank pain for 3 months.  Hematuria. Nephrolithiasis.  EXAM: CT ABDOMEN AND PELVIS WITHOUT CONTRAST  TECHNIQUE: Multidetector CT imaging of the abdomen and pelvis was performed following the standard protocol without IV contrast.  COMPARISON:  03/21/2019  FINDINGS: Lower chest: No acute findings.  Hepatobiliary: No mass visualized on this unenhanced exam. Prior cholecystectomy. No evidence of biliary obstruction.  Pancreas: No mass or inflammatory process visualized on this unenhanced exam.  Spleen:  Within normal limits in size.  Adrenals/Urinary tract: Moderate diffuse left renal parenchymal atrophy and scarring is again seen. A few renal calculi are seen bilaterally, largest in the lower pole of the left kidney measuring 8 mm. No evidence of ureteral calculi or hydronephrosis. Unremarkable unopacified urinary bladder.  Stomach/Bowel: No evidence of obstruction, inflammatory process, or abnormal fluid collections. Normal appendix visualized.  Vascular/Lymphatic: No pathologically enlarged lymph nodes identified. No evidence of abdominal aortic aneurysm.  Reproductive:  No mass or other significant abnormality.  Other:  None.  Musculoskeletal:  No suspicious bone lesions identified.  IMPRESSION: Bilateral nephrolithiasis. No evidence of ureteral calculi, hydronephrosis, or other acute findings.  Chronic diffuse left renal parenchymal atrophy and scarring.   Electronically Signed By: Marlaine Hind M.D. On: 08/21/2020 16:45   Assessment & Plan:    1. Nephrolithiasis RTC 1 week for stent removal. Rx for percocet given - Urinalysis, Routine w reflex microscopic  2. Urine frequency Mirabegron '25mg'$  daily   No follow-ups on file.  Nicolette Bang, MD  [facility] [place]

## 2022-02-20 LAB — URINALYSIS, ROUTINE W REFLEX MICROSCOPIC
Bilirubin, UA: NEGATIVE
Glucose, UA: NEGATIVE
Ketones, UA: NEGATIVE
Nitrite, UA: NEGATIVE
Specific Gravity, UA: 1.025 (ref 1.005–1.030)
Urobilinogen, Ur: 0.2 mg/dL (ref 0.2–1.0)
pH, UA: 6.5 (ref 5.0–7.5)

## 2022-02-20 LAB — MICROSCOPIC EXAMINATION: RBC, Urine: >30 /hpf — AB (ref 0–2)

## 2022-02-21 ENCOUNTER — Encounter (HOSPITAL_COMMUNITY): Payer: Self-pay | Admitting: Urology

## 2022-02-24 ENCOUNTER — Ambulatory Visit: Payer: Medicare HMO | Admitting: Urology

## 2022-02-24 VITALS — BP 132/83 | HR 108

## 2022-02-24 DIAGNOSIS — Z466 Encounter for fitting and adjustment of urinary device: Secondary | ICD-10-CM | POA: Diagnosis not present

## 2022-02-24 DIAGNOSIS — N200 Calculus of kidney: Secondary | ICD-10-CM | POA: Diagnosis not present

## 2022-02-24 DIAGNOSIS — Z87442 Personal history of urinary calculi: Secondary | ICD-10-CM | POA: Diagnosis not present

## 2022-02-24 LAB — URINALYSIS, ROUTINE W REFLEX MICROSCOPIC
Bilirubin, UA: NEGATIVE
Glucose, UA: NEGATIVE
Ketones, UA: NEGATIVE
Nitrite, UA: NEGATIVE
Specific Gravity, UA: 1.025 (ref 1.005–1.030)
Urobilinogen, Ur: 0.2 mg/dL (ref 0.2–1.0)
pH, UA: 7 (ref 5.0–7.5)

## 2022-02-24 LAB — MICROSCOPIC EXAMINATION: RBC, Urine: >30 /hpf — AB (ref 0–2)

## 2022-02-24 MED ORDER — CIPROFLOXACIN HCL 500 MG PO TABS
500.0000 mg | ORAL_TABLET | Freq: Once | ORAL | Status: AC
  Administered 2022-02-24: 500 mg via ORAL

## 2022-02-24 NOTE — Progress Notes (Unsigned)
   02/24/22  CC: nephrolithiasis   HPI: [name] is a 51yo here for stent removal Blood pressure 132/83, pulse (!) 108. NED. A&Ox3.   No respiratory distress   Abd soft, NT, ND Normal external genitalia with patent urethral meatus  Cystoscopy Procedure Note  Patient identification was confirmed, informed consent was obtained, and patient was prepped using Betadine solution.  Lidocaine jelly was administered per urethral meatus.    Procedure: - Flexible cystoscope introduced, without any difficulty.   - Thorough search of the bladder revealed:    normal urethral meatus    normal urothelium    no stones    no ulcers     no tumors    no urethral polyps    no trabeculation  - Ureteral orifices were normal in position and appearance. Using a grasper the bilateral ureteral stents were removed intact Post-Procedure: - Patient tolerated the procedure well  Assessment/ Plan: RTC 6 weeks with a renal US   No follow-ups on file.  Nicolette Bang, MD

## 2022-02-24 NOTE — Patient Instructions (Signed)

## 2022-02-25 LAB — CALCULI, WITH PHOTOGRAPH (CLINICAL LAB)
Calcium Oxalate Dihydrate: 10 %
Calcium Oxalate Monohydrate: 40 %
Hydroxyapatite: 50 %
Weight Calculi: 7 mg

## 2022-02-26 ENCOUNTER — Encounter: Payer: Self-pay | Admitting: Urology

## 2022-02-26 NOTE — Patient Instructions (Signed)

## 2022-03-05 ENCOUNTER — Ambulatory Visit: Payer: Medicare HMO | Admitting: Internal Medicine

## 2022-03-06 ENCOUNTER — Telehealth: Payer: Self-pay | Admitting: *Deleted

## 2022-03-06 NOTE — Patient Outreach (Signed)
  Care Coordination   03/06/2022 Name: [name] MRN: [id number] DOB: Dec 15, 1970   Care Coordination Outreach Attempts:  An unsuccessful telephone outreach was attempted today to offer the patient information about available care coordination services as a benefit of their health plan.   Follow Up Plan:  Additional outreach attempts will be made to offer the patient care coordination information and services.   Encounter Outcome:  No Answer  Care Coordination Interventions Activated:  No   Care Coordination Interventions:  No, not indicated    SIG Keonta Monceaux L. Lavina Hamman, RN, BSN, [place] Coordinator Office number [phone]

## 2022-03-20 ENCOUNTER — Ambulatory Visit: Payer: Self-pay | Admitting: *Deleted

## 2022-03-20 NOTE — Patient Outreach (Signed)
  Care Coordination   Initial Visit Note   03/20/2022 Name: [name] MRN: [id number] DOB: 1970/10/01  [name] is a 52 y.o. year old female who sees Lindell Spar, MD for primary care. I spoke with  [name] by phone today.  What matters to the patients health and wellness today?  At the vet, unable to speak, will return a call to RN CM     Goals Addressed   None     SDOH assessments and interventions completed:  No     Care Coordination Interventions Activated:  No  Care Coordination Interventions:  No, not indicated   Follow up plan: Follow up call scheduled for for today as patient reports she will return a call to RN CM     Encounter Outcome:  Pt. Request to Call Back   Zaki Gertsch L. Lavina Hamman, RN, BSN, [place] Coordinator Office number [phone]

## 2022-03-27 ENCOUNTER — Ambulatory Visit: Payer: Self-pay | Admitting: *Deleted

## 2022-03-27 NOTE — Patient Instructions (Addendum)
Visit Information  Thank you for taking time to visit with me today. Please don't hesitate to contact me if I can be of assistance to you.   Following are the goals we discussed today:   Goals Addressed               This Visit's Progress     Patient Stated     Falls [facility]) (pt-stated)   Not on track     Care Coordination Interventions: Provided written and verbal education re: potential causes of falls and Fall prevention strategies Reviewed medications and discussed potential side effects of medications such as dizziness and frequent urination Advised patient of importance of notifying provider of falls Assessed for signs and symptoms of orthostatic hypotension Assessed for falls since last encounter Assessed social determinant of health barriers Sent list of medication interactions via email      manage pain on left side ([facility]) (pt-stated)   Not on track     Care Coordination Interventions: Counseled on the importance of reporting any/all new or changed pain symptoms or management strategies to pain management provider Reviewed with patient prescribed pharmacological and nonpharmacological pain relief strategies Outreached to pcp RN , Velna Hatchet for advise/assist (secure chat) after spoke with Margreta Journey at neurology office to confirm patient unable to be assisted by Dr Delice Lesch until after her new patient visit on 05/14/22   Returned a call to patient after collaboration with PCP office manager/RN who requests patient call to office to schedule an appointment so she can have her left side symptoms evaluated. Patient confirms she will call the office         Our next appointment is by telephone on 04/30/22 at 1030  Please call the care guide team at [phone] if you need to cancel or reschedule your appointment.   If you are experiencing a Mental Health or [place] or need someone to talk to, please call the Suicide and Crisis Lifeline: 988 call the [place] National  Suicide Prevention Lifeline: (734) 042-9771 or TTY: 850-514-1952 TTY 726-068-1797) to talk to a trained counselor call 1-800-273-TALK (toll free, 24 hour hotline) call the [facility]: 336-278-1222 call 911   Patient verbalizes understanding of instructions and care plan provided today and agrees to view in [place]. Active MyChart status and patient understanding of how to access instructions and care plan via MyChart confirmed with patient.     The patient has been provided with contact information for the care management team and has been advised to call with any health related questions or concerns.   Taziah Difatta L. Lavina Hamman, RN, BSN, [place] Coordinator Office number [phone]

## 2022-03-27 NOTE — Patient Outreach (Signed)
  Care Coordination   Follow Up Visit Note   03/27/2022 Name: [name] MRN: [id number] DOB: 08-Jan-1971  [name] is a 51 y.o. year old female who sees Lindell Spar, MD for primary care. I spoke with  [name] by phone today.  What matters to the patients health and wellness today?  Falls -4 times in 5 days Frequent falls/syncope episode, loss of conscious Started new blood pressure medication Diastolic BP values 17H  150/56 & light headed on yesterday no nausea   Kidney stone history Important to drink fluids & space medicine that may interact     Goals Addressed               This Visit's Progress     Patient Stated     Falls [facility]) (pt-stated)   Not on track     Care Coordination Interventions: Provided written and verbal education re: potential causes of falls and Fall prevention strategies Reviewed medications and discussed potential side effects of medications such as dizziness and frequent urination Advised patient of importance of notifying provider of falls Assessed for signs and symptoms of orthostatic hypotension Assessed for falls since last encounter Assessed social determinant of health barriers Sent list of medication interactions via email      manage pain on left side ([facility]) (pt-stated)   Not on track     Care Coordination Interventions: Counseled on the importance of reporting any/all new or changed pain symptoms or management strategies to pain management provider Reviewed with patient prescribed pharmacological and nonpharmacological pain relief strategies Outreached to pcp RN , Velna Hatchet for advise/assist (secure chat) after spoke with Margreta Journey at neurology office to confirm patient unable to be assisted by Dr Delice Lesch until after her new patient visit on 05/14/22   Returned a call to patient after collaboration with PCP office manager/RN who requests patient call to office to schedule an appointment so she can have her left side symptoms  evaluated. Patient confirms she will call the office         SDOH assessments and interventions completed:  Yes  SDOH Interventions Today    Flowsheet Row Most Recent Value  SDOH Interventions   Food Insecurity Interventions Intervention Not Indicated  Transportation Interventions Intervention Not Indicated        Care Coordination Interventions Activated:  Yes  Care Coordination Interventions:  Yes, provided   Follow up plan: Follow up call scheduled for 04/30/22    Encounter Outcome:  Pt. Visit Completed   Keilyn Haggard L. Lavina Hamman, RN, BSN, [place] Coordinator Office number [phone]

## 2022-04-04 ENCOUNTER — Encounter: Payer: Self-pay | Admitting: Internal Medicine

## 2022-04-04 ENCOUNTER — Ambulatory Visit (INDEPENDENT_AMBULATORY_CARE_PROVIDER_SITE_OTHER): Payer: Medicare HMO | Admitting: Internal Medicine

## 2022-04-04 VITALS — BP 134/80 | HR 86 | Resp 18 | Ht 63.0 in | Wt 202.4 lb

## 2022-04-04 DIAGNOSIS — I10 Essential (primary) hypertension: Secondary | ICD-10-CM | POA: Diagnosis not present

## 2022-04-04 DIAGNOSIS — E782 Mixed hyperlipidemia: Secondary | ICD-10-CM | POA: Diagnosis not present

## 2022-04-04 DIAGNOSIS — N1832 Chronic kidney disease, stage 3b: Secondary | ICD-10-CM

## 2022-04-04 DIAGNOSIS — R296 Repeated falls: Secondary | ICD-10-CM

## 2022-04-04 DIAGNOSIS — E038 Other specified hypothyroidism: Secondary | ICD-10-CM | POA: Diagnosis not present

## 2022-04-04 DIAGNOSIS — Z1231 Encounter for screening mammogram for malignant neoplasm of breast: Secondary | ICD-10-CM | POA: Diagnosis not present

## 2022-04-04 HISTORY — DX: Repeated falls: R29.6

## 2022-04-04 MED ORDER — ATORVASTATIN CALCIUM 20 MG PO TABS: 20.0000 mg | ORAL_TABLET | Freq: Every day | ORAL | 3 refills | Status: DC

## 2022-04-04 NOTE — Assessment & Plan Note (Signed)
Chart review suggests mildly elevated TSH only once in the past She was on NP thyroid in the past, checked TSH and free T4 - wnl

## 2022-04-04 NOTE — Assessment & Plan Note (Signed)
Likely due to uncontrolled HTN in the past Followed by Nephrology - last visit note reviewed On Losartan and Chlorthalidone Avoid nephrotoxic agents 

## 2022-04-04 NOTE — Progress Notes (Signed)
Established Patient Office Visit  Subjective:  Patient ID: [name], female    DOB: 1970/12/15  Age: 51 y.o. MRN: [id number]  CC:  Chief Complaint  Patient presents with   Follow-up    Follow up pt has been falling a lot she doesn't remember right before falling or right after     HPI [name] is a 51 y.o. female with past medical history of HTN, CKD stage III, bipolar disorder and nephrolithiasis who presents for f/u of her chronic medical conditions.  Her sister is present during the visit today.  She still reports recurrent falls and dizziness.  She reports chronic leg weakness.  Today, she reports that she has been having LOC for few seconds before the fall and remains confused after the fall as well.  Denies any episode of shaking.  HTN: BP was well controlled today.  She has been taking decreased dose of clonidine 0.1 mg daily.  She has seen mild improvement in dizziness since decreasing dose of clonidine.  Denies any headache, chest pain or palpitations.   CKD:  Followed by Nephrology.  Denies any dysuria, hematuria or urinary hesitancy or resistance.    Past Medical History:  Diagnosis Date   Anxiety    Chronic kidney disease    CKD (chronic kidney disease) stage 3, GFR 30-59 ml/min (HCC) 03/15/2019   Depression    Diarrhea 05/04/2017   Essential hypertension, benign 03/15/2019   Hypertension    Hypothyroidism    Hypothyroidism, adult 03/15/2019   Kidney stones    LGSIL on Pap smear of cervix 08/17/2020   Needs colpo per [facility] immediate risk is 4.3 for CIN 3+risk    Obesity (BMI 30-39.9) 03/15/2019   Renal disorder    Vitamin D deficiency disease 03/15/2019    Past Surgical History:  Procedure Laterality Date   BIOPSY  06/05/2017   Procedure: BIOPSY;  Surgeon: Rogene Houston, MD;  [facility];  Service: Endoscopy;;  colon   CHOLECYSTECTOMY     COLONOSCOPY N/A 06/05/2017   Procedure: COLONOSCOPY;  Surgeon: Rogene Houston, MD;  Location: [facility];  Service: Endoscopy;  Laterality: N/A;  8:30   CYSTOSCOPY WITH RETROGRADE PYELOGRAM, URETEROSCOPY AND STENT PLACEMENT Left 10/04/2020   Procedure: CYSTOSCOPY WITH LEFT RETROGRADE PYELOGRAM, DIAGNOSTIC LEFT URETEROSCOPY AND LEFT URETEREAL STENT PLACEMENT;  Surgeon: Cleon Gustin, MD;  Location: [facility];  Service: Urology;  Laterality: Left;   CYSTOSCOPY WITH RETROGRADE PYELOGRAM, URETEROSCOPY AND STENT PLACEMENT Left 10/16/2020   Procedure: CYSTOSCOPY WITH LEFT RETROGRADE PYELOGRAM, LEFT URETEROSCOPY  WITH LASER AND LEFT URETERAL STENT EXCHANGE;  Surgeon: Cleon Gustin, MD;  Location: [facility];  Service: Urology;  Laterality: Left;   CYSTOSCOPY WITH RETROGRADE PYELOGRAM, URETEROSCOPY AND STENT PLACEMENT Bilateral 02/17/2022   Procedure: CYSTOSCOPY WITH RETROGRADE PYELOGRAM, URETEROSCOPY AND STENT PLACEMENT;  Surgeon: Cleon Gustin, MD;  Location: [facility];  Service: Urology;  Laterality: Bilateral;   HOLMIUM LASER APPLICATION Left 06/03/5850   Procedure: HOLMIUM LASER APPLICATION;  Surgeon: Cleon Gustin, MD;  Location: [facility];  Service: Urology;  Laterality: Left;   KIDNEY STONE SURGERY     kidney stones     LASER ABLATION CONDOLAMATA N/A 10/31/2020   Procedure: LASER ABLATION OF THE CERVIX;  Surgeon: Florian Buff, MD;  Location: [facility];  Service: Gynecology;  Laterality: N/A;    Family History  Problem Relation Age of Onset   Cancer Paternal Grandmother    Heart failure Paternal  Grandmother    Cancer Maternal Grandmother    Cancer Father    Heart failure Father    Lung cancer Mother    Colon cancer Neg Hx     Social History   Socioeconomic History   Marital status: Divorced    Spouse name: Not on file   Number of children: Not on file   Years of education: Not on file   Highest education level: Not on file  Occupational History   Not on file  Tobacco Use   Smoking status: Former    Packs/day: 0.50    Years: 20.00    Total pack years: 10.00    Types:  Cigarettes    Quit date: 02/08/2022    Years since quitting: 0.1   Smokeless tobacco: Never  Vaping Use   Vaping Use: Never used  Substance and Sexual Activity   Alcohol use: No   Drug use: No   Sexual activity: Not Currently    Birth control/protection: Injection  Other Topics Concern   Not on file  Social History Narrative   Divorced twice,1st lasted 5 years,2nd 4 years.On disabilty secondary to mental illness since 2004.Previously used to [employer]Lives alone,has 2 cats.   Social Determinants of Health   Financial Resource Strain: Medium Risk (08/06/2020)   Overall Financial Resource Strain (CARDIA)    Difficulty of Paying Living Expenses: Somewhat hard  Food Insecurity: No Food Insecurity (03/27/2022)   Hunger Vital Sign    Worried About Running Out of Food in the Last Year: Never true    Ran Out of Food in the Last Year: Never true  Transportation Needs: No Transportation Needs (03/27/2022)   PRAPARE - Hydrologist (Medical): No    Lack of Transportation (Non-Medical): No  Physical Activity: Insufficiently Active (08/06/2020)   Exercise Vital Sign    Days of Exercise per Week: 2 days    Minutes of Exercise per Session: 10 min  Stress: Stress Concern Present (08/06/2020)   [place]    Feeling of Stress : To some extent  Social Connections: Socially Isolated (08/06/2020)   Social Connection and Isolation Panel [NHANES]    Frequency of Communication with Friends and Family: More than three times a week    Frequency of Social Gatherings with Friends and Family: Three times a week    Attends Religious Services: Never    Active Member of Clubs or Organizations: No    Attends Archivist Meetings: Never    Marital Status: Divorced  Human resources officer Violence: Not At Risk (08/06/2020)   Humiliation, Afraid, Rape, and Kick questionnaire    Fear of Current or Ex-Partner:  No    Emotionally Abused: No    Physically Abused: No    Sexually Abused: No    Outpatient Medications Prior to Visit  Medication Sig Dispense Refill   ALPRAZolam (XANAX) 1 MG tablet Take 1 mg by mouth 3 (three) times daily as needed for anxiety.      amantadine (SYMMETREL) 100 MG capsule Take 100 mg by mouth at bedtime.     amLODipine (NORVASC) 5 MG tablet Take 1 tablet (5 mg total) by mouth in the morning and at bedtime. 60 tablet 2   ARIPiprazole (ABILIFY) 5 MG tablet Take 5 mg by mouth at bedtime.     carvedilol (COREG) 25 MG tablet Take 25 mg by mouth 2 (two) times daily.     chlorthalidone (HYGROTON)  25 MG tablet Take 12.5 mg by mouth daily.     Cholecalciferol (VITAMIN D3) 125 MCG (5000 UT) TABS Take 10,000 Units by mouth daily.     doxepin (SINEQUAN) 75 MG capsule Take 225 mg by mouth at bedtime.      lamoTRIgine (LAMICTAL) 200 MG tablet Take 200 mg by mouth 2 (two) times daily.     losartan (COZAAR) 25 MG tablet Take 25 mg by mouth in the morning.     medroxyPROGESTERone (DEPO-PROVERA) 150 MG/ML injection Inject 1 mL (150 mg total) into the muscle every 3 (three) months. 1 mL 3   mirabegron ER (MYRBETRIQ) 25 MG TB24 tablet Take 1 tablet (25 mg total) by mouth daily. 30 tablet 0   nitrofurantoin (MACRODANTIN) 50 MG capsule Take 1 capsule (50 mg total) by mouth at bedtime. 30 capsule 11   ondansetron (ZOFRAN) 4 MG tablet Take 1 tablet (4 mg total) by mouth daily as needed for nausea or vomiting. 30 tablet 1   oxyCODONE (ROXICODONE) 15 MG immediate release tablet Take 1 tablet (15 mg total) by mouth every 4 (four) hours as needed for pain. 30 tablet 0   oxyCODONE-acetaminophen (PERCOCET) 5-325 MG tablet Take 1 tablet by mouth every 4 (four) hours as needed. 30 tablet 0   potassium citrate (UROCIT-K) 5 MEQ (540 MG) SR tablet Take 10 mEq by mouth 3 (three) times daily.     QUEtiapine (SEROQUEL XR) 400 MG 24 hr tablet Take 400 mg by mouth at bedtime.      cloNIDine (CATAPRES) 0.1 MG  tablet Take 1 tablet (0.1 mg total) by mouth every evening. 30 tablet 3   rosuvastatin (CRESTOR) 10 MG tablet TAKE 1 TABLET BY MOUTH DAILY 90 tablet 1   No facility-administered medications prior to visit.    Allergies  Allergen Reactions   Morphine And Related Itching and Hives   Sulfa Antibiotics Swelling   Tape Itching and Other (See Comments)    Surgical     ROS Review of Systems  Constitutional:  Negative for chills and fever.  HENT:  Negative for congestion, sinus pressure, sinus pain and sore throat.   Eyes:  Negative for pain and discharge.  Respiratory:  Negative for cough and shortness of breath.   Cardiovascular:  Negative for chest pain and palpitations.  Gastrointestinal:  Negative for abdominal pain, diarrhea, nausea and vomiting.  Endocrine: Negative for polydipsia and polyuria.  Genitourinary:  Negative for dysuria and hematuria.  Musculoskeletal:  Negative for neck pain and neck stiffness.  Skin:  Negative for rash.  Neurological:  Positive for dizziness. Negative for weakness.  Psychiatric/Behavioral:  Negative for agitation and behavioral problems. The patient is nervous/anxious.       Objective:    Physical Exam Vitals reviewed.  Constitutional:      General: She is not in acute distress.    Appearance: She is not diaphoretic.  HENT:     Head: Normocephalic and atraumatic.     Nose: Nose normal.     Mouth/Throat:     Mouth: Mucous membranes are moist.  Eyes:     General: No scleral icterus.    Extraocular Movements: Extraocular movements intact.  Cardiovascular:     Rate and Rhythm: Normal rate and regular rhythm.     Pulses: Normal pulses.     Heart sounds: Normal heart sounds. No murmur heard. Pulmonary:     Breath sounds: Normal breath sounds. No wheezing or rales.  Musculoskeletal:     Cervical back:  Neck supple. No tenderness.     Right lower leg: No edema.     Left lower leg: No edema.  Skin:    General: Skin is warm.     Findings:  No rash.  Neurological:     General: No focal deficit present.     Mental Status: She is alert and oriented to person, place, and time.     Sensory: No sensory deficit.     Motor: Weakness (B/l LE - 4/5) present.  Psychiatric:        Mood and Affect: Mood normal.        Behavior: Behavior normal.     BP 134/80 (BP Location: Right Arm, Patient Position: Sitting, Cuff Size: Normal)   Pulse 86   Resp 18   Ht '5\' 3"'$  (1.6 m)   Wt 202 lb 6.4 oz (91.8 kg)   SpO2 98%   BMI 35.85 kg/m  Wt Readings from Last 3 Encounters:  04/04/22 202 lb 6.4 oz (91.8 kg)  02/12/22 213 lb 9.6 oz (96.9 kg)  01/27/22 213 lb 9.6 oz (96.9 kg)    Lab Results  Component Value Date   TSH 4.61 (H) 07/04/2020   Lab Results  Component Value Date   WBC 7.2 10/30/2020   HGB 12.6 10/30/2020   HCT 39.8 10/30/2020   MCV 89.4 10/30/2020   PLT 410 (H) 10/30/2020   Lab Results  Component Value Date   NA 138 08/31/2021   K 3.4 (L) 08/31/2021   CO2 24 08/31/2021   GLUCOSE 99 08/31/2021   BUN 23 (H) 08/31/2021   CREATININE 1.52 (H) 08/31/2021   BILITOT 0.3 10/30/2020   ALKPHOS 169 (H) 10/30/2020   AST 18 10/30/2020   ALT 28 10/30/2020   PROT 7.5 10/30/2020   ALBUMIN 3.9 08/31/2021   CALCIUM 9.1 08/31/2021   ANIONGAP 11 08/31/2021   Lab Results  Component Value Date   CHOL 244 (H) 09/04/2021   Lab Results  Component Value Date   HDL 44 (L) 09/04/2021   Lab Results  Component Value Date   LDLCALC 161 (H) 09/04/2021   Lab Results  Component Value Date   TRIG 218 (H) 09/04/2021   Lab Results  Component Value Date   CHOLHDL 5.5 (H) 09/04/2021   Lab Results  Component Value Date   HGBA1C 5.2 09/04/2021      Assessment & Plan:   Problem List Items Addressed This Visit       Cardiovascular and Mediastinum   Essential hypertension, benign - Primary    BP Readings from Last 1 Encounters:  04/04/22 134/80  Well-controlled with amlodipine, Losartan, Coreg, clonidine and  chlorthalidone Followed by Nephrology Episodes of dizziness could be due to dehydration and orthostatic hypotension - decreased dose of clonidine to 0.1 mg nightly only in the last visit, dizziness improved - discontinue clonidine now Needs to improve hydration Counseled for compliance with the medications Advised DASH diet and moderate exercise/walking, at least 150 mins/week      Relevant Medications   atorvastatin (LIPITOR) 20 MG tablet     Endocrine   Subclinical hypothyroidism    Chart review suggests mildly elevated TSH only once in the past She was on NP thyroid in the past, checked TSH and free T4 - wnl        Genitourinary   CKD (chronic kidney disease) stage 3, GFR 30-59 ml/min (HCC)    Likely due to uncontrolled HTN in the past Followed by Nephrology - last visit  note reviewed On Losartan and Chlorthalidone Avoid nephrotoxic agents        Other   Mixed hyperlipidemia    Switched from Crestor to Lipitor due to CKD Lipid profile reviewed      Relevant Medications   atorvastatin (LIPITOR) 20 MG tablet   Recurrent falls    Her episodes of fall or near fall were thought to be from vasovagal syncope in the setting of dehydration and/or orthostatic hypotension due to diuretics and clonidine Tapered dose of clonidine and discontinued today Advised to maintain at least 64 ounces of fluid intake in a day  Since she reports episodes of LOC before falls, would prefer neurology evaluation for possible seizures as she is on multiple psychiatric medicines that can cause/provoke seizures -on amantadine, Abilify, Lamictal, doxepin and Xanax  Check US carotid      Relevant Orders   Ambulatory referral to Neurology   US Carotid Duplex Bilateral   Other Visit Diagnoses     Screening mammogram for breast cancer       Relevant Orders   MM 3D SCREEN BREAST BILATERAL       Meds ordered this encounter  Medications   atorvastatin (LIPITOR) 20 MG tablet    Sig: Take 1  tablet (20 mg total) by mouth daily.    Dispense:  90 tablet    Refill:  3    Follow-up: Return in about 4 months (around 08/03/2022) for HTN and falls.    Lindell Spar, MD

## 2022-04-04 NOTE — Assessment & Plan Note (Signed)
Her episodes of fall or near fall were thought to be from vasovagal syncope in the setting of dehydration and/or orthostatic hypotension due to diuretics and clonidine Tapered dose of clonidine and discontinued today Advised to maintain at least 64 ounces of fluid intake in a day  Since she reports episodes of LOC before falls, would prefer neurology evaluation for possible seizures as she is on multiple psychiatric medicines that can cause/provoke seizures -on amantadine, Abilify, Lamictal, doxepin and Xanax  Check US carotid

## 2022-04-04 NOTE — Assessment & Plan Note (Signed)
BP Readings from Last 1 Encounters:  04/04/22 134/80   Well-controlled with amlodipine, Losartan, Coreg, clonidine and chlorthalidone Followed by Nephrology Episodes of dizziness could be due to dehydration and orthostatic hypotension - decreased dose of clonidine to 0.1 mg nightly only in the last visit, dizziness improved - discontinue clonidine now Needs to improve hydration Counseled for compliance with the medications Advised DASH diet and moderate exercise/walking, at least 150 mins/week

## 2022-04-04 NOTE — Patient Instructions (Signed)
Please stop taking Clonidine.  Please start taking Atorvastatin instead of Crestor.  Please continue to follow heart healthy diet and ambulate as tolerated.

## 2022-04-04 NOTE — Assessment & Plan Note (Signed)
Switched from Crestor to Lipitor due to CKD Lipid profile reviewed

## 2022-04-07 ENCOUNTER — Ambulatory Visit (HOSPITAL_COMMUNITY)
Admission: RE | Admit: 2022-04-07 | Discharge: 2022-04-07 | Disposition: A | Discharge status: Home / Self Care | Payer: Medicare HMO | Source: Ambulatory Visit | Attending: Internal Medicine | Admitting: Internal Medicine

## 2022-04-07 DIAGNOSIS — R296 Repeated falls: Secondary | ICD-10-CM | POA: Insufficient documentation

## 2022-04-07 DIAGNOSIS — F4011 Social phobia, generalized: Secondary | ICD-10-CM | POA: Diagnosis not present

## 2022-04-07 DIAGNOSIS — F3132 Bipolar disorder, current episode depressed, moderate: Secondary | ICD-10-CM | POA: Diagnosis not present

## 2022-04-07 DIAGNOSIS — R42 Dizziness and giddiness: Secondary | ICD-10-CM | POA: Diagnosis not present

## 2022-04-08 ENCOUNTER — Ambulatory Visit: Payer: Medicare HMO | Admitting: Urology

## 2022-04-08 DIAGNOSIS — N1832 Chronic kidney disease, stage 3b: Secondary | ICD-10-CM | POA: Diagnosis not present

## 2022-04-08 DIAGNOSIS — I129 Hypertensive chronic kidney disease with stage 1 through stage 4 chronic kidney disease, or unspecified chronic kidney disease: Secondary | ICD-10-CM | POA: Diagnosis not present

## 2022-04-08 DIAGNOSIS — N178 Other acute kidney failure: Secondary | ICD-10-CM | POA: Diagnosis not present

## 2022-04-08 DIAGNOSIS — R82991 Hypocitraturia: Secondary | ICD-10-CM | POA: Diagnosis not present

## 2022-04-08 DIAGNOSIS — N200 Calculus of kidney: Secondary | ICD-10-CM | POA: Diagnosis not present

## 2022-04-08 DIAGNOSIS — R809 Proteinuria, unspecified: Secondary | ICD-10-CM | POA: Diagnosis not present

## 2022-04-08 DIAGNOSIS — Z6839 Body mass index (BMI) 39.0-39.9, adult: Secondary | ICD-10-CM | POA: Diagnosis not present

## 2022-04-08 DIAGNOSIS — E876 Hypokalemia: Secondary | ICD-10-CM | POA: Diagnosis not present

## 2022-04-10 ENCOUNTER — Encounter: Payer: Self-pay | Admitting: Neurology

## 2022-04-27 ENCOUNTER — Other Ambulatory Visit: Payer: Self-pay | Admitting: Urology

## 2022-04-30 ENCOUNTER — Ambulatory Visit: Payer: Self-pay | Admitting: *Deleted

## 2022-04-30 NOTE — Patient Outreach (Signed)
  Care Coordination   Follow Up Visit Note   04/30/2022 Name: [name] MRN: [id number] DOB: December 05, 1970  [name] is a 51 y.o. year old female who sees Lindell Spar, MD for primary care. I spoke with  [name] by phone today.  What matters to the patients health and wellness today?  Still with some Dizziness Stopped clonidine after seeing cardiologist but is not noticing a benefit related to her dizziness which still occurs at intervals depending on what she is doing    Recent new Left Thigh numbness, feels like it is "on fire" Left arm/shoulder & hip pain  Reports pain level at a 10 & she reports she has not used anything for pain relief. States she no longer takes oxycodone  She does use Lamictal as ordered   Symptoms started during her recent 10 day vacation     Goals Addressed               This Visit's Progress     Patient Stated     manage pain on left side (THN) (pt-stated)   Not on track     Care Coordination Interventions: Counseled on the importance of reporting any/all new or changed pain symptoms or management strategies to pain management provider Reviewed with patient prescribed pharmacological and nonpharmacological pain relief strategies Outreached to pcp RN , Velna Hatchet for advise/assist (secure chat) after spoke with Margreta Journey at neurology office to confirm patient unable to be assisted by Dr Delice Lesch until after her new patient visit on 05/14/22   Returned a call to patient after collaboration with PCP office manager/RN who requests patient call to office to schedule an appointment so she can have her left side symptoms evaluated. Patient confirms she will call the office         SDOH assessments and interventions completed:  Yes     Care Coordination Interventions:  Yes, provided   Follow up plan: Follow up call scheduled for 05/14/22    Encounter Outcome:  Pt. Visit Completed   Ricka Westra L. Lavina Hamman, RN, BSN, [place]  Coordinator Office number [phone]

## 2022-04-30 NOTE — Patient Instructions (Signed)
Visit Information  Thank you for taking time to visit with me today. Please don't hesitate to contact me if I can be of assistance to you.   Following are the goals we discussed today:   Goals Addressed               This Visit's Progress     Patient Stated     manage pain on left side (THN) (pt-stated)   Not on track     Care Coordination Interventions: Counseled on the importance of reporting any/all new or changed pain symptoms or management strategies to pain management provider Reviewed with patient prescribed pharmacological and nonpharmacological pain relief strategies Outreached to pcp RN , Velna Hatchet for advise/assist (secure chat) after spoke with Margreta Journey at neurology office to confirm patient unable to be assisted by Dr Delice Lesch until after her new patient visit on 05/14/22   Returned a call to patient after collaboration with PCP office manager/RN who requests patient call to office to schedule an appointment so she can have her left side symptoms evaluated. Patient confirms she will call the office         Our next appointment is by telephone on 05/14/22 at 1:30 pm  Please call the care guide team at [phone] if you need to cancel or reschedule your appointment.   If you are experiencing a Mental Health or [place] or need someone to talk to, please call the Suicide and Crisis Lifeline: 988 call the [place] National Suicide Prevention Lifeline: 217-484-2176 or TTY: 727-814-9274 TTY 507 711 9331) to talk to a trained counselor call 1-800-273-TALK (toll free, 24 hour hotline) call the [facility]: ([phone])326-0698 call 911   Patient verbalizes understanding of instructions and care plan provided today and agrees to view in [place]. Active MyChart status and patient understanding of how to access instructions and care plan via MyChart confirmed with patient.     The patient has been provided with contact information for the care management  team and has been advised to call with any health related questions or concerns.   Jodie Leiner L. Lavina Hamman, RN, BSN, [place] Coordinator Office number [phone]

## 2022-05-02 ENCOUNTER — Ambulatory Visit: Payer: Medicare HMO | Admitting: Internal Medicine

## 2022-05-05 ENCOUNTER — Other Ambulatory Visit (HOSPITAL_COMMUNITY): Payer: Medicare HMO

## 2022-05-05 ENCOUNTER — Ambulatory Visit (HOSPITAL_COMMUNITY): Payer: Medicare HMO

## 2022-05-05 ENCOUNTER — Encounter (HOSPITAL_COMMUNITY): Payer: Self-pay

## 2022-05-12 ENCOUNTER — Ambulatory Visit: Payer: Medicare HMO | Admitting: Urology

## 2022-05-14 ENCOUNTER — Encounter: Payer: Self-pay | Admitting: Neurology

## 2022-05-14 ENCOUNTER — Ambulatory Visit: Payer: Self-pay | Admitting: *Deleted

## 2022-05-14 ENCOUNTER — Ambulatory Visit: Payer: Medicare HMO | Admitting: Neurology

## 2022-05-14 VITALS — BP 120/77 | HR 90 | Ht 63.0 in | Wt 201.8 lb

## 2022-05-14 DIAGNOSIS — R404 Transient alteration of awareness: Secondary | ICD-10-CM | POA: Diagnosis not present

## 2022-05-14 DIAGNOSIS — R296 Repeated falls: Secondary | ICD-10-CM

## 2022-05-14 DIAGNOSIS — R413 Other amnesia: Secondary | ICD-10-CM

## 2022-05-14 DIAGNOSIS — G5712 Meralgia paresthetica, left lower limb: Secondary | ICD-10-CM

## 2022-05-14 DIAGNOSIS — R292 Abnormal reflex: Secondary | ICD-10-CM | POA: Diagnosis not present

## 2022-05-14 NOTE — Progress Notes (Signed)
NEUROLOGY CONSULTATION NOTE  [name] MRN: [id number] DOB: [birth year]  Referring provider: Dr. Ihor Dow Primary care provider: Dr. Ihor Dow  Reason for consult:  episodes of LOC before falls, evaluation for possible seizures  Dear Dr Posey Pronto:  Thank you for your kind referral of [place] for consultation of the above symptoms. Although her history is well known to you, please allow me to reiterate it for the purpose of our medical record. The patient was accompanied to the clinic by her sister Butch Penny who also provides collateral information. Records and images were personally reviewed where available.   HISTORY OF PRESENT ILLNESS: This is a 51 year old right-handed man with a history of hypertension, hypothyroidism, nephrolithiasis, CKD, anxiety, depression, presenting for frequent falls. She states she does not remember what occurs before or after a fall. Symptoms started 6-8 months ago, last fall was 2 weeks ago. She lives with her father. Her father mentioned she got up one night to use the bathroom and fell, she was awake but not alert. She does not recall going to the bathroom or him helping her up. Another time 3-4 months ago, she was out of it and slurring her speech. She had fallen several times, getting up to use the bathroom and fall, "awake but not really awake," occurring in the daytime as well. She was initially falling 4 times in a week so Butch Penny stayed with her for 2 weeks, then all of a sudden, she was back to her old self. Symptoms then recurrent for 1-2 weeks where Butch Penny and their aunt stayed to care for her. Speech was slurred so bad, she could not understand people around her but Butch Penny reports she would follow instructions. One time she had urinary incontinence. Butch Penny notes she gets really irritable during those periods then would not remember what she said. She reports her memory is bad in general. Over the past year, she denies getting lost driving but would  get disoriented for a minute then realize where she is. She denies missing medications or bill payments but forgets to make appointments. She reports it takes a lot of effort to do what she needs to, such as paperwork. Her handwriting has also changed, it starts good then goes off the line.   They deny any staring/unresponsive episodes. She denies any olfactory/gustatory hallucinations. She has numbness and burning on the left lateral thigh. It hurts to lift her left arm, no numbness/tingling. Her left hip also bothers her. She states the pain is not from prior falls. Her feet feel cold but they are warm when touched. She has brief body twitches and developed tics which Ingrezza sometimes helps with. She denies any headaches, neck/back pain, dysarthria/dysphagia. She has a lot of dizziness. She was having a little double vision during the months she had the episodes. She has some constipation but also has times she cannot hold her BM and has incontinence. Butch Penny would help her get cleaned up but she would not recall how she got cleaned. She usually gets 5 hours of sleep and denies any change in sleep pattern or alcohol intake during the episodes. Mood is okay, she gets anxious and cold. She takes Xanax every night with no missed doses. She is also on Lamotrigine '200mg'$  BID for mood. No family history of similar symptoms. She had a normal birth and early development.  There is no history of febrile convulsions, CNS infections such as meningitis/encephalitis, significant traumatic brain injury, neurosurgical procedures, or family history  of seizures.   PAST MEDICAL HISTORY: Past Medical History:  Diagnosis Date   Anxiety    Chronic kidney disease    CKD (chronic kidney disease) stage 3, GFR 30-59 ml/min (HCC) 03/15/2019   Depression    Diarrhea 05/04/2017   Essential hypertension, benign 03/15/2019   Hypertension    Hypothyroidism    Hypothyroidism, adult 03/15/2019   Kidney stones    LGSIL on Pap smear  of cervix 08/17/2020   Needs colpo per [facility] immediate risk is 4.3 for CIN 3+risk    Obesity (BMI 30-39.9) 03/15/2019   Renal disorder    Vitamin D deficiency disease 03/15/2019    PAST SURGICAL HISTORY: Past Surgical History:  Procedure Laterality Date   BIOPSY  06/05/2017   Procedure: BIOPSY;  Surgeon: Rogene Houston, MD;  [facility]: [facility];  Service: Endoscopy;;  colon   CHOLECYSTECTOMY     COLONOSCOPY N/A 06/05/2017   Procedure: COLONOSCOPY;  Surgeon: Rogene Houston, MD;  Location: [facility];  Service: Endoscopy;  Laterality: N/A;  8:30   CYSTOSCOPY WITH RETROGRADE PYELOGRAM, URETEROSCOPY AND STENT PLACEMENT Left 10/04/2020   Procedure: CYSTOSCOPY WITH LEFT RETROGRADE PYELOGRAM, DIAGNOSTIC LEFT URETEROSCOPY AND LEFT URETEREAL STENT PLACEMENT;  Surgeon: Cleon Gustin, MD;  Location: [facility];  Service: Urology;  Laterality: Left;   CYSTOSCOPY WITH RETROGRADE PYELOGRAM, URETEROSCOPY AND STENT PLACEMENT Left 10/16/2020   Procedure: CYSTOSCOPY WITH LEFT RETROGRADE PYELOGRAM, LEFT URETEROSCOPY  WITH LASER AND LEFT URETERAL STENT EXCHANGE;  Surgeon: Cleon Gustin, MD;  Location: [facility];  Service: Urology;  Laterality: Left;   CYSTOSCOPY WITH RETROGRADE PYELOGRAM, URETEROSCOPY AND STENT PLACEMENT Bilateral 02/17/2022   Procedure: CYSTOSCOPY WITH RETROGRADE PYELOGRAM, URETEROSCOPY AND STENT PLACEMENT;  Surgeon: Cleon Gustin, MD;  Location: [facility];  Service: Urology;  Laterality: Bilateral;   HOLMIUM LASER APPLICATION Left 5/73/2202   Procedure: HOLMIUM LASER APPLICATION;  Surgeon: Cleon Gustin, MD;  Location: [facility];  Service: Urology;  Laterality: Left;   KIDNEY STONE SURGERY     kidney stones     LASER ABLATION CONDOLAMATA N/A 10/31/2020   Procedure: LASER ABLATION OF THE CERVIX;  Surgeon: Florian Buff, MD;  Location: [facility];  Service: Gynecology;  Laterality: N/A;    MEDICATIONS: Current Outpatient Medications on File Prior to Visit  Medication Sig Dispense  Refill   ALPRAZolam (XANAX) 1 MG tablet Take 1 mg by mouth 3 (three) times daily as needed for anxiety.      amLODipine (NORVASC) 5 MG tablet Take 1 tablet (5 mg total) by mouth in the morning and at bedtime. 60 tablet 2   ARIPiprazole (ABILIFY) 5 MG tablet Take 5 mg by mouth at bedtime.     atorvastatin (LIPITOR) 20 MG tablet Take 1 tablet (20 mg total) by mouth daily. 90 tablet 3   carvedilol (COREG) 25 MG tablet Take 25 mg by mouth 2 (two) times daily.     chlorthalidone (HYGROTON) 25 MG tablet Take 12.5 mg by mouth daily.     Cholecalciferol (VITAMIN D3) 125 MCG (5000 UT) TABS Take 10,000 Units by mouth daily.     doxepin (SINEQUAN) 75 MG capsule Take 225 mg by mouth at bedtime.      INGREZZA 60 MG capsule Take 60 mg by mouth daily.     lamoTRIgine (LAMICTAL) 200 MG tablet Take 200 mg by mouth 2 (two) times daily.     losartan (COZAAR) 25 MG tablet Take 25 mg by mouth in the morning.  medroxyPROGESTERone (DEPO-PROVERA) 150 MG/ML injection Inject 1 mL (150 mg total) into the muscle every 3 (three) months. 1 mL 3   nitrofurantoin (MACRODANTIN) 50 MG capsule Take 1 capsule (50 mg total) by mouth at bedtime. 30 capsule 11   potassium citrate (UROCIT-K) 5 MEQ (540 MG) SR tablet Take 10 mEq by mouth 3 (three) times daily.     QUEtiapine (SEROQUEL XR) 400 MG 24 hr tablet Take 400 mg by mouth at bedtime.      No current facility-administered medications on file prior to visit.    ALLERGIES: Allergies  Allergen Reactions   Morphine And Related Itching and Hives   Sulfa Antibiotics Swelling   Tape Itching and Other (See Comments)    Surgical     FAMILY HISTORY: Family History  Problem Relation Age of Onset   Cancer Paternal Grandmother    Heart failure Paternal Grandmother    Cancer Maternal Grandmother    Cancer Father    Heart failure Father    Lung cancer Mother    Colon cancer Neg Hx     SOCIAL HISTORY: Social History   Socioeconomic History   Marital status: Divorced     Spouse name: Not on file   Number of children: Not on file   Years of education: Not on file   Highest education level: Not on file  Occupational History   Not on file  Tobacco Use   Smoking status: Former    Packs/day: 0.50    Years: 20.00    Total pack years: 10.00    Types: Cigarettes    Quit date: 02/08/2022    Years since quitting: 0.2   Smokeless tobacco: Never  Vaping Use   Vaping Use: Never used  Substance and Sexual Activity   Alcohol use: No   Drug use: No   Sexual activity: Not Currently    Birth control/protection: Injection  Other Topics Concern   Not on file  Social History Narrative   Divorced twice,1st lasted 5 years,2nd 4 years.On disabilty secondary to mental illness since 2004.Previously used to [employer]Lives alone,has 2 cats.cright handed    Social Determinants of Health   Financial Resource Strain: Medium Risk (08/06/2020)   Overall Financial Resource Strain (CARDIA)    Difficulty of Paying Living Expenses: Somewhat hard  Food Insecurity: No Food Insecurity (03/27/2022)   Hunger Vital Sign    Worried About Running Out of Food in the Last Year: Never true    Ran Out of Food in the Last Year: Never true  Transportation Needs: No Transportation Needs (03/27/2022)   PRAPARE - Hydrologist (Medical): No    Lack of Transportation (Non-Medical): No  Physical Activity: Insufficiently Active (08/06/2020)   Exercise Vital Sign    Days of Exercise per Week: 2 days    Minutes of Exercise per Session: 10 min  Stress: Stress Concern Present (08/06/2020)   [place]    Feeling of Stress : To some extent  Social Connections: Socially Isolated (08/06/2020)   Social Connection and Isolation Panel [NHANES]    Frequency of Communication with Friends and Family: More than three times a week    Frequency of Social Gatherings with Friends and Family: Three times a week     Attends Religious Services: Never    Active Member of Clubs or Organizations: No    Attends Archivist Meetings: Never    Marital Status: Divorced  Intimate Partner Violence: Not At Risk (08/06/2020)   Humiliation, Afraid, Rape, and Kick questionnaire    Fear of Current or Ex-Partner: No    Emotionally Abused: No    Physically Abused: No    Sexually Abused: No     PHYSICAL EXAM: Vitals:   05/14/22 0838  BP: 120/77  Pulse: 90  SpO2: 97%   Orthostatic vital signs Prone BP 143/78, HR 91 Sitting BP 130/75, HR 92 Standing BP 110/60, HR 92  General: No acute distress Head:  Normocephalic/atraumatic Skin/Extremities: No rash, no edema Neurological Exam: Mental status: alert and oriented to person, place, and time, no dysarthria or aphasia, Fund of knowledge is appropriate.  Recent and remote memory are intact.  Attention and concentration are normal.    Able to name objects and repeat phrases. MMSE 29/30    05/18/2022    9:00 PM  MMSE - Mini Mental State Exam  Orientation to time 4  Orientation to Place 5  Registration 3  Attention/ Calculation 5  Recall 3  Language- name 2 objects 2  Language- repeat 1  Language- follow 3 step command 3  Language- read & follow direction 1  Write a sentence 1  Copy design 1  Total score 29    Cranial nerves: CN I: not tested CN II: pupils equal, round, visual fields intact CN III, IV, VI:  full range of motion, no nystagmus, no ptosis CN V: facial sensation intact CN VII: upper and lower face symmetric CN VIII: hearing intact to conversation Bulk & Tone: normal, no fasciculations. Motor: 5/5 throughout with no pronator drift. Sensation: intact to light touch, cold, pin on both UE, decreased pin and cold in the distribution of the left lateral femoral cutaneous nerve. Intact vibration sense.  No extinction to double simultaneous stimulation.  Romberg test negative Deep Tendon Reflexes: brisk +3 throughout with  +hyperactive pectoral reflexes, Hoffman sign L>R, no ankle clonus, +Hoffman sign L>R Plantar responses: downgoing bilaterally Cerebellar: no incoordination on finger to nose Gait: narrow-based and steady, able to tandem walk adequately. Tremor: she is tremulous, no clear postural and endpoint tremor seen   IMPRESSION: This is a 51 year old right-handed man with a history of hypertension, hypothyroidism, nephrolithiasis, CKD, anxiety, depression, presenting for frequent falls. She is amnestic of events before and after the falls. She is also endorsing memory loss with episodic symptoms where family needs to stay for prolonged periods. Etiology of symptoms unclear, MRI brain with and without contrast and EEG will be ordered. If normal, a 24-hour EEG will be done to further characterize her symptoms. She is noted to be hyperreflexic with report of left-sided symptoms, although left thigh numbness/burning is suggestive of meralgia paresthetica. MRI cervical spine without contrast will be ordered to assess for myelopathy causing frequent falls.  [place] driving laws were discussed with the patient, and she knows to stop driving after an episode of loss of awareness until 6 months event-free. Follow-up after tests, call for any changes.   Thank you for allowing me to participate in the care of this patient. Please do not hesitate to call for any questions or concerns.   Ellouise Newer, M.D.  CC: Dr. Posey Pronto

## 2022-05-14 NOTE — Patient Instructions (Signed)
Good to meet you.  Schedule MRI brain with and without contrast  2. Schedule MRI cervical spine without contrast  3. Schedule EEG. If normal, we will plan for a 24-hour EEG  4. As per [place] driving laws, no driving after an episode of loss of consciousness until 6 months event-free  5. Follow-up after tests, call for any changes

## 2022-05-14 NOTE — Patient Outreach (Signed)
  Care Coordination   Follow Up Visit Note   07/09/2022 late entry for 05/14/22 Name: AVRY MONTELEONE MRN: [id number] DOB: 16-Jan-1971  Luellen Pucker Schnoor is a 51 y.o. year old female who sees Lindell Spar, MD for primary care. I spoke with  Luellen Pucker Salzman by phone today.  What matters to the patients health and wellness today?  Dizziness- went to neuro today pending mri and EEG  Poor sleep lately  wake up in the middle of the night feeling nervous/agitated - naps through out the day  Fell since 04/30/22 She could not recall the fall ( no quick moves check BP <110  agitated after the fall  Left thigh numb- nerve compressed  Sister went with her to her appointment   Do not drive by self  She states she dehydrate     Goals Addressed               This Visit's Progress     Patient Stated     Falls [facility]) (pt-stated)   Not on track     Care Coordination Interventions: Provided written and verbal education re: potential causes of falls and Fall prevention strategies Advised patient of importance of notifying provider of falls Assessed for signs and symptoms of orthostatic hypotension Assessed for falls since last encounter Reviewed to get up slowly and pump feet when getting up.  Encouraged to drink adequate amounts of fluid to help with hypotension      improve sleep ([facility]) (pt-stated)   Not on track     Care Coordination Interventions: Evaluation of current treatment plan related to poor sleep and patient's adherence to plan as established by provider Provided education to patient re: insomnia, risks to health, CPAP use Discussed plans with patient for ongoing care management follow up and provided patient with direct contact information for care management team        SDOH assessments and interventions completed:  No     Care Coordination Interventions:  Yes, provided   Follow up plan: Follow up call scheduled for 06/17/22    Encounter Outcome:  Pt. Visit Completed    Nhu Glasby L. Lavina Hamman, RN, BSN, [place] Coordinator Office number [phone]

## 2022-05-15 ENCOUNTER — Telehealth: Payer: Self-pay

## 2022-05-15 NOTE — Telephone Encounter (Signed)
Pt called with appointment time from her MRI of the brain and cervical spine. Its to be done at [facility] January the 8th she needs to be there at 2:30 the test will be done at 3pm and 4 pm

## 2022-05-15 NOTE — Telephone Encounter (Signed)
Pt PA number with Mcarthur Rossetti is [phone] it is good from 05/15/22 until 06/15/22

## 2022-05-20 ENCOUNTER — Ambulatory Visit: Payer: Medicare HMO | Admitting: Neurology

## 2022-05-20 DIAGNOSIS — R296 Repeated falls: Secondary | ICD-10-CM

## 2022-05-20 DIAGNOSIS — R413 Other amnesia: Secondary | ICD-10-CM | POA: Diagnosis not present

## 2022-05-20 DIAGNOSIS — R404 Transient alteration of awareness: Secondary | ICD-10-CM | POA: Diagnosis not present

## 2022-05-20 NOTE — Progress Notes (Signed)
EEG complete - results pending 

## 2022-05-21 ENCOUNTER — Encounter (INDEPENDENT_AMBULATORY_CARE_PROVIDER_SITE_OTHER): Payer: Self-pay | Admitting: *Deleted

## 2022-05-21 ENCOUNTER — Ambulatory Visit (HOSPITAL_COMMUNITY)
Admission: RE | Admit: 2022-05-21 | Discharge: 2022-05-21 | Disposition: A | Discharge status: Home / Self Care | Payer: Medicare HMO | Source: Ambulatory Visit | Attending: Urology | Admitting: Urology

## 2022-05-21 DIAGNOSIS — N200 Calculus of kidney: Secondary | ICD-10-CM | POA: Diagnosis not present

## 2022-05-26 NOTE — Procedures (Signed)
ELECTROENCEPHALOGRAM REPORT  Date of Study: 05/20/2022  Patient's Name: [name] MRN: [id number] Date of Birth: Oct 06, 1970  Referring Provider: Dr. Ellouise Newer  Clinical History: This is a 51 year old woman with episodes of confusion/memory loss and frequent falls. EEG for classification.  Medications: Lamictal, Abilify, Ingrezza, Seroquel  Technical Summary: A multichannel digital 1-hour EEG recording measured by the international 10-20 system with electrodes applied with paste and impedances below 5000 ohms performed in our laboratory with EKG monitoring in an awake and asleep patient.  Hyperventilation and photic stimulation were performed.  The digital EEG was referentially recorded, reformatted, and digitally filtered in a variety of bipolar and referential montages for optimal display.    Description: The patient is awake and asleep during the recording.  During maximal wakefulness, there is a symmetric, medium voltage 9-10 Hz posterior dominant rhythm that attenuates with eye opening.  The record is symmetric.  During drowsiness and stage 1 sleep, there is an increase in theta slowing of the background and vertex waves seen. Hyperventilation and photic stimulation did not elicit any abnormalities.  There were no epileptiform discharges or electrographic seizures seen.    EKG lead was unremarkable.  Impression: This 1-hour awake and asleep EEG is normal.    Clinical Correlation: A normal EEG does not exclude a clinical diagnosis of epilepsy.  If further clinical questions remain, prolonged EEG may be helpful.  Clinical correlation is advised.   Ellouise Newer, M.D.

## 2022-05-27 ENCOUNTER — Telehealth: Payer: Self-pay

## 2022-05-27 DIAGNOSIS — R404 Transient alteration of awareness: Secondary | ICD-10-CM

## 2022-05-27 NOTE — Telephone Encounter (Signed)
Pt called an informed that EEG was normal. Proceed with MRI as scheduled. Pt would like to do the 24 hour EEG

## 2022-05-27 NOTE — Telephone Encounter (Signed)
-----   Message from Cameron Sprang, MD sent at 05/27/2022 12:39 PM EST ----- Pls let her know the EEG was normal. Proceed with MRI as scheduled. We had discussed doing a 24-hour EEG if the routine office EEG was normal. If she would like to proceed, pls order 24-hour EEG, thanks

## 2022-05-29 ENCOUNTER — Telehealth: Payer: Self-pay

## 2022-05-29 DIAGNOSIS — E876 Hypokalemia: Secondary | ICD-10-CM | POA: Diagnosis not present

## 2022-05-29 DIAGNOSIS — N1832 Chronic kidney disease, stage 3b: Secondary | ICD-10-CM | POA: Diagnosis not present

## 2022-05-29 DIAGNOSIS — Z6836 Body mass index (BMI) 36.0-36.9, adult: Secondary | ICD-10-CM | POA: Diagnosis not present

## 2022-05-29 DIAGNOSIS — N200 Calculus of kidney: Secondary | ICD-10-CM | POA: Diagnosis not present

## 2022-05-29 DIAGNOSIS — R809 Proteinuria, unspecified: Secondary | ICD-10-CM | POA: Diagnosis not present

## 2022-05-29 DIAGNOSIS — R82991 Hypocitraturia: Secondary | ICD-10-CM | POA: Diagnosis not present

## 2022-05-29 DIAGNOSIS — I129 Hypertensive chronic kidney disease with stage 1 through stage 4 chronic kidney disease, or unspecified chronic kidney disease: Secondary | ICD-10-CM | POA: Diagnosis not present

## 2022-05-29 NOTE — Telephone Encounter (Signed)
Patient called requesting her apt be moved up to a sooner date.  Returned her call and informed her Dr. Alyson Ingles is out of the office until 01/02.  Patient voiced understanding and will follow up as scheduled.

## 2022-06-03 ENCOUNTER — Ambulatory Visit: Payer: Medicare HMO | Admitting: Urology

## 2022-06-03 VITALS — BP 135/82 | HR 93

## 2022-06-03 DIAGNOSIS — N3021 Other chronic cystitis with hematuria: Secondary | ICD-10-CM

## 2022-06-03 DIAGNOSIS — N200 Calculus of kidney: Secondary | ICD-10-CM | POA: Diagnosis not present

## 2022-06-03 LAB — URINALYSIS, ROUTINE W REFLEX MICROSCOPIC
Bilirubin, UA: NEGATIVE
Glucose, UA: NEGATIVE
Ketones, UA: NEGATIVE
Nitrite, UA: POSITIVE — AB
RBC, UA: NEGATIVE
Specific Gravity, UA: 1.015 (ref 1.005–1.030)
Urobilinogen, Ur: 0.2 mg/dL (ref 0.2–1.0)
pH, UA: 7.5 (ref 5.0–7.5)

## 2022-06-03 LAB — MICROSCOPIC EXAMINATION: Bacteria, UA: NONE SEEN

## 2022-06-03 NOTE — H&P (View-Only) (Signed)
06/03/2022 3:03 PM   [name] 02-16-1971 [id number]  Referring provider: Lindell Spar, MD [address] [place],  [place] [address]  Followup frequent UTI and nephrolithiasis   HPI: [name] is a 52yo here for followup for nephrolithiasis and frequent UTIs. No recent UTIs. She is on macrodantin '50mg'$  qhs for prophylaxis. Renal US 12/20 shows a 35m left lower pole calculus. She has mild intermittent left flank pain. NO other significant LUTS. No hematuria or dysuria   PMH: Past Medical History:  Diagnosis Date   Anxiety    Chronic kidney disease    CKD (chronic kidney disease) stage 3, GFR 30-59 ml/min (HCC) 03/15/2019   Depression    Diarrhea 05/04/2017   Essential hypertension, benign 03/15/2019   Hypertension    Hypothyroidism    Hypothyroidism, adult 03/15/2019   Kidney stones    LGSIL on Pap smear of cervix 08/17/2020   Needs colpo per [facility] immediate risk is 4.3 for CIN 3+risk    Obesity (BMI 30-39.9) 03/15/2019   Renal disorder    Vitamin D deficiency disease 03/15/2019    Surgical History: Past Surgical History:  Procedure Laterality Date   BIOPSY  06/05/2017   Procedure: BIOPSY;  Surgeon: Sabrina Houston MD;  [facility]: [facility];  Service: Endoscopy;;  colon   CHOLECYSTECTOMY     COLONOSCOPY N/A 06/05/2017   Procedure: COLONOSCOPY;  Surgeon: Sabrina Houston MD;  Location: [facility];  Service: Endoscopy;  Laterality: N/A;  8:30   CYSTOSCOPY WITH RETROGRADE PYELOGRAM, URETEROSCOPY AND STENT PLACEMENT Left 10/04/2020   Procedure: CYSTOSCOPY WITH LEFT RETROGRADE PYELOGRAM, DIAGNOSTIC LEFT URETEROSCOPY AND LEFT URETEREAL STENT PLACEMENT;  Surgeon: Sabrina Gustin MD;  Location: [facility];  Service: Urology;  Laterality: Left;   CYSTOSCOPY WITH RETROGRADE PYELOGRAM, URETEROSCOPY AND STENT PLACEMENT Left 10/16/2020   Procedure: CYSTOSCOPY WITH LEFT RETROGRADE PYELOGRAM, LEFT URETEROSCOPY  WITH LASER AND LEFT URETERAL STENT EXCHANGE;  Surgeon: Sabrina Gustin MD;  Location: [facility];  Service: Urology;  Laterality: Left;   CYSTOSCOPY WITH RETROGRADE PYELOGRAM, URETEROSCOPY AND STENT PLACEMENT Bilateral 02/17/2022   Procedure: CYSTOSCOPY WITH RETROGRADE PYELOGRAM, URETEROSCOPY AND STENT PLACEMENT;  Surgeon: Sabrina Gustin MD;  Location: [facility];  Service: Urology;  Laterality: Bilateral;   HOLMIUM LASER APPLICATION Left 51/66/0630  Procedure: HOLMIUM LASER APPLICATION;  Surgeon: Sabrina Gustin MD;  Location: [facility];  Service: Urology;  Laterality: Left;   KIDNEY STONE SURGERY     kidney stones     LASER ABLATION CONDOLAMATA N/A 10/31/2020   Procedure: LASER ABLATION OF THE CERVIX;  Surgeon: Sabrina Buff MD;  Location: [facility];  Service: Gynecology;  Laterality: N/A;    Home Medications:  Allergies as of 06/03/2022       Reactions   Morphine And Related Itching, Hives   Sulfa Antibiotics Swelling   Tape Itching, Other (See Comments)   Surgical         Medication List        Accurate as of June 03, 2022  3:03 PM. If you have any questions, ask your nurse or doctor.          ALPRAZolam 1 MG tablet Commonly known as: XANAX Take 1 mg by mouth 3 (three) times daily as needed for anxiety.   amLODipine 5 MG tablet Commonly known as: NORVASC Take 1 tablet (5 mg total) by mouth in the morning and at bedtime.   ARIPiprazole 5 MG tablet Commonly known as:  ABILIFY Take 5 mg by mouth at bedtime.   atorvastatin 20 MG tablet Commonly known as: LIPITOR Take 1 tablet (20 mg total) by mouth daily.   carvedilol 25 MG tablet Commonly known as: COREG Take 25 mg by mouth 2 (two) times daily.   chlorthalidone 25 MG tablet Commonly known as: HYGROTON Take 12.5 mg by mouth daily.   doxepin 75 MG capsule Commonly known as: SINEQUAN Take 225 mg by mouth at bedtime.   Ingrezza 60 MG capsule Generic drug: valbenazine Take 60 mg by mouth daily.   lamoTRIgine 200 MG tablet Commonly known as: LAMICTAL Take 200 mg by  mouth 2 (two) times daily.   losartan 25 MG tablet Commonly known as: COZAAR Take 25 mg by mouth in the morning.   medroxyPROGESTERone 150 MG/ML injection Commonly known as: DEPO-PROVERA Inject 1 mL (150 mg total) into the muscle every 3 (three) months.   nitrofurantoin 50 MG capsule Commonly known as: MACRODANTIN Take 1 capsule (50 mg total) by mouth at bedtime.   potassium citrate 5 MEQ (540 MG) SR tablet Commonly known as: UROCIT-K Take 10 mEq by mouth 3 (three) times daily.   QUEtiapine 400 MG 24 hr tablet Commonly known as: SEROQUEL XR Take 400 mg by mouth at bedtime.   Vitamin D3 125 MCG (5000 UT) Tabs Take 10,000 Units by mouth daily.        Allergies:  Allergies  Allergen Reactions   Morphine And Related Itching and Hives   Sulfa Antibiotics Swelling   Tape Itching and Other (See Comments)    Surgical     Family History: Family History  Problem Relation Age of Onset   Cancer Paternal Grandmother    Heart failure Paternal Grandmother    Cancer Maternal Grandmother    Cancer Father    Heart failure Father    Lung cancer Mother    Colon cancer Neg Hx     Social History:  reports that she quit smoking about 3 months ago. Her smoking use included cigarettes. She has a 10.00 pack-year smoking history. She has never used smokeless tobacco. She reports that she does not drink alcohol and does not use drugs.  ROS: All other review of systems were reviewed and are negative except what is noted above in HPI  Physical Exam: BP 135/82   Pulse 93   Constitutional:  Alert and oriented, No acute distress. HEENT: [name] AT, moist mucus membranes.  Trachea midline, no masses. Cardiovascular: No clubbing, cyanosis, or edema. Respiratory: Normal respiratory effort, no increased work of breathing. GI: Abdomen is soft, nontender, nondistended, no abdominal masses GU: No CVA tenderness.  Lymph: No cervical or inguinal lymphadenopathy. Skin: No rashes, bruises or  suspicious lesions. Neurologic: Grossly intact, no focal deficits, moving all 4 extremities. Psychiatric: Normal mood and affect.  Laboratory Data: Lab Results  Component Value Date   WBC 7.2 10/30/2020   HGB 12.6 10/30/2020   HCT 39.8 10/30/2020   MCV 89.4 10/30/2020   PLT 410 (H) 10/30/2020    Lab Results  Component Value Date   CREATININE 1.52 (H) 08/31/2021    No results found for: "PSA"  No results found for: "TESTOSTERONE"  Lab Results  Component Value Date   HGBA1C 5.2 09/04/2021    Urinalysis    Component Value Date/Time   COLORURINE YELLOW 10/30/2020 1355   APPEARANCEUR Cloudy (A) 02/24/2022 0838   LABSPEC 1.025 10/30/2020 1355   PHURINE 5.0 10/30/2020 1355   GLUCOSEU Negative 02/24/2022 2952  HGBUR NEGATIVE 10/30/2020 1355   BILIRUBINUR Negative 02/24/2022 0838   KETONESUR NEGATIVE 10/30/2020 1355   PROTEINUR 3+ (A) 02/24/2022 0838   PROTEINUR 30 (A) 10/30/2020 1355   UROBILINOGEN 0.2 01/17/2009 0124   NITRITE Negative 02/24/2022 0838   NITRITE NEGATIVE 10/30/2020 1355   LEUKOCYTESUR 3+ (A) 02/24/2022 0838   LEUKOCYTESUR SMALL (A) 10/30/2020 1355    Lab Results  Component Value Date   LABMICR See below: 02/24/2022   WBCUA 11-30 (A) 02/24/2022   LABEPIT 0-10 02/24/2022   MUCUS Present 09/23/2021   BACTERIA Moderate (A) 02/24/2022    Pertinent Imaging: Renal US 05/21/2022: Images reviewed and discussed with the patient Results for orders placed during the hospital encounter of 08/28/20  Abdomen 1 view (KUB)  Narrative CLINICAL DATA:  Nephrolithiasis  EXAM: ABDOMEN - 1 VIEW  COMPARISON:  CT 08/21/2020  FINDINGS: The bowel gas pattern is normal. 8 mm stone again seen projecting over the left renal shadow. No right sided renal calculi, which is partially obscured by overlying bowel gas and stool. Moderate volume stool throughout the colon. No acute osseous findings. Other significant radiographic abnormality are seen.  IMPRESSION: 8  mm stone projecting over the left renal shadow, similar to prior exam.   Electronically Signed By: Davina Poke D.O. On: 08/29/2020 10:41  No results found for this or any previous visit.  No results found for this or any previous visit.  No results found for this or any previous visit.  Results for orders placed during the hospital encounter of 05/21/22  Ultrasound renal complete  Narrative CLINICAL DATA:  History of stones.  Follow-up.  EXAM: RENAL / URINARY TRACT ULTRASOUND COMPLETE  COMPARISON:  August 30, 2021  FINDINGS: Right Kidney:  Renal measurements: 10 x 5 x 4 cm = volume: 106 mL. Echogenicity within normal limits. No mass or hydronephrosis visualized.  Left Kidney:  Renal measurements: 7.6 x 4.1 x 3.9 cm = volume: 63 mL. Contains 11 mm nonobstructive stone in the lower pole.  Bladder:  Appears normal for degree of bladder distention.  Other:  The bladder is decompressed and not assessed on today's study.  IMPRESSION: 1. 11 mm nonobstructive stone in the lower pole of the left kidney. 2. No other abnormalities. The bladder was not assessed due to lack of distention.   Electronically Signed By: Dorise Bullion III M.D. On: 05/21/2022 16:40  No valid procedures specified. Results for orders placed during the hospital encounter of 09/09/21  CT HEMATURIA WORKUP  Narrative CLINICAL DATA:  Gross hematuria, chronic cystitis  EXAM: CT ABDOMEN AND PELVIS WITHOUT AND WITH CONTRAST  TECHNIQUE: Multidetector CT imaging of the abdomen and pelvis was performed following the standard protocol before and following the bolus administration of intravenous contrast.  RADIATION DOSE REDUCTION: This exam was performed according to the departmental dose-optimization program which includes automated exposure control, adjustment of the mA and/or kV according to patient size and/or use of iterative reconstruction technique.  CONTRAST:  40m OMNIPAQUE  IOHEXOL 300 MG/ML  SOLN  COMPARISON:  12/17/2020  FINDINGS: Lower chest: No acute abnormality. Bandlike scarring of the bilateral lung bases.  Hepatobiliary: No focal liver abnormality is seen. Status post cholecystectomy. No biliary dilatation.  Pancreas: Unremarkable. No pancreatic ductal dilatation or surrounding inflammatory changes.  Spleen: Normal in size without significant abnormality.  Adrenals/Urinary Tract: Adrenal glands are unremarkable. Multiple small bilateral nonobstructive renal calculi. No ureteral calculi or hydronephrosis. Relatively atrophic, lobular appearance of the left kidney. No suspicious mass or contrast  enhancement. No urinary tract filling defect on delayed phase imaging. Bladder is unremarkable.  Stomach/Bowel: Stomach is within normal limits. Appendix appears normal. No evidence of bowel wall thickening, distention, or inflammatory changes.  Vascular/Lymphatic: No significant vascular findings are present. No enlarged abdominal or pelvic lymph nodes.  Reproductive: No mass or other significant abnormality.  Other: No abdominal wall hernia or abnormality. No ascites.  Musculoskeletal: No acute or significant osseous findings.  IMPRESSION: 1. Multiple small bilateral nonobstructive renal calculi. No ureteral calculi or hydronephrosis. 2. Relatively atrophic, lobular appearance of the left kidney, in keeping with prior obstructive, infectious, or ischemic insult. 3. No suspicious mass or contrast enhancement. No urinary tract filling defect on delayed phase imaging. 4. Normal bladder.   Electronically Signed By: Delanna Ahmadi M.D. On: 09/09/2021 10:55  Results for orders placed in visit on 08/21/20  CT RENAL STONE STUDY  Narrative CLINICAL DATA:  Left flank pain for 3 months. Hematuria. Nephrolithiasis.  EXAM: CT ABDOMEN AND PELVIS WITHOUT CONTRAST  TECHNIQUE: Multidetector CT imaging of the abdomen and pelvis was  performed following the standard protocol without IV contrast.  COMPARISON:  03/21/2019  FINDINGS: Lower chest: No acute findings.  Hepatobiliary: No mass visualized on this unenhanced exam. Prior cholecystectomy. No evidence of biliary obstruction.  Pancreas: No mass or inflammatory process visualized on this unenhanced exam.  Spleen:  Within normal limits in size.  Adrenals/Urinary tract: Moderate diffuse left renal parenchymal atrophy and scarring is again seen. A few renal calculi are seen bilaterally, largest in the lower pole of the left kidney measuring 8 mm. No evidence of ureteral calculi or hydronephrosis. Unremarkable unopacified urinary bladder.  Stomach/Bowel: No evidence of obstruction, inflammatory process, or abnormal fluid collections. Normal appendix visualized.  Vascular/Lymphatic: No pathologically enlarged lymph nodes identified. No evidence of abdominal aortic aneurysm.  Reproductive:  No mass or other significant abnormality.  Other:  None.  Musculoskeletal:  No suspicious bone lesions identified.  IMPRESSION: Bilateral nephrolithiasis. No evidence of ureteral calculi, hydronephrosis, or other acute findings.  Chronic diffuse left renal parenchymal atrophy and scarring.   Electronically Signed By: Marlaine Hind M.D. On: 08/21/2020 16:45   Assessment & Plan:    1. Nephrolithiasis -We discussed the management of kidney stones. These options include observation, ureteroscopy, shockwave lithotripsy (ESWL) and percutaneous nephrolithotomy (PCNL). We discussed which options are relevant to the patient's stone(s). We discussed the natural history of kidney stones as well as the complications of untreated stones and the impact on quality of life without treatment as well as with each of the above listed treatments. We also discussed the efficacy of each treatment in its ability to clear the stone burden. With any of these management options I discussed  the signs and symptoms of infection and the need for emergent treatment should these be experienced. For each option we discussed the ability of each procedure to clear the patient of their stone burden.   For observation I described the risks which include but are not limited to silent renal damage, life-threatening infection, need for emergent surgery, failure to pass stone and pain.   For ureteroscopy I described the risks which include bleeding, infection, damage to contiguous structures, positioning injury, ureteral stricture, ureteral avulsion, ureteral injury, need for prolonged ureteral stent, inability to perform ureteroscopy, need for an interval procedure, inability to clear stone burden, stent discomfort/pain, heart attack, stroke, pulmonary embolus and the inherent risks with general anesthesia.   For shockwave lithotripsy I described the risks which include arrhythmia,  kidney contusion, kidney hemorrhage, need for transfusion, pain, inability to adequately break up stone, inability to pass stone fragments, Steinstrasse, infection associated with obstructing stones, need for alternate surgical procedure, need for repeat shockwave lithotripsy, MI, CVA, PE and the inherent risks with anesthesia/conscious sedation.   For PCNL I described the risks including positioning injury, pneumothorax, hydrothorax, need for chest tube, inability to clear stone burden, renal laceration, arterial venous fistula or malformation, need for embolization of kidney, loss of kidney or renal function, need for repeat procedure, need for prolonged nephrostomy tube, ureteral avulsion, MI, CVA, PE and the inherent risks of general anesthesia.   - The patient would like to proceed with left ureteroscopic stone extraction. - Urinalysis, Routine w reflex microscopic  2. Chronic cystitis -continue macrodantin '50mg'$  qhs   No follow-ups on file.  Nicolette Bang, MD  [facility] [place]

## 2022-06-03 NOTE — Progress Notes (Signed)
06/03/2022 3:03 PM   [name] [birth year] [id number]  Referring provider: Lindell Spar, MD [address] [place],  [place] [address]  Followup frequent UTI and nephrolithiasis   HPI: [name] is a 52yo here for followup for nephrolithiasis and frequent UTIs. No recent UTIs. She is on macrodantin '50mg'$  qhs for prophylaxis. Renal US 12/20 shows a 31m left lower pole calculus. She has mild intermittent left flank pain. NO other significant LUTS. No hematuria or dysuria   PMH: Past Medical History:  Diagnosis Date   Anxiety    Chronic kidney disease    CKD (chronic kidney disease) stage 3, GFR 30-59 ml/min (HCC) 03/15/2019   Depression    Diarrhea 05/04/2017   Essential hypertension, benign 03/15/2019   Hypertension    Hypothyroidism    Hypothyroidism, adult 03/15/2019   Kidney stones    LGSIL on Pap smear of cervix 08/17/2020   Needs colpo per [facility] immediate risk is 4.3 for CIN 3+risk    Obesity (BMI 30-39.9) 03/15/2019   Renal disorder    Vitamin D deficiency disease 03/15/2019    Surgical History: Past Surgical History:  Procedure Laterality Date   BIOPSY  06/05/2017   Procedure: BIOPSY;  Surgeon: RRogene Houston MD;  [facility];  Service: Endoscopy;;  colon   CHOLECYSTECTOMY     COLONOSCOPY N/A 06/05/2017   Procedure: COLONOSCOPY;  Surgeon: RRogene Houston MD;  [facility];  Service: Endoscopy;  Laterality: N/A;  8:30   CYSTOSCOPY WITH RETROGRADE PYELOGRAM, URETEROSCOPY AND STENT PLACEMENT Left 10/04/2020   Procedure: CYSTOSCOPY WITH LEFT RETROGRADE PYELOGRAM, DIAGNOSTIC LEFT URETEROSCOPY AND LEFT URETEREAL STENT PLACEMENT;  Surgeon: MCleon Gustin MD;  Location: [facility];  Service: Urology;  Laterality: Left;   CYSTOSCOPY WITH RETROGRADE PYELOGRAM, URETEROSCOPY AND STENT PLACEMENT Left 10/16/2020   Procedure: CYSTOSCOPY WITH LEFT RETROGRADE PYELOGRAM, LEFT URETEROSCOPY  WITH LASER AND LEFT URETERAL STENT EXCHANGE;  Surgeon: MCleon Gustin MD;  Location: [facility];  Service: Urology;  Laterality: Left;   CYSTOSCOPY WITH RETROGRADE PYELOGRAM, URETEROSCOPY AND STENT PLACEMENT Bilateral 02/17/2022   Procedure: CYSTOSCOPY WITH RETROGRADE PYELOGRAM, URETEROSCOPY AND STENT PLACEMENT;  Surgeon: MCleon Gustin MD;  Location: [facility];  Service: Urology;  Laterality: Bilateral;   HOLMIUM LASER APPLICATION Left 54/05/8785  Procedure: HOLMIUM LASER APPLICATION;  Surgeon: MCleon Gustin MD;  Location: [facility];  Service: Urology;  Laterality: Left;   KIDNEY STONE SURGERY     kidney stones     LASER ABLATION CONDOLAMATA N/A 10/31/2020   Procedure: LASER ABLATION OF THE CERVIX;  Surgeon: EFlorian Buff MD;  Location: [facility];  Service: Gynecology;  Laterality: N/A;    Home Medications:  Allergies as of 06/03/2022       Reactions   Morphine And Related Itching, Hives   Sulfa Antibiotics Swelling   Tape Itching, Other (See Comments)   Surgical         Medication List        Accurate as of June 03, 2022  3:03 PM. If you have any questions, ask your nurse or doctor.          ALPRAZolam 1 MG tablet Commonly known as: XANAX Take 1 mg by mouth 3 (three) times daily as needed for anxiety.   amLODipine 5 MG tablet Commonly known as: NORVASC Take 1 tablet (5 mg total) by mouth in the morning and at bedtime.   ARIPiprazole 5 MG tablet Commonly known as:  ABILIFY Take 5 mg by mouth at bedtime.   atorvastatin 20 MG tablet Commonly known as: LIPITOR Take 1 tablet (20 mg total) by mouth daily.   carvedilol 25 MG tablet Commonly known as: COREG Take 25 mg by mouth 2 (two) times daily.   chlorthalidone 25 MG tablet Commonly known as: HYGROTON Take 12.5 mg by mouth daily.   doxepin 75 MG capsule Commonly known as: SINEQUAN Take 225 mg by mouth at bedtime.   Ingrezza 60 MG capsule Generic drug: valbenazine Take 60 mg by mouth daily.   lamoTRIgine 200 MG tablet Commonly known as: LAMICTAL Take 200 mg by  mouth 2 (two) times daily.   losartan 25 MG tablet Commonly known as: COZAAR Take 25 mg by mouth in the morning.   medroxyPROGESTERone 150 MG/ML injection Commonly known as: DEPO-PROVERA Inject 1 mL (150 mg total) into the muscle every 3 (three) months.   nitrofurantoin 50 MG capsule Commonly known as: MACRODANTIN Take 1 capsule (50 mg total) by mouth at bedtime.   potassium citrate 5 MEQ (540 MG) SR tablet Commonly known as: UROCIT-K Take 10 mEq by mouth 3 (three) times daily.   QUEtiapine 400 MG 24 hr tablet Commonly known as: SEROQUEL XR Take 400 mg by mouth at bedtime.   Vitamin D3 125 MCG (5000 UT) Tabs Take 10,000 Units by mouth daily.        Allergies:  Allergies  Allergen Reactions   Morphine And Related Itching and Hives   Sulfa Antibiotics Swelling   Tape Itching and Other (See Comments)    Surgical     Family History: Family History  Problem Relation Age of Onset   Cancer Paternal Grandmother    Heart failure Paternal Grandmother    Cancer Maternal Grandmother    Cancer Father    Heart failure Father    Lung cancer Mother    Colon cancer Neg Hx     Social History:  reports that she quit smoking about 3 months ago. Her smoking use included cigarettes. She has a 10.00 pack-year smoking history. She has never used smokeless tobacco. She reports that she does not drink alcohol and does not use drugs.  ROS: All other review of systems were reviewed and are negative except what is noted above in HPI  Physical Exam: BP 135/82   Pulse 93   Constitutional:  Alert and oriented, No acute distress. HEENT: [place] AT, moist mucus membranes.  Trachea midline, no masses. Cardiovascular: No clubbing, cyanosis, or edema. Respiratory: Normal respiratory effort, no increased work of breathing. GI: Abdomen is soft, nontender, nondistended, no abdominal masses GU: No CVA tenderness.  Lymph: No cervical or inguinal lymphadenopathy. Skin: No rashes, bruises or  suspicious lesions. Neurologic: Grossly intact, no focal deficits, moving all 4 extremities. Psychiatric: Normal mood and affect.  Laboratory Data: Lab Results  Component Value Date   WBC 7.2 10/30/2020   HGB 12.6 10/30/2020   HCT 39.8 10/30/2020   MCV 89.4 10/30/2020   PLT 410 (H) 10/30/2020    Lab Results  Component Value Date   CREATININE 1.52 (H) 08/31/2021    No results found for: "PSA"  No results found for: "TESTOSTERONE"  Lab Results  Component Value Date   HGBA1C 5.2 09/04/2021    Urinalysis    Component Value Date/Time   COLORURINE YELLOW 10/30/2020 1355   APPEARANCEUR Cloudy (A) 02/24/2022 0838   LABSPEC 1.025 10/30/2020 1355   PHURINE 5.0 10/30/2020 1355   GLUCOSEU Negative 02/24/2022 7425  HGBUR NEGATIVE 10/30/2020 1355   BILIRUBINUR Negative 02/24/2022 0838   KETONESUR NEGATIVE 10/30/2020 1355   PROTEINUR 3+ (A) 02/24/2022 0838   PROTEINUR 30 (A) 10/30/2020 1355   UROBILINOGEN 0.2 01/17/2009 0124   NITRITE Negative 02/24/2022 0838   NITRITE NEGATIVE 10/30/2020 1355   LEUKOCYTESUR 3+ (A) 02/24/2022 0838   LEUKOCYTESUR SMALL (A) 10/30/2020 1355    Lab Results  Component Value Date   LABMICR See below: 02/24/2022   WBCUA 11-30 (A) 02/24/2022   LABEPIT 0-10 02/24/2022   MUCUS Present 09/23/2021   BACTERIA Moderate (A) 02/24/2022    Pertinent Imaging: Renal US 05/21/2022: Images reviewed and discussed with the patient Results for orders placed during the hospital encounter of 08/28/20  Abdomen 1 view (KUB)  Narrative CLINICAL DATA:  Nephrolithiasis  EXAM: ABDOMEN - 1 VIEW  COMPARISON:  CT 08/21/2020  FINDINGS: The bowel gas pattern is normal. 8 mm stone again seen projecting over the left renal shadow. No right sided renal calculi, which is partially obscured by overlying bowel gas and stool. Moderate volume stool throughout the colon. No acute osseous findings. Other significant radiographic abnormality are seen.  IMPRESSION: 8  mm stone projecting over the left renal shadow, similar to prior exam.   Electronically Signed By: Davina Poke D.O. On: 08/29/2020 10:41  No results found for this or any previous visit.  No results found for this or any previous visit.  No results found for this or any previous visit.  Results for orders placed during the hospital encounter of 05/21/22  Ultrasound renal complete  Narrative CLINICAL DATA:  History of stones.  Follow-up.  EXAM: RENAL / URINARY TRACT ULTRASOUND COMPLETE  COMPARISON:  August 30, 2021  FINDINGS: Right Kidney:  Renal measurements: 10 x 5 x 4 cm = volume: 106 mL. Echogenicity within normal limits. No mass or hydronephrosis visualized.  Left Kidney:  Renal measurements: 7.6 x 4.1 x 3.9 cm = volume: 63 mL. Contains 11 mm nonobstructive stone in the lower pole.  Bladder:  Appears normal for degree of bladder distention.  Other:  The bladder is decompressed and not assessed on today's study.  IMPRESSION: 1. 11 mm nonobstructive stone in the lower pole of the left kidney. 2. No other abnormalities. The bladder was not assessed due to lack of distention.   Electronically Signed By: Dorise Bullion III M.D. On: 05/21/2022 16:40  No valid procedures specified. Results for orders placed during the hospital encounter of 09/09/21  CT HEMATURIA WORKUP  Narrative CLINICAL DATA:  Gross hematuria, chronic cystitis  EXAM: CT ABDOMEN AND PELVIS WITHOUT AND WITH CONTRAST  TECHNIQUE: Multidetector CT imaging of the abdomen and pelvis was performed following the standard protocol before and following the bolus administration of intravenous contrast.  RADIATION DOSE REDUCTION: This exam was performed according to the departmental dose-optimization program which includes automated exposure control, adjustment of the mA and/or kV according to patient size and/or use of iterative reconstruction technique.  CONTRAST:  67m OMNIPAQUE  IOHEXOL 300 MG/ML  SOLN  COMPARISON:  12/17/2020  FINDINGS: Lower chest: No acute abnormality. Bandlike scarring of the bilateral lung bases.  Hepatobiliary: No focal liver abnormality is seen. Status post cholecystectomy. No biliary dilatation.  Pancreas: Unremarkable. No pancreatic ductal dilatation or surrounding inflammatory changes.  Spleen: Normal in size without significant abnormality.  Adrenals/Urinary Tract: Adrenal glands are unremarkable. Multiple small bilateral nonobstructive renal calculi. No ureteral calculi or hydronephrosis. Relatively atrophic, lobular appearance of the left kidney. No suspicious mass or contrast  enhancement. No urinary tract filling defect on delayed phase imaging. Bladder is unremarkable.  Stomach/Bowel: Stomach is within normal limits. Appendix appears normal. No evidence of bowel wall thickening, distention, or inflammatory changes.  Vascular/Lymphatic: No significant vascular findings are present. No enlarged abdominal or pelvic lymph nodes.  Reproductive: No mass or other significant abnormality.  Other: No abdominal wall hernia or abnormality. No ascites.  Musculoskeletal: No acute or significant osseous findings.  IMPRESSION: 1. Multiple small bilateral nonobstructive renal calculi. No ureteral calculi or hydronephrosis. 2. Relatively atrophic, lobular appearance of the left kidney, in keeping with prior obstructive, infectious, or ischemic insult. 3. No suspicious mass or contrast enhancement. No urinary tract filling defect on delayed phase imaging. 4. Normal bladder.   Electronically Signed By: Delanna Ahmadi M.D. On: 09/09/2021 10:55  Results for orders placed in visit on 08/21/20  CT RENAL STONE STUDY  Narrative CLINICAL DATA:  Left flank pain for 3 months. Hematuria. Nephrolithiasis.  EXAM: CT ABDOMEN AND PELVIS WITHOUT CONTRAST  TECHNIQUE: Multidetector CT imaging of the abdomen and pelvis was  performed following the standard protocol without IV contrast.  COMPARISON:  03/21/2019  FINDINGS: Lower chest: No acute findings.  Hepatobiliary: No mass visualized on this unenhanced exam. Prior cholecystectomy. No evidence of biliary obstruction.  Pancreas: No mass or inflammatory process visualized on this unenhanced exam.  Spleen:  Within normal limits in size.  Adrenals/Urinary tract: Moderate diffuse left renal parenchymal atrophy and scarring is again seen. A few renal calculi are seen bilaterally, largest in the lower pole of the left kidney measuring 8 mm. No evidence of ureteral calculi or hydronephrosis. Unremarkable unopacified urinary bladder.  Stomach/Bowel: No evidence of obstruction, inflammatory process, or abnormal fluid collections. Normal appendix visualized.  Vascular/Lymphatic: No pathologically enlarged lymph nodes identified. No evidence of abdominal aortic aneurysm.  Reproductive:  No mass or other significant abnormality.  Other:  None.  Musculoskeletal:  No suspicious bone lesions identified.  IMPRESSION: Bilateral nephrolithiasis. No evidence of ureteral calculi, hydronephrosis, or other acute findings.  Chronic diffuse left renal parenchymal atrophy and scarring.   Electronically Signed By: Marlaine Hind M.D. On: 08/21/2020 16:45   Assessment & Plan:    1. Nephrolithiasis -We discussed the management of kidney stones. These options include observation, ureteroscopy, shockwave lithotripsy (ESWL) and percutaneous nephrolithotomy (PCNL). We discussed which options are relevant to the patient's stone(s). We discussed the natural history of kidney stones as well as the complications of untreated stones and the impact on quality of life without treatment as well as with each of the above listed treatments. We also discussed the efficacy of each treatment in its ability to clear the stone burden. With any of these management options I discussed  the signs and symptoms of infection and the need for emergent treatment should these be experienced. For each option we discussed the ability of each procedure to clear the patient of their stone burden.   For observation I described the risks which include but are not limited to silent renal damage, life-threatening infection, need for emergent surgery, failure to pass stone and pain.   For ureteroscopy I described the risks which include bleeding, infection, damage to contiguous structures, positioning injury, ureteral stricture, ureteral avulsion, ureteral injury, need for prolonged ureteral stent, inability to perform ureteroscopy, need for an interval procedure, inability to clear stone burden, stent discomfort/pain, heart attack, stroke, pulmonary embolus and the inherent risks with general anesthesia.   For shockwave lithotripsy I described the risks which include arrhythmia,  kidney contusion, kidney hemorrhage, need for transfusion, pain, inability to adequately break up stone, inability to pass stone fragments, Steinstrasse, infection associated with obstructing stones, need for alternate surgical procedure, need for repeat shockwave lithotripsy, MI, CVA, PE and the inherent risks with anesthesia/conscious sedation.   For PCNL I described the risks including positioning injury, pneumothorax, hydrothorax, need for chest tube, inability to clear stone burden, renal laceration, arterial venous fistula or malformation, need for embolization of kidney, loss of kidney or renal function, need for repeat procedure, need for prolonged nephrostomy tube, ureteral avulsion, MI, CVA, PE and the inherent risks of general anesthesia.   - The patient would like to proceed with left ureteroscopic stone extraction. - Urinalysis, Routine w reflex microscopic  2. Chronic cystitis -continue macrodantin '50mg'$  qhs   No follow-ups on file.  Nicolette Bang, MD  [facility] [place]

## 2022-06-06 ENCOUNTER — Encounter: Payer: Self-pay | Admitting: Urology

## 2022-06-09 ENCOUNTER — Ambulatory Visit (HOSPITAL_COMMUNITY)
Admission: RE | Admit: 2022-06-09 | Discharge: 2022-06-09 | Disposition: A | Discharge status: Home / Self Care | Payer: Medicare HMO | Source: Ambulatory Visit | Attending: Neurology | Admitting: Neurology

## 2022-06-09 DIAGNOSIS — R292 Abnormal reflex: Secondary | ICD-10-CM | POA: Diagnosis not present

## 2022-06-09 DIAGNOSIS — R55 Syncope and collapse: Secondary | ICD-10-CM | POA: Diagnosis not present

## 2022-06-09 DIAGNOSIS — R296 Repeated falls: Secondary | ICD-10-CM | POA: Diagnosis not present

## 2022-06-09 DIAGNOSIS — R200 Anesthesia of skin: Secondary | ICD-10-CM | POA: Diagnosis not present

## 2022-06-09 DIAGNOSIS — R413 Other amnesia: Secondary | ICD-10-CM | POA: Insufficient documentation

## 2022-06-09 DIAGNOSIS — R404 Transient alteration of awareness: Secondary | ICD-10-CM | POA: Diagnosis not present

## 2022-06-09 DIAGNOSIS — M47812 Spondylosis without myelopathy or radiculopathy, cervical region: Secondary | ICD-10-CM | POA: Diagnosis not present

## 2022-06-09 MED ORDER — GADOBUTROL 1 MMOL/ML IV SOLN
10.0000 mL | Freq: Once | INTRAVENOUS | Status: AC | PRN
  Administered 2022-06-09: 10 mL via INTRAVENOUS

## 2022-06-10 ENCOUNTER — Encounter: Payer: Self-pay | Admitting: Urology

## 2022-06-10 MED ORDER — OXYCODONE-ACETAMINOPHEN 5-325 MG PO TABS: 1.0000 | ORAL_TABLET | ORAL | 0 refills | Status: DC | PRN

## 2022-06-10 NOTE — Patient Instructions (Signed)
Ureteroscopy  Ureteroscopy is a procedure to check for and treat problems inside part of the urinary tract. In this procedure, a long rigid or flexible tube with a lens and light at the end (ureteroscope) is used to look at the inside of the kidneys and the ureters. The ureters are the tubes that carry urine from the kidneys to the bladder. The ureteroscope is inserted into one or both of the ureters. You may need this procedure if you have frequent urinary tract infections (UTIs), blood in your urine, or a stone in one or both of your ureters. A ureteroscopy can be done: To find the cause of urine blockage in a ureter and to evaluate other abnormalities inside the ureters or kidneys. To remove stones. To remove or treat growths of tissue (polyps), abnormal tissue, and some types of tumors. To remove a tissue sample and check it for disease under a microscope (biopsy). Tell a health care provider about: Any allergies you have. All medicines you are taking, including vitamins, herbs, eye drops, creams, and over-the-counter medicines. Any problems you or family members have had with anesthetic medicines. Any bleeding problems you have. Any surgeries you have had. Any medical conditions you have. Whether you are pregnant or may be pregnant. What are the risks? Your health care provider will talk with you about risks. These may include: Abdominal pain or a burning feeling or pain while urinating. Abnormal bleeding. A UTI. Allergic reactions to medicines. Scarring that narrows the ureter (stricture) or swelling. Creating a hole (perforation) in the ureter. Damage to other structures or organs, such as the part of your body that drains urine from your bladder (urethra), your bladder, or your uterus. What happens before the procedure? When to stop eating and drinking  8 hours before your procedure Stop eating most foods. Do not eat meat, fried foods, or fatty foods. Eat only light foods, such  as toast or crackers. All liquids are okay except energy drinks and alcohol. 6 hours before your procedure Stop eating. Drink only clear liquids, such as water, clear fruit juice, black coffee, plain tea, and sports drinks. Do not drink energy drinks or alcohol. 2 hours before your procedure Stop drinking all liquids. You may be allowed to take medicines with small sips of water. Medicines Ask your health care provider about: Changing or stopping your regular medicines. These include any diabetes medicines or blood thinners you take. Taking medicines such as aspirin and ibuprofen. These medicines can thin your blood. Do not take these medicines unless your health care provider tells you to. Taking over-the-counter medicines, vitamins, herbs, and supplements. General instructions Do not use any products that contain nicotine or tobacco for at least 4 weeks before the procedure. These products include cigarettes, chewing tobacco, and vaping devices, such as e-cigarettes. If you need help quitting, ask your health care provider. If you will be going home right after the procedure, plan to have a responsible adult: Take you home from the hospital or clinic. You will not be allowed to drive. Care for you for the time you are told. Ask your health care provider what steps will be taken to help prevent infection. These may include: Washing skin with a soap that kills germs. Receiving antibiotic medicine. Tests You may have an exam or testing. You may have a urine sample taken to check for infection. What happens during the procedure? An IV will be inserted into one of your veins. You may be given: A sedative. This helps   you relax. Anesthesia. This will: Numb certain areas of your body. Make you fall asleep for surgery. Your urethra will be cleaned with a germ-killing solution. The ureteroscope will be passed through your urethra into your bladder. A salt-water solution will be sent through  the ureteroscope to fill your bladder. This will help the health care provider see the openings of your ureters more clearly. The ureteroscope will be passed into your ureter. If a growth is found, a biopsy may be done. If a stone is found, it may be removed through the ureteroscope, or the stone may be broken up using a laser, shock waves, or electrical energy. In some cases, if the ureter is too small, a tube may be inserted that keeps the ureter open (ureteral stent). The stent may be left in place for 1 or 2 weeks, and then the ureteroscopy procedure will be done again. The scope will be removed, and your bladder will be emptied. The procedure may vary among health care providers and hospitals. What happens after the procedure? Your blood pressure, heart rate, breathing rate, and blood oxygen level will be monitored until you leave the hospital or clinic. It is up to you to get the results of your procedure. Ask your health care provider, or the department that is doing the procedure, when your results will be ready. Summary Ureteroscopy is a procedure used to look at the inside of the kidneys and the ureters. You may need this procedure if you have frequent urinary tract infections (UTIs), blood in your urine, or a stone in one or both of your ureters. Follow instructions from your health care provider about eating and drinking. In some cases, if the ureter is too small, a tube may be inserted that keeps the ureter open (ureteral stent). The stent may be left in place for 1 or 2 weeks to keep the ureter open, and then the ureteroscopy procedure will be done again. This information is not intended to replace advice given to you by your health care provider. Make sure you discuss any questions you have with your health care provider. Document Revised: 09/13/2021 Document Reviewed: 09/13/2021 Elsevier Patient Education  2023 [employer] Inc.  

## 2022-06-17 ENCOUNTER — Ambulatory Visit: Payer: Self-pay

## 2022-06-17 NOTE — Patient Instructions (Signed)
Visit Information  Thank you for taking time to visit with me today. Please don't hesitate to contact me if I can be of assistance to you.   Following are the goals we discussed today:   Goals Addressed               This Visit's Progress     Falls [facility]) (pt-stated)        Care Coordination Interventions: Provided written and verbal education re: potential causes of falls and Fall prevention strategies Advised patient of importance of notifying provider of falls Assessed for signs and symptoms of orthostatic hypotension Assessed for falls since last encounter Reviewed to get up slowly and pump feet when getting up.  Encouraged to drink adequate amounts of fluid to help with hypotension      manage pain on left side ([facility]) (pt-stated)        Care Coordination Interventions: Counseled on the importance of reporting any/all new or changed pain symptoms or management strategies to pain management provider Reviewed with patient prescribed pharmacological and nonpharmacological pain relief strategies Reviewed to take pain medication as needed when she has pain.  Encouraged to drink adequate amounts of fluids   Pt scheduled for cystoscopy with stent and stone extraction on 06/25/22        Our next appointment is by telephone on 07/21/22 at 3 PM  Please call the care guide team at [phone] if you need to cancel or reschedule your appointment.   If you are experiencing a Mental Health or [place] or need someone to talk to, please call the Suicide and Crisis Lifeline: 988 call the [place] National Suicide Prevention Lifeline: (604)753-1647 or TTY: 414-310-1016 TTY (618)276-8831) to talk to a trained counselor call 1-800-273-TALK (toll free, 24 hour hotline) call the [facility]: [phone] call 911   Patient verbalizes understanding of instructions and care plan provided today and agrees to view in [place]. Active MyChart status and patient  understanding of how to access instructions and care plan via MyChart confirmed with patient.     Telephone follow up appointment with care management team member scheduled for: 07/21/22  Peter Garter RN, Jackquline Denmark, CDE Care Management Coordinator [place] [phone]

## 2022-06-17 NOTE — Patient Outreach (Signed)
  Care Coordination   Follow Up Visit Note   06/17/2022 Name: [name] MRN: [id number] DOB: 02-10-1971  [name] is a 52 y.o. year old female who sees Lindell Spar, MD for primary care. I spoke with  [name] by phone today.  What matters to the patients health and wellness today?  I am having surgery on 06/25/22 to have my kidney stone taken out which has been causing my pain.  I saw the neurologist and still trying to find out why I fall and black out.  She thinks it might be my B/P dropping    Goals Addressed               This Visit's Progress     Falls [facility]) (pt-stated)        Care Coordination Interventions: Provided written and verbal education re: potential causes of falls and Fall prevention strategies Advised patient of importance of notifying provider of falls Assessed for signs and symptoms of orthostatic hypotension Assessed for falls since last encounter Reviewed to get up slowly and pump feet when getting up.  Encouraged to drink adequate amounts of fluid to help with hypotension      manage pain on left side ([facility]) (pt-stated)        Care Coordination Interventions: Counseled on the importance of reporting any/all new or changed pain symptoms or management strategies to pain management provider Reviewed with patient prescribed pharmacological and nonpharmacological pain relief strategies Reviewed to take pain medication as needed when she has pain.  Encouraged to drink adequate amounts of fluids   Pt scheduled for cystoscopy with stent and stone extraction on 06/25/22        SDOH assessments and interventions completed:  No     Care Coordination Interventions:  Yes, provided   Follow up plan: Follow up call scheduled for 07/21/22    Encounter Outcome:  Pt. Visit Completed

## 2022-06-23 ENCOUNTER — Ambulatory Visit: Payer: Medicare HMO | Admitting: Neurology

## 2022-06-23 DIAGNOSIS — R404 Transient alteration of awareness: Secondary | ICD-10-CM | POA: Diagnosis not present

## 2022-06-23 NOTE — Progress Notes (Signed)
Ambulatory EEG hooked up and running. Light flashing. Push button tested. Camera and event log explained. Patient understood.

## 2022-06-23 NOTE — Patient Instructions (Signed)
[name]  06/23/2022     '@PREFPERIOPPHARMACY'$ @   Your procedure is scheduled on  06/26/2022.   Report to [facility] at  0600  A.M.   Call this number if you have problems the morning of surgery:  [phone]  If you experience any cold or flu symptoms such as cough, fever, chills, shortness of breath, etc. between now and your scheduled surgery, please notify us at the above number.   Remember:  Do not eat or drink after midnight.      Take these medicines the morning of surgery with A SIP OF WATER           xanax(if needed), amlodipine, carvedilol, lamictal, oxycodone(if needed).     Do not wear jewelry, make-up or nail polish.  Do not wear lotions, powders, or perfumes, or deodorant.  Do not shave 48 hours prior to surgery.  Men may shave face and neck.  Do not bring valuables to the hospital.  [facility] is not responsible for any belongings or valuables.  Contacts, dentures or bridgework may not be worn into surgery.  Leave your suitcase in the car.  After surgery it may be brought to your room.  For patients admitted to the hospital, discharge time will be determined by your treatment team.  Patients discharged the day of surgery will not be allowed to drive home and must have someone with them for 24 hours.    Special instructions:   DO NOT smoke tobacco or vape for 24 hours before your procedure.  Please read over the following fact sheets that you were given. Coughing and Deep Breathing, Surgical Site Infection Prevention, Anesthesia Post-op Instructions, and Care and Recovery After Surgery      Ureteral Stent Implantation, Care After The following information offers guidance on how to care for yourself after your procedure. Your health care provider may also give you more specific instructions. If you have problems or questions, contact your health care provider. What can I expect after the procedure? After the procedure, it is common to  have: Nausea. Mild pain when you urinate. You may feel this pain in your lower back or lower abdomen. The pain should stop within a few minutes after you urinate. This pattern may last for up to 1 week. A small amount of blood in your urine for several days. Follow these instructions at home: Medicines Take over-the-counter and prescription medicines only as told by your health care provider. If you were prescribed antibiotics, take them as told by your health care provider. Do not stop using the antibiotic even if you start to feel better. If you were given a sedative during the procedure, it can affect you for several hours. Do not drive or operate machinery until your health care provider says that it is safe. Ask your health care provider if the medicine prescribed to you: Requires you to avoid driving or using machinery. Can cause constipation. You may need to take these actions to prevent or treat constipation: Take over-the-counter or prescription medicines. Eat foods that are high in fiber, such as beans, whole grains, and fresh fruits and vegetables. Limit foods that are high in fat and processed sugars, such as fried or sweet foods. Activity Rest as told by your health care provider. Do not sit for a long time without moving. Get up to take short walks every 1-2 hours. This will improve blood flow and breathing. Ask for help if you feel  weak or unsteady. Return to your normal activities as told by your health care provider. Ask your health care provider what activities are safe for you. General instructions  If you have a catheter: Follow instructions from your health care provider about taking care of your catheter and collection bag. Do not take baths, swim, or use a hot tub until your health care provider approves. Ask your health care provider if you may take showers. You may only be allowed to take sponge baths. Drink enough fluid to keep your urine pale yellow. Do not use any  products that contain nicotine or tobacco. These products include cigarettes, chewing tobacco, and vaping devices, such as e-cigarettes. These can delay healing after surgery. If you need help quitting, ask your health care provider. Keep all follow-up visits. Contact a health care provider if: You start passing blood clots, or you have more than a small amount of blood in your urine. You have pain that gets worse or does not get better with medicine, especially pain when you urinate. You have trouble urinating. You feel nauseous or you vomit again and again during a period of more than 2 days after the procedure. You have a fever. Get help right away if: You are passing blood clots that are 1 inch (2.5 cm) or larger in size. You are leaking urine (have incontinence), or you cannot urinate. The end of the stent comes out of your urethra. You have sudden, sharp, or severe pain in your abdomen or lower back. You have swelling or pain in your legs. You have trouble breathing. These symptoms may be an emergency. Get help right away. Call 911. Do not wait to see if the symptoms will go away. Do not drive yourself to the hospital. Summary After the procedure, it is common to have mild pain when you urinate that goes away within a few minutes after you urinate. This may last for up to 1 week. Take over-the-counter and prescription medicines only as told by your health care provider. Drink enough fluid to keep your urine pale yellow. Call your health care provider if you start passing blood clots, or you have more than a small amount of blood in your urine. This information is not intended to replace advice given to you by your health care provider. Make sure you discuss any questions you have with your health care provider. Document Revised: 06/24/2021 Document Reviewed: 06/24/2021 Elsevier Patient Education  [place] Anesthesia, Adult, Care After The following information  offers guidance on how to care for yourself after your procedure. Your health care provider may also give you more specific instructions. If you have problems or questions, contact your health care provider. What can I expect after the procedure? After the procedure, it is common for people to: Have pain or discomfort at the IV site. Have nausea or vomiting. Have a sore throat or hoarseness. Have trouble concentrating. Feel cold or chills. Feel weak, sleepy, or tired (fatigue). Have soreness and body aches. These can affect parts of the body that were not involved in surgery. Follow these instructions at home: For the time period you were told by your health care provider:  Rest. Do not participate in activities where you could fall or become injured. Do not drive or use machinery. Do not drink alcohol. Do not take sleeping pills or medicines that cause drowsiness. Do not make important decisions or sign legal documents. Do not take care of children on your own. General instructions  Drink enough fluid to keep your urine pale yellow. If you have sleep apnea, surgery and certain medicines can increase your risk for breathing problems. Follow instructions from your health care provider about wearing your sleep device: Anytime you are sleeping, including during daytime naps. While taking prescription pain medicines, sleeping medicines, or medicines that make you drowsy. Return to your normal activities as told by your health care provider. Ask your health care provider what activities are safe for you. Take over-the-counter and prescription medicines only as told by your health care provider. Do not use any products that contain nicotine or tobacco. These products include cigarettes, chewing tobacco, and vaping devices, such as e-cigarettes. These can delay incision healing after surgery. If you need help quitting, ask your health care provider. Contact a health care provider if: You have  nausea or vomiting that does not get better with medicine. You vomit every time you eat or drink. You have pain that does not get better with medicine. You cannot urinate or have bloody urine. You develop a skin rash. You have a fever. Get help right away if: You have trouble breathing. You have chest pain. You vomit blood. These symptoms may be an emergency. Get help right away. Call 911. Do not wait to see if the symptoms will go away. Do not drive yourself to the hospital. Summary After the procedure, it is common to have a sore throat, hoarseness, nausea, vomiting, or to feel weak, sleepy, or fatigue. For the time period you were told by your health care provider, do not drive or use machinery. Get help right away if you have difficulty breathing, have chest pain, or vomit blood. These symptoms may be an emergency. This information is not intended to replace advice given to you by your health care provider. Make sure you discuss any questions you have with your health care provider. Document Revised: 08/16/2021 Document Reviewed: 08/16/2021 Elsevier Patient Education  [place].

## 2022-06-25 ENCOUNTER — Encounter (HOSPITAL_COMMUNITY)
Admission: RE | Admit: 2022-06-25 | Discharge: 2022-06-25 | Disposition: A | Discharge status: Home / Self Care | Payer: Medicare HMO | Source: Ambulatory Visit | Attending: Urology | Admitting: Urology

## 2022-06-25 ENCOUNTER — Encounter (HOSPITAL_COMMUNITY): Payer: Self-pay

## 2022-06-25 VITALS — BP 152/83 | HR 99 | Temp 97.7°F | Resp 18 | Ht 63.0 in | Wt 201.7 lb

## 2022-06-25 DIAGNOSIS — I129 Hypertensive chronic kidney disease with stage 1 through stage 4 chronic kidney disease, or unspecified chronic kidney disease: Secondary | ICD-10-CM | POA: Insufficient documentation

## 2022-06-25 DIAGNOSIS — Z01818 Encounter for other preprocedural examination: Secondary | ICD-10-CM | POA: Diagnosis not present

## 2022-06-25 DIAGNOSIS — N1832 Chronic kidney disease, stage 3b: Secondary | ICD-10-CM | POA: Diagnosis not present

## 2022-06-25 DIAGNOSIS — I10 Essential (primary) hypertension: Secondary | ICD-10-CM

## 2022-06-25 HISTORY — DX: Personal history of urinary calculi: Z87.442

## 2022-06-25 LAB — BASIC METABOLIC PANEL
Anion gap: 14 (ref 5–15)
BUN: 18 mg/dL (ref 6–20)
CO2: 20 mmol/L — ABNORMAL LOW (ref 22–32)
Calcium: 9.1 mg/dL (ref 8.9–10.3)
Chloride: 104 mmol/L (ref 98–111)
Creatinine, Ser: 1.25 mg/dL — ABNORMAL HIGH (ref 0.44–1.00)
GFR, Estimated: 53 mL/min — ABNORMAL LOW (ref >60)
Glucose, Bld: 78 mg/dL (ref 70–99)
Potassium: 3.6 mmol/L (ref 3.5–5.1)
Sodium: 138 mmol/L (ref 135–145)

## 2022-06-25 LAB — POCT PREGNANCY, URINE: Preg Test, Ur: NEGATIVE

## 2022-06-26 ENCOUNTER — Ambulatory Visit (HOSPITAL_COMMUNITY): Payer: Medicare HMO

## 2022-06-26 ENCOUNTER — Ambulatory Visit (HOSPITAL_COMMUNITY): Payer: Medicare HMO | Admitting: Anesthesiology

## 2022-06-26 ENCOUNTER — Ambulatory Visit (HOSPITAL_COMMUNITY)
Admission: RE | Admit: 2022-06-26 | Discharge: 2022-06-26 | Disposition: A | Discharge status: Home / Self Care | Payer: Medicare HMO | Attending: Urology | Admitting: Urology

## 2022-06-26 ENCOUNTER — Encounter (HOSPITAL_COMMUNITY): Payer: Self-pay | Admitting: Urology

## 2022-06-26 ENCOUNTER — Encounter (HOSPITAL_COMMUNITY)
Admission: RE | Disposition: A | Discharge status: Home / Self Care | Payer: Self-pay | Source: Home / Self Care | Attending: Urology

## 2022-06-26 ENCOUNTER — Other Ambulatory Visit: Payer: Self-pay

## 2022-06-26 ENCOUNTER — Ambulatory Visit (HOSPITAL_BASED_OUTPATIENT_CLINIC_OR_DEPARTMENT_OTHER): Payer: Medicare HMO | Admitting: Anesthesiology

## 2022-06-26 DIAGNOSIS — N200 Calculus of kidney: Secondary | ICD-10-CM

## 2022-06-26 DIAGNOSIS — E039 Hypothyroidism, unspecified: Secondary | ICD-10-CM | POA: Insufficient documentation

## 2022-06-26 DIAGNOSIS — N201 Calculus of ureter: Secondary | ICD-10-CM

## 2022-06-26 DIAGNOSIS — F319 Bipolar disorder, unspecified: Secondary | ICD-10-CM | POA: Diagnosis not present

## 2022-06-26 DIAGNOSIS — N183 Chronic kidney disease, stage 3 unspecified: Secondary | ICD-10-CM | POA: Insufficient documentation

## 2022-06-26 DIAGNOSIS — F419 Anxiety disorder, unspecified: Secondary | ICD-10-CM | POA: Insufficient documentation

## 2022-06-26 DIAGNOSIS — I129 Hypertensive chronic kidney disease with stage 1 through stage 4 chronic kidney disease, or unspecified chronic kidney disease: Secondary | ICD-10-CM | POA: Diagnosis not present

## 2022-06-26 DIAGNOSIS — Z87891 Personal history of nicotine dependence: Secondary | ICD-10-CM | POA: Insufficient documentation

## 2022-06-26 HISTORY — PX: CYSTOSCOPY WITH RETROGRADE PYELOGRAM, URETEROSCOPY AND STENT PLACEMENT: SHX5789

## 2022-06-26 HISTORY — PX: HOLMIUM LASER APPLICATION: SHX5852

## 2022-06-26 SURGERY — CYSTOURETEROSCOPY, WITH RETROGRADE PYELOGRAM AND STENT INSERTION
Anesthesia: General | Site: Bladder | Laterality: Left

## 2022-06-26 MED ORDER — FENTANYL CITRATE PF 50 MCG/ML IJ SOSY
50.0000 ug | PREFILLED_SYRINGE | Freq: Once | INTRAMUSCULAR | Status: AC
  Administered 2022-06-26: 50 ug via INTRAVENOUS

## 2022-06-26 MED ORDER — CEFAZOLIN SODIUM-DEXTROSE 2-4 GM/100ML-% IV SOLN
2.0000 g | INTRAVENOUS | Status: AC
  Administered 2022-06-26: 2 g via INTRAVENOUS
  Filled 2022-06-26: qty 100

## 2022-06-26 MED ORDER — DEXAMETHASONE SODIUM PHOSPHATE 10 MG/ML IJ SOLN
INTRAMUSCULAR | Status: AC
  Filled 2022-06-26: qty 1

## 2022-06-26 MED ORDER — CHLORHEXIDINE GLUCONATE 0.12 % MT SOLN
15.0000 mL | Freq: Once | OROMUCOSAL | Status: AC
  Administered 2022-06-26: 15 mL via OROMUCOSAL

## 2022-06-26 MED ORDER — HYDROMORPHONE HCL 1 MG/ML IJ SOLN
INTRAMUSCULAR | Status: DC | PRN
  Administered 2022-06-26 (×2): .5 mg via INTRAVENOUS

## 2022-06-26 MED ORDER — ONDANSETRON HCL 4 MG/2ML IJ SOLN
INTRAMUSCULAR | Status: AC
  Filled 2022-06-26: qty 2

## 2022-06-26 MED ORDER — PROPOFOL 10 MG/ML IV BOLUS
INTRAVENOUS | Status: AC
  Filled 2022-06-26: qty 20

## 2022-06-26 MED ORDER — DEXAMETHASONE SODIUM PHOSPHATE 10 MG/ML IJ SOLN
INTRAMUSCULAR | Status: DC | PRN
  Administered 2022-06-26: 5 mg via INTRAVENOUS

## 2022-06-26 MED ORDER — HYDROMORPHONE HCL 1 MG/ML IJ SOLN
0.2500 mg | INTRAMUSCULAR | Status: DC | PRN
  Administered 2022-06-26: 0.5 mg via INTRAVENOUS
  Filled 2022-06-26: qty 0.5

## 2022-06-26 MED ORDER — OXYCODONE-ACETAMINOPHEN 5-325 MG PO TABS: 1.0000 | ORAL_TABLET | ORAL | 0 refills | Status: DC | PRN

## 2022-06-26 MED ORDER — DIATRIZOATE MEGLUMINE 30 % UR SOLN
URETHRAL | Status: DC | PRN
  Administered 2022-06-26: 35 mL via URETHRAL

## 2022-06-26 MED ORDER — ONDANSETRON HCL 4 MG PO TABS: 4.0000 mg | ORAL_TABLET | Freq: Every day | ORAL | 1 refills | Status: DC | PRN

## 2022-06-26 MED ORDER — HYDROMORPHONE HCL 1 MG/ML IJ SOLN
INTRAMUSCULAR | Status: AC
  Filled 2022-06-26: qty 1

## 2022-06-26 MED ORDER — LACTATED RINGERS IV SOLN: INTRAVENOUS | Status: DC

## 2022-06-26 MED ORDER — DIATRIZOATE MEGLUMINE 30 % UR SOLN
URETHRAL | Status: AC
  Filled 2022-06-26: qty 100

## 2022-06-26 MED ORDER — FENTANYL CITRATE (PF) 100 MCG/2ML IJ SOLN
INTRAMUSCULAR | Status: AC
  Filled 2022-06-26: qty 2

## 2022-06-26 MED ORDER — ONDANSETRON HCL 4 MG/2ML IJ SOLN: 4.0000 mg | Freq: Once | INTRAMUSCULAR | Status: DC | PRN

## 2022-06-26 MED ORDER — ONDANSETRON HCL 4 MG/2ML IJ SOLN
INTRAMUSCULAR | Status: DC | PRN
  Administered 2022-06-26: 4 mg via INTRAVENOUS

## 2022-06-26 MED ORDER — LIDOCAINE HCL (CARDIAC) PF 50 MG/5ML IV SOSY
PREFILLED_SYRINGE | INTRAVENOUS | Status: DC | PRN
  Administered 2022-06-26: 100 mg via INTRAVENOUS

## 2022-06-26 MED ORDER — FENTANYL CITRATE (PF) 100 MCG/2ML IJ SOLN
INTRAMUSCULAR | Status: DC | PRN
  Administered 2022-06-26 (×2): 50 ug via INTRAVENOUS

## 2022-06-26 MED ORDER — WATER FOR IRRIGATION, STERILE IR SOLN
Status: DC | PRN
  Administered 2022-06-26: 500 mL

## 2022-06-26 MED ORDER — LIDOCAINE HCL (PF) 2 % IJ SOLN
INTRAMUSCULAR | Status: AC
  Filled 2022-06-26: qty 5

## 2022-06-26 MED ORDER — ORAL CARE MOUTH RINSE: 15.0000 mL | Freq: Once | OROMUCOSAL | Status: AC

## 2022-06-26 MED ORDER — NITROFURANTOIN MONOHYD MACRO 100 MG PO CAPS: 100.0000 mg | ORAL_CAPSULE | Freq: Two times a day (BID) | ORAL | 0 refills | Status: AC

## 2022-06-26 MED ORDER — SODIUM CHLORIDE 0.9 % IR SOLN
Status: DC | PRN
  Administered 2022-06-26: 3000 mL via INTRAVESICAL

## 2022-06-26 MED ORDER — ALFUZOSIN HCL ER 10 MG PO TB24: 10.0000 mg | ORAL_TABLET | Freq: Every day | ORAL | 0 refills | Status: DC

## 2022-06-26 MED ORDER — PROPOFOL 10 MG/ML IV BOLUS
INTRAVENOUS | Status: DC | PRN
  Administered 2022-06-26: 180 mg via INTRAVENOUS
  Administered 2022-06-26: 20 mg via INTRAVENOUS

## 2022-06-26 MED ORDER — MEPERIDINE HCL 50 MG/ML IJ SOLN: 6.2500 mg | INTRAMUSCULAR | Status: DC | PRN

## 2022-06-26 MED ORDER — FENTANYL CITRATE PF 50 MCG/ML IJ SOSY
PREFILLED_SYRINGE | INTRAMUSCULAR | Status: AC
  Filled 2022-06-26: qty 1

## 2022-06-26 SURGICAL SUPPLY — 28 items
BAG DRAIN URO TABLE W/ADPT NS (BAG) ×1 IMPLANT
BAG DRN 8 ADPR NS SKTRN CSTL (BAG) ×1
BAG HAMPER (MISCELLANEOUS) ×1 IMPLANT
CATH INTERMIT  6FR 70CM (CATHETERS) ×1 IMPLANT
CLOTH BEACON ORANGE TIMEOUT ST (SAFETY) ×1 IMPLANT
DECANTER SPIKE VIAL GLASS SM (MISCELLANEOUS) ×1 IMPLANT
EXTRACTOR STONE NITINOL NGAGE (UROLOGICAL SUPPLIES) IMPLANT
GLOVE BIO SURGEON STRL SZ8 (GLOVE) ×1 IMPLANT
GLOVE BIOGEL PI IND STRL 7.0 (GLOVE) ×2 IMPLANT
GOWN STRL REUS W/TWL LRG LVL3 (GOWN DISPOSABLE) ×1 IMPLANT
GOWN STRL REUS W/TWL XL LVL3 (GOWN DISPOSABLE) ×1 IMPLANT
GUIDEWIRE STR DUAL SENSOR (WIRE) ×1 IMPLANT
GUIDEWIRE STR ZIPWIRE 035X150 (MISCELLANEOUS) ×1 IMPLANT
IV NS IRRIG 3000ML ARTHROMATIC (IV SOLUTION) ×2 IMPLANT
KIT TURNOVER CYSTO (KITS) ×1 IMPLANT
MANIFOLD NEPTUNE II (INSTRUMENTS) ×1 IMPLANT
PACK CYSTO (CUSTOM PROCEDURE TRAY) ×1 IMPLANT
PAD ARMBOARD 7.5X6 YLW CONV (MISCELLANEOUS) ×1 IMPLANT
SHEATH NAVIGATOR HD 11/13X36 (SHEATH) IMPLANT
SHEATH URETERAL 12FRX35CM (MISCELLANEOUS) IMPLANT
STENT URET 6FRX24 CONTOUR (STENTS) IMPLANT
STENT URET 6FRX26 CONTOUR (STENTS) IMPLANT
SYR 10ML LL (SYRINGE) ×1 IMPLANT
SYR CONTROL 10ML LL (SYRINGE) ×1 IMPLANT
TOWEL OR 17X26 4PK STRL BLUE (TOWEL DISPOSABLE) ×1 IMPLANT
TRACTIP FLEXIVA PULS ID 200XHI (Laser) IMPLANT
TRACTIP FLEXIVA PULSE ID 200 (Laser) ×1
WATER STERILE IRR 500ML POUR (IV SOLUTION) ×1 IMPLANT

## 2022-06-26 NOTE — Anesthesia Postprocedure Evaluation (Signed)
Anesthesia Post Note  Patient: Sujey D Morones  Procedure(s) Performed: CYSTOSCOPY WITH RETROGRADE PYELOGRAM, URETEROSCOPY AND STENT PLACEMENT (Left: Bladder) HOLMIUM LASER APPLICATION (Left: Bladder)  Patient location during evaluation: Phase II Anesthesia Type: General Level of consciousness: awake and alert and oriented Pain management: pain level controlled Vital Signs Assessment: post-procedure vital signs reviewed and stable Respiratory status: spontaneous breathing, nonlabored ventilation and respiratory function stable Cardiovascular status: blood pressure returned to baseline and stable Postop Assessment: no apparent nausea or vomiting Anesthetic complications: no  No notable events documented.   Last Vitals:  Vitals:   06/26/22 0915 06/26/22 0944  BP:  (!) 142/88  Pulse: 100 93  Resp: 15 18  Temp:  (!) 36.4 C  SpO2: 93% 93%    Last Pain:  Vitals:   06/26/22 0944  TempSrc: Oral  PainSc: 6                  Keimon Basaldua C Elinore Shults

## 2022-06-26 NOTE — Op Note (Signed)
Preoperative diagnosis: Left ureteral stone  Postoperative diagnosis: Same  Procedure: 1 cystoscopy 2. Left retrograde pyelography 3.  Intraoperative fluoroscopy, under one hour, with interpretation 4.  Left ureteroscopic stone manipulation with laser lithotripsy 5.  Left 6 x 26 JJ stent placement  Attending: Rosie Fate  Anesthesia: General  Estimated blood loss: None  Drains: Left 6 x 26 JJ ureteral stent without tether  Specimens: none  Antibiotics: Ancef  Findings: left lower pole stone. We were unable to access the stone due to the acute angle of the lower pole. No hydronephrosis. No masses/lesions in the bladder. Ureteral orifices in normal anatomic location.  Indications: Patient is a 52 year old female with a history of left renal stone and who has persistent left flank pain.  After discussing treatment options, she decided proceed with left ureteroscopic stone manipulation.  Procedure in detail: The patient was brought to the operating room and a brief timeout was done to ensure correct patient, correct procedure, correct site.  General anesthesia was administered patient was placed in dorsal lithotomy position.  Her genitalia was then prepped and draped in usual sterile fashion.  A rigid 8 French cystoscope was passed in the urethra and the bladder.  Bladder was inspected free masses or lesions.  the ureteral orifices were in the normal orthotopic locations.  a 6 french ureteral catheter was then instilled into the left ureteral orifice.  a gentle retrograde was obtained and findings noted above.  we then placed a zip wire through the ureteral catheter and advanced up to the renal pelvis.  we then removed the cystoscope and cannulated the left ureteral orifice with a semirigid ureteroscope.  No stone was found in the ureter. Once we reached the UPJ a sensor wire was advanced in to the renal pelvis. We then removed the ureteroscope and advanced am 12/14 x 38cm access sheath  up to the renal pelvis. We then used the flexible ureteroscope to perform nephroscopy. We encountered the stone in the lower pole.   We fragmented the upper portion of the stone but we were unable to reach the remainder of the stone due to the acute angle of the lower pole.  We then removed the access sheath under direct vision and noted no injury to the ureter. We then placed a 6 x 26 double-j ureteral stent over the original zip wire.  We then removed the wire and good coil was noted in the the renal pelvis under fluoroscopy and the bladder under direct vision. the bladder was then drained and this concluded the procedure which was well tolerated by patient.  Complications: None  Condition: Stable, extubated, transferred to PACU  Plan: Patient is to be discharged home as to follow-up in 2 weeks for ESWL

## 2022-06-26 NOTE — Anesthesia Preprocedure Evaluation (Signed)
Anesthesia Evaluation  Patient identified by MRN, date of birth, ID band Patient awake    Reviewed: Allergy & Precautions, H&P , NPO status , Patient's Chart, lab work & pertinent test results  Airway Mallampati: II  TM Distance: >3 FB Neck ROM: Full    Dental no notable dental hx. (+) Dental Advisory Given, Missing   Pulmonary neg pulmonary ROS, Patient abstained from smoking., former smoker   Pulmonary exam normal breath sounds clear to auscultation       Cardiovascular Exercise Tolerance: Good hypertension, Pt. on medications Normal cardiovascular exam Rhythm:Regular Rate:Normal     Neuro/Psych  PSYCHIATRIC DISORDERS Anxiety Depression Bipolar Disorder   negative neurological ROS     GI/Hepatic negative GI ROS, Neg liver ROS,,,  Endo/Other  Hypothyroidism    Renal/GU Renal InsufficiencyRenal disease  negative genitourinary   Musculoskeletal negative musculoskeletal ROS (+)    Abdominal   Peds negative pediatric ROS (+)  Hematology negative hematology ROS (+)   Anesthesia Other Findings   Reproductive/Obstetrics negative OB ROS                             Anesthesia Physical Anesthesia Plan  ASA: 2  Anesthesia Plan: General   Post-op Pain Management:    Induction: Intravenous  PONV Risk Score and Plan: 4 or greater and Ondansetron, Dexamethasone and Midazolam  Airway Management Planned: LMA  Additional Equipment:   Intra-op Plan:   Post-operative Plan: Extubation in OR  Informed Consent: I have reviewed the patients History and Physical, chart, labs and discussed the procedure including the risks, benefits and alternatives for the proposed anesthesia with the patient or authorized representative who has indicated his/her understanding and acceptance.     Dental advisory given  Plan Discussed with: CRNA and Surgeon  Anesthesia Plan Comments:        Anesthesia  Quick Evaluation

## 2022-06-26 NOTE — Anesthesia Procedure Notes (Signed)
Procedure Name: LMA Insertion Date/Time: 06/26/2022 7:41 AM  Performed by: Vista Deck, CRNAPre-anesthesia Checklist: Patient identified, Patient being monitored, Timeout performed, Emergency Drugs available and Suction available Patient Re-evaluated:Patient Re-evaluated prior to induction Oxygen Delivery Method: Circle System Utilized Preoxygenation: Pre-oxygenation with 100% oxygen Induction Type: IV induction Ventilation: Mask ventilation without difficulty LMA: LMA inserted LMA Size: 4.0 Number of attempts: 1 Placement Confirmation: positive ETCO2 and breath sounds checked- equal and bilateral Tube secured with: Tape (paper tape) Dental Injury: Teeth and Oropharynx as per pre-operative assessment

## 2022-06-26 NOTE — Transfer of Care (Signed)
Immediate Anesthesia Transfer of Care Note  Patient: [name]  Procedure(s) Performed: CYSTOSCOPY WITH RETROGRADE PYELOGRAM, URETEROSCOPY AND STENT PLACEMENT (Left: Bladder) HOLMIUM LASER APPLICATION (Left: Bladder)  Patient Location: PACU  Anesthesia Type:General  Level of Consciousness: drowsy and patient cooperative  Airway & Oxygen Therapy: Patient Spontanous Breathing and Patient connected to nasal cannula oxygen  Post-op Assessment: Report given to RN and Post -op Vital signs reviewed and stable  Post vital signs: Reviewed and stable  Last Vitals:  Vitals Value Taken Time  BP 139/76 0828  Temp 98.1 0828  Pulse 107 0828  Resp 16 0828  SpO2 96 0828    Last Pain:  Vitals:   06/26/22 0639  TempSrc: Oral  PainSc: 8          Complications: No notable events documented.

## 2022-06-26 NOTE — Interval H&P Note (Signed)
History and Physical Interval Note:  06/26/2022 7:24 AM  [name]  has presented today for surgery, with the diagnosis of left renal stone.  The various methods of treatment have been discussed with the patient and family. After consideration of risks, benefits and other options for treatment, the patient has consented to  Procedure(s): CYSTOSCOPY WITH RETROGRADE PYELOGRAM, URETEROSCOPY AND STENT PLACEMENT (Left) HOLMIUM LASER APPLICATION (Left) as a surgical intervention.  The patient's history has been reviewed, patient examined, no change in status, stable for surgery.  I have reviewed the patient's chart and labs.  Questions were answered to the patient's satisfaction.     Nicolette Bang

## 2022-06-26 NOTE — Progress Notes (Signed)
Upon discharge this RN as well as Benjamine Mola, RN confirmed pt had assistance all day. Pt ride, sister, states she will be with her as well as another sister with pt all day. This RN informed pt and caregiver that she is very sleepy and not to take any more pain meds until she is more alert as well as not to take any more xanax today. Pt and caregiver verbalize understanding.

## 2022-06-26 NOTE — Progress Notes (Signed)
Pt asking for pain meds, Dr. Charna Elizabeth at bedside. PT states fentanyl doesn't work, only dilaudid. Pt encouraged to try one dose of fentanyl first. PT verbalizes agreement.

## 2022-07-01 ENCOUNTER — Encounter (HOSPITAL_COMMUNITY): Payer: Self-pay | Admitting: Urology

## 2022-07-01 ENCOUNTER — Ambulatory Visit (INDEPENDENT_AMBULATORY_CARE_PROVIDER_SITE_OTHER): Payer: Medicare HMO | Admitting: Urology

## 2022-07-01 VITALS — BP 105/69 | HR 96

## 2022-07-01 DIAGNOSIS — N200 Calculus of kidney: Secondary | ICD-10-CM | POA: Diagnosis not present

## 2022-07-01 MED ORDER — OXYCODONE-ACETAMINOPHEN 5-325 MG PO TABS: 1.0000 | ORAL_TABLET | ORAL | 0 refills | Status: DC | PRN

## 2022-07-01 NOTE — Progress Notes (Signed)
07/01/2022 1:50 PM   MARIANN PALO Oct 11, 1970 [id number]  Referring provider: Lindell Spar, MD [address] [place],  [place] [address]  Followup nephrolithiasis   HPI: Ms Rosello is a 52yo here for followup for nephrolithiasis. She underwent ureteroscopy but due to the acute angle of the lower pole. She currently has a left ureteral stent in place. She denies nay worsening LUTS. No other complaints today   PMH: Past Medical History:  Diagnosis Date   Anxiety    Chronic kidney disease    CKD (chronic kidney disease) stage 3, GFR 30-59 ml/min (HCC) 03/15/2019   Depression    Diarrhea 05/04/2017   Essential hypertension, benign 03/15/2019   History of kidney stones    Hypertension    Hypothyroidism    Hypothyroidism, adult 03/15/2019   Kidney stones    LGSIL on Pap smear of cervix 08/17/2020   Needs colpo per [facility] immediate risk is 4.3 for CIN 3+risk    Obesity (BMI 30-39.9) 03/15/2019   Renal disorder    Vitamin D deficiency disease 03/15/2019    Surgical History: Past Surgical History:  Procedure Laterality Date   BIOPSY  06/05/2017   Procedure: BIOPSY;  Surgeon: Rogene Houston, MD;  [facility];  Service: Endoscopy;;  colon   CHOLECYSTECTOMY     COLONOSCOPY N/A 06/05/2017   Procedure: COLONOSCOPY;  Surgeon: Rogene Houston, MD;  Location: [facility];  Service: Endoscopy;  Laterality: N/A;  8:30   CYSTOSCOPY WITH RETROGRADE PYELOGRAM, URETEROSCOPY AND STENT PLACEMENT Left 10/04/2020   Procedure: CYSTOSCOPY WITH LEFT RETROGRADE PYELOGRAM, DIAGNOSTIC LEFT URETEROSCOPY AND LEFT URETEREAL STENT PLACEMENT;  Surgeon: Cleon Gustin, MD;  Location: [facility];  Service: Urology;  Laterality: Left;   CYSTOSCOPY WITH RETROGRADE PYELOGRAM, URETEROSCOPY AND STENT PLACEMENT Left 10/16/2020   Procedure: CYSTOSCOPY WITH LEFT RETROGRADE PYELOGRAM, LEFT URETEROSCOPY  WITH LASER AND LEFT URETERAL STENT EXCHANGE;  Surgeon: Cleon Gustin, MD;  Location: [facility];   Service: Urology;  Laterality: Left;   CYSTOSCOPY WITH RETROGRADE PYELOGRAM, URETEROSCOPY AND STENT PLACEMENT Bilateral 02/17/2022   Procedure: CYSTOSCOPY WITH RETROGRADE PYELOGRAM, URETEROSCOPY AND STENT PLACEMENT;  Surgeon: Cleon Gustin, MD;  Location: [facility];  Service: Urology;  Laterality: Bilateral;   HOLMIUM LASER APPLICATION Left 7/68/1157   Procedure: HOLMIUM LASER APPLICATION;  Surgeon: Cleon Gustin, MD;  Location: [facility];  Service: Urology;  Laterality: Left;   KIDNEY STONE SURGERY     kidney stones     LASER ABLATION CONDOLAMATA N/A 10/31/2020   Procedure: LASER ABLATION OF THE CERVIX;  Surgeon: Florian Buff, MD;  Location: [facility];  Service: Gynecology;  Laterality: N/A;    Home Medications:  Allergies as of 07/01/2022       Reactions   Morphine And Related Itching, Hives   Sulfa Antibiotics Swelling   Tape Itching, Other (See Comments)   Surgical         Medication List        Accurate as of July 01, 2022  1:50 PM. If you have any questions, ask your nurse or doctor.          alfuzosin 10 MG 24 hr tablet Commonly known as: Uroxatral Take 1 tablet (10 mg total) by mouth daily with breakfast.   ALPRAZolam 1 MG tablet Commonly known as: XANAX Take 1 mg by mouth 3 (three) times daily as needed for anxiety.   amLODipine 5 MG tablet Commonly known as: NORVASC Take 1 tablet (5  mg total) by mouth in the morning and at bedtime.   ARIPiprazole 5 MG tablet Commonly known as: ABILIFY Take 5 mg by mouth at bedtime.   atorvastatin 20 MG tablet Commonly known as: LIPITOR Take 1 tablet (20 mg total) by mouth daily.   carvedilol 25 MG tablet Commonly known as: COREG Take 25 mg by mouth 2 (two) times daily.   chlorthalidone 25 MG tablet Commonly known as: HYGROTON Take 12.5 mg by mouth daily.   doxepin 75 MG capsule Commonly known as: SINEQUAN Take 225 mg by mouth at bedtime.   Ingrezza 60 MG capsule Generic drug: valbenazine Take 60 mg by  mouth at bedtime.   lamoTRIgine 200 MG tablet Commonly known as: LAMICTAL Take 200 mg by mouth 2 (two) times daily.   losartan 25 MG tablet Commonly known as: COZAAR Take 25 mg by mouth in the morning.   medroxyPROGESTERone 150 MG/ML injection Commonly known as: DEPO-PROVERA Inject 1 mL (150 mg total) into the muscle every 3 (three) months.   nitrofurantoin (macrocrystal-monohydrate) 100 MG capsule Commonly known as: Macrobid Take 1 capsule (100 mg total) by mouth 2 (two) times daily for 7 days.   nitrofurantoin 50 MG capsule Commonly known as: MACRODANTIN Take 1 capsule (50 mg total) by mouth at bedtime.   ondansetron 4 MG tablet Commonly known as: Zofran Take 1 tablet (4 mg total) by mouth daily as needed for nausea or vomiting.   oxyCODONE-acetaminophen 5-325 MG tablet Commonly known as: Percocet Take 1 tablet by mouth every 4 (four) hours as needed.   potassium citrate 10 MEQ (1080 MG) SR tablet Commonly known as: UROCIT-K Take 20 mEq by mouth in the morning and at bedtime.   QUEtiapine 400 MG 24 hr tablet Commonly known as: SEROQUEL XR Take 400 mg by mouth at bedtime.   Vitamin D3 125 MCG (5000 UT) Tabs Take 10,000 Units by mouth daily.        Allergies:  Allergies  Allergen Reactions   Morphine And Related Itching and Hives   Sulfa Antibiotics Swelling   Tape Itching and Other (See Comments)    Surgical     Family History: Family History  Problem Relation Age of Onset   Cancer Paternal Grandmother    Heart failure Paternal Grandmother    Cancer Maternal Grandmother    Cancer Father    Heart failure Father    Lung cancer Mother    Colon cancer Neg Hx     Social History:  reports that she quit smoking about 4 months ago. Her smoking use included cigarettes. She has a 10.00 pack-year smoking history. She has never used smokeless tobacco. She reports that she does not drink alcohol and does not use drugs.  ROS: All other review of systems were  reviewed and are negative except what is noted above in HPI  Physical Exam: BP 105/69   Pulse 96   LMP  (LMP Unknown)   Constitutional:  Alert and oriented, No acute distress. HEENT: [place] AT, moist mucus membranes.  Trachea midline, no masses. Cardiovascular: No clubbing, cyanosis, or edema. Respiratory: Normal respiratory effort, no increased work of breathing. GI: Abdomen is soft, nontender, nondistended, no abdominal masses GU: No CVA tenderness.  Lymph: No cervical or inguinal lymphadenopathy. Skin: No rashes, bruises or suspicious lesions. Neurologic: Grossly intact, no focal deficits, moving all 4 extremities. Psychiatric: Normal mood and affect.  Laboratory Data: Lab Results  Component Value Date   WBC 7.2 10/30/2020   HGB 12.6 10/30/2020  HCT 39.8 10/30/2020   MCV 89.4 10/30/2020   PLT 410 (H) 10/30/2020    Lab Results  Component Value Date   CREATININE 1.25 (H) 06/25/2022    No results found for: "PSA"  No results found for: "TESTOSTERONE"  Lab Results  Component Value Date   HGBA1C 5.2 09/04/2021    Urinalysis    Component Value Date/Time   COLORURINE YELLOW 10/30/2020 1355   APPEARANCEUR Cloudy (A) 06/03/2022 1431   LABSPEC 1.025 10/30/2020 1355   PHURINE 5.0 10/30/2020 1355   GLUCOSEU Negative 06/03/2022 1431   HGBUR NEGATIVE 10/30/2020 1355   BILIRUBINUR Negative 06/03/2022 1431   KETONESUR NEGATIVE 10/30/2020 1355   PROTEINUR 1+ (A) 06/03/2022 1431   PROTEINUR 30 (A) 10/30/2020 1355   UROBILINOGEN 0.2 01/17/2009 0124   NITRITE Positive (A) 06/03/2022 1431   NITRITE NEGATIVE 10/30/2020 1355   LEUKOCYTESUR 1+ (A) 06/03/2022 1431   LEUKOCYTESUR SMALL (A) 10/30/2020 1355    Lab Results  Component Value Date   LABMICR See below: 06/03/2022   WBCUA 0-5 06/03/2022   LABEPIT 0-10 06/03/2022   MUCUS Present 09/23/2021   BACTERIA None seen 06/03/2022    Pertinent Imaging:  Results for orders placed during the hospital encounter of  08/28/20  Abdomen 1 view (KUB)  Narrative CLINICAL DATA:  Nephrolithiasis  EXAM: ABDOMEN - 1 VIEW  COMPARISON:  CT 08/21/2020  FINDINGS: The bowel gas pattern is normal. 8 mm stone again seen projecting over the left renal shadow. No right sided renal calculi, which is partially obscured by overlying bowel gas and stool. Moderate volume stool throughout the colon. No acute osseous findings. Other significant radiographic abnormality are seen.  IMPRESSION: 8 mm stone projecting over the left renal shadow, similar to prior exam.   Electronically Signed By: Davina Poke D.O. On: 08/29/2020 10:41  No results found for this or any previous visit.  No results found for this or any previous visit.  No results found for this or any previous visit.  Results for orders placed during the hospital encounter of 05/21/22  Ultrasound renal complete  Narrative CLINICAL DATA:  History of stones.  Follow-up.  EXAM: RENAL / URINARY TRACT ULTRASOUND COMPLETE  COMPARISON:  August 30, 2021  FINDINGS: Right Kidney:  Renal measurements: 10 x 5 x 4 cm = volume: 106 mL. Echogenicity within normal limits. No mass or hydronephrosis visualized.  Left Kidney:  Renal measurements: 7.6 x 4.1 x 3.9 cm = volume: 63 mL. Contains 11 mm nonobstructive stone in the lower pole.  Bladder:  Appears normal for degree of bladder distention.  Other:  The bladder is decompressed and not assessed on today's study.  IMPRESSION: 1. 11 mm nonobstructive stone in the lower pole of the left kidney. 2. No other abnormalities. The bladder was not assessed due to lack of distention.   Electronically Signed By: Dorise Bullion III M.D. On: 05/21/2022 16:40  No valid procedures specified. Results for orders placed during the hospital encounter of 09/09/21  CT HEMATURIA WORKUP  Narrative CLINICAL DATA:  Gross hematuria, chronic cystitis  EXAM: CT ABDOMEN AND PELVIS WITHOUT AND WITH  CONTRAST  TECHNIQUE: Multidetector CT imaging of the abdomen and pelvis was performed following the standard protocol before and following the bolus administration of intravenous contrast.  RADIATION DOSE REDUCTION: This exam was performed according to the departmental dose-optimization program which includes automated exposure control, adjustment of the mA and/or kV according to patient size and/or use of iterative reconstruction technique.  CONTRAST:  62m OMNIPAQUE IOHEXOL 300 MG/ML  SOLN  COMPARISON:  12/17/2020  FINDINGS: Lower chest: No acute abnormality. Bandlike scarring of the bilateral lung bases.  Hepatobiliary: No focal liver abnormality is seen. Status post cholecystectomy. No biliary dilatation.  Pancreas: Unremarkable. No pancreatic ductal dilatation or surrounding inflammatory changes.  Spleen: Normal in size without significant abnormality.  Adrenals/Urinary Tract: Adrenal glands are unremarkable. Multiple small bilateral nonobstructive renal calculi. No ureteral calculi or hydronephrosis. Relatively atrophic, lobular appearance of the left kidney. No suspicious mass or contrast enhancement. No urinary tract filling defect on delayed phase imaging. Bladder is unremarkable.  Stomach/Bowel: Stomach is within normal limits. Appendix appears normal. No evidence of bowel wall thickening, distention, or inflammatory changes.  Vascular/Lymphatic: No significant vascular findings are present. No enlarged abdominal or pelvic lymph nodes.  Reproductive: No mass or other significant abnormality.  Other: No abdominal wall hernia or abnormality. No ascites.  Musculoskeletal: No acute or significant osseous findings.  IMPRESSION: 1. Multiple small bilateral nonobstructive renal calculi. No ureteral calculi or hydronephrosis. 2. Relatively atrophic, lobular appearance of the left kidney, in keeping with prior obstructive, infectious, or ischemic insult. 3. No  suspicious mass or contrast enhancement. No urinary tract filling defect on delayed phase imaging. 4. Normal bladder.   Electronically Signed By: ADelanna AhmadiM.D. On: 09/09/2021 10:55  Results for orders placed in visit on 08/21/20  CT RENAL STONE STUDY  Narrative CLINICAL DATA:  Left flank pain for 3 months. Hematuria. Nephrolithiasis.  EXAM: CT ABDOMEN AND PELVIS WITHOUT CONTRAST  TECHNIQUE: Multidetector CT imaging of the abdomen and pelvis was performed following the standard protocol without IV contrast.  COMPARISON:  03/21/2019  FINDINGS: Lower chest: No acute findings.  Hepatobiliary: No mass visualized on this unenhanced exam. Prior cholecystectomy. No evidence of biliary obstruction.  Pancreas: No mass or inflammatory process visualized on this unenhanced exam.  Spleen:  Within normal limits in size.  Adrenals/Urinary tract: Moderate diffuse left renal parenchymal atrophy and scarring is again seen. A few renal calculi are seen bilaterally, largest in the lower pole of the left kidney measuring 8 mm. No evidence of ureteral calculi or hydronephrosis. Unremarkable unopacified urinary bladder.  Stomach/Bowel: No evidence of obstruction, inflammatory process, or abnormal fluid collections. Normal appendix visualized.  Vascular/Lymphatic: No pathologically enlarged lymph nodes identified. No evidence of abdominal aortic aneurysm.  Reproductive:  No mass or other significant abnormality.  Other:  None.  Musculoskeletal:  No suspicious bone lesions identified.  IMPRESSION: Bilateral nephrolithiasis. No evidence of ureteral calculi, hydronephrosis, or other acute findings.  Chronic diffuse left renal parenchymal atrophy and scarring.   Electronically Signed By: JMarlaine HindM.D. On: 08/21/2020 16:45   Assessment & Plan:    1. Nephrolithiasis -We discussed the management of kidney stones. These options include observation, ureteroscopy,  shockwave lithotripsy (ESWL) and percutaneous nephrolithotomy (PCNL). We discussed which options are relevant to the patient's stone(s). We discussed the natural history of kidney stones as well as the complications of untreated stones and the impact on quality of life without treatment as well as with each of the above listed treatments. We also discussed the efficacy of each treatment in its ability to clear the stone burden. With any of these management options I discussed the signs and symptoms of infection and the need for emergent treatment should these be experienced. For each option we discussed the ability of each procedure to clear the patient of their stone burden.   For observation I described the risks which include  but are not limited to silent renal damage, life-threatening infection, need for emergent surgery, failure to pass stone and pain.   For ureteroscopy I described the risks which include bleeding, infection, damage to contiguous structures, positioning injury, ureteral stricture, ureteral avulsion, ureteral injury, need for prolonged ureteral stent, inability to perform ureteroscopy, need for an interval procedure, inability to clear stone burden, stent discomfort/pain, heart attack, stroke, pulmonary embolus and the inherent risks with general anesthesia.   For shockwave lithotripsy I described the risks which include arrhythmia, kidney contusion, kidney hemorrhage, need for transfusion, pain, inability to adequately break up stone, inability to pass stone fragments, Steinstrasse, infection associated with obstructing stones, need for alternate surgical procedure, need for repeat shockwave lithotripsy, MI, CVA, PE and the inherent risks with anesthesia/conscious sedation.   For PCNL I described the risks including positioning injury, pneumothorax, hydrothorax, need for chest tube, inability to clear stone burden, renal laceration, arterial venous fistula or malformation, need for  embolization of kidney, loss of kidney or renal function, need for repeat procedure, need for prolonged nephrostomy tube, ureteral avulsion, MI, CVA, PE and the inherent risks of general anesthesia.   - The patient would like to proceed with left PCNL - Urinalysis, Routine w reflex microscopic   No follow-ups on file.  Nicolette Bang, MD  [facility] [place]

## 2022-07-01 NOTE — Patient Instructions (Signed)
Percutaneous Nephrolithotomy Percutaneous nephrolithotomy is a procedure to remove kidney stones. Kidney stones are deposits that form inside your kidneys and can cause pain. You may need this procedure if: You have large kidney stones. Kidney stones that are bigger than 2 cm (0.78 inch) wide may require this procedure. Your kidney stones are oddly shaped. Other treatments have not been successful in helping the kidney stones to pass. You have developed an infection due to the kidney stones. Tell a health care provider about: Any allergies you have. All medicines you are taking, including vitamins, herbs, eye drops, creams, and over-the-counter medicines. Any problems you or family members have had with anesthetic medicines. Any blood disorders you have. Any surgeries you have had. Any medical conditions you have. Whether you are pregnant or may be pregnant. Whether you use any tobacco products, including cigarettes, chewing tobacco, or e-cigarettes. What are the risks? Generally, this is a safe procedure. However, problems may occur, including: Infection. Bleeding. This may include blood in your urine. Allergic reactions to medicines. Damage to other structures or organs. Kidney damage. Holes in the kidney. These often heal on their own. Numbness or tingling in the affected area. Inability to remove all the stones. You may need a different procedure to complete treatment. What happens before the procedure? Staying hydrated Follow instructions from your health care provider about hydration, which may include: Up to 2 hours before the procedure - you may continue to drink clear liquids, such as water, clear fruit juice, black coffee, and plain tea.  Eating and drinking restrictions Follow instructions from your health care provider about eating and drinking, which may include: 8 hours before the procedure - stop eating heavy meals or foods, such as meat, fried foods, or fatty foods. 6  hours before the procedure - stop eating light meals or foods, such as toast or cereal. 6 hours before the procedure - stop drinking milk or drinks that contain milk. 2 hours before the procedure - stop drinking clear liquids. Medicines Ask your health care provider about: Changing or stopping your regular medicines. This is especially important if you are taking diabetes medicines or blood thinners. Taking medicines such as aspirin and ibuprofen. These medicines can thin your blood. Do not take these medicines unless your health care provider tells you to take them. Taking over-the-counter medicines, vitamins, herbs, and supplements. Tests You may have tests, including: Blood tests. Urine tests. Tests to check how your heart is working. Imaging studies. These are used to identify: The size and number (stone burden) of the kidney stones. The position of the kidney stones. General instructions Plan to have someone take you home from the hospital or clinic. Plan to have a responsible adult care for you for at least 24 hours after you leave the hospital or clinic. This is important. Ask your health care provider how your surgical site will be marked or identified. Ask your health care provider what steps will be taken to help prevent infection. These may include: Removing hair at the surgery site. Washing skin with a germ-killing soap. Taking antibiotic medicine. What happens during the procedure?  An IV will be inserted into one of your veins. The site of the procedure will be marked. You will be given one or more of the following: A medicine to help you relax (sedative). A medicine to numb the area (local anesthetic). A medicine to make you fall asleep (general anesthetic). A medicine that is injected into your spine to numb the area below   and slightly above the injection site (spinal anesthetic). A medicine that is injected into an area of your body to numb everything below the  injection site (regional anesthetic). A thin tube (urinary catheter) will be put in your bladder to drain urine during and after the procedure. Your surgeon will make a small cut (incision) in your lower back. A tube will be inserted through the incision into your kidney. Each kidney stone will be removed through this tube. Larger stones may need to be broken up with a high-intensity light beam (laser) or other tools. After all of the stones have been removed, your health care provider may put in tubes to drain your bladder. Based on your condition: An internal tube, called a stent, may be put in your ureter. This will help drain urine from your kidney to your bladder. A surgical drain (nephrostomy tube) may be put in your kidney. The tube comes out through the incision in your lower back. This will help to drain urine or any fluid that builds up while your kidney heals. Part of the incision may be closed with stitches (sutures). A bandage (dressing) will be placed over the incision area. The procedure may vary among health care providers and hospitals. What happens after the procedure? Your blood pressure, heart rate, breathing rate, and blood oxygen level will be monitored until you leave the hospital or clinic. You may be given medicine for pain. You will be shown how to do breathing exercises, such as coughing and breathing deeply. These will help to prevent pneumonia. You will be encouraged to walk. Walking helps to prevent blood clots. Your stent and urinary catheter will be removed after 1-2 days if there is only a small amount of blood in your urine. You will be taught how to care for the catheter or nephrostomy tube, if you have them. Do not drive for 24 hours if you were given a sedative during your procedure. Summary Percutaneous nephrolithotomy is a procedure to remove kidney stones. Ask your health care provider about changing or stopping your regular medicines. Before surgery,  follow instructions from your health care provider about eating and drinking. Plan to have someone take you home from the hospital or clinic. This information is not intended to replace advice given to you by your health care provider. Make sure you discuss any questions you have with your health care provider. Document Revised: 01/20/2021 Document Reviewed: 01/21/2021 Elsevier Patient Education  2023 [employer]vier Inc.  

## 2022-07-02 LAB — URINALYSIS, ROUTINE W REFLEX MICROSCOPIC
Bilirubin, UA: NEGATIVE
Glucose, UA: NEGATIVE
Ketones, UA: NEGATIVE
Nitrite, UA: NEGATIVE
Specific Gravity, UA: 1.02 (ref 1.005–1.030)
Urobilinogen, Ur: 1 mg/dL (ref 0.2–1.0)
pH, UA: 7 (ref 5.0–7.5)

## 2022-07-02 LAB — MICROSCOPIC EXAMINATION
RBC, Urine: >30 /hpf — AB (ref 0–2)
WBC, UA: >30 /hpf — AB (ref 0–5)

## 2022-07-04 ENCOUNTER — Other Ambulatory Visit: Payer: Self-pay

## 2022-07-04 DIAGNOSIS — N200 Calculus of kidney: Secondary | ICD-10-CM

## 2022-07-09 NOTE — Telephone Encounter (Signed)
Hoyle Sauer,  Ms. Kaeding will need a pre op visit please.  Thanks, Producer, television/film/video

## 2022-07-09 NOTE — Procedures (Signed)
ELECTROENCEPHALOGRAM REPORT  Dates of Recording: 06/23/2022 9:03AM to 06/24/2022 11:19AM  Patient's Name: [name] MRN: [id number] Date of Birth: [birth year]  Referring Provider: Dr. Ellouise Newer  Procedure: 26-hour ambulatory video EEG  History: This is a 52 year old woman with episodes of confusion/memory loss and frequent falls. EEG for classification.   Medications: Lamictal, Abilify, Ingrezza, Seroquel  Technical Summary: This is a 26-hour multichannel digital video EEG recording measured by the international 10-20 system with electrodes applied with paste and impedances below 5000 ohms performed as portable with EKG monitoring.  The digital EEG was referentially recorded, reformatted, and digitally filtered in a variety of bipolar and referential montages for optimal display.    DESCRIPTION OF RECORDING: During maximal wakefulness, the background activity consisted of a symmetric 9 Hz posterior dominant rhythm which was reactive to eye opening.  There were no epileptiform discharges or focal slowing seen in wakefulness.  During the recording, the patient progresses through wakefulness, drowsiness, and Stage 2 sleep.  Again, there were no epileptiform discharges seen.  Events: Push button events appear accidental. Patient did not complete diary.  There were no electrographic seizures seen.  EKG lead was unremarkable.  IMPRESSION: This 26-hour ambulatory video EEG study is normal.    CLINICAL CORRELATION: A normal EEG does not exclude a clinical diagnosis of epilepsy. Typical events were not captured. If further clinical questions remain, inpatient video EEG monitoring may be helpful.   Ellouise Newer, M.D.

## 2022-07-09 NOTE — Patient Instructions (Signed)
Visit Information  Thank you for taking time to visit with me today. Please don't hesitate to contact me if I can be of assistance to you.   Following are the goals we discussed today:   Goals Addressed               This Visit's Progress     Patient Stated     Falls [facility]) (pt-stated)   Not on track     Care Coordination Interventions: Provided written and verbal education re: potential causes of falls and Fall prevention strategies Advised patient of importance of notifying provider of falls Assessed for signs and symptoms of orthostatic hypotension Assessed for falls since last encounter Reviewed to get up slowly and pump feet when getting up.  Encouraged to drink adequate amounts of fluid to help with hypotension      improve sleep (THN) (pt-stated)   Not on track     Care Coordination Interventions: Evaluation of current treatment plan related to poor sleep and patient's adherence to plan as established by provider Provided education to patient re: insomnia, risks to health, CPAP use Discussed plans with patient for ongoing care management follow up and provided patient with direct contact information for care management team        Our next appointment is by telephone on 06/17/22 at 4 pm  Please call the care guide team at [phone] if you need to cancel or reschedule your appointment.   If you are experiencing a Mental Health or [place] or need someone to talk to, please call the Suicide and Crisis Lifeline: 988 call the [place] National Suicide Prevention Lifeline: 316 090 6257 or TTY: 404-716-8313 TTY 403-547-5577) to talk to a trained counselor call 1-800-273-TALK (toll free, 24 hour hotline) call the [facility] ([facility]): [phone] call 911   Patient verbalizes understanding of instructions and care plan provided today and agrees to view in [place]. Active MyChart status and patient understanding of how to access instructions and  care plan via MyChart confirmed with patient.     The patient has been provided with contact information for the care management team and has been advised to call with any health related questions or concerns.   Sheralyn Pinegar L. Lavina Hamman, RN, BSN, [place] Coordinator Office number [phone]

## 2022-07-11 ENCOUNTER — Other Ambulatory Visit: Payer: Self-pay

## 2022-07-11 DIAGNOSIS — N200 Calculus of kidney: Secondary | ICD-10-CM

## 2022-07-11 NOTE — Telephone Encounter (Signed)
Patient requesting pain medication refill prior to surgery 08/07/2022  Message sent to MD

## 2022-07-14 ENCOUNTER — Ambulatory Visit: Payer: Medicare HMO | Admitting: Neurology

## 2022-07-14 ENCOUNTER — Other Ambulatory Visit (INDEPENDENT_AMBULATORY_CARE_PROVIDER_SITE_OTHER): Payer: Medicare HMO

## 2022-07-14 ENCOUNTER — Other Ambulatory Visit: Payer: Self-pay | Admitting: Urology

## 2022-07-14 ENCOUNTER — Encounter: Payer: Self-pay | Admitting: Neurology

## 2022-07-14 VITALS — BP 112/76 | HR 88 | Ht 62.0 in | Wt 197.0 lb

## 2022-07-14 DIAGNOSIS — R296 Repeated falls: Secondary | ICD-10-CM

## 2022-07-14 DIAGNOSIS — N200 Calculus of kidney: Secondary | ICD-10-CM

## 2022-07-14 DIAGNOSIS — R292 Abnormal reflex: Secondary | ICD-10-CM

## 2022-07-14 DIAGNOSIS — R404 Transient alteration of awareness: Secondary | ICD-10-CM

## 2022-07-14 MED ORDER — OXYCODONE-ACETAMINOPHEN 5-325 MG PO TABS: 1.0000 | ORAL_TABLET | ORAL | 0 refills | Status: DC | PRN

## 2022-07-14 NOTE — Patient Instructions (Signed)
Good to see you.  Have bloodwork done for B12, copper, zinc, vitamin E  2. Schedule inpatient video EEG   3. Follow-up after tests, call for any changes

## 2022-07-14 NOTE — Progress Notes (Signed)
NEUROLOGY FOLLOW UP OFFICE NOTE  LETEISHA Jennings [id number] 1971-04-21  HISTORY OF PRESENT ILLNESS: I had the pleasure of seeing [name] in follow-up in the neurology clinic on 07/14/2022.  The patient was last seen 2 months ago for frequent falls and concern that they were seizures as she did not remember what occurs before or after a fall. She is again accompanied by her sisger [name] who helps supplement the history today.  Records and images were personally reviewed where available.  I personally reviewed brain MRI with and without contrast done 06/2022 which was motion degraded but there were no abnormalities seen. She was hyperreflexic on initial exam. MRI cervical spine with and without contrast did not show any myelopathy. There was mild to moderate disc degeneration at C4-5 and C5-6, disc bulge mildly effaces the ventral thecal sac and contacts ventral aspect of spinal cord. Her 1-hour EEG in 05/2022 was normal, she then had a 26-hour EEG in 06/2022 which was also normal, typical events not captured.   Since her last visit, they continue to report falls every few weeks. She reports a fall on 07/03/22, however [name] states she does not think it was a fall. She had a fall in January where their father had to call her aunt to help. She does not remember any of this, her aunt said she was out of it. She shows a picture of her sitting on the couch with head slumped to the right side. Her aunt said they had a hard time waking her up. [name] was present for this and states she would wake up a few minutes then go back down. She apparently woke up at 4:30-5am, fed the cats and took her morning medications, then at 7am, she went to the bathroom to urinate. [name] felt it was taking her longer than usual, when [name] checked on her she was feeding the cats again. [name] reminded her she already did that, then she said she was going to take her medications, which [name] again reminded that she already had done.  [name] is concerned she may be taking her night medications in the morning, but patient states she takes her pillpacks correctly.     History on Initial Assessment 05/14/2022: This is a 52 year old right-handed man with a history of hypertension, hypothyroidism, nephrolithiasis, CKD, anxiety, depression, presenting for frequent falls. She states she does not remember what occurs before or after a fall. Symptoms started 6-8 months ago, last fall was 2 weeks ago. She lives with her father. Her father mentioned she got up one night to use the bathroom and fell, she was awake but not alert. She does not recall going to the bathroom or him helping her up. Another time 3-4 months ago, she was out of it and slurring her speech. She had fallen several times, getting up to use the bathroom and fall, "awake but not really awake," occurring in the daytime as well. She was initially falling 4 times in a week so [name] stayed with her for 2 weeks, then all of a sudden, she was back to her old self. Symptoms then recurrent for 1-2 weeks where [name] and their aunt stayed to care for her. Speech was slurred so bad, she could not understand people around her but [name] reports she would follow instructions. One time she had urinary incontinence. [name] notes she gets really irritable during those periods then would not remember what she said. She reports her memory is bad in general.  Over the past year, she denies getting lost driving but would get disoriented for a minute then realize where she is. She denies missing medications or bill payments but forgets to make appointments. She reports it takes a lot of effort to do what she needs to, such as paperwork. Her handwriting has also changed, it starts good then goes off the line.   They deny any staring/unresponsive episodes. She denies any olfactory/gustatory hallucinations. She has numbness and burning on the left lateral thigh. It hurts to lift her left arm, no  numbness/tingling. Her left hip also bothers her. She states the pain is not from prior falls. Her feet feel cold but they are warm when touched. She has brief body twitches and developed tics which Ingrezza sometimes helps with. She denies any headaches, neck/back pain, dysarthria/dysphagia. She has a lot of dizziness. She was having a little double vision during the months she had the episodes. She has some constipation but also has times she cannot hold her BM and has incontinence. [name] would help her get cleaned up but she would not recall how she got cleaned. She usually gets 5 hours of sleep and denies any change in sleep pattern or alcohol intake during the episodes. Mood is okay, she gets anxious and cold. She takes Xanax every night with no missed doses. She is also on Lamotrigine 228m BID for mood. No family history of similar symptoms. She had a normal birth and early development.  There is no history of febrile convulsions, CNS infections such as meningitis/encephalitis, significant traumatic brain injury, neurosurgical procedures, or family history of seizures.   PAST MEDICAL HISTORY: Past Medical History:  Diagnosis Date   Anxiety    Chronic kidney disease    CKD (chronic kidney disease) stage 3, GFR 30-59 ml/min (HCC) 03/15/2019   Depression    Diarrhea 05/04/2017   Essential hypertension, benign 03/15/2019   History of kidney stones    Hypertension    Hypothyroidism    Hypothyroidism, adult 03/15/2019   Kidney stones    LGSIL on Pap smear of cervix 08/17/2020   Needs colpo per ASCCP immediate risk is 4.3 for CIN 3+risk    Obesity (BMI 30-39.9) 03/15/2019   Renal disorder    Vitamin D deficiency disease 03/15/2019    MEDICATIONS: Current Outpatient Medications on File Prior to Visit  Medication Sig Dispense Refill   alfuzosin (UROXATRAL) 10 MG 24 hr tablet Take 1 tablet (10 mg total) by mouth daily with breakfast. 30 tablet 0   ALPRAZolam (XANAX) 1 MG tablet Take 1 mg by  mouth 3 (three) times daily as needed for anxiety.      amLODipine (NORVASC) 5 MG tablet Take 1 tablet (5 mg total) by mouth in the morning and at bedtime. 60 tablet 2   ARIPiprazole (ABILIFY) 5 MG tablet Take 5 mg by mouth at bedtime.     atorvastatin (LIPITOR) 20 MG tablet Take 1 tablet (20 mg total) by mouth daily. 90 tablet 3   carvedilol (COREG) 25 MG tablet Take 25 mg by mouth 2 (two) times daily.     chlorthalidone (HYGROTON) 25 MG tablet Take 12.5 mg by mouth daily.     Cholecalciferol (VITAMIN D3) 125 MCG (5000 UT) TABS Take 10,000 Units by mouth daily.     doxepin (SINEQUAN) 75 MG capsule Take 225 mg by mouth at bedtime.      INGREZZA 60 MG capsule Take 60 mg by mouth at bedtime.     lamoTRIgine (LAMICTAL)  200 MG tablet Take 200 mg by mouth 2 (two) times daily.     losartan (COZAAR) 25 MG tablet Take 25 mg by mouth in the morning.     nitrofurantoin (MACRODANTIN) 50 MG capsule Take 1 capsule (50 mg total) by mouth at bedtime. 30 capsule 11   potassium citrate (UROCIT-K) 10 MEQ (1080 MG) SR tablet Take 20 mEq by mouth in the morning and at bedtime.     QUEtiapine (SEROQUEL XR) 400 MG 24 hr tablet Take 400 mg by mouth at bedtime.      medroxyPROGESTERone (DEPO-PROVERA) 150 MG/ML injection Inject 1 mL (150 mg total) into the muscle every 3 (three) months. (Patient not taking: Reported on 07/14/2022) 1 mL 3   ondansetron (ZOFRAN) 4 MG tablet Take 1 tablet (4 mg total) by mouth daily as needed for nausea or vomiting. (Patient not taking: Reported on 07/14/2022) 30 tablet 1   No current facility-administered medications on file prior to visit.    ALLERGIES: Allergies  Allergen Reactions   Morphine And Related Itching and Hives   Sulfa Antibiotics Swelling   Tape Itching and Other (See Comments)    Surgical     FAMILY HISTORY: Family History  Problem Relation Age of Onset   Cancer Paternal Grandmother    Heart failure Paternal Grandmother    Cancer Maternal Grandmother    Cancer  Father    Heart failure Father    Lung cancer Mother    Colon cancer Neg Hx     SOCIAL HISTORY: Social History   Socioeconomic History   Marital status: Divorced    Spouse name: Not on file   Number of children: Not on file   Years of education: Not on file   Highest education level: Not on file  Occupational History   Not on file  Tobacco Use   Smoking status: Former    Packs/day: 0.50    Years: 20.00    Total pack years: 10.00    Types: Cigarettes    Quit date: 02/08/2022    Years since quitting: 0.4   Smokeless tobacco: Never  Vaping Use   Vaping Use: Never used  Substance and Sexual Activity   Alcohol use: No   Drug use: No   Sexual activity: Not Currently    Birth control/protection: Injection  Other Topics Concern   Not on file  Social History Narrative   Divorced twice,1st lasted 5 years,2nd 4 years.On disabilty secondary to mental illness since 2004.Previously used to [employer].Lives alone,has 2 cats.cright handed    Are you right handed or left handed? right   Are you currently employed ? no   What is your current occupation?   Do you live at home alone?no   Who lives with you? dad   What type of home do you live in: 1 story or 2 story? one    Caffeine 2 cans of soda   Social Determinants of Health   Financial Resource Strain: Medium Risk (08/06/2020)   Overall Financial Resource Strain (CARDIA)    Difficulty of Paying Living Expenses: Somewhat hard  Food Insecurity: No Food Insecurity (03/27/2022)   Hunger Vital Sign    Worried About Running Out of Food in the Last Year: Never true    Ran Out of Food in the Last Year: Never true  Transportation Needs: No Transportation Needs (03/27/2022)   PRAPARE - Hydrologist (Medical): No    Lack of Transportation (Non-Medical): No  Physical Activity: Insufficiently Active (08/06/2020)   Exercise Vital Sign    Days of Exercise per Week: 2 days    Minutes of Exercise per  Session: 10 min  Stress: Stress Concern Present (08/06/2020)   [place]    Feeling of Stress : To some extent  Social Connections: Socially Isolated (08/06/2020)   Social Connection and Isolation Panel [NHANES]    Frequency of Communication with Friends and Family: More than three times a week    Frequency of Social Gatherings with Friends and Family: Three times a week    Attends Religious Services: Never    Active Member of Clubs or Organizations: No    Attends Archivist Meetings: Never    Marital Status: Divorced  Human resources officer Violence: Not At Risk (08/06/2020)   Humiliation, Afraid, Rape, and Kick questionnaire    Fear of Current or Ex-Partner: No    Emotionally Abused: No    Physically Abused: No    Sexually Abused: No     PHYSICAL EXAM: Vitals:   07/14/22 1309  BP: 112/76  Pulse: 88  SpO2: 95%   General: No acute distress Head:  Normocephalic/atraumatic Skin/Extremities: No rash, no edema Neurological Exam: alert and awake. No aphasia or dysarthria. Fund of knowledge is appropriate.  Attention and concentration are normal.   Cranial nerves: Pupils equal, round. Extraocular movements intact with no nystagmus. Visual fields full.  No facial asymmetry.  Motor: Bulk and tone normal, muscle strength 5/5 throughout with no pronator drift. Reflexes brisk +2 throughout, no Hoffman sign noted today, +hyperactive pectoralis reflex bilaterally.  Finger to nose testing intact.  Gait narrow-based and steady, no ataxia.   IMPRESSION: This is a 52 yo RH woman with a history of hypertension, hypothyroidism, nephrolithiasis, CKD, anxiety, depression, with frequent falls where she has no recollection of events. They show a video of her slumped down unresponsive. She continues to have frequent falls. MRI brain unremarkable, MRI cervical spine no myelopathy seen. Check B12, cooper, zince, vitamin E. Her routine and  26-hour EEG were normal, typical events were not captured. We discussed doing inpatient vEEG to further characterize symptoms. I wonder about iatrogenic cause if she is inadvertently taking medications twice, her sister wonders about this as well. Follow-up after tests, call for any changes.    Thank you for allowing me to participate in her care.  Please do not hesitate to call for any questions or concerns.    Ellouise Newer, M.D.   CC: Dr. Posey Pronto

## 2022-07-15 LAB — VITAMIN B12: Vitamin B-12: 445 pg/mL (ref 211–911)

## 2022-07-18 ENCOUNTER — Encounter: Payer: Self-pay | Admitting: Neurology

## 2022-07-18 ENCOUNTER — Other Ambulatory Visit: Payer: Self-pay

## 2022-07-18 DIAGNOSIS — R404 Transient alteration of awareness: Secondary | ICD-10-CM

## 2022-07-21 ENCOUNTER — Ambulatory Visit: Payer: Self-pay | Admitting: *Deleted

## 2022-07-21 NOTE — Patient Instructions (Addendum)
Visit Information  Thank you for taking time to visit with me today. Please don't hesitate to contact me if I can be of assistance to you.   Following are the goals we discussed today:   Goals Addressed               This Visit's Progress     Patient Stated     Falls [facility]) (pt-stated)   Not on track     Care Coordination Interventions: Advised patient of importance of notifying provider of falls Assessed for signs and symptoms of orthostatic hypotension Assessed for falls since last encounter Encouraged to drink adequate amounts of fluid to help with hypotension Confirmed taken offer clonidine Interventions Today    Flowsheet Row Most Recent Value  Chronic Disease   Chronic disease during today's visit Other  [falls, hypotension, dehydration]  General Interventions   General Interventions Discussed/Reviewed General Interventions Discussed, Doctor Visits  Doctor Visits Discussed/Reviewed Doctor Visits Discussed, PCP, Specialist  PCP/Specialist Visits Compliance with follow-up visit  Education Interventions   Education Provided Provided Education  Provided Verbal Education On Nutrition  [dehydration, hypotension affecting falls]  Mental Health Interventions   Mental Health Discussed/Reviewed Mental Health Discussed, Coping Strategies  Nutrition Interventions   Nutrition Discussed/Reviewed Fluid intake  Pharmacy Interventions   Pharmacy Dicussed/Reviewed Pharmacy Topics Discussed, Medications and their functions  Safety Interventions   Safety Discussed/Reviewed Safety Discussed, Home Safety  Home Safety Assistive Devices  Advanced Directive Interventions   Advanced Directives Discussed/Reviewed Advanced Directives Discussed           improve sleep (THN) (pt-stated)   On track     Care Coordination Interventions: Evaluation of current treatment plan related to poor sleep and patient's adherence to plan as established by provider Discussed plans with patient for ongoing  care management follow up and provided patient with direct contact information for care management team Confirmed improvement in sleep pattern        Our next appointment is by telephone on 08/15/22 at 3:30 pm  Please call the care guide team at [phone] if you need to cancel or reschedule your appointment.   If you are experiencing a Mental Health or [place] or need someone to talk to, please call the Suicide and Crisis Lifeline: 988 call the [place] National Suicide Prevention Lifeline: (812)541-5226 or TTY: 714-810-5788 TTY (204) 873-2468) to talk to a trained counselor call 1-800-273-TALK (toll free, 24 hour hotline) call the [facility]: (838) [phone] call 911   Patient verbalizes understanding of instructions and care plan provided today and agrees to view in [place]. Active MyChart status and patient understanding of how to access instructions and care plan via MyChart confirmed with patient.     Telephone follow up appointment with care management team member scheduled for: The patient has been provided with contact information for the care management team and has been advised to call with any health related questions or concerns.   Liboria Putnam L. Lavina Hamman, RN, BSN, [place] Coordinator Office number [phone]

## 2022-07-21 NOTE — Patient Outreach (Signed)
  Care Coordination   Follow Up Visit Note   07/21/2022 Name: [name] MRN: [id number] DOB: 1971/02/28  [name] is a 52 y.o. year old female who sees Lindell Spar, MD for primary care. I spoke with  [name] by phone today.  What matters to the patients health and wellness today?  Falls, Blood pressure decreases, taken off of clonidine. Kidney stones She reports another fall since the last RN CM outreach  Sleeping fine Dehydration continue to be an issue  Dr Delice Lesch seen and EEGs completed    Kidney stones Scheduled to have kidney surgery for the 4th time in a year  hydrocodone does not help  Wearing depends related to urinary leaks Has a history of urinary infection but without one presently    Goals Addressed               This Visit's Progress     Patient Stated     Falls [facility]) (pt-stated)   Not on track     Care Coordination Interventions: Advised patient of importance of notifying provider of falls Assessed for signs and symptoms of orthostatic hypotension Assessed for falls since last encounter Encouraged to drink adequate amounts of fluid to help with hypotension Confirmed taken offer clonidine Interventions Today    Flowsheet Row Most Recent Value  Chronic Disease   Chronic disease during today's visit Other  [falls, hypotension, dehydration]  General Interventions   General Interventions Discussed/Reviewed General Interventions Discussed, Doctor Visits  Doctor Visits Discussed/Reviewed Doctor Visits Discussed, PCP, Specialist  PCP/Specialist Visits Compliance with follow-up visit  Education Interventions   Education Provided Provided Education  Provided Verbal Education On Nutrition  [dehydration, hypotension affecting falls]  Mental Health Interventions   Mental Health Discussed/Reviewed Mental Health Discussed, Coping Strategies  Nutrition Interventions   Nutrition Discussed/Reviewed Fluid intake  Pharmacy Interventions   Pharmacy  Dicussed/Reviewed Pharmacy Topics Discussed, Medications and their functions  Safety Interventions   Safety Discussed/Reviewed Safety Discussed, Home Safety  Home Safety Assistive Devices  Advanced Directive Interventions   Advanced Directives Discussed/Reviewed Advanced Directives Discussed           improve sleep (THN) (pt-stated)   On track     Care Coordination Interventions: Evaluation of current treatment plan related to poor sleep and patient's adherence to plan as established by provider Discussed plans with patient for ongoing care management follow up and provided patient with direct contact information for care management team Confirmed improvement in sleep pattern        SDOH assessments and interventions completed:  Yes  SDOH Interventions Today    Flowsheet Row Most Recent Value  SDOH Interventions   Food Insecurity Interventions Intervention Not Indicated  Housing Interventions Intervention Not Indicated  Transportation Interventions Intervention Not Indicated  Utilities Interventions Intervention Not Indicated  Financial Strain Interventions Intervention Not Indicated  Stress Interventions Intervention Not Indicated  Social Connections Interventions Intervention Not Indicated        Care Coordination Interventions:  Yes, provided   Follow up plan: Follow up call scheduled for 08/15/22    Encounter Outcome:  Pt. Visit Completed  Carianne Taira L. Lavina Hamman, RN, BSN, [place] Coordinator Office number [phone]

## 2022-07-25 LAB — ZINC: Zinc: 65 ug/dL (ref 60–130)

## 2022-07-25 LAB — COPPER, SERUM: Copper: 151 ug/dL (ref 70–175)

## 2022-07-25 LAB — VITAMIN E
Gamma-Tocopherol (Vit E): 1.9 mg/L (ref <4.4)
Vitamin E (Alpha Tocopherol): 9.8 mg/L (ref 5.7–19.9)

## 2022-07-30 ENCOUNTER — Telehealth: Payer: Self-pay

## 2022-07-30 DIAGNOSIS — N183 Chronic kidney disease, stage 3 unspecified: Secondary | ICD-10-CM | POA: Diagnosis not present

## 2022-07-30 DIAGNOSIS — M549 Dorsalgia, unspecified: Secondary | ICD-10-CM | POA: Diagnosis not present

## 2022-07-30 DIAGNOSIS — I129 Hypertensive chronic kidney disease with stage 1 through stage 4 chronic kidney disease, or unspecified chronic kidney disease: Secondary | ICD-10-CM | POA: Diagnosis not present

## 2022-07-30 DIAGNOSIS — Z882 Allergy status to sulfonamides status: Secondary | ICD-10-CM | POA: Diagnosis not present

## 2022-07-30 DIAGNOSIS — N390 Urinary tract infection, site not specified: Secondary | ICD-10-CM | POA: Diagnosis not present

## 2022-07-30 DIAGNOSIS — R109 Unspecified abdominal pain: Secondary | ICD-10-CM | POA: Diagnosis not present

## 2022-07-30 DIAGNOSIS — R1033 Periumbilical pain: Secondary | ICD-10-CM | POA: Diagnosis not present

## 2022-07-30 DIAGNOSIS — N136 Pyonephrosis: Secondary | ICD-10-CM | POA: Diagnosis not present

## 2022-07-30 DIAGNOSIS — Z885 Allergy status to narcotic agent status: Secondary | ICD-10-CM | POA: Diagnosis not present

## 2022-07-30 DIAGNOSIS — N1330 Unspecified hydronephrosis: Secondary | ICD-10-CM | POA: Diagnosis not present

## 2022-07-30 DIAGNOSIS — F17210 Nicotine dependence, cigarettes, uncomplicated: Secondary | ICD-10-CM | POA: Diagnosis not present

## 2022-07-30 DIAGNOSIS — Z79899 Other long term (current) drug therapy: Secondary | ICD-10-CM | POA: Diagnosis not present

## 2022-07-30 NOTE — Telephone Encounter (Signed)
Patient called in asking for an update on scheduling an EEG, states she hasn't heard from anyone about scheduling. Please advise.

## 2022-07-30 NOTE — Telephone Encounter (Signed)
Pt called informed that I had reached out to the hospital if she does not hear form them let me know,

## 2022-07-31 NOTE — Pre-Procedure Instructions (Signed)
Dr Charna Elizabeth aware of 3.3 potassium. No orders given.

## 2022-08-01 ENCOUNTER — Ambulatory Visit (INDEPENDENT_AMBULATORY_CARE_PROVIDER_SITE_OTHER): Payer: Medicare HMO | Admitting: Internal Medicine

## 2022-08-01 ENCOUNTER — Encounter (HOSPITAL_COMMUNITY)
Admission: RE | Admit: 2022-08-01 | Discharge: 2022-08-01 | Disposition: A | Discharge status: Home / Self Care | Payer: Medicare HMO | Source: Ambulatory Visit | Attending: Urology | Admitting: Urology

## 2022-08-01 ENCOUNTER — Other Ambulatory Visit: Payer: Self-pay

## 2022-08-01 ENCOUNTER — Encounter: Payer: Self-pay | Admitting: Internal Medicine

## 2022-08-01 ENCOUNTER — Encounter (HOSPITAL_COMMUNITY): Payer: Self-pay

## 2022-08-01 VITALS — Ht 62.0 in | Wt 197.0 lb

## 2022-08-01 VITALS — BP 112/75 | HR 88 | Ht 62.0 in | Wt 200.6 lb

## 2022-08-01 DIAGNOSIS — R296 Repeated falls: Secondary | ICD-10-CM | POA: Diagnosis not present

## 2022-08-01 DIAGNOSIS — N3000 Acute cystitis without hematuria: Secondary | ICD-10-CM | POA: Insufficient documentation

## 2022-08-01 DIAGNOSIS — N200 Calculus of kidney: Secondary | ICD-10-CM | POA: Diagnosis not present

## 2022-08-01 DIAGNOSIS — Z01818 Encounter for other preprocedural examination: Secondary | ICD-10-CM

## 2022-08-01 DIAGNOSIS — Z01812 Encounter for preprocedural laboratory examination: Secondary | ICD-10-CM | POA: Insufficient documentation

## 2022-08-01 DIAGNOSIS — R739 Hyperglycemia, unspecified: Secondary | ICD-10-CM | POA: Diagnosis not present

## 2022-08-01 DIAGNOSIS — E782 Mixed hyperlipidemia: Secondary | ICD-10-CM | POA: Diagnosis not present

## 2022-08-01 DIAGNOSIS — F3170 Bipolar disorder, currently in remission, most recent episode unspecified: Secondary | ICD-10-CM

## 2022-08-01 DIAGNOSIS — N1832 Chronic kidney disease, stage 3b: Secondary | ICD-10-CM

## 2022-08-01 DIAGNOSIS — E038 Other specified hypothyroidism: Secondary | ICD-10-CM

## 2022-08-01 DIAGNOSIS — I10 Essential (primary) hypertension: Secondary | ICD-10-CM

## 2022-08-01 DIAGNOSIS — R404 Transient alteration of awareness: Secondary | ICD-10-CM | POA: Diagnosis not present

## 2022-08-01 HISTORY — DX: Acute cystitis without hematuria: N30.00

## 2022-08-01 NOTE — Assessment & Plan Note (Addendum)
On Lipitor Lipid profile reviewed

## 2022-08-01 NOTE — Assessment & Plan Note (Signed)
Followed by urology Has had surgical extraction of nephrolithiasis in the past Currently undergoing treatment for UTI, has had recurrent UTI as well On Uroxatral Planned to get Nephrolithotomy

## 2022-08-01 NOTE — Progress Notes (Signed)
Established Patient Office Visit  Subjective:  Patient ID: [name], female    DOB: [birth year]  Age: 52 y.o. MRN: [id number]  CC:  Chief Complaint  Patient presents with   Hypertension    Four month follow up. Patient states she fell 07/03/2022    HPI [name] is a 52 y.o. female with past medical history of HTN, CKD stage III, bipolar disorder and nephrolithiasis who presents for f/u of her chronic medical conditions.  Her sister is present during the visit today.  HTN: BP was well controlled today.  She has stopped taking clonidine 0.1 mg daily as advised.  She has seen improvement in dizziness since decreasing dose of clonidine.  Denies any headache, chest pain or palpitations.  Recurrent falls: She still reports recurrent falls, but frequency has decreased now.  She has seen neurologist, and has had MRI of the brain and cervical spine, which were unremarkable to suggest any specific etiology for falls.  She reports that she has been having LOC for few seconds before the fall and remains confused after the fall as well.  Denies any episode of shaking.  She also had EEG, which was also unremarkable.  She reports chronic leg weakness.  CKD:  Followed by Nephrology.  Denies any dysuria, hematuria or urinary hesitancy or resistance.  Recurrent nephrolithiasis: She is planned to get nephrolithotomy in the next week.  She recently had episode of flank pain, and went to ER.  She was found to have acute UTI and was given Keflex for it.  She currently denies any dysuria, hematuria, flank pain, fever, chills or nausea/vomiting.  Bipolar disorder: She is currently on Abilify, Seroquel, Lamictal, doxepin and Xanax.  She is also on Ingrezza.  There is some concern of her overdosing on her medications, which is a very high risk factor for falls.   Past Medical History:  Diagnosis Date   Anxiety    Chronic kidney disease    CKD (chronic kidney disease) stage 3, GFR 30-59 ml/min (HCC)  03/15/2019   Depression    Diarrhea 05/04/2017   Essential hypertension, benign 03/15/2019   History of kidney stones    Hypertension    Hypothyroidism    Hypothyroidism, adult 03/15/2019   Kidney stones    LGSIL on Pap smear of cervix 08/17/2020   Needs colpo per [facility] immediate risk is 4.3 for CIN 3+risk    Obesity (BMI 30-39.9) 03/15/2019   Renal disorder    Vitamin D deficiency disease 03/15/2019    Past Surgical History:  Procedure Laterality Date   BIOPSY  06/05/2017   Procedure: BIOPSY;  Surgeon: Rogene Houston, MD;  [facility]: [facility];  Service: Endoscopy;;  colon   CHOLECYSTECTOMY     COLONOSCOPY N/A 06/05/2017   Procedure: COLONOSCOPY;  Surgeon: Rogene Houston, MD;  Location: [facility];  Service: Endoscopy;  Laterality: N/A;  8:30   CYSTOSCOPY WITH RETROGRADE PYELOGRAM, URETEROSCOPY AND STENT PLACEMENT Left 10/04/2020   Procedure: CYSTOSCOPY WITH LEFT RETROGRADE PYELOGRAM, DIAGNOSTIC LEFT URETEROSCOPY AND LEFT URETEREAL STENT PLACEMENT;  Surgeon: Cleon Gustin, MD;  Location: [facility];  Service: Urology;  Laterality: Left;   CYSTOSCOPY WITH RETROGRADE PYELOGRAM, URETEROSCOPY AND STENT PLACEMENT Left 10/16/2020   Procedure: CYSTOSCOPY WITH LEFT RETROGRADE PYELOGRAM, LEFT URETEROSCOPY  WITH LASER AND LEFT URETERAL STENT EXCHANGE;  Surgeon: Cleon Gustin, MD;  Location: [facility];  Service: Urology;  Laterality: Left;   CYSTOSCOPY WITH RETROGRADE PYELOGRAM, URETEROSCOPY AND STENT PLACEMENT Bilateral  02/17/2022   Procedure: CYSTOSCOPY WITH RETROGRADE PYELOGRAM, URETEROSCOPY AND STENT PLACEMENT;  Surgeon: Cleon Gustin, MD;  Location: [facility];  Service: Urology;  Laterality: Bilateral;   CYSTOSCOPY WITH RETROGRADE PYELOGRAM, URETEROSCOPY AND STENT PLACEMENT Left 06/26/2022   Procedure: CYSTOSCOPY WITH RETROGRADE PYELOGRAM, URETEROSCOPY AND STENT PLACEMENT;  Surgeon: Cleon Gustin, MD;  Location: [facility];  Service: Urology;  Laterality: Left;   HOLMIUM LASER  APPLICATION Left [id number]   Procedure: HOLMIUM LASER APPLICATION;  Surgeon: Cleon Gustin, MD;  Location: [facility];  Service: Urology;  Laterality: Left;   HOLMIUM LASER APPLICATION Left [id number]   Procedure: HOLMIUM LASER APPLICATION;  Surgeon: Cleon Gustin, MD;  Location: [facility];  Service: Urology;  Laterality: Left;   KIDNEY STONE SURGERY     kidney stones     LASER ABLATION CONDOLAMATA N/A 10/31/2020   Procedure: LASER ABLATION OF THE CERVIX;  Surgeon: Florian Buff, MD;  Location: [facility];  Service: Gynecology;  Laterality: N/A;    Family History  Problem Relation Age of Onset   Cancer Paternal Grandmother    Heart failure Paternal Grandmother    Cancer Maternal Grandmother    Cancer Father    Heart failure Father    Lung cancer Mother    Colon cancer Neg Hx     Social History   Socioeconomic History   Marital status: Divorced    Spouse name: Not on file   Number of children: Not on file   Years of education: Not on file   Highest education level: Not on file  Occupational History   Not on file  Tobacco Use   Smoking status: Former    Packs/day: 0.50    Years: 20.00    Total pack years: 10.00    Types: Cigarettes    Quit date: 02/08/2022    Years since quitting: 0.4   Smokeless tobacco: Never  Vaping Use   Vaping Use: Never used  Substance and Sexual Activity   Alcohol use: No   Drug use: No   Sexual activity: Not Currently    Birth control/protection: Injection  Other Topics Concern   Not on file  Social History Narrative   Divorced twice,1st lasted 5 years,2nd 4 years.On disabilty secondary to mental illness since 2004.Previously used to [employer]Lives alone,has 2 cats.cright handed    Are you right handed or left handed? right   Are you currently employed ? no   What is your current occupation?   Do you live at home alone?no   Who lives with you? dad   What type of home do you live in: 1 story or 2 story? one    Caffeine 2 cans  of soda   Social Determinants of Health   Financial Resource Strain: Low Risk  (07/21/2022)   Overall Financial Resource Strain (CARDIA)    Difficulty of Paying Living Expenses: Not very hard  Food Insecurity: No Food Insecurity (07/21/2022)   Hunger Vital Sign    Worried About Running Out of Food in the Last Year: Never true    Ran Out of Food in the Last Year: Never true  Transportation Needs: No Transportation Needs (07/21/2022)   PRAPARE - Hydrologist (Medical): No    Lack of Transportation (Non-Medical): No  Physical Activity: Insufficiently Active (08/06/2020)   Exercise Vital Sign    Days of Exercise per Week: 2 days    Minutes of Exercise per Session: 10 min  Stress:  No Stress Concern Present (07/21/2022)   [place]    Feeling of Stress : Only a little  Social Connections: Socially Isolated (07/21/2022)   Social Connection and Isolation Panel [NHANES]    Frequency of Communication with Friends and Family: More than three times a week    Frequency of Social Gatherings with Friends and Family: Three times a week    Attends Religious Services: Never    Active Member of Clubs or Organizations: No    Attends Archivist Meetings: Never    Marital Status: Divorced  Human resources officer Violence: Not At Risk (07/21/2022)   Humiliation, Afraid, Rape, and Kick questionnaire    Fear of Current or Ex-Partner: No    Emotionally Abused: No    Physically Abused: No    Sexually Abused: No    Outpatient Medications Prior to Visit  Medication Sig Dispense Refill   alfuzosin (UROXATRAL) 10 MG 24 hr tablet Take 1 tablet (10 mg total) by mouth daily with breakfast. 30 tablet 0   ALPRAZolam (XANAX) 1 MG tablet Take 1 mg by mouth 3 (three) times daily as needed for anxiety.      amLODipine (NORVASC) 5 MG tablet Take 1 tablet (5 mg total) by mouth in the morning and at bedtime. 60 tablet 2    ARIPiprazole (ABILIFY) 5 MG tablet Take 5 mg by mouth at bedtime.     atorvastatin (LIPITOR) 20 MG tablet Take 1 tablet (20 mg total) by mouth daily. 90 tablet 3   carvedilol (COREG) 25 MG tablet Take 25 mg by mouth 2 (two) times daily.     chlorthalidone (HYGROTON) 25 MG tablet Take 12.5 mg by mouth daily.     Cholecalciferol (VITAMIN D3) 125 MCG (5000 UT) TABS Take 10,000 Units by mouth daily.     doxepin (SINEQUAN) 75 MG capsule Take 225 mg by mouth at bedtime.      INGREZZA 60 MG capsule Take 60 mg by mouth at bedtime.     lamoTRIgine (LAMICTAL) 200 MG tablet Take 200 mg by mouth 2 (two) times daily.     losartan (COZAAR) 25 MG tablet Take 25 mg by mouth in the morning.     medroxyPROGESTERone (DEPO-PROVERA) 150 MG/ML injection Inject 1 mL (150 mg total) into the muscle every 3 (three) months. (Patient not taking: Reported on 07/14/2022) 1 mL 3   nitrofurantoin (MACRODANTIN) 50 MG capsule Take 1 capsule (50 mg total) by mouth at bedtime. 30 capsule 11   ondansetron (ZOFRAN) 4 MG tablet Take 1 tablet (4 mg total) by mouth daily as needed for nausea or vomiting. (Patient not taking: Reported on 07/14/2022) 30 tablet 1   oxyCODONE-acetaminophen (PERCOCET) 5-325 MG tablet Take 1 tablet by mouth every 4 (four) hours as needed. 30 tablet 0   potassium citrate (UROCIT-K) 10 MEQ (1080 MG) SR tablet Take 20 mEq by mouth in the morning and at bedtime.     QUEtiapine (SEROQUEL XR) 400 MG 24 hr tablet Take 400 mg by mouth at bedtime.      No facility-administered medications prior to visit.    Allergies  Allergen Reactions   Morphine And Related Itching and Hives   Sulfa Antibiotics Swelling   Tape Itching and Other (See Comments)    Surgical     ROS Review of Systems  Constitutional:  Negative for chills and fever.  HENT:  Negative for congestion, sinus pressure, sinus pain and sore throat.   Eyes:  Negative for pain and discharge.  Respiratory:  Negative for cough and shortness of breath.    Cardiovascular:  Negative for chest pain and palpitations.  Gastrointestinal:  Negative for abdominal pain, diarrhea, nausea and vomiting.  Endocrine: Negative for polydipsia and polyuria.  Genitourinary:  Negative for dysuria and hematuria.  Musculoskeletal:  Negative for neck pain and neck stiffness.  Skin:  Negative for rash.  Neurological:  Negative for dizziness and weakness.  Psychiatric/Behavioral:  Negative for agitation and behavioral problems. The patient is nervous/anxious.       Objective:    Physical Exam Vitals reviewed.  Constitutional:      General: She is not in acute distress.    Appearance: She is not diaphoretic.  HENT:     Head: Normocephalic and atraumatic.     Nose: Nose normal.     Mouth/Throat:     Mouth: Mucous membranes are moist.  Eyes:     General: No scleral icterus.    Extraocular Movements: Extraocular movements intact.  Cardiovascular:     Rate and Rhythm: Normal rate and regular rhythm.     Pulses: Normal pulses.     Heart sounds: Normal heart sounds. No murmur heard. Pulmonary:     Breath sounds: Normal breath sounds. No wheezing or rales.  Musculoskeletal:     Cervical back: Neck supple. No tenderness.     Right lower leg: No edema.     Left lower leg: No edema.  Skin:    General: Skin is warm.     Findings: No rash.  Neurological:     General: No focal deficit present.     Mental Status: She is alert and oriented to person, place, and time.     Sensory: No sensory deficit.     Motor: Weakness (B/l LE - 4/5) present.  Psychiatric:        Mood and Affect: Mood normal.        Behavior: Behavior normal.     BP 112/75 (BP Location: Right Arm, Patient Position: Sitting, Cuff Size: Large)   Pulse 88   Ht '5\' 2"'$  (1.575 m)   Wt 200 lb 9.6 oz (91 kg)   SpO2 93%   BMI 36.69 kg/m  Wt Readings from Last 3 Encounters:  08/01/22 197 lb (89.4 kg)  08/01/22 200 lb 9.6 oz (91 kg)  07/14/22 197 lb (89.4 kg)    Lab Results  Component  Value Date   TSH 4.61 (H) 07/04/2020   Lab Results  Component Value Date   WBC 7.2 10/30/2020   HGB 12.6 10/30/2020   HCT 39.8 10/30/2020   MCV 89.4 10/30/2020   PLT 410 (H) 10/30/2020   Lab Results  Component Value Date   NA 138 06/25/2022   K 3.6 06/25/2022   CO2 20 (L) 06/25/2022   GLUCOSE 78 06/25/2022   BUN 18 06/25/2022   CREATININE 1.25 (H) 06/25/2022   BILITOT 0.3 10/30/2020   ALKPHOS 169 (H) 10/30/2020   AST 18 10/30/2020   ALT 28 10/30/2020   PROT 7.5 10/30/2020   ALBUMIN 3.9 08/31/2021   CALCIUM 9.1 06/25/2022   ANIONGAP 14 06/25/2022   Lab Results  Component Value Date   CHOL 244 (H) 09/04/2021   Lab Results  Component Value Date   HDL 44 (L) 09/04/2021   Lab Results  Component Value Date   LDLCALC 161 (H) 09/04/2021   Lab Results  Component Value Date   TRIG 218 (H) 09/04/2021   Lab Results  Component Value Date   CHOLHDL 5.5 (H) 09/04/2021   Lab Results  Component Value Date   HGBA1C 5.2 09/04/2021      Assessment & Plan:   Problem List Items Addressed This Visit       Cardiovascular and Mediastinum   Essential hypertension, benign    BP Readings from Last 1 Encounters:  08/01/22 112/75  Well-controlled with amlodipine, Losartan, Coreg and chlorthalidone Followed by Nephrology Episodes of dizziness could be due to dehydration and orthostatic hypotension - improved since discontinuing clonidine in the last visit Needs to improve hydration Counseled for compliance with the medications Advised DASH diet and moderate exercise/walking, at least 150 mins/week        Endocrine   Subclinical hypothyroidism    Chart review suggests mildly elevated TSH only once in the past She was on NP thyroid in the past, check TSH and free T4      Relevant Orders   TSH + free T4     Genitourinary   CKD (chronic kidney disease) stage 3, GFR 30-59 ml/min (HCC)    Likely due to uncontrolled HTN in the past Followed by Nephrology - last visit  note reviewed On Losartan and Chlorthalidone Avoid nephrotoxic agents      Recurrent nephrolithiasis    Followed by urology Has had surgical extraction of nephrolithiasis in the past Currently undergoing treatment for UTI, has had recurrent UTI as well On Uroxatral Planned to get Nephrolithotomy      Acute cystitis without hematuria    Recent ER visit note reviewed, on Keflex        Other   Bipolar disorder in partial remission ([place])    Followed by psychiatry On Abilify, doxepin, Lamictal and Seroquel Takes Xanax as needed for anxiety She is on multiple sedative medications, which can also cause falls - advised to discuss with Psychiatrist to decrease dose of Seroquel or Doxepin if appropriate      Mixed hyperlipidemia    On Lipitor Lipid profile reviewed      Relevant Orders   Lipid Profile   Recurrent falls - Primary    Her episodes of fall or near fall were thought to be from vasovagal syncope in the setting of dehydration and/or orthostatic hypotension due to diuretics and clonidine Tapered dose of clonidine and discontinued Advised to maintain at least 64 ounces of fluid intake in a day Fall frequency has decreased, last fall 02/01 Since she reports episodes of LOC before falls, would prefer neurology evaluation for possible seizures -last neurology visit note reviewed As she is on multiple psychiatric medicines that can cause/provoke dizziness/sedation - Abilify, Seroquel, Lamictal, doxepin and Xanax      Other Visit Diagnoses     Hyperglycemia       Relevant Orders   Hemoglobin A1c       No orders of the defined types were placed in this encounter.   Follow-up: Return in about 4 months (around 12/01/2022) for Annual physical.    Lindell Spar, MD

## 2022-08-01 NOTE — Assessment & Plan Note (Addendum)
BP Readings from Last 1 Encounters:  08/01/22 112/75   Well-controlled with amlodipine, Losartan, Coreg and chlorthalidone Followed by Nephrology Episodes of dizziness could be due to dehydration and orthostatic hypotension - improved since discontinuing clonidine in the last visit Needs to improve hydration Counseled for compliance with the medications Advised DASH diet and moderate exercise/walking, at least 150 mins/week

## 2022-08-01 NOTE — Assessment & Plan Note (Signed)
Likely due to uncontrolled HTN in the past Followed by Nephrology - last visit note reviewed On Losartan and Chlorthalidone Avoid nephrotoxic agents

## 2022-08-01 NOTE — Assessment & Plan Note (Signed)
Followed by psychiatry On Abilify, doxepin, Lamictal and Seroquel Takes Xanax as needed for anxiety She is on multiple sedative medications, which can also cause falls - advised to discuss with Psychiatrist to decrease dose of Seroquel or Doxepin if appropriate

## 2022-08-01 NOTE — Assessment & Plan Note (Signed)
Chart review suggests mildly elevated TSH only once in the past She was on NP thyroid in the past, check TSH and free T4

## 2022-08-01 NOTE — Assessment & Plan Note (Signed)
Recent ER visit note reviewed, on Keflex

## 2022-08-01 NOTE — Assessment & Plan Note (Addendum)
Her episodes of fall or near fall were thought to be from vasovagal syncope in the setting of dehydration and/or orthostatic hypotension due to diuretics and clonidine Tapered dose of clonidine and discontinued Advised to maintain at least 64 ounces of fluid intake in a day Fall frequency has decreased, last fall 02/01 Since she reports episodes of LOC before falls, would prefer neurology evaluation for possible seizures -last neurology visit note reviewed As she is on multiple psychiatric medicines that can cause/provoke dizziness/sedation - Abilify, Seroquel, Lamictal, doxepin and Xanax

## 2022-08-01 NOTE — Patient Instructions (Addendum)
Please continue taking medications as prescribed.  Please continue to follow low salt diet and ambulate as tolerated.  Please get fasting blood tests done before the next visit.

## 2022-08-04 ENCOUNTER — Other Ambulatory Visit: Payer: Self-pay | Admitting: Internal Medicine

## 2022-08-04 DIAGNOSIS — N200 Calculus of kidney: Secondary | ICD-10-CM

## 2022-08-04 NOTE — Consult Note (Signed)
Chief Complaint: Patient was seen in consultation today for left percutaneous nephrostomy/nephroureteral catheter placement  Referring Physician(s): Maitland L  Supervising Physician: Sandi Mariscal  Patient Status: [facility] - Out-pt  History of Present Illness: [name] is a 52 y.o. female with past medical history of anxiety/depression, chronic kidney disease, bipolar disorder, hypertension, hypothyroidism, vitamin D deficiency and nephrolithiasis.  She is status post left retrograde pyelography with left ureteroscopic stone manipulation with laser lithotripsy and left double-J stent placement on 06/26/2022.  Latest CT abdomen pelvis performed on 123456 for renal colic revealed:  1. Left nephroureteral stent in place with mild left hydronephrosis despite appropriate stent placement. No stone or stone fragments along the course of the stent. 2. Nonobstructing bilateral nephrolithiasis. 3. Left renal atrophy and parenchymal scarring. 4. Small right pleural effusion, new from prior CT. Possible pleural thickening in the anterior right lower hemithorax. Scattered basilar atelectasis. Pulmonary opacities are only partially included in the field of view. 5. Colonic diverticulosis without diverticulitis.   She has a 12 mm stone in the lower pole of the left kidney. Punctate stone in the upper pole of the left kidney.  She presents today for left percutaneous nephrostomy/nephroureteral catheter placement prior to nephrolithotomy on 3/7 at [facility].  Past Medical History:  Diagnosis Date   Anxiety    Chronic kidney disease    CKD (chronic kidney disease) stage 3, GFR 30-59 ml/min ([place]) 03/15/2019   Depression    Diarrhea 05/04/2017   Essential hypertension, benign 03/15/2019   History of kidney stones    Hypertension    Hypothyroidism    Hypothyroidism, adult 03/15/2019   Kidney stones    LGSIL on Pap smear of cervix 08/17/2020   Needs colpo per [facility]CCP  immediate risk is 4.3 for CIN 3+risk    Obesity (BMI 30-39.9) 03/15/2019   Renal disorder    Vitamin D deficiency disease 03/15/2019    Past Surgical History:  Procedure Laterality Date   BIOPSY  06/05/2017   Procedure: BIOPSY;  Surgeon: Rogene Houston, MD;  Location: [facility];  Service: Endoscopy;;  colon   CHOLECYSTECTOMY     COLONOSCOPY N/A 06/05/2017   Procedure: COLONOSCOPY;  Surgeon: Rogene Houston, MD;  Location: [facility];  Service: Endoscopy;  Laterality: N/A;  8:30   CYSTOSCOPY WITH RETROGRADE PYELOGRAM, URETEROSCOPY AND STENT PLACEMENT Left 10/04/2020   Procedure: CYSTOSCOPY WITH LEFT RETROGRADE PYELOGRAM, DIAGNOSTIC LEFT URETEROSCOPY AND LEFT URETEREAL STENT PLACEMENT;  Surgeon: Cleon Gustin, MD;  Location: [facility] ORS;  Service: Urology;  Laterality: Left;   CYSTOSCOPY WITH RETROGRADE PYELOGRAM, URETEROSCOPY AND STENT PLACEMENT Left 10/16/2020   Procedure: CYSTOSCOPY WITH LEFT RETROGRADE PYELOGRAM, LEFT URETEROSCOPY  WITH LASER AND LEFT URETERAL STENT EXCHANGE;  Surgeon: Cleon Gustin, MD;  Location: [facility] ORS;  Service: Urology;  Laterality: Left;   CYSTOSCOPY WITH RETROGRADE PYELOGRAM, URETEROSCOPY AND STENT PLACEMENT Bilateral 02/17/2022   Procedure: CYSTOSCOPY WITH RETROGRADE PYELOGRAM, URETEROSCOPY AND STENT PLACEMENT;  Surgeon: Cleon Gustin, MD;  Location: [facility] OR[facility];  Service: Urology;  Laterality: Bilateral;   CYSTOSCOPY WITH RETROGRADE PYELOGRAM, URETEROSCOPY AND STENT PLACEMENT Left 06/26/2022   Procedure: CYSTOSCOPY WITH RETROGRADE PYELOGRAM, URETEROSCOPY AND STENT PLACEMENT;  Surgeon: Cleon Gustin, MD;  Location: [facility] OR[facility];  Service: Urology;  Laterality: Left;   HOLMIUM LASER APPLICATION Left [id number]   Procedure: HOLMIUM LASER APPLICATION;  Surgeon: Cleon Gustin, MD;  Location: [facility] OR[facility];  Service: Urology;  Laterality: Left;   HOLMIUM LASER APPLICATION Left [id number]  Procedure: HOLMIUM LASER APPLICATION;  Surgeon: Cleon Gustin, MD;   Location: [facility] OR[facility];  Service: Urology;  Laterality: Left;   KIDNEY STONE SURGERY     kidney stones     LASER ABLATION CONDOLAMATA N/A 10/31/2020   Procedure: LASER ABLATION OF THE CERVIX;  Surgeon: Florian Buff, MD;  Location: [facility] OR[facility];  Service: Gynecology;  Laterality: N/A;    Allergies: Morphine and related, Sulfa antibiotics, and Tape  Medications: Prior to Admission medications   Medication Sig Start Date End Date Taking? Authorizing Provider  alfuzosin (UROXATRAL) 10 MG 24 hr tablet Take 1 tablet (10 mg total) by mouth daily with breakfast. 06/26/22   McKenzie, Candee Furbish, MD  ALPRAZolam Duanne Moron) 1 MG tablet Take 1 mg by mouth 3 (three) times daily as needed for anxiety.     [provider]  amLODipine (NORVASC) 5 MG tablet Take 1 tablet (5 mg total) by mouth in the morning and at bedtime. 12/04/20   Ailene Ards, NP  ARIPiprazole (ABILIFY) 5 MG tablet Take 5 mg by mouth at bedtime.    [provider]  atorvastatin (LIPITOR) 20 MG tablet Take 1 tablet (20 mg total) by mouth daily. 04/04/22   Lindell Spar, MD  carvedilol (COREG) 25 MG tablet Take 25 mg by mouth 2 (two) times daily. 11/08/20   [provider]  cephALEXin (KEFLEX) 500 MG capsule Take 500 mg by mouth 3 (three) times daily. 07/31/22   [provider]  chlorthalidone (HYGROTON) 25 MG tablet Take 12.5 mg by mouth daily. 08/13/21   [provider]  Cholecalciferol (VITAMIN D3) 125 MCG (5000 UT) TABS Take 10,000 Units by mouth daily.    [provider]  doxepin (SINEQUAN) 75 MG capsule Take 225 mg by mouth at bedtime.     [provider]  INGREZZA 60 MG capsule Take 60 mg by mouth at bedtime. 04/17/22   [provider]  lamoTRIgine (LAMICTAL) 200 MG tablet Take 200 mg by mouth 2 (two) times daily.    [provider]  losartan (COZAAR) 25 MG tablet Take 25 mg by mouth in the morning.    [provider]  nitrofurantoin (MACRODANTIN) 50 MG capsule  Take 1 capsule (50 mg total) by mouth at bedtime. 09/23/21   McKenzie, Candee Furbish, MD  ondansetron (ZOFRAN) 4 MG tablet Take 1 tablet (4 mg total) by mouth daily as needed for nausea or vomiting. Patient not taking: Reported on 07/14/2022 06/26/22 06/26/23  Cleon Gustin, MD  ondansetron (ZOFRAN-ODT) 4 MG disintegrating tablet Take 4 mg by mouth every 8 (eight) hours as needed for nausea or vomiting. 07/31/22 08/07/22  [provider]  oxyCODONE-acetaminophen (PERCOCET) 5-325 MG tablet Take 1 tablet by mouth every 4 (four) hours as needed. 07/14/22   McKenzie, Candee Furbish, MD  potassium citrate (UROCIT-K) 10 MEQ (1080 MG) SR tablet Take 20 mEq by mouth in the morning and at bedtime. 08/16/20   [provider]  QUEtiapine (SEROQUEL XR) 400 MG 24 hr tablet Take 400 mg by mouth at bedtime.     [provider]  traMADol (ULTRAM) 50 MG tablet Take 50 mg by mouth every 6 (six) hours as needed (pain). 07/31/22 08/05/22  [provider]     Family History  Problem Relation Age of Onset   Cancer Paternal Grandmother    Heart failure Paternal Grandmother    Cancer Maternal Grandmother    Cancer Father    Heart failure Father  Lung cancer Mother    Colon cancer Neg Hx     Social History   Socioeconomic History   Marital status: Divorced    Spouse name: Not on file   Number of children: Not on file   Years of education: Not on file   Highest education level: Not on file  Occupational History   Not on file  Tobacco Use   Smoking status: Former    Packs/day: 0.50    Years: 20.00    Total pack years: 10.00    Types: Cigarettes    Quit date: 02/08/2022    Years since quitting: 0.4   Smokeless tobacco: Never  Vaping Use   Vaping Use: Never used  Substance and Sexual Activity   Alcohol use: No   Drug use: No   Sexual activity: Not Currently    Birth control/protection: Injection  Other Topics Concern   Not on file  Social History Narrative   Divorced  twice,1st lasted 5 years,2nd 4 years.On disabilty secondary to mental illness since 2004.Previously used to [employer].Lives alone,has 2 cats.cright handed    Are you right handed or left handed? right   Are you currently employed ? no   What is your current occupation?   Do you live at home alone?no   Who lives with you? dad   What type of home do you live in: 1 story or 2 story? one    Caffeine 2 cans of soda   Social Determinants of Health   Financial Resource Strain: Low Risk  (07/21/2022)   Overall Financial Resource Strain (CARDIA)    Difficulty of Paying Living Expenses: Not very hard  Food Insecurity: No Food Insecurity (07/21/2022)   Hunger Vital Sign    Worried About Running Out of Food in the Last Year: Never true    Ran Out of Food in the Last Year: Never true  Transportation Needs: No Transportation Needs (07/21/2022)   PRAPARE - Hydrologist (Medical): No    Lack of Transportation (Non-Medical): No  Physical Activity: Insufficiently Active (08/06/2020)   Exercise Vital Sign    Days of Exercise per Week: 2 days    Minutes of Exercise per Session: 10 min  Stress: No Stress Concern Present (07/21/2022)   [place]    Feeling of Stress : Only a little  Social Connections: Socially Isolated (07/21/2022)   Social Connection and Isolation Panel [NHANES]    Frequency of Communication with Friends and Family: More than three times a week    Frequency of Social Gatherings with Friends and Family: Three times a week    Attends Religious Services: Never    Active Member of Clubs or Organizations: No    Attends Archivist Meetings: Never    Marital Status: Divorced      Review of Systems  Vital Signs:   Code Status:         Physical Exam  Imaging: No results found.  Labs:  CBC: No results for input(s): "WBC", "HGB", "HCT", "PLT" in the last 8760  hours.  COAGS: No results for input(s): "INR", "APTT" in the last 8760 hours.  BMP: Recent Labs    08/31/21 1256 06/25/22 1305  NA 138 138  K 3.4* 3.6  CL 103 104  CO2 24 20*  GLUCOSE 99 78  BUN 23* 18  CALCIUM 9.1 9.1  CREATININE 1.52* 1.25*  GFRNONAA 42* 53*  LIVER FUNCTION TESTS: Recent Labs    08/31/21 1256  ALBUMIN 3.9    TUMOR MARKERS: No results for input(s): "AFPTM", "CEA", "CA199", "CHROMGRNA" in the last 8760 hours.  Assessment and Plan: 52 y.o. female with past medical history of anxiety/depression, chronic kidney disease, bipolar disorder, hypertension, hypothyroidism, vitamin D deficiency and nephrolithiasis.  She is status post left retrograde pyelography with left ureteroscopic stone manipulation with laser lithotripsy and left double-J stent placement on 06/26/2022.  Latest CT abdomen pelvis performed on 123456 for renal colic revealed:  1. Left nephroureteral stent in place with mild left hydronephrosis despite appropriate stent placement. No stone or stone fragments along the course of the stent. 2. Nonobstructing bilateral nephrolithiasis. 3. Left renal atrophy and parenchymal scarring. 4. Small right pleural effusion, new from prior CT. Possible pleural thickening in the anterior right lower hemithorax. Scattered basilar atelectasis. Pulmonary opacities are only partially included in the field of view. 5. Colonic diverticulosis without diverticulitis.   She has a 12 mm stone in the lower pole of the left kidney. Punctate stone in the upper pole of the left kidney.  She presents today for left percutaneous nephrostomy/nephroureteral catheter placement prior to nephrolithotomy on 3/7 at [facility] and benefits of left PCN placement was discussed with the patient including, but not limited to, infection, bleeding, significant bleeding causing loss or decrease in renal function or damage to adjacent structures.   All of the patient's  questions were answered, patient is agreeable to proceed.  Consent signed and in chart.       Thank you for this interesting consult.  I greatly enjoyed meeting Judeen D Amsden and look forward to participating in their care.  A copy of this report was sent to the requesting provider on this date.  Electronically Signed: D. Rowe Robert, PA-C 08/04/2022, 10:59 AM   I spent a total of  25 minutes   in face to face in clinical consultation, greater than 50% of which was counseling/coordinating care for left percutaneous nephrostomy/nephroureteral catheter placement

## 2022-08-05 ENCOUNTER — Encounter (HOSPITAL_COMMUNITY): Payer: Self-pay

## 2022-08-05 ENCOUNTER — Other Ambulatory Visit: Payer: Self-pay | Admitting: Urology

## 2022-08-05 ENCOUNTER — Ambulatory Visit (HOSPITAL_COMMUNITY)
Admission: RE | Admit: 2022-08-05 | Discharge: 2022-08-05 | Disposition: A | Discharge status: Home / Self Care | Payer: Medicare HMO | Source: Ambulatory Visit | Attending: Urology | Admitting: Urology

## 2022-08-05 DIAGNOSIS — N200 Calculus of kidney: Secondary | ICD-10-CM | POA: Insufficient documentation

## 2022-08-05 DIAGNOSIS — Z436 Encounter for attention to other artificial openings of urinary tract: Secondary | ICD-10-CM | POA: Diagnosis not present

## 2022-08-05 HISTORY — PX: IR  NEPHROURETERAL CATH PLACE LEFT: IMG6065

## 2022-08-05 LAB — CBC WITH DIFFERENTIAL/PLATELET
Abs Immature Granulocytes: 0.03 10*3/uL (ref 0.00–0.07)
Basophils Absolute: 0.1 10*3/uL (ref 0.0–0.1)
Basophils Relative: 1 %
Eosinophils Absolute: 0.3 10*3/uL (ref 0.0–0.5)
Eosinophils Relative: 3 %
HCT: 42 % (ref 36.0–46.0)
Hemoglobin: 13.9 g/dL (ref 12.0–15.0)
Immature Granulocytes: 0 %
Lymphocytes Relative: 29 %
Lymphs Abs: 2.4 10*3/uL (ref 0.7–4.0)
MCH: 29.8 pg (ref 26.0–34.0)
MCHC: 33.1 g/dL (ref 30.0–36.0)
MCV: 90.1 fL (ref 80.0–100.0)
Monocytes Absolute: 0.6 10*3/uL (ref 0.1–1.0)
Monocytes Relative: 7 %
Neutro Abs: 4.8 10*3/uL (ref 1.7–7.7)
Neutrophils Relative %: 60 %
Platelets: 258 10*3/uL (ref 150–400)
RBC: 4.66 MIL/uL (ref 3.87–5.11)
RDW: 13.2 % (ref 11.5–15.5)
WBC: 8.1 10*3/uL (ref 4.0–10.5)
nRBC: 0 % (ref 0.0–0.2)

## 2022-08-05 LAB — BASIC METABOLIC PANEL
Anion gap: 10 (ref 5–15)
BUN: 19 mg/dL (ref 6–20)
CO2: 23 mmol/L (ref 22–32)
Calcium: 9.1 mg/dL (ref 8.9–10.3)
Chloride: 106 mmol/L (ref 98–111)
Creatinine, Ser: 1.39 mg/dL — ABNORMAL HIGH (ref 0.44–1.00)
GFR, Estimated: 46 mL/min — ABNORMAL LOW (ref >60)
Glucose, Bld: 94 mg/dL (ref 70–99)
Potassium: 4 mmol/L (ref 3.5–5.1)
Sodium: 139 mmol/L (ref 135–145)

## 2022-08-05 LAB — PROTIME-INR
INR: 1.1 (ref 0.8–1.2)
Prothrombin Time: 13.9 seconds (ref 11.4–15.2)

## 2022-08-05 MED ORDER — MIDAZOLAM HCL 2 MG/2ML IJ SOLN
INTRAMUSCULAR | Status: AC
  Filled 2022-08-05: qty 2

## 2022-08-05 MED ORDER — ONDANSETRON HCL 4 MG/2ML IJ SOLN: 4.0000 mg | Freq: Once | INTRAMUSCULAR | Status: AC

## 2022-08-05 MED ORDER — SODIUM CHLORIDE 0.9 % IV SOLN: INTRAVENOUS | Status: DC

## 2022-08-05 MED ORDER — FENTANYL CITRATE (PF) 100 MCG/2ML IJ SOLN
INTRAMUSCULAR | Status: AC | PRN
  Administered 2022-08-05 (×6): 50 ug via INTRAVENOUS

## 2022-08-05 MED ORDER — OXYCODONE-ACETAMINOPHEN 5-325 MG PO TABS
ORAL_TABLET | ORAL | Status: AC
  Filled 2022-08-05: qty 2

## 2022-08-05 MED ORDER — SODIUM CHLORIDE 0.9 % IV SOLN
2.0000 g | INTRAVENOUS | Status: AC
  Administered 2022-08-05: 2 g via INTRAVENOUS
  Filled 2022-08-05: qty 20

## 2022-08-05 MED ORDER — IOHEXOL 300 MG/ML  SOLN
50.0000 mL | Freq: Once | INTRAMUSCULAR | Status: AC | PRN
  Administered 2022-08-05: 20 mL

## 2022-08-05 MED ORDER — FENTANYL CITRATE (PF) 100 MCG/2ML IJ SOLN
INTRAMUSCULAR | Status: AC
  Filled 2022-08-05: qty 2

## 2022-08-05 MED ORDER — OXYCODONE-ACETAMINOPHEN 5-325 MG PO TABS: 1.0000 | ORAL_TABLET | Freq: Four times a day (QID) | ORAL | 0 refills | Status: DC | PRN

## 2022-08-05 MED ORDER — LIDOCAINE-EPINEPHRINE 1 %-1:100000 IJ SOLN
INTRAMUSCULAR | Status: AC
  Administered 2022-08-05: 15 mL
  Filled 2022-08-05: qty 1

## 2022-08-05 MED ORDER — ONDANSETRON HCL 4 MG/2ML IJ SOLN
INTRAMUSCULAR | Status: AC
  Administered 2022-08-05: 4 mg via INTRAVENOUS
  Filled 2022-08-05: qty 2

## 2022-08-05 MED ORDER — MIDAZOLAM HCL 2 MG/2ML IJ SOLN
INTRAMUSCULAR | Status: AC | PRN
  Administered 2022-08-05 (×6): 1 mg via INTRAVENOUS

## 2022-08-05 MED ORDER — OXYCODONE-ACETAMINOPHEN 5-325 MG PO TABS
2.0000 | ORAL_TABLET | Freq: Once | ORAL | Status: AC
  Administered 2022-08-05: 2 via ORAL

## 2022-08-05 MED ORDER — DIPHENHYDRAMINE HCL 50 MG/ML IJ SOLN
INTRAMUSCULAR | Status: AC
  Filled 2022-08-05: qty 1

## 2022-08-05 MED ORDER — DIPHENHYDRAMINE HCL 50 MG/ML IJ SOLN
INTRAMUSCULAR | Status: AC | PRN
  Administered 2022-08-05: 50 mg via INTRAVENOUS

## 2022-08-05 NOTE — Discharge Instructions (Addendum)
Please call Interventional Radiology clinic [phone] ([email or web address]) with any questions or concerns.  Confirm appointment with Dr Noland Fordyce office for tomorrow for stent removal.  Call Dr Alyson Ingles or MD on call for urology service [phone] if your should suspect acute infection with fever (100.5 or greater) or other symptoms.  Expect to have discomfort while Percutaneous Nephrostomy tube remains in place.  Take pain medication as prescribed.    Maintain and empty drain bag as directed.  May continue to have bloody drainage.     Percutaneous Nephrostomy, Care After This sheet gives you information about how to care for yourself after your procedure. Your health care provider may also give you more specific instructions. If you have problems or questions, contact your health careprovider. What can I expect after the procedure? After the procedure, it is common to have: Some soreness where the nephrostomy tube was inserted (tube insertion site). Blood-tinged drainage from the nephrostomy tube for the first 24 hours. Follow these instructions at home: Activity  Do not lift anything that is heavier than 10 lb (4.5 kg), or the limit that you are told, until your health care provider says that it is safe. Return to your normal activities as told by your health care provider. Ask your health care provider what activities are safe for you. Avoid activities that may cause the nephrostomy tubing to bend. Do not take baths, swim, or use a hot tub until your health care provider approves. Ask your health care provider if you can take showers. Cover the nephrostomy tube bandage (dressing) with a watertight covering when you take a shower. If you were given a sedative during the procedure, it can affect you for several hours. Do not drive or operate machinery until your health care provider says that it is safe.   Care of the tube insertion site  Follow instructions from your health care provider  about how to take care of your tube insertion site. Make sure you: Wash your hands with soap and water for at least 20 seconds before you change your dressing. If soap and water are not available, use hand sanitizer. Change your dressing as told by your health care provider. Be careful not to pull on the tube while removing the dressing. When you change the dressing, wash the skin around the tube, rinse well, and pat the skin dry. Check the tube insertion area every day for signs of infection. Check for: Redness, swelling, or pain. Fluid or blood. Warmth. Pus or a bad smell.   Care of the nephrostomy tube and drainage bag Always keep the tubing, the leg bag, or the bedside drainage bags below the level of the kidney so that your urine drains freely. When connecting your nephrostomy tube to a drainage bag, make sure that there are no kinks in the tubing and that your urine is draining freely. You may want to use an elastic bandage to wrap any exposed tubing that goes from the nephrostomy tube to any of the connecting tubes. At night, you may want to connect your nephrostomy tube or the leg bag to a larger bedside drainage bag. Follow instructions from your health care provider about how to empty or change the drainage bag. Empty the drainage bag when it becomes ? full. Replace the drainage bag and any extension tubing that is connected to your nephrostomy tube every 7 days or as told by your health care provider. Your health care provider will explain how to change the drainage bag and extension  tubing. General instructions Take over-the-counter and prescription medicines only as told by your health care provider. Keep all follow-up visits as told by your health care provider. This is important. The nephrostomy tube will need to be changed every 8-12 weeks. Contact a health care provider if: You have problems with any of the valves or tubing. You have persistent pain or soreness in your  back. You have redness, swelling, or pain around your tube insertion site. You have fluid or blood coming from your tube insertion site. Your tube insertion site feels warm to the touch. You have pus or a bad smell coming from your tube insertion site. You have increased urine output or you feel burning when urinating. Get help right away if: You have pain in your abdomen during the first week. You have chest pain or have trouble breathing. You have a new appearance of blood in your urine. You have a fever or chills. You have back pain that is not relieved by your medicine. You have decreased urine output. Your nephrostomy tube comes out. Summary After the procedure, it is common to have some soreness where the nephrostomy tube was inserted (tube insertion site). Follow instructions from your health care provider about how to take care of your tube insertion site, nephrostomy tube, and drainage bag. Keep all follow-up visits for care and for changing the tube. This information is not intended to replace advice given to you by your health care provider. Make sure you discuss any questions you have with your healthcare provider. Document Revised: 06/14/2019 Document Reviewed: 06/14/2019 Elsevier Patient Education  2022 [place].   Moderate Conscious Sedation, Adult, Care After This sheet gives you information about how to care for yourself after your procedure. Your health care provider may also give you more specific instructions. If you have problems or questions, contact your health care provider. What can I expect after the procedure? After the procedure, it is common to have: Sleepiness for several hours. Impaired judgment for several hours. Difficulty with balance. Vomiting if you eat too soon. Follow these instructions at home: For the time period you were told by your health care provider: Rest. Do not participate in activities where you could fall or become injured. Do  not drive or use machinery. Do not drink alcohol. Do not take sleeping pills or medicines that cause drowsiness. Do not make important decisions or sign legal documents. Do not take care of children on your own.      Eating and drinking Follow the diet recommended by your health care provider. Drink enough fluid to keep your urine pale yellow. If you vomit: Drink water, juice, or soup when you can drink without vomiting. Make sure you have little or no nausea before eating solid foods.   General instructions Take over-the-counter and prescription medicines only as told by your health care provider. Have a responsible adult stay with you for the time you are told. It is important to have someone help care for you until you are awake and alert. Do not smoke. Keep all follow-up visits as told by your health care provider. This is important. Contact a health care provider if: You are still sleepy or having trouble with balance after 24 hours. You feel light-headed. You keep feeling nauseous or you keep vomiting. You develop a rash. You have a fever. You have redness or swelling around the IV site. Get help right away if: You have trouble breathing. You have new-onset confusion at home. Summary After  the procedure, it is common to feel sleepy, have impaired judgment, or feel nauseous if you eat too soon. Rest after you get home. Know the things you should not do after the procedure. Follow the diet recommended by your health care provider and drink enough fluid to keep your urine pale yellow. Get help right away if you have trouble breathing or new-onset confusion at home. This information is not intended to replace advice given to you by your health care provider. Make sure you discuss any questions you have with your health care provider. Document Revised: 09/16/2019 Document Reviewed: 04/14/2019 Elsevier Patient Education  2021 [employer].

## 2022-08-05 NOTE — Procedures (Signed)
Pre Procedure Dx: Left sided nephrolithiasis.  Post Procedural Dx: Same  Successful [place] and fluoroscopic guided placement of a left sided nephroureteral catheter with superior coil  within the renal pelvis and inferior coil within the urinary bladder.   Nephroureteral catheter connected to gravity bag.  EBL: Minimal Complications: Inferior displacement of pre-existing ureteral stent to the urinary bladder; Retain tip of Nitrex wire likely within the renal collecting system during one of the attempted accesses.  Both d/w Dr. Alyson Ingles at the time of procedure completion.   Ronny Bacon, MD Pager #: [phone]

## 2022-08-05 NOTE — Progress Notes (Signed)
Patient ID: [name], female   DOB: 11-14-1970, 52 y.o.   MRN: [id number] Per order of Dr. Pascal Lux, electronic prescription sent to pt's pharmacy for percocet 5/325, 1-2 tabs every 6 hours for moderate pain,#10, no refills

## 2022-08-06 ENCOUNTER — Other Ambulatory Visit: Payer: Self-pay | Admitting: Urology

## 2022-08-06 ENCOUNTER — Ambulatory Visit: Payer: Medicare HMO | Admitting: Urology

## 2022-08-06 ENCOUNTER — Telehealth: Payer: Self-pay

## 2022-08-06 VITALS — BP 102/70 | HR 94 | Ht 62.0 in | Wt 200.6 lb

## 2022-08-06 DIAGNOSIS — N200 Calculus of kidney: Secondary | ICD-10-CM

## 2022-08-06 DIAGNOSIS — Z87442 Personal history of urinary calculi: Secondary | ICD-10-CM | POA: Diagnosis not present

## 2022-08-06 DIAGNOSIS — Q6433 Congenital stricture of urinary meatus: Secondary | ICD-10-CM

## 2022-08-06 NOTE — Progress Notes (Unsigned)
   08/06/22  CC: nephrolithiasis   HPI: [name] is a 52yo here for stent removal Blood pressure 102/70, pulse 94, height '5\' 2"'$  (1.575 m), weight 200 lb 9.6 oz (91 kg). NED. A&Ox3.   No respiratory distress   Abd soft, NT, ND Normal external genitalia with patent urethral meatus  Cystoscopy Procedure Note  Patient identification was confirmed, informed consent was obtained, and patient was prepped using Betadine solution.  Lidocaine jelly was administered per urethral meatus.    Procedure: - Flexible cystoscope introduced, without any difficulty.   - Thorough search of the bladder revealed:    normal urethral meatus    normal urothelium    no stones    no ulcers     no tumors    no urethral polyps    no trabeculation  - Ureteral orifices were normal in position and appearance. Using a grasper the stent was removed intact Post-Procedure: - Patient tolerated the procedure well  Assessment/ Plan: Patient to proceed with PCNL tomorrow   No follow-ups on file.  Nicolette Bang, MD

## 2022-08-06 NOTE — Telephone Encounter (Signed)
Patient needing to verify time of surgery for Thurs. 08-07-2022.  Has other dates and times, needing to make sure of correct one.  Call back:  [phone]

## 2022-08-07 ENCOUNTER — Ambulatory Visit (HOSPITAL_COMMUNITY): Payer: Medicare HMO

## 2022-08-07 ENCOUNTER — Encounter (HOSPITAL_COMMUNITY)
Admission: RE | Disposition: A | Discharge status: Home / Self Care | Payer: Self-pay | Source: Home / Self Care | Attending: Urology

## 2022-08-07 ENCOUNTER — Encounter (HOSPITAL_COMMUNITY): Payer: Self-pay | Admitting: Urology

## 2022-08-07 ENCOUNTER — Encounter: Payer: Self-pay | Admitting: Urology

## 2022-08-07 ENCOUNTER — Ambulatory Visit (HOSPITAL_BASED_OUTPATIENT_CLINIC_OR_DEPARTMENT_OTHER): Payer: Medicare HMO | Admitting: Anesthesiology

## 2022-08-07 ENCOUNTER — Ambulatory Visit (HOSPITAL_COMMUNITY): Payer: Medicare HMO | Admitting: Anesthesiology

## 2022-08-07 ENCOUNTER — Other Ambulatory Visit: Payer: Self-pay

## 2022-08-07 ENCOUNTER — Observation Stay (HOSPITAL_COMMUNITY)
Admission: RE | Admit: 2022-08-07 | Discharge: 2022-08-08 | Disposition: A | Discharge status: Home / Self Care | Payer: Medicare HMO | Attending: Urology | Admitting: Urology

## 2022-08-07 DIAGNOSIS — Z01818 Encounter for other preprocedural examination: Secondary | ICD-10-CM

## 2022-08-07 DIAGNOSIS — F418 Other specified anxiety disorders: Secondary | ICD-10-CM | POA: Diagnosis not present

## 2022-08-07 DIAGNOSIS — N183 Chronic kidney disease, stage 3 unspecified: Secondary | ICD-10-CM | POA: Diagnosis not present

## 2022-08-07 DIAGNOSIS — I10 Essential (primary) hypertension: Secondary | ICD-10-CM | POA: Diagnosis not present

## 2022-08-07 DIAGNOSIS — N200 Calculus of kidney: Secondary | ICD-10-CM | POA: Diagnosis not present

## 2022-08-07 DIAGNOSIS — I129 Hypertensive chronic kidney disease with stage 1 through stage 4 chronic kidney disease, or unspecified chronic kidney disease: Secondary | ICD-10-CM | POA: Insufficient documentation

## 2022-08-07 DIAGNOSIS — Z79899 Other long term (current) drug therapy: Secondary | ICD-10-CM | POA: Diagnosis not present

## 2022-08-07 DIAGNOSIS — Z87891 Personal history of nicotine dependence: Secondary | ICD-10-CM | POA: Diagnosis not present

## 2022-08-07 DIAGNOSIS — E039 Hypothyroidism, unspecified: Secondary | ICD-10-CM | POA: Diagnosis not present

## 2022-08-07 HISTORY — PX: NEPHROLITHOTOMY: SHX5134

## 2022-08-07 LAB — BASIC METABOLIC PANEL
Anion gap: 11 (ref 5–15)
BUN: 17 mg/dL (ref 6–20)
CO2: 21 mmol/L — ABNORMAL LOW (ref 22–32)
Calcium: 8.7 mg/dL — ABNORMAL LOW (ref 8.9–10.3)
Chloride: 103 mmol/L (ref 98–111)
Creatinine, Ser: 1.46 mg/dL — ABNORMAL HIGH (ref 0.44–1.00)
GFR, Estimated: 43 mL/min — ABNORMAL LOW (ref >60)
Glucose, Bld: 107 mg/dL — ABNORMAL HIGH (ref 70–99)
Potassium: 4 mmol/L (ref 3.5–5.1)
Sodium: 135 mmol/L (ref 135–145)

## 2022-08-07 LAB — CBC
HCT: 42.6 % (ref 36.0–46.0)
Hemoglobin: 13.7 g/dL (ref 12.0–15.0)
MCH: 30.3 pg (ref 26.0–34.0)
MCHC: 32.2 g/dL (ref 30.0–36.0)
MCV: 94.2 fL (ref 80.0–100.0)
Platelets: 209 10*3/uL (ref 150–400)
RBC: 4.52 MIL/uL (ref 3.87–5.11)
RDW: 13.2 % (ref 11.5–15.5)
WBC: 7.8 10*3/uL (ref 4.0–10.5)
nRBC: 0 % (ref 0.0–0.2)

## 2022-08-07 LAB — POCT PREGNANCY, URINE: Preg Test, Ur: NEGATIVE

## 2022-08-07 SURGERY — NEPHROLITHOTOMY PERCUTANEOUS
Anesthesia: General | Site: Renal | Laterality: Left

## 2022-08-07 MED ORDER — ORAL CARE MOUTH RINSE: 15.0000 mL | Freq: Once | OROMUCOSAL | Status: AC

## 2022-08-07 MED ORDER — AMLODIPINE BESYLATE 5 MG PO TABS
5.0000 mg | ORAL_TABLET | Freq: Every day | ORAL | Status: DC
  Filled 2022-08-07: qty 1

## 2022-08-07 MED ORDER — DEXMEDETOMIDINE HCL IN NACL 80 MCG/20ML IV SOLN
INTRAVENOUS | Status: DC | PRN
  Administered 2022-08-07: 8 ug via BUCCAL
  Administered 2022-08-07: 4 ug via BUCCAL
  Administered 2022-08-07: 8 ug via BUCCAL

## 2022-08-07 MED ORDER — SENNOSIDES-DOCUSATE SODIUM 8.6-50 MG PO TABS
2.0000 | ORAL_TABLET | Freq: Every day | ORAL | Status: DC
  Administered 2022-08-07: 2 via ORAL
  Filled 2022-08-07: qty 2

## 2022-08-07 MED ORDER — HYDROMORPHONE HCL 1 MG/ML IJ SOLN
0.5000 mg | INTRAMUSCULAR | Status: DC | PRN
  Administered 2022-08-07: 0.5 mg via INTRAVENOUS
  Filled 2022-08-07: qty 0.5

## 2022-08-07 MED ORDER — PHENYLEPHRINE HCL-NACL 20-0.9 MG/250ML-% IV SOLN
INTRAVENOUS | Status: DC | PRN
  Administered 2022-08-07: 30 ug/min via INTRAVENOUS

## 2022-08-07 MED ORDER — SODIUM CHLORIDE 0.9 % IR SOLN
Status: DC | PRN
  Administered 2022-08-07 (×2): 3000 mL

## 2022-08-07 MED ORDER — STERILE WATER FOR IRRIGATION IR SOLN
Status: DC | PRN
  Administered 2022-08-07: 500 mL

## 2022-08-07 MED ORDER — SODIUM CHLORIDE 0.9 % IR SOLN
Status: DC | PRN
  Administered 2022-08-07: 3000 mL

## 2022-08-07 MED ORDER — DIPHENHYDRAMINE HCL 50 MG/ML IJ SOLN: 12.5000 mg | Freq: Four times a day (QID) | INTRAMUSCULAR | Status: DC | PRN

## 2022-08-07 MED ORDER — FENTANYL CITRATE PF 50 MCG/ML IJ SOSY
PREFILLED_SYRINGE | INTRAMUSCULAR | Status: AC
  Filled 2022-08-07: qty 1

## 2022-08-07 MED ORDER — FENTANYL CITRATE (PF) 100 MCG/2ML IJ SOLN
INTRAMUSCULAR | Status: DC | PRN
  Administered 2022-08-07 (×4): 50 ug via INTRAVENOUS

## 2022-08-07 MED ORDER — LIDOCAINE HCL (PF) 2 % IJ SOLN
INTRAMUSCULAR | Status: AC
  Filled 2022-08-07: qty 5

## 2022-08-07 MED ORDER — LOSARTAN POTASSIUM 50 MG PO TABS
25.0000 mg | ORAL_TABLET | Freq: Every morning | ORAL | Status: DC
  Filled 2022-08-07 (×2): qty 1

## 2022-08-07 MED ORDER — ATORVASTATIN CALCIUM 20 MG PO TABS
20.0000 mg | ORAL_TABLET | Freq: Every day | ORAL | Status: DC
  Filled 2022-08-07: qty 1

## 2022-08-07 MED ORDER — PROPOFOL 10 MG/ML IV BOLUS
INTRAVENOUS | Status: DC | PRN
  Administered 2022-08-07: 200 mg via INTRAVENOUS

## 2022-08-07 MED ORDER — VALBENAZINE TOSYLATE 60 MG PO CAPS
60.0000 mg | ORAL_CAPSULE | Freq: Every day | ORAL | Status: DC
  Filled 2022-08-07 (×3): qty 1

## 2022-08-07 MED ORDER — QUETIAPINE FUMARATE ER 50 MG PO TB24
400.0000 mg | ORAL_TABLET | Freq: Every day | ORAL | Status: DC
  Administered 2022-08-07: 400 mg via ORAL

## 2022-08-07 MED ORDER — CHLORTHALIDONE 25 MG PO TABS
12.5000 mg | ORAL_TABLET | Freq: Every day | ORAL | Status: DC
  Filled 2022-08-07 (×2): qty 1

## 2022-08-07 MED ORDER — MIDAZOLAM HCL 2 MG/2ML IJ SOLN
INTRAMUSCULAR | Status: AC
  Filled 2022-08-07: qty 2

## 2022-08-07 MED ORDER — ROCURONIUM BROMIDE 10 MG/ML (PF) SYRINGE
PREFILLED_SYRINGE | INTRAVENOUS | Status: AC
  Filled 2022-08-07: qty 10

## 2022-08-07 MED ORDER — HYDROMORPHONE HCL 1 MG/ML IJ SOLN
INTRAMUSCULAR | Status: AC
  Filled 2022-08-07: qty 0.5

## 2022-08-07 MED ORDER — ONDANSETRON HCL 4 MG/2ML IJ SOLN: 4.0000 mg | Freq: Once | INTRAMUSCULAR | Status: DC | PRN

## 2022-08-07 MED ORDER — DIATRIZOATE MEGLUMINE 30 % UR SOLN
URETHRAL | Status: DC | PRN
  Administered 2022-08-07: 20 mL via URETHRAL

## 2022-08-07 MED ORDER — CARVEDILOL 12.5 MG PO TABS
25.0000 mg | ORAL_TABLET | Freq: Two times a day (BID) | ORAL | Status: DC
  Administered 2022-08-07: 25 mg via ORAL
  Filled 2022-08-07 (×2): qty 2

## 2022-08-07 MED ORDER — ALPRAZOLAM 1 MG PO TABS: 1.0000 mg | ORAL_TABLET | Freq: Three times a day (TID) | ORAL | Status: DC | PRN

## 2022-08-07 MED ORDER — GLYCOPYRROLATE PF 0.2 MG/ML IJ SOSY
PREFILLED_SYRINGE | INTRAMUSCULAR | Status: DC | PRN
  Administered 2022-08-07: .2 mg via INTRAVENOUS

## 2022-08-07 MED ORDER — ONDANSETRON HCL 4 MG/2ML IJ SOLN
INTRAMUSCULAR | Status: DC | PRN
  Administered 2022-08-07: 4 mg via INTRAVENOUS

## 2022-08-07 MED ORDER — PHENYLEPHRINE 80 MCG/ML (10ML) SYRINGE FOR IV PUSH (FOR BLOOD PRESSURE SUPPORT)
PREFILLED_SYRINGE | INTRAVENOUS | Status: DC | PRN
  Administered 2022-08-07 (×3): 160 ug via INTRAVENOUS

## 2022-08-07 MED ORDER — MIDAZOLAM HCL 5 MG/5ML IJ SOLN
INTRAMUSCULAR | Status: DC | PRN
  Administered 2022-08-07: 2 mg via INTRAVENOUS

## 2022-08-07 MED ORDER — EPHEDRINE SULFATE-NACL 50-0.9 MG/10ML-% IV SOSY
PREFILLED_SYRINGE | INTRAVENOUS | Status: DC | PRN
  Administered 2022-08-07: 5 mg via INTRAVENOUS

## 2022-08-07 MED ORDER — HYDROMORPHONE HCL 1 MG/ML IJ SOLN
0.5000 mg | INTRAMUSCULAR | Status: DC | PRN
  Administered 2022-08-07 (×2): 0.5 mg via INTRAVENOUS
  Filled 2022-08-07: qty 0.5

## 2022-08-07 MED ORDER — OXYCODONE HCL 5 MG PO TABS: 5.0000 mg | ORAL_TABLET | Freq: Once | ORAL | Status: DC | PRN

## 2022-08-07 MED ORDER — DEXAMETHASONE SODIUM PHOSPHATE 10 MG/ML IJ SOLN
INTRAMUSCULAR | Status: DC | PRN
  Administered 2022-08-07: 5 mg via INTRAVENOUS

## 2022-08-07 MED ORDER — PHENYLEPHRINE HCL-NACL 20-0.9 MG/250ML-% IV SOLN
INTRAVENOUS | Status: AC
  Filled 2022-08-07: qty 250

## 2022-08-07 MED ORDER — ACETAMINOPHEN 325 MG PO TABS: 650.0000 mg | ORAL_TABLET | ORAL | Status: DC | PRN

## 2022-08-07 MED ORDER — DIATRIZOATE MEGLUMINE 30 % UR SOLN
URETHRAL | Status: AC
  Filled 2022-08-07: qty 100

## 2022-08-07 MED ORDER — FENTANYL CITRATE PF 50 MCG/ML IJ SOSY
50.0000 ug | PREFILLED_SYRINGE | Freq: Once | INTRAMUSCULAR | Status: AC
  Administered 2022-08-07: 50 ug via INTRAVENOUS

## 2022-08-07 MED ORDER — PROPOFOL 10 MG/ML IV BOLUS
INTRAVENOUS | Status: AC
  Filled 2022-08-07: qty 20

## 2022-08-07 MED ORDER — FENTANYL CITRATE (PF) 250 MCG/5ML IJ SOLN
INTRAMUSCULAR | Status: AC
  Filled 2022-08-07: qty 5

## 2022-08-07 MED ORDER — OXYCODONE HCL 5 MG/5ML PO SOLN: 5.0000 mg | Freq: Once | ORAL | Status: DC | PRN

## 2022-08-07 MED ORDER — DIPHENHYDRAMINE HCL 12.5 MG/5ML PO ELIX: 12.5000 mg | ORAL_SOLUTION | Freq: Four times a day (QID) | ORAL | Status: DC | PRN

## 2022-08-07 MED ORDER — ROCURONIUM BROMIDE 10 MG/ML (PF) SYRINGE
PREFILLED_SYRINGE | INTRAVENOUS | Status: DC | PRN
  Administered 2022-08-07: 50 mg via INTRAVENOUS

## 2022-08-07 MED ORDER — LIDOCAINE 2% (20 MG/ML) 5 ML SYRINGE
INTRAMUSCULAR | Status: DC | PRN
  Administered 2022-08-07: 100 mg via INTRAVENOUS

## 2022-08-07 MED ORDER — LAMOTRIGINE 100 MG PO TABS
200.0000 mg | ORAL_TABLET | Freq: Two times a day (BID) | ORAL | Status: DC
  Administered 2022-08-07: 200 mg via ORAL
  Filled 2022-08-07 (×2): qty 2

## 2022-08-07 MED ORDER — LACTATED RINGERS IV SOLN
INTRAVENOUS | Status: DC
  Administered 2022-08-07: 1000 mL via INTRAVENOUS

## 2022-08-07 MED ORDER — CHLORHEXIDINE GLUCONATE 0.12 % MT SOLN
15.0000 mL | Freq: Once | OROMUCOSAL | Status: AC
  Administered 2022-08-07: 15 mL via OROMUCOSAL

## 2022-08-07 MED ORDER — OXYCODONE HCL 5 MG PO TABS
5.0000 mg | ORAL_TABLET | ORAL | Status: DC | PRN
  Administered 2022-08-07: 5 mg via ORAL
  Filled 2022-08-07 (×2): qty 1

## 2022-08-07 MED ORDER — ONDANSETRON HCL 4 MG/2ML IJ SOLN
INTRAMUSCULAR | Status: AC
  Filled 2022-08-07: qty 2

## 2022-08-07 MED ORDER — SUGAMMADEX SODIUM 200 MG/2ML IV SOLN
INTRAVENOUS | Status: DC | PRN
  Administered 2022-08-07: 200 mg via INTRAVENOUS

## 2022-08-07 MED ORDER — ONDANSETRON HCL 4 MG/2ML IJ SOLN: 4.0000 mg | INTRAMUSCULAR | Status: DC | PRN

## 2022-08-07 MED ORDER — ARIPIPRAZOLE 5 MG PO TABS
5.0000 mg | ORAL_TABLET | Freq: Every day | ORAL | Status: DC
  Administered 2022-08-07: 5 mg via ORAL
  Filled 2022-08-07: qty 1

## 2022-08-07 MED ORDER — GLYCOPYRROLATE PF 0.2 MG/ML IJ SOSY
PREFILLED_SYRINGE | INTRAMUSCULAR | Status: AC
  Filled 2022-08-07: qty 1

## 2022-08-07 MED ORDER — DOXEPIN HCL 25 MG PO CAPS
225.0000 mg | ORAL_CAPSULE | Freq: Every day | ORAL | Status: DC
  Administered 2022-08-07: 225 mg via ORAL
  Filled 2022-08-07: qty 9

## 2022-08-07 MED ORDER — OXYBUTYNIN CHLORIDE 5 MG PO TABS: 5.0000 mg | ORAL_TABLET | Freq: Three times a day (TID) | ORAL | Status: DC | PRN

## 2022-08-07 MED ORDER — FENTANYL CITRATE PF 50 MCG/ML IJ SOSY
25.0000 ug | PREFILLED_SYRINGE | INTRAMUSCULAR | Status: DC | PRN
  Administered 2022-08-07 (×2): 50 ug via INTRAVENOUS
  Filled 2022-08-07 (×2): qty 1

## 2022-08-07 MED ORDER — CEFAZOLIN SODIUM-DEXTROSE 2-4 GM/100ML-% IV SOLN
INTRAVENOUS | Status: AC
  Filled 2022-08-07: qty 100

## 2022-08-07 MED ORDER — FENTANYL CITRATE PF 50 MCG/ML IJ SOSY
PREFILLED_SYRINGE | INTRAMUSCULAR | Status: AC
  Administered 2022-08-07: 50 ug via INTRAVENOUS
  Filled 2022-08-07: qty 1

## 2022-08-07 MED ORDER — CEFAZOLIN SODIUM-DEXTROSE 2-4 GM/100ML-% IV SOLN
2.0000 g | INTRAVENOUS | Status: AC
  Administered 2022-08-07: 2 g via INTRAVENOUS

## 2022-08-07 SURGICAL SUPPLY — 63 items
APL PRP STRL LF DISP 70% ISPRP (MISCELLANEOUS) ×2
APL SKNCLS STERI-STRIP NONHPOA (GAUZE/BANDAGES/DRESSINGS) ×4
BAG DRN RND TRDRP ANRFLXCHMBR (UROLOGICAL SUPPLIES) ×2
BAG URINE DRAIN 2000ML AR STRL (UROLOGICAL SUPPLIES) ×2 IMPLANT
BASKET ZERO TIP NITINOL 2.4FR (BASKET) ×1 IMPLANT
BENZOIN TINCTURE PRP APPL 2/3 (GAUZE/BANDAGES/DRESSINGS) ×4 IMPLANT
BLADE SURG 15 STRL LF DISP TIS (BLADE) ×2 IMPLANT
BLADE SURG 15 STRL SS (BLADE) ×2
BSKT STON RTRVL ZERO TP 2.4FR (BASKET) ×2
CATCHER STONE W/TUBE ADAPTER (UROLOGICAL SUPPLIES) ×1 IMPLANT
CATH FOLEY 2W COUNCIL 20FR 5CC (CATHETERS) IMPLANT
CATH FOLEY 2W COUNCIL 5CC 18FR (CATHETERS) ×1 IMPLANT
CATH FOLEY 2WAY SLVR  5CC 16FR (CATHETERS)
CATH FOLEY 2WAY SLVR  5CC 18FR (CATHETERS) ×2
CATH FOLEY 2WAY SLVR 5CC 16FR (CATHETERS) IMPLANT
CATH FOLEY 2WAY SLVR 5CC 18FR (CATHETERS) ×2 IMPLANT
CATH INTERMIT  6FR 70CM (CATHETERS) ×2 IMPLANT
CATH UROLOGY TORQUE 65 (CATHETERS) ×2 IMPLANT
CATH X-FORCE N30 NEPHROSTOMY (TUBING) ×2 IMPLANT
CHLORAPREP W/TINT 26 (MISCELLANEOUS) ×1 IMPLANT
COVER LIGHT HANDLE STERIS (MISCELLANEOUS) ×4 IMPLANT
DRAPE C-ARM FOLDED MOBILE STRL (DRAPES) ×2 IMPLANT
DRAPE HALF SHEET 40X57 (DRAPES) ×2 IMPLANT
DRAPE LINGEMAN PERC (DRAPES) ×2 IMPLANT
DRSG PAD ABDOMINAL 8X10 ST (GAUZE/BANDAGES/DRESSINGS) ×4 IMPLANT
DRSG TEGADERM 8X12 (GAUZE/BANDAGES/DRESSINGS) ×5 IMPLANT
FIBER LASER TRAC TIP (UROLOGICAL SUPPLIES) IMPLANT
GAUZE 4X4 16PLY ~~LOC~~+RFID DBL (SPONGE) ×2 IMPLANT
GAUZE PAD ABD 8X10 STRL (GAUZE/BANDAGES/DRESSINGS) ×1 IMPLANT
GAUZE SPONGE 4X4 12PLY STRL (GAUZE/BANDAGES/DRESSINGS) ×2 IMPLANT
GLOVE BIO SURGEON STRL SZ8 (GLOVE) ×2 IMPLANT
GLOVE BIOGEL PI IND STRL 7.0 (GLOVE) ×4 IMPLANT
GLOVE BIOGEL PI IND STRL 8 (GLOVE) ×1 IMPLANT
GOWN STRL REUS W/TWL LRG LVL3 (GOWN DISPOSABLE) ×2 IMPLANT
GOWN STRL REUS W/TWL XL LVL3 (GOWN DISPOSABLE) ×2 IMPLANT
GUIDEWIRE AMPLAZ .035X145 (WIRE) ×2 IMPLANT
GUIDEWIRE STR DUAL SENSOR (WIRE) ×2 IMPLANT
IV NS 500ML (IV SOLUTION) ×2
IV NS 500ML BAXH (IV SOLUTION) ×2 IMPLANT
IV NS IRRIG 3000ML ARTHROMATIC (IV SOLUTION) ×4 IMPLANT
KIT BLADEGUARD II DBL (SET/KITS/TRAYS/PACK) ×2 IMPLANT
KIT PROBE TRILOGY 3.9X350 (MISCELLANEOUS) ×2 IMPLANT
KIT TURNOVER KIT A (KITS) ×2 IMPLANT
MANIFOLD NEPTUNE II (INSTRUMENTS) ×2 IMPLANT
PACK CYSTO (CUSTOM PROCEDURE TRAY) ×2 IMPLANT
PAD ARMBOARD 7.5X6 YLW CONV (MISCELLANEOUS) ×2 IMPLANT
POSITIONER HEAD PRONE TRACH (MISCELLANEOUS) ×2 IMPLANT
PROBE PNEUMATIC 1.0MMX570MM (UROLOGICAL SUPPLIES) ×1 IMPLANT
SET AMPLATZ RENAL DILATOR (MISCELLANEOUS) IMPLANT
SET BASIN LINEN APH (SET/KITS/TRAYS/PACK) ×2 IMPLANT
SET IRRIG Y TYPE TUR BLADDER L (SET/KITS/TRAYS/PACK) ×1 IMPLANT
SHEATH COOK PEEL AWAY SET 9F (SHEATH) ×1 IMPLANT
SHEATH PEELAWAY SET 9 (SHEATH) ×2 IMPLANT
STENT URET 6FRX26 CONTOUR (STENTS) ×1 IMPLANT
STONE CATCHER W/TUBE ADAPTER (UROLOGICAL SUPPLIES) ×2 IMPLANT
SUT SILK 2 0 SH (SUTURE) ×2 IMPLANT
SYR 50ML LL SCALE MARK (SYRINGE) ×2 IMPLANT
TRACTIP FLEXIVA PULS ID 200XHI (Laser) ×1 IMPLANT
TRACTIP FLEXIVA PULSE ID 200 (Laser) ×2
TRAY FOLEY W/BAG SLVR 16FR (SET/KITS/TRAYS/PACK) ×2
TRAY FOLEY W/BAG SLVR 16FR ST (SET/KITS/TRAYS/PACK) ×2 IMPLANT
WATER STERILE IRR 1000ML POUR (IV SOLUTION) ×2 IMPLANT
YANKAUER SUCT BULB TIP 10FT TU (MISCELLANEOUS) ×4 IMPLANT

## 2022-08-07 NOTE — Patient Instructions (Signed)

## 2022-08-07 NOTE — Op Note (Signed)
Preoperative diagnosis: Left renal stone  Postoperative diagnosis: Same  Procedure 1.  Left percutaneous nephrostolithotomy for stone less than 2 cm 2.  Left nephrostogram 3.  Intraoperative fluoroscopy, under 1 hour, with interpretation 4.  Placement of a 6 x 26 double-J ureteral stent. 5.   Placement of a 60 French nephrostomy tube 6.  Dilation of percutaneous tract 7.  Foreign body removal  Attending: Dr. Alyson Ingles  Anesthesia: General  Estimated blood loss: Minimal  Antibiotics: ancef  Drains: 1.  16 French Foley catheter 2.  6 x 26 left double-J ureteral stent 3.  18 French nephrostomy tube  Specimens: Stone for analysis  Findings: 1.5cm lower pole calculus. Wire fragment removed from renal pelvis. Minimal extravasation of contrast after PCNL  Indications: Patient is a 52 year old female with a history of a large left renal stone what was unable to be removed through ureteroscopy.  After discussing treatment options and decided she was left percutaneous nephrostolithotomy.  Patient already has a nephrostomy tube.  Procedure in detail: Prior to procedure consent was obtained.  Patient was brought to the operating room debridement was done to ensure correct patient, correct procedure, and correct site.  General anesthesia was administered.  A 16 French Foley catheter was in place.  The patient was then placed in the prone position.  His nephrostomy tube and left flank was then prepped and draped in usual sterile fashion.  A nephrostogram was obtained and findings noted above.  Through the nephrostomy tube we then placed a sensor wire.  Sensor wire was coiled in the bladder we then removed the nephrostomy tube. We then used a dual lumen catheter to place a superstiff wire from the renal pelvis and down to the bladder.  We then made an incision at the level of the skin and over the wire we then placed a NephroMax dilator.  We dilated the nephrostomy tract to 30 [place] and held this 18 cm  of water for 1 minute.  We then  placed the access sheath over the balloon.  The balloon was then deflated.  We then used a rigid nephroscope to perform nephroscopy.  We encountered a large renal pelvis stone that was at the ureteropelvic junction. Using the trilogy the stone was fragmented and removed. We then used a grasper to remove the wire fragment the occurred when her nephrostomy tube was placed.  We then elected to place a stent over the sensor wire. We advanced a 6x26 JJ over the wire and down to the bladder.  The wire was then removed and good coil was noted in the renal pelvis under direct vision in the bladder under fluoroscopy.  We then placed a 18 French nephrostomy tube through the sheath into the renal pelvis.  The balloon was inflated with 3 amounts of contrast.  We then removed the access sheath in and obtain another nephrostogram.  We noted minimal extravasation of contrast.  We then secured the nephrostomy tubes with 0 silks in interrupted fashion.  Dressing was placed over the nephrostomy tube site and this then concluded the procedure was well-tolerated by the patient.  Complications: None  Condition: Stable, extubated, transferred to PACU  Plan: Patient is to be admitted overnight for observation.  Her Foley catheter through the morning.  She is then to be discharged home and followup in 4 days for nephrostomy tube removal

## 2022-08-07 NOTE — Anesthesia Procedure Notes (Signed)
Procedure Name: Intubation Date/Time: 08/07/2022 11:03 AM  Performed by: Orlie Dakin, CRNAPre-anesthesia Checklist: Patient identified, Emergency Drugs available, Suction available and Patient being monitored Patient Re-evaluated:Patient Re-evaluated prior to induction Oxygen Delivery Method: Circle system utilized Preoxygenation: Pre-oxygenation with 100% oxygen Induction Type: IV induction Ventilation: Mask ventilation without difficulty Laryngoscope Size: Glidescope and 3 Grade View: Grade I Tube type: Oral Tube size: 7.0 mm Number of attempts: 2 Airway Equipment and Method: Video-laryngoscopy and Stylet Placement Confirmation: ETT inserted through vocal cords under direct vision, positive ETCO2 and breath sounds checked- equal and bilateral Secured at: 21 cm Tube secured with: Tape Dental Injury: Teeth and Oropharynx as per pre-operative assessment  Comments: 1st DL Miller 3, grade 3 view, 2nd DL with Glidescope as noted above, Grade 1 view.  4x4s bite block used at end of proc.

## 2022-08-07 NOTE — Anesthesia Preprocedure Evaluation (Signed)
Anesthesia Evaluation  Patient identified by MRN, date of birth, ID band Patient awake    Reviewed: Allergy & Precautions, H&P , NPO status , Patient's Chart, lab work & pertinent test results, reviewed documented beta blocker date and time   Airway Mallampati: II  TM Distance: >3 FB Neck ROM: full    Dental no notable dental hx.    Pulmonary neg pulmonary ROS, former smoker   Pulmonary exam normal breath sounds clear to auscultation       Cardiovascular Exercise Tolerance: Good hypertension, negative cardio ROS  Rhythm:regular Rate:Normal     Neuro/Psych  PSYCHIATRIC DISORDERS Anxiety Depression Bipolar Disorder   negative neurological ROS  negative psych ROS   GI/Hepatic negative GI ROS, Neg liver ROS,,,  Endo/Other  negative endocrine ROSType 2Hypothyroidism    Renal/GU Renal diseasenegative Renal ROS  negative genitourinary   Musculoskeletal   Abdominal   Peds  Hematology negative hematology ROS (+)   Anesthesia Other Findings   Reproductive/Obstetrics negative OB ROS                             Anesthesia Physical Anesthesia Plan  ASA: 3  Anesthesia Plan: General   Post-op Pain Management:    Induction:   PONV Risk Score and Plan: Ondansetron  Airway Management Planned:   Additional Equipment:   Intra-op Plan:   Post-operative Plan:   Informed Consent: I have reviewed the patients History and Physical, chart, labs and discussed the procedure including the risks, benefits and alternatives for the proposed anesthesia with the patient or authorized representative who has indicated his/her understanding and acceptance.     Dental Advisory Given  Plan Discussed with: CRNA  Anesthesia Plan Comments:        Anesthesia Quick Evaluation

## 2022-08-07 NOTE — Progress Notes (Signed)
Called [name] at the request of the patient and notified him patient was in recovery and would be going to room 307 shortly.  He voiced understanding.

## 2022-08-07 NOTE — Plan of Care (Signed)
  Problem: Education: Goal: Knowledge of General Education information will improve Description Including pain rating scale, medication(s)/side effects and non-pharmacologic comfort measures Outcome: Progressing   Problem: Health Behavior/Discharge Planning: Goal: Ability to manage health-related needs will improve Outcome: Progressing   

## 2022-08-07 NOTE — H&P (Signed)
HPI: [name] is a 52yo here for followup for nephrolithiasis. She underwent ureteroscopy but due to the acute angle of the lower pole. She currently has a left nephroureteral stent in place.. She denies nay worsening LUTS. No other complaints today     PMH:     Past Medical History:  Diagnosis Date   Anxiety     Chronic kidney disease     CKD (chronic kidney disease) stage 3, GFR 30-59 ml/min (HCC) 03/15/2019   Depression     Diarrhea 05/04/2017   Essential hypertension, benign 03/15/2019   History of kidney stones     Hypertension     Hypothyroidism     Hypothyroidism, adult 03/15/2019   Kidney stones     LGSIL on Pap smear of cervix 08/17/2020    Needs colpo per [facility] immediate risk is 4.3 for CIN 3+risk    Obesity (BMI 30-39.9) 03/15/2019   Renal disorder     Vitamin D deficiency disease 03/15/2019      Surgical History:      Past Surgical History:  Procedure Laterality Date   BIOPSY   06/05/2017    Procedure: BIOPSY;  Surgeon: Rogene Houston, MD;  [facility]: [facility];  Service: Endoscopy;;  colon   CHOLECYSTECTOMY       COLONOSCOPY N/A 06/05/2017    Procedure: COLONOSCOPY;  Surgeon: Rogene Houston, MD;  Location: [facility];  Service: Endoscopy;  Laterality: N/A;  8:30   CYSTOSCOPY WITH RETROGRADE PYELOGRAM, URETEROSCOPY AND STENT PLACEMENT Left 10/04/2020    Procedure: CYSTOSCOPY WITH LEFT RETROGRADE PYELOGRAM, DIAGNOSTIC LEFT URETEROSCOPY AND LEFT URETEREAL STENT PLACEMENT;  Surgeon: Cleon Gustin, MD;  Location: [facility];  Service: Urology;  Laterality: Left;   CYSTOSCOPY WITH RETROGRADE PYELOGRAM, URETEROSCOPY AND STENT PLACEMENT Left 10/16/2020    Procedure: CYSTOSCOPY WITH LEFT RETROGRADE PYELOGRAM, LEFT URETEROSCOPY  WITH LASER AND LEFT URETERAL STENT EXCHANGE;  Surgeon: Cleon Gustin, MD;  Location: [facility];  Service: Urology;  Laterality: Left;   CYSTOSCOPY WITH RETROGRADE PYELOGRAM, URETEROSCOPY AND STENT PLACEMENT Bilateral 02/17/2022    Procedure:  CYSTOSCOPY WITH RETROGRADE PYELOGRAM, URETEROSCOPY AND STENT PLACEMENT;  Surgeon: Cleon Gustin, MD;  Location: [facility];  Service: Urology;  Laterality: Bilateral;   HOLMIUM LASER APPLICATION Left [id number]    Procedure: HOLMIUM LASER APPLICATION;  Surgeon: Cleon Gustin, MD;  Location: [facility];  Service: Urology;  Laterality: Left;   KIDNEY STONE SURGERY       kidney stones       LASER ABLATION CONDOLAMATA N/A 10/31/2020    Procedure: LASER ABLATION OF THE CERVIX;  Surgeon: Florian Buff, MD;  Location: [facility];  Service: Gynecology;  Laterality: N/A;      Home Medications:  Allergies as of 07/01/2022         Reactions    Morphine And Related Itching, Hives    Sulfa Antibiotics Swelling    Tape Itching, Other (See Comments)    Surgical             Medication List           Accurate as of July 01, 2022  1:50 PM. If you have any questions, ask your nurse or doctor.              alfuzosin 10 MG 24 hr tablet Commonly known as: Uroxatral Take 1 tablet (10 mg total) by mouth daily with breakfast.    ALPRAZolam 1 MG tablet Commonly known as: XANAX Take 1  mg by mouth 3 (three) times daily as needed for anxiety.    amLODipine 5 MG tablet Commonly known as: NORVASC Take 1 tablet (5 mg total) by mouth in the morning and at bedtime.    ARIPiprazole 5 MG tablet Commonly known as: ABILIFY Take 5 mg by mouth at bedtime.    atorvastatin 20 MG tablet Commonly known as: LIPITOR Take 1 tablet (20 mg total) by mouth daily.    carvedilol 25 MG tablet Commonly known as: COREG Take 25 mg by mouth 2 (two) times daily.    chlorthalidone 25 MG tablet Commonly known as: HYGROTON Take 12.5 mg by mouth daily.    doxepin 75 MG capsule Commonly known as: SINEQUAN Take 225 mg by mouth at bedtime.    Ingrezza 60 MG capsule Generic drug: valbenazine Take 60 mg by mouth at bedtime.    lamoTRIgine 200 MG tablet Commonly known as: LAMICTAL Take 200 mg by mouth 2 (two) times  daily.    losartan 25 MG tablet Commonly known as: COZAAR Take 25 mg by mouth in the morning.    medroxyPROGESTERone 150 MG/ML injection Commonly known as: DEPO-PROVERA Inject 1 mL (150 mg total) into the muscle every 3 (three) months.    nitrofurantoin (macrocrystal-monohydrate) 100 MG capsule Commonly known as: Macrobid Take 1 capsule (100 mg total) by mouth 2 (two) times daily for 7 days.    nitrofurantoin 50 MG capsule Commonly known as: MACRODANTIN Take 1 capsule (50 mg total) by mouth at bedtime.    ondansetron 4 MG tablet Commonly known as: Zofran Take 1 tablet (4 mg total) by mouth daily as needed for nausea or vomiting.    oxyCODONE-acetaminophen 5-325 MG tablet Commonly known as: Percocet Take 1 tablet by mouth every 4 (four) hours as needed.    potassium citrate 10 MEQ (1080 MG) SR tablet Commonly known as: UROCIT-K Take 20 mEq by mouth in the morning and at bedtime.    QUEtiapine 400 MG 24 hr tablet Commonly known as: SEROQUEL XR Take 400 mg by mouth at bedtime.    Vitamin D3 125 MCG (5000 UT) Tabs Take 10,000 Units by mouth daily.             Allergies:       Allergies  Allergen Reactions   Morphine And Related Itching and Hives   Sulfa Antibiotics Swelling   Tape Itching and Other (See Comments)      Surgical       Family History:      Family History  Problem Relation Age of Onset   Cancer Paternal Grandmother     Heart failure Paternal Grandmother     Cancer Maternal Grandmother     Cancer Father     Heart failure Father     Lung cancer Mother     Colon cancer Neg Hx        Social History:  reports that she quit smoking about 4 months ago. Her smoking use included cigarettes. She has a 10.00 pack-year smoking history. She has never used smokeless tobacco. She reports that she does not drink alcohol and does not use drugs.   ROS: All other review of systems were reviewed and are negative except what is noted above in HPI   Physical  Exam: BP 105/69   Pulse 96   LMP  (LMP Unknown)   Constitutional:  Alert and oriented, No acute distress. HEENT: [place] AT, moist mucus membranes.  Trachea midline, no masses. Cardiovascular: No clubbing, cyanosis,  or edema. Respiratory: Normal respiratory effort, no increased work of breathing. GI: Abdomen is soft, nontender, nondistended, no abdominal masses GU: No CVA tenderness.  Lymph: No cervical or inguinal lymphadenopathy. Skin: No rashes, bruises or suspicious lesions. Neurologic: Grossly intact, no focal deficits, moving all 4 extremities. Psychiatric: Normal mood and affect.   Laboratory Data: Recent Labs       Lab Results  Component Value Date    WBC 7.2 10/30/2020    HGB 12.6 10/30/2020    HCT 39.8 10/30/2020    MCV 89.4 10/30/2020    PLT 410 (H) 10/30/2020        Recent Labs       Lab Results  Component Value Date    CREATININE 1.25 (H) 06/25/2022        Recent Labs  No results found for: "PSA"     Recent Labs  No results found for: "TESTOSTERONE"     Recent Labs       Lab Results  Component Value Date    HGBA1C 5.2 09/04/2021        Urinalysis Labs (Brief)          Component Value Date/Time    COLORURINE YELLOW 10/30/2020 1355    APPEARANCEUR Cloudy (A) 06/03/2022 1431    LABSPEC 1.025 10/30/2020 1355    PHURINE 5.0 10/30/2020 1355    GLUCOSEU Negative 06/03/2022 1431    HGBUR NEGATIVE 10/30/2020 1355    BILIRUBINUR Negative 06/03/2022 1431    KETONESUR NEGATIVE 10/30/2020 1355    PROTEINUR 1+ (A) 06/03/2022 1431    PROTEINUR 30 (A) 10/30/2020 1355    UROBILINOGEN 0.2 01/17/2009 0124    NITRITE Positive (A) 06/03/2022 1431    NITRITE NEGATIVE 10/30/2020 1355    LEUKOCYTESUR 1+ (A) 06/03/2022 1431    LEUKOCYTESUR SMALL (A) 10/30/2020 1355        Recent Labs       Lab Results  Component Value Date    LABMICR See below: 06/03/2022    WBCUA 0-5 06/03/2022    LABEPIT 0-10 06/03/2022    MUCUS Present 09/23/2021    BACTERIA None  seen 06/03/2022        Pertinent Imaging:   Results for orders placed during the hospital encounter of 08/28/20   Abdomen 1 view (KUB)   Narrative CLINICAL DATA:  Nephrolithiasis   EXAM: ABDOMEN - 1 VIEW   COMPARISON:  CT 08/21/2020   FINDINGS: The bowel gas pattern is normal. 8 mm stone again seen projecting over the left renal shadow. No right sided renal calculi, which is partially obscured by overlying bowel gas and stool. Moderate volume stool throughout the colon. No acute osseous findings. Other significant radiographic abnormality are seen.   IMPRESSION: 8 mm stone projecting over the left renal shadow, similar to prior exam.     Electronically Signed By: Davina Poke D.O. On: 08/29/2020 10:41   No results found for this or any previous visit.   No results found for this or any previous visit.   No results found for this or any previous visit.   Results for orders placed during the hospital encounter of 05/21/22   Ultrasound renal complete   Narrative CLINICAL DATA:  History of stones.  Follow-up.   EXAM: RENAL / URINARY TRACT ULTRASOUND COMPLETE   COMPARISON:  August 30, 2021   FINDINGS: Right Kidney:   Renal measurements: 10 x 5 x 4 cm = volume: 106 mL. Echogenicity within normal limits. No mass or hydronephrosis  visualized.   Left Kidney:   Renal measurements: 7.6 x 4.1 x 3.9 cm = volume: 63 mL. Contains 11 mm nonobstructive stone in the lower pole.   Bladder:   Appears normal for degree of bladder distention.   Other:   The bladder is decompressed and not assessed on today's study.   IMPRESSION: 1. 11 mm nonobstructive stone in the lower pole of the left kidney. 2. No other abnormalities. The bladder was not assessed due to lack of distention.     Electronically Signed By: Dorise Bullion III M.D. On: 05/21/2022 16:40   No valid procedures specified. Results for orders placed during the hospital encounter of 09/09/21    CT HEMATURIA WORKUP   Narrative CLINICAL DATA:  Gross hematuria, chronic cystitis   EXAM: CT ABDOMEN AND PELVIS WITHOUT AND WITH CONTRAST   TECHNIQUE: Multidetector CT imaging of the abdomen and pelvis was performed following the standard protocol before and following the bolus administration of intravenous contrast.   RADIATION DOSE REDUCTION: This exam was performed according to the departmental dose-optimization program which includes automated exposure control, adjustment of the mA and/or kV according to patient size and/or use of iterative reconstruction technique.   CONTRAST:  24m OMNIPAQUE IOHEXOL 300 MG/ML  SOLN   COMPARISON:  12/17/2020   FINDINGS: Lower chest: No acute abnormality. Bandlike scarring of the bilateral lung bases.   Hepatobiliary: No focal liver abnormality is seen. Status post cholecystectomy. No biliary dilatation.   Pancreas: Unremarkable. No pancreatic ductal dilatation or surrounding inflammatory changes.   Spleen: Normal in size without significant abnormality.   Adrenals/Urinary Tract: Adrenal glands are unremarkable. Multiple small bilateral nonobstructive renal calculi. No ureteral calculi or hydronephrosis. Relatively atrophic, lobular appearance of the left kidney. No suspicious mass or contrast enhancement. No urinary tract filling defect on delayed phase imaging. Bladder is unremarkable.   Stomach/Bowel: Stomach is within normal limits. Appendix appears normal. No evidence of bowel wall thickening, distention, or inflammatory changes.   Vascular/Lymphatic: No significant vascular findings are present. No enlarged abdominal or pelvic lymph nodes.   Reproductive: No mass or other significant abnormality.   Other: No abdominal wall hernia or abnormality. No ascites.   Musculoskeletal: No acute or significant osseous findings.   IMPRESSION: 1. Multiple small bilateral nonobstructive renal calculi. No ureteral calculi or  hydronephrosis. 2. Relatively atrophic, lobular appearance of the left kidney, in keeping with prior obstructive, infectious, or ischemic insult. 3. No suspicious mass or contrast enhancement. No urinary tract filling defect on delayed phase imaging. 4. Normal bladder.     Electronically Signed By: ADelanna AhmadiM.D. On: 09/09/2021 10:55   Results for orders placed in visit on 08/21/20   CT RENAL STONE STUDY   Narrative CLINICAL DATA:  Left flank pain for 3 months. Hematuria. Nephrolithiasis.   EXAM: CT ABDOMEN AND PELVIS WITHOUT CONTRAST   TECHNIQUE: Multidetector CT imaging of the abdomen and pelvis was performed following the standard protocol without IV contrast.   COMPARISON:  03/21/2019   FINDINGS: Lower chest: No acute findings.   Hepatobiliary: No mass visualized on this unenhanced exam. Prior cholecystectomy. No evidence of biliary obstruction.   Pancreas: No mass or inflammatory process visualized on this unenhanced exam.   Spleen:  Within normal limits in size.   Adrenals/Urinary tract: Moderate diffuse left renal parenchymal atrophy and scarring is again seen. A few renal calculi are seen bilaterally, largest in the lower pole of the left kidney measuring 8 mm. No evidence of ureteral calculi  or hydronephrosis. Unremarkable unopacified urinary bladder.   Stomach/Bowel: No evidence of obstruction, inflammatory process, or abnormal fluid collections. Normal appendix visualized.   Vascular/Lymphatic: No pathologically enlarged lymph nodes identified. No evidence of abdominal aortic aneurysm.   Reproductive:  No mass or other significant abnormality.   Other:  None.   Musculoskeletal:  No suspicious bone lesions identified.   IMPRESSION: Bilateral nephrolithiasis. No evidence of ureteral calculi, hydronephrosis, or other acute findings.   Chronic diffuse left renal parenchymal atrophy and scarring.     Electronically Signed By: Marlaine Hind  M.D. On: 08/21/2020 16:45     Assessment & Plan:     1. Nephrolithiasis -We discussed the management of kidney stones. These options include observation, ureteroscopy, shockwave lithotripsy (ESWL) and percutaneous nephrolithotomy (PCNL). We discussed which options are relevant to the patient's stone(s). We discussed the natural history of kidney stones as well as the complications of untreated stones and the impact on quality of life without treatment as well as with each of the above listed treatments. We also discussed the efficacy of each treatment in its ability to clear the stone burden. With any of these management options I discussed the signs and symptoms of infection and the need for emergent treatment should these be experienced. For each option we discussed the ability of each procedure to clear the patient of their stone burden.   For observation I described the risks which include but are not limited to silent renal damage, life-threatening infection, need for emergent surgery, failure to pass stone and pain.   For ureteroscopy I described the risks which include bleeding, infection, damage to contiguous structures, positioning injury, ureteral stricture, ureteral avulsion, ureteral injury, need for prolonged ureteral stent, inability to perform ureteroscopy, need for an interval procedure, inability to clear stone burden, stent discomfort/pain, heart attack, stroke, pulmonary embolus and the inherent risks with general anesthesia.   For shockwave lithotripsy I described the risks which include arrhythmia, kidney contusion, kidney hemorrhage, need for transfusion, pain, inability to adequately break up stone, inability to pass stone fragments, Steinstrasse, infection associated with obstructing stones, need for alternate surgical procedure, need for repeat shockwave lithotripsy, MI, CVA, PE and the inherent risks with anesthesia/conscious sedation.   For PCNL I described the risks  including positioning injury, pneumothorax, hydrothorax, need for chest tube, inability to clear stone burden, renal laceration, arterial venous fistula or malformation, need for embolization of kidney, loss of kidney or renal function, need for repeat procedure, need for prolonged nephrostomy tube, ureteral avulsion, MI, CVA, PE and the inherent risks of general anesthesia.   - The patient would like to proceed with left PCNL - Urinalysis, Routine w reflex microscopic     No follow-ups on file.   Nicolette Bang, MD   [facility] [place]

## 2022-08-07 NOTE — Transfer of Care (Signed)
Immediate Anesthesia Transfer of Care Note  Patient: [name]  Procedure(s) Performed: NEPHROLITHOTOMY PERCUTANEOUS (Left: Renal)  Patient Location: PACU  Anesthesia Type:General  Level of Consciousness: drowsy  Airway & Oxygen Therapy: Patient Spontanous Breathing and Patient connected to face mask oxygen  Post-op Assessment: Report given to RN and Post -op Vital signs reviewed and stable  Post vital signs: Reviewed and stable  Last Vitals:  Vitals Value Taken Time  BP    Temp    Pulse 95 08/07/22 1233  Resp 13 08/07/22 1233  SpO2 100 % 08/07/22 1233  Vitals shown include unvalidated device data.  Last Pain:  Vitals:   08/07/22 0827  TempSrc: Oral  PainSc: 6       Patients Stated Pain Goal: 8 ([id number])  Complications: No notable events documented.

## 2022-08-07 NOTE — Telephone Encounter (Signed)
Patient currently in surgery.

## 2022-08-08 ENCOUNTER — Telehealth: Payer: Self-pay

## 2022-08-08 DIAGNOSIS — E039 Hypothyroidism, unspecified: Secondary | ICD-10-CM | POA: Diagnosis not present

## 2022-08-08 DIAGNOSIS — I129 Hypertensive chronic kidney disease with stage 1 through stage 4 chronic kidney disease, or unspecified chronic kidney disease: Secondary | ICD-10-CM | POA: Diagnosis not present

## 2022-08-08 DIAGNOSIS — N183 Chronic kidney disease, stage 3 unspecified: Secondary | ICD-10-CM | POA: Diagnosis not present

## 2022-08-08 DIAGNOSIS — N200 Calculus of kidney: Secondary | ICD-10-CM | POA: Diagnosis not present

## 2022-08-08 DIAGNOSIS — Z87891 Personal history of nicotine dependence: Secondary | ICD-10-CM | POA: Diagnosis not present

## 2022-08-08 DIAGNOSIS — Z79899 Other long term (current) drug therapy: Secondary | ICD-10-CM | POA: Diagnosis not present

## 2022-08-08 LAB — CBC
HCT: 35.6 % — ABNORMAL LOW (ref 36.0–46.0)
Hemoglobin: 12 g/dL (ref 12.0–15.0)
MCH: 30.3 pg (ref 26.0–34.0)
MCHC: 33.7 g/dL (ref 30.0–36.0)
MCV: 89.9 fL (ref 80.0–100.0)
Platelets: 266 10*3/uL (ref 150–400)
RBC: 3.96 MIL/uL (ref 3.87–5.11)
RDW: 13.2 % (ref 11.5–15.5)
WBC: 10.1 10*3/uL (ref 4.0–10.5)
nRBC: 0 % (ref 0.0–0.2)

## 2022-08-08 MED ORDER — OXYCODONE-ACETAMINOPHEN 10-325 MG PO TABS: 1.0000 | ORAL_TABLET | Freq: Four times a day (QID) | ORAL | 0 refills | Status: DC | PRN

## 2022-08-08 NOTE — Progress Notes (Addendum)
Patient slept through the night. Had to put patient back on 2L oxygen via nasal canula for patient was sating at 89-90%. Pain medication given once this shift. Foley output 1058m this shift. Continued to monitor.

## 2022-08-08 NOTE — Anesthesia Postprocedure Evaluation (Signed)
Anesthesia Post Note  Patient: [name]  Procedure(s) Performed: NEPHROLITHOTOMY PERCUTANEOUS (Left: Renal)  Patient location during evaluation: Phase II Anesthesia Type: General Level of consciousness: awake Pain management: pain level controlled Vital Signs Assessment: post-procedure vital signs reviewed and stable Respiratory status: spontaneous breathing and respiratory function stable Cardiovascular status: blood pressure returned to baseline and stable Postop Assessment: no headache and no apparent nausea or vomiting Anesthetic complications: no Comments: Late entry   No notable events documented.   Last Vitals:  Vitals:   08/08/22 0134 08/08/22 0517  BP: 114/65 (!) 106/56  Pulse: 98 93  Resp: 16 16  Temp: 36.7 C 36.6 C  SpO2: 95% 94%    Last Pain:  Vitals:   08/08/22 0517  TempSrc: Oral  PainSc:                  Louann Sjogren

## 2022-08-08 NOTE — TOC Progression Note (Signed)
Transition of Care [facility]) - Progression Note    Patient Details  Name: [name] MRN: [id number] Date of Birth: [birth year]  Transition [facility]) CM/SW Contact  Salome Arnt,  Phone Number: 08/08/2022, 8:42 AM  Clinical Narrative:   Transition of Care (TOC) Screening Note   Patient Details  Name: [name] Date of Birth: [birth year]   Transition of Care [facility]) CM/SW Contact:    Salome Arnt, [place] Phone Number: 08/08/2022, 8:42 AM    Transition of Care Department [facility]) has reviewed patient and no TOC needs have been identified at this time. We will continue to monitor patient advancement through interdisciplinary progression rounds. If new patient transition needs arise, please place a TOC consult.            Expected Discharge Plan and Services         Expected Discharge Date: 08/08/22                                     Social Determinants of Health (SDOH) Interventions SDOH Screenings   Food Insecurity: No Food Insecurity (08/07/2022)  Housing: Low Risk  (08/07/2022)  Transportation Needs: No Transportation Needs (08/07/2022)  Utilities: Not At Risk (08/07/2022)  Alcohol Screen: Low Risk  (08/06/2020)  Depression (PHQ2-9): High Risk (08/01/2022)  Financial Resource Strain: Low Risk  (07/21/2022)  Physical Activity: Insufficiently Active (08/06/2020)  Social Connections: Socially Isolated (07/21/2022)  Stress: No Stress Concern Present (07/21/2022)  Tobacco Use: Medium Risk (08/07/2022)    Readmission Risk Interventions     No data to display

## 2022-08-08 NOTE — Progress Notes (Signed)
Nsg Discharge Note  Admit Date:  08/07/2022 Discharge date: 08/08/2022   Paulyne D Mcclarty to be D/C'd Home per MD order.  AVS completed.  Copy for chart, and copy for patient signed, and dated. Patient/caregiver able to verbalize understanding.  Discharge Medication: Allergies as of 08/08/2022       Reactions   Morphine And Related Itching, Hives   Sulfa Antibiotics Swelling   Tape Itching, Other (See Comments)   Surgical         Medication List     STOP taking these medications    ondansetron 4 MG disintegrating tablet Commonly known as: ZOFRAN-ODT   oxyCODONE-acetaminophen 5-325 MG tablet Commonly known as: PERCOCET/ROXICET Replaced by: oxyCODONE-acetaminophen 10-325 MG tablet   traMADol 50 MG tablet Commonly known as: ULTRAM       TAKE these medications    alfuzosin 10 MG 24 hr tablet Commonly known as: Uroxatral Take 1 tablet (10 mg total) by mouth daily with breakfast.   ALPRAZolam 1 MG tablet Commonly known as: XANAX Take 1 mg by mouth 3 (three) times daily as needed for anxiety.   amLODipine 5 MG tablet Commonly known as: NORVASC Take 1 tablet (5 mg total) by mouth in the morning and at bedtime.   ARIPiprazole 5 MG tablet Commonly known as: ABILIFY Take 5 mg by mouth at bedtime.   atorvastatin 20 MG tablet Commonly known as: LIPITOR Take 1 tablet (20 mg total) by mouth daily.   carvedilol 25 MG tablet Commonly known as: COREG Take 25 mg by mouth 2 (two) times daily.   cephALEXin 500 MG capsule Commonly known as: KEFLEX Take 500 mg by mouth 3 (three) times daily.   chlorthalidone 25 MG tablet Commonly known as: HYGROTON Take 12.5 mg by mouth daily.   doxepin 75 MG capsule Commonly known as: SINEQUAN Take 225 mg by mouth at bedtime.   Ingrezza 60 MG capsule Generic drug: valbenazine Take 60 mg by mouth at bedtime.   lamoTRIgine 200 MG tablet Commonly known as: LAMICTAL Take 200 mg by mouth 2 (two) times daily.   losartan 25 MG  tablet Commonly known as: COZAAR Take 25 mg by mouth in the morning.   nitrofurantoin 50 MG capsule Commonly known as: MACRODANTIN Take 1 capsule (50 mg total) by mouth at bedtime.   ondansetron 4 MG tablet Commonly known as: Zofran Take 1 tablet (4 mg total) by mouth daily as needed for nausea or vomiting.   oxyCODONE-acetaminophen 10-325 MG tablet Commonly known as: Percocet Take 1 tablet by mouth every 6 (six) hours as needed for pain. Replaces: oxyCODONE-acetaminophen 5-325 MG tablet   potassium citrate 10 MEQ (1080 MG) SR tablet Commonly known as: UROCIT-K Take 20 mEq by mouth in the morning and at bedtime.   QUEtiapine 400 MG 24 hr tablet Commonly known as: SEROQUEL XR Take 400 mg by mouth at bedtime.   Vitamin D3 125 MCG (5000 UT) Tabs Generic drug: Cholecalciferol Take 10,000 Units by mouth daily.        Discharge Assessment: Vitals:   08/08/22 0134 08/08/22 0517  BP: 114/65 (!) 106/56  Pulse: 98 93  Resp: 16 16  Temp: 98.1 F (36.7 C) 97.9 F (36.6 C)  SpO2: 95% 94%   Skin clean, dry and intact without evidence of skin break down, no evidence of skin tears noted. IV catheter discontinued intact. Site without signs and symptoms of complications - no redness or edema noted at insertion site, patient denies c/o pain - only slight  tenderness at site.  Dressing with slight pressure applied.  D/c Instructions-Education: Discharge instructions given to patient/family with verbalized understanding. D/c education completed with patient/family including follow up instructions, medication list, d/c activities limitations if indicated, with other d/c instructions as indicated by MD - patient able to verbalize understanding, all questions fully answered. Patient instructed to return to ED, call 911, or call MD for any changes in condition.  Patient escorted via [place], and D/C home via private auto. Pt was able to void after foley was removed.   Clovis Fredrickson,  LPN [phone] AM

## 2022-08-08 NOTE — Telephone Encounter (Signed)
Patient states the nephrostomy bag is not draining like it was and she is seeing a lot of blood.  Verbal from Dr. Alyson Ingles that it is normal to see blood and she may have blood clots, the clots should resolve and the bleeding should clear over time.  Patient informed of MD response and will follow up as scheduled on 03/11.

## 2022-08-10 ENCOUNTER — Other Ambulatory Visit: Payer: Self-pay | Admitting: Urology

## 2022-08-11 ENCOUNTER — Telehealth: Payer: Self-pay

## 2022-08-11 ENCOUNTER — Encounter (HOSPITAL_COMMUNITY): Payer: Self-pay | Admitting: Emergency Medicine

## 2022-08-11 ENCOUNTER — Other Ambulatory Visit: Payer: Self-pay

## 2022-08-11 ENCOUNTER — Ambulatory Visit: Payer: Medicare HMO | Admitting: Urology

## 2022-08-11 ENCOUNTER — Emergency Department (HOSPITAL_COMMUNITY)
Admission: EM | Admit: 2022-08-11 | Discharge: 2022-08-12 | Disposition: A | Discharge status: Home / Self Care | Payer: Medicare HMO | Attending: Emergency Medicine | Admitting: Emergency Medicine

## 2022-08-11 ENCOUNTER — Emergency Department (HOSPITAL_COMMUNITY): Payer: Medicare HMO

## 2022-08-11 DIAGNOSIS — W182XXA Fall in (into) shower or empty bathtub, initial encounter: Secondary | ICD-10-CM | POA: Diagnosis not present

## 2022-08-11 DIAGNOSIS — Y92002 Bathroom of unspecified non-institutional (private) residence single-family (private) house as the place of occurrence of the external cause: Secondary | ICD-10-CM | POA: Diagnosis not present

## 2022-08-11 DIAGNOSIS — N200 Calculus of kidney: Secondary | ICD-10-CM

## 2022-08-11 DIAGNOSIS — Z9189 Other specified personal risk factors, not elsewhere classified: Secondary | ICD-10-CM | POA: Diagnosis not present

## 2022-08-11 DIAGNOSIS — E876 Hypokalemia: Secondary | ICD-10-CM

## 2022-08-11 DIAGNOSIS — F319 Bipolar disorder, unspecified: Secondary | ICD-10-CM | POA: Diagnosis not present

## 2022-08-11 DIAGNOSIS — R930 Abnormal findings on diagnostic imaging of skull and head, not elsewhere classified: Secondary | ICD-10-CM | POA: Insufficient documentation

## 2022-08-11 DIAGNOSIS — Z79899 Other long term (current) drug therapy: Secondary | ICD-10-CM | POA: Insufficient documentation

## 2022-08-11 DIAGNOSIS — R410 Disorientation, unspecified: Secondary | ICD-10-CM | POA: Diagnosis not present

## 2022-08-11 DIAGNOSIS — Z043 Encounter for examination and observation following other accident: Secondary | ICD-10-CM | POA: Diagnosis not present

## 2022-08-11 DIAGNOSIS — W19XXXA Unspecified fall, initial encounter: Secondary | ICD-10-CM

## 2022-08-11 LAB — CBC
HCT: 38.9 % (ref 36.0–46.0)
Hemoglobin: 12.8 g/dL (ref 12.0–15.0)
MCH: 30.2 pg (ref 26.0–34.0)
MCHC: 32.9 g/dL (ref 30.0–36.0)
MCV: 91.7 fL (ref 80.0–100.0)
Platelets: 324 10*3/uL (ref 150–400)
RBC: 4.24 MIL/uL (ref 3.87–5.11)
RDW: 13.5 % (ref 11.5–15.5)
WBC: 8.8 10*3/uL (ref 4.0–10.5)
nRBC: 0 % (ref 0.0–0.2)

## 2022-08-11 LAB — COMPREHENSIVE METABOLIC PANEL
ALT: 23 U/L (ref 0–44)
AST: 28 U/L (ref 15–41)
Albumin: 3.6 g/dL (ref 3.5–5.0)
Alkaline Phosphatase: 146 U/L — ABNORMAL HIGH (ref 38–126)
Anion gap: 12 (ref 5–15)
BUN: 16 mg/dL (ref 6–20)
CO2: 26 mmol/L (ref 22–32)
Calcium: 9.1 mg/dL (ref 8.9–10.3)
Chloride: 101 mmol/L (ref 98–111)
Creatinine, Ser: 1.46 mg/dL — ABNORMAL HIGH (ref 0.44–1.00)
GFR, Estimated: 43 mL/min — ABNORMAL LOW (ref >60)
Glucose, Bld: 82 mg/dL (ref 70–99)
Potassium: 3.3 mmol/L — ABNORMAL LOW (ref 3.5–5.1)
Sodium: 139 mmol/L (ref 135–145)
Total Bilirubin: 0.6 mg/dL (ref 0.3–1.2)
Total Protein: 7.1 g/dL (ref 6.5–8.1)

## 2022-08-11 LAB — URINALYSIS, ROUTINE W REFLEX MICROSCOPIC
Bilirubin Urine: NEGATIVE
Glucose, UA: NEGATIVE mg/dL
Ketones, ur: NEGATIVE mg/dL
Nitrite: NEGATIVE
Protein, ur: 100 mg/dL — AB
RBC / HPF: >50 RBC/hpf (ref 0–5)
Specific Gravity, Urine: 1.015 (ref 1.005–1.030)
pH: 6 (ref 5.0–8.0)

## 2022-08-11 LAB — CK: Total CK: 858 U/L — ABNORMAL HIGH (ref 38–234)

## 2022-08-11 LAB — ACETAMINOPHEN LEVEL: Acetaminophen (Tylenol), Serum: <10 ug/mL — ABNORMAL LOW (ref 10–30)

## 2022-08-11 LAB — SALICYLATE LEVEL: Salicylate Lvl: <7 mg/dL — ABNORMAL LOW (ref 7.0–30.0)

## 2022-08-11 LAB — LACTIC ACID, PLASMA: Lactic Acid, Venous: 1.3 mmol/L (ref 0.5–1.9)

## 2022-08-11 LAB — CBG MONITORING, ED: Glucose-Capillary: 91 mg/dL (ref 70–99)

## 2022-08-11 MED ORDER — ATORVASTATIN CALCIUM 10 MG PO TABS
20.0000 mg | ORAL_TABLET | Freq: Every day | ORAL | Status: DC
  Administered 2022-08-12: 20 mg via ORAL
  Filled 2022-08-11: qty 2

## 2022-08-11 MED ORDER — LOSARTAN POTASSIUM 25 MG PO TABS
25.0000 mg | ORAL_TABLET | Freq: Every morning | ORAL | Status: DC
  Administered 2022-08-12: 25 mg via ORAL
  Filled 2022-08-11: qty 1

## 2022-08-11 MED ORDER — LAMOTRIGINE 25 MG PO TABS
200.0000 mg | ORAL_TABLET | Freq: Two times a day (BID) | ORAL | Status: DC
  Administered 2022-08-12: 200 mg via ORAL
  Filled 2022-08-11: qty 8

## 2022-08-11 MED ORDER — ALPRAZOLAM 0.5 MG PO TABS: 1.0000 mg | ORAL_TABLET | Freq: Three times a day (TID) | ORAL | Status: DC | PRN

## 2022-08-11 MED ORDER — ONDANSETRON HCL 4 MG PO TABS: 4.0000 mg | ORAL_TABLET | Freq: Every day | ORAL | Status: DC | PRN

## 2022-08-11 MED ORDER — CARVEDILOL 12.5 MG PO TABS
25.0000 mg | ORAL_TABLET | Freq: Two times a day (BID) | ORAL | Status: DC
  Administered 2022-08-12 (×2): 25 mg via ORAL
  Filled 2022-08-11 (×2): qty 2

## 2022-08-11 MED ORDER — QUETIAPINE FUMARATE ER 400 MG PO TB24: 400.0000 mg | ORAL_TABLET | Freq: Every day | ORAL | Status: DC

## 2022-08-11 MED ORDER — CHLORTHALIDONE 25 MG PO TABS
12.5000 mg | ORAL_TABLET | Freq: Every day | ORAL | Status: DC
  Administered 2022-08-12: 12.5 mg via ORAL
  Filled 2022-08-11: qty 1

## 2022-08-11 MED ORDER — NITROFURANTOIN MACROCRYSTAL 50 MG PO CAPS: 50.0000 mg | ORAL_CAPSULE | Freq: Every day | ORAL | Status: DC

## 2022-08-11 MED ORDER — ALFUZOSIN HCL ER 10 MG PO TB24
10.0000 mg | ORAL_TABLET | Freq: Every day | ORAL | Status: DC
  Administered 2022-08-12: 10 mg via ORAL
  Filled 2022-08-11: qty 1

## 2022-08-11 MED ORDER — POTASSIUM CITRATE ER 10 MEQ (1080 MG) PO TBCR: 20.0000 meq | EXTENDED_RELEASE_TABLET | Freq: Every day | ORAL | Status: DC

## 2022-08-11 MED ORDER — VALBENAZINE TOSYLATE 60 MG PO CAPS: 60.0000 mg | ORAL_CAPSULE | Freq: Every day | ORAL | Status: DC

## 2022-08-11 MED ORDER — DOXEPIN HCL 75 MG PO CAPS
225.0000 mg | ORAL_CAPSULE | Freq: Every day | ORAL | Status: DC
  Filled 2022-08-11 (×2): qty 3

## 2022-08-11 MED ORDER — CEPHALEXIN 500 MG PO CAPS
500.0000 mg | ORAL_CAPSULE | Freq: Three times a day (TID) | ORAL | Status: DC
  Administered 2022-08-12 (×2): 500 mg via ORAL
  Filled 2022-08-11 (×2): qty 1

## 2022-08-11 MED ORDER — ARIPIPRAZOLE 5 MG PO TABS: 5.0000 mg | ORAL_TABLET | Freq: Every day | ORAL | Status: DC

## 2022-08-11 MED ORDER — AMLODIPINE BESYLATE 5 MG PO TABS
5.0000 mg | ORAL_TABLET | Freq: Every day | ORAL | Status: DC
  Administered 2022-08-12: 5 mg via ORAL
  Filled 2022-08-11: qty 1

## 2022-08-11 MED ORDER — CHOLECALCIFEROL 125 MCG (5000 UT) PO TABS: 10000.0000 [IU] | ORAL_TABLET | Freq: Every day | ORAL | Status: DC
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
## 2022-08-11 NOTE — ED Notes (Signed)
Pt's clothing removed and placed in a bag and given to pt's aunt at bedside. Pt has been IVC'd, is awaiting medical clearance and TTS. Pt aware of IVC status. Pt placed into purple scrubs, sitter at bedside.

## 2022-08-11 NOTE — ED Notes (Signed)
Pt speaking w/TTS via video monitor.

## 2022-08-11 NOTE — Telephone Encounter (Signed)
Spoke with patient father Elenore Rota about patient is over medicated and she was laying out in the floor. Family member called EMS for the patient and patient refused to go to the hospital. Family member mention that they went to the court office to get patient escort to [facility]. Family member believe that patient is over medicating herself and a danger to herself. Family wants patient's IVC. Family member is aware that MD will be made aware of the situation. Family member voiced understanding.

## 2022-08-11 NOTE — ED Triage Notes (Signed)
Family reports pt has tube placed last Tuesday & kidney surgery last Thursday.  States pt's post op site smells & looks infected.  Family states pt fell this morning when she was bending over feeding the cats.  Family thinks her medications aren't correct & that the pt is acting more confused than usual. Pt denies any pain at time of triage.

## 2022-08-11 NOTE — ED Provider Notes (Cosign Needed Addendum)
[facility] Provider Note   CSN: [id number] Arrival date & time: 08/11/22  1459     History  Chief Complaint  Patient presents with   Fall   Post-op Problem   HPI [name] is a 52 y.o. female s/p nephrolithotomy on March 7 with residing post surgical drain presenting for recent fall and concern for infection around the drain insertion site.  Patient states she fell this morning but cannot remember what happened.  States she has had a recent history of multiple falls and is currently being worked up by neurology.  Her family member mentioned that they found her lying on the bathroom floor and were concerned she had been there for hours.  Patient unsure if she hit her head or lost consciousness.  Her family also is concerned that she is taking medications incorrectly and has seemed overall more confused.  Patient also mentioned that a family member looked at their her drainage site and was concerned that it might be infected.  Patient denies fever, chills nausea and vomiting.  Denies urinary changes.    Fall       Home Medications Prior to Admission medications   Medication Sig Start Date End Date Taking? Authorizing Provider  alfuzosin (UROXATRAL) 10 MG 24 hr tablet Take 1 tablet (10 mg total) by mouth daily with breakfast. 06/26/22   McKenzie, Candee Furbish, MD  ALPRAZolam Duanne Moron) 1 MG tablet Take 1 mg by mouth 3 (three) times daily as needed for anxiety.     [provider]  amLODipine (NORVASC) 5 MG tablet Take 1 tablet (5 mg total) by mouth in the morning and at bedtime. 12/04/20   Ailene Ards, NP  ARIPiprazole (ABILIFY) 5 MG tablet Take 5 mg by mouth at bedtime.    [provider]  atorvastatin (LIPITOR) 20 MG tablet Take 1 tablet (20 mg total) by mouth daily. 04/04/22   Lindell Spar, MD  carvedilol (COREG) 25 MG tablet Take 25 mg by mouth 2 (two) times daily. 11/08/20   [provider]  cephALEXin (KEFLEX)  500 MG capsule Take 500 mg by mouth 3 (three) times daily. 07/31/22   [provider]  chlorthalidone (HYGROTON) 25 MG tablet Take 12.5 mg by mouth daily. 08/13/21   [provider]  Cholecalciferol (VITAMIN D3) 125 MCG (5000 UT) TABS Take 10,000 Units by mouth daily.    [provider]  doxepin (SINEQUAN) 75 MG capsule Take 225 mg by mouth at bedtime.     [provider]  INGREZZA 60 MG capsule Take 60 mg by mouth at bedtime. 04/17/22   [provider]  lamoTRIgine (LAMICTAL) 200 MG tablet Take 200 mg by mouth 2 (two) times daily.    [provider]  losartan (COZAAR) 25 MG tablet Take 25 mg by mouth in the morning.    [provider]  nitrofurantoin (MACRODANTIN) 50 MG capsule Take 1 capsule (50 mg total) by mouth at bedtime. 09/23/21   McKenzie, Candee Furbish, MD  ondansetron (ZOFRAN) 4 MG tablet Take 1 tablet (4 mg total) by mouth daily as needed for nausea or vomiting. 06/26/22 06/26/23  McKenzie, Candee Furbish, MD  oxyCODONE-acetaminophen (PERCOCET) 10-325 MG tablet Take 1 tablet by mouth every 6 (six) hours as needed for pain. 08/08/22 08/08/23  Cleon Gustin, MD  potassium citrate (UROCIT-K) 10 MEQ (1080 MG) SR tablet Take 20 mEq by mouth in the morning and at bedtime. 08/16/20  [provider]  QUEtiapine (SEROQUEL XR) 400 MG 24 hr tablet Take 400 mg by mouth at bedtime.     [provider]      Allergies    Morphine and related, Sulfa antibiotics, and Tape    Review of Systems   Review of Systems  Psychiatric/Behavioral:  Positive for confusion.     Physical Exam   Vitals:   08/11/22 2100 08/11/22 2130  BP: 129/66 135/64  Pulse: 93 88  Resp: 18 13  Temp:    SpO2: 93% 91%    CONSTITUTIONAL:  well-appearing, NAD NEURO:  GCS 15. Speech is goal oriented. No deficits appreciated to CN III-XII; symmetric eyebrow raise, no facial drooping, tongue midline. Patient has equal grip strength bilaterally with 5/5  strength against resistance in all major muscle groups bilaterally. Sensation to light touch intact. Patient moves extremities without ataxia. Normal finger-nose-finger. Patient declined gait assessment  EYES:  eyes equal and reactive ENT/NECK:  Supple, no stridor  CARDIO:  tachycardic ,regular rhythm, appears well-perfused  PULM:  No respiratory distress, CTAB GI/GU:  non-distended, soft, non tender MSK/SPINE:  No gross deformities, no edema, moves all extremities  SKIN: Drainage insertion site located in the mid left back, no observable oozing, bleeding, mild induration but no erythema or swelling.  Not hot to touch.   *Additional and/or pertinent findings included in MDM below    ED Results / Procedures / Treatments   Labs (all labs ordered are listed, but only abnormal results are displayed) Labs Reviewed  COMPREHENSIVE METABOLIC PANEL - Abnormal; Notable for the following components:      Result Value   Potassium 3.3 (*)    Creatinine, Ser 1.46 (*)    Alkaline Phosphatase 146 (*)    GFR, Estimated 43 (*)    All other components within normal limits  URINALYSIS, ROUTINE W REFLEX MICROSCOPIC - Abnormal; Notable for the following components:   Hgb urine dipstick MODERATE (*)    Protein, ur 100 (*)    Leukocytes,Ua TRACE (*)    Bacteria, UA RARE (*)    All other components within normal limits  CK - Abnormal; Notable for the following components:   Total CK 858 (*)    All other components within normal limits  SALICYLATE LEVEL - Abnormal; Notable for the following components:   Salicylate Lvl [id number] (*)    All other components within normal limits  ACETAMINOPHEN LEVEL - Abnormal; Notable for the following components:   Acetaminophen (Tylenol), Serum <10 (*)    All other components within normal limits  CBC  LACTIC ACID, PLASMA  CBG MONITORING, ED  POC URINE PREG, ED    EKG None  Radiology DG Chest 1 View  Result Date: 08/11/2022 CLINICAL DATA:  Confusion, fell this  morning EXAM: CHEST  1 VIEW COMPARISON:  10/24/2020 FINDINGS: Single frontal view of the chest demonstrates an unremarkable cardiac silhouette. The increased density at the right lung base could reflect lung consolidation or small right pleural effusion. Minimal scarring within the lingula unchanged. No pneumothorax. No acute bony abnormalities. IMPRESSION: 1. Veiling opacity right lung base consistent with right basilar consolidation and/or small effusion. Electronically Signed   By: Randa Ngo M.D.   On: 08/11/2022 19:11   CT Head Wo Contrast  Result Date: 08/11/2022 CLINICAL DATA:  Fall EXAM: CT HEAD WITHOUT CONTRAST CT CERVICAL SPINE WITHOUT CONTRAST TECHNIQUE: Multidetector CT imaging of the head and cervical spine was performed following the standard protocol without intravenous contrast. Multiplanar CT  image reconstructions of the cervical spine were also generated. RADIATION DOSE REDUCTION: This exam was performed according to the departmental dose-optimization program which includes automated exposure control, adjustment of the mA and/or kV according to patient size and/or use of iterative reconstruction technique. COMPARISON:  07/13/2003 CT head; correlation is made with MRI head and cervical spine 06/09/2022 FINDINGS: CT HEAD FINDINGS Brain: No evidence of acute infarct, hemorrhage, mass, mass effect, or midline shift. No hydrocephalus or extra-axial fluid collection. Vascular: No hyperdense vessel. Skull: Negative for fracture or focal lesion. Hyperostosis frontal temporalis Sinuses/Orbits: No acute finding. Other: The mastoid air cells are well aerated. CT CERVICAL SPINE FINDINGS Alignment: No listhesis. Skull base and vertebrae: No acute fracture. No primary bone lesion or focal pathologic process. Soft tissues and spinal canal: No prevertebral fluid or swelling. No visible canal hematoma. Disc levels: Degenerative changes in the cervical spine. No significant spinal canal stenosis. Upper  chest: Negative. IMPRESSION: 1. No acute intracranial process. 2. No acute fracture or traumatic listhesis of the cervical spine. Electronically Signed   By: Merilyn Baba M.D.   On: 08/11/2022 19:05   CT Cervical Spine Wo Contrast  Result Date: 08/11/2022 CLINICAL DATA:  Fall EXAM: CT HEAD WITHOUT CONTRAST CT CERVICAL SPINE WITHOUT CONTRAST TECHNIQUE: Multidetector CT imaging of the head and cervical spine was performed following the standard protocol without intravenous contrast. Multiplanar CT image reconstructions of the cervical spine were also generated. RADIATION DOSE REDUCTION: This exam was performed according to the departmental dose-optimization program which includes automated exposure control, adjustment of the mA and/or kV according to patient size and/or use of iterative reconstruction technique. COMPARISON:  07/13/2003 CT head; correlation is made with MRI head and cervical spine 06/09/2022 FINDINGS: CT HEAD FINDINGS Brain: No evidence of acute infarct, hemorrhage, mass, mass effect, or midline shift. No hydrocephalus or extra-axial fluid collection. Vascular: No hyperdense vessel. Skull: Negative for fracture or focal lesion. Hyperostosis frontal temporalis Sinuses/Orbits: No acute finding. Other: The mastoid air cells are well aerated. CT CERVICAL SPINE FINDINGS Alignment: No listhesis. Skull base and vertebrae: No acute fracture. No primary bone lesion or focal pathologic process. Soft tissues and spinal canal: No prevertebral fluid or swelling. No visible canal hematoma. Disc levels: Degenerative changes in the cervical spine. No significant spinal canal stenosis. Upper chest: Negative. IMPRESSION: 1. No acute intracranial process. 2. No acute fracture or traumatic listhesis of the cervical spine. Electronically Signed   By: Merilyn Baba M.D.   On: 08/11/2022 19:05    Procedures Procedures    Medications Ordered in ED Medications - No data to display  ED Course/ Medical Decision  Making/ A&P                             Medical Decision Making Amount and/or Complexity of Data Reviewed Labs: ordered. Radiology: ordered.  Risk OTC drugs. Prescription drug management.   52 yo female who is well-appearing status post recent nephrolithotomy March 7 presenting for concern for infection around the postsurgical drainage site and recent fall.  I inspected the drainage site and there was no clinical concern for infection and it appeared that drainage tube was in good position.  History of fall was largely unclear, felt it warranted further workup with CT scanning of the head and neck which were both unremarkable.  Patient was somewhat sluggish but overall mentating appropriately.  Labs were also reassuring.  I was informed by the charge nurse that during  her encounter, her mother filed for IVC concerned that she was noncompliant with her medications and I hazard to herself.  Patient was medically cleared, placed psych hold and now awaiting evaluation by psychiatry.  TTS consult informed that they would prefer that patient stay in the ED tonight and be evaluated in the morning.  I made Dr. Shanon Brow clinical aware of this patient and advised him that she is medically cleared just waiting TTS evaluation. Ordered her home meds.         Final Clinical Impression(s) / ED Diagnoses Final diagnoses:  Fall, initial encounter    Rx / DC Orders ED Discharge Orders     None         Harriet Pho, PA-C 08/11/22 2343    Harriet Pho, PA-C 08/11/22 2346    Hayden Rasmussen, MD 08/12/22 1037

## 2022-08-12 ENCOUNTER — Other Ambulatory Visit: Payer: Self-pay

## 2022-08-12 MED ORDER — POTASSIUM CHLORIDE CRYS ER 20 MEQ PO TBCR
40.0000 meq | EXTENDED_RELEASE_TABLET | Freq: Once | ORAL | Status: AC
  Administered 2022-08-12: 40 meq via ORAL
  Filled 2022-08-12: qty 2

## 2022-08-12 MED ORDER — DOXEPIN HCL 50 MG PO CAPS
225.0000 mg | ORAL_CAPSULE | Freq: Every day | ORAL | Status: DC
  Filled 2022-08-12: qty 4

## 2022-08-12 MED ORDER — VITAMIN D 25 MCG (1000 UNIT) PO TABS
1000.0000 [IU] | ORAL_TABLET | Freq: Every day | ORAL | Status: DC
  Administered 2022-08-12: 1000 [IU] via ORAL
  Filled 2022-08-12: qty 1

## 2022-08-12 NOTE — ED Provider Notes (Signed)
Emergency Medicine Observation Re-evaluation Note  [name] is a 52 y.o. female, seen on rounds today.  Pt initially presented to the ED for complaints of BH eval, family concerned about possible overuse of meds.  Pt denies any c/o currently. States she feels fine and ready for d/c.   Physical Exam  BP 135/64   Pulse 88   Temp 98.6 F (37 C) (Oral)   Resp 13   Ht 1.575 m ('5\' 2"'$ )   Wt 88.9 kg   SpO2 91%   BMI 35.85 kg/m  Physical Exam General: alert, content.  Cardiac: regular rate.  Lungs: breathing comfortably. Psych: normal mood and affect. Pt does not appear acutely depressed or despondent. Pt denies any thoughts or plan of self harm. Pt does not appear to be responding to internal stimuli - no delusions or hallucinations noted. No acute psychosis.   ED Course / MDM   I have reviewed the labs performed to date as well as medications administered while in observation.  Recent changes in the last 24 hours include ED obs, reassessment.   Plan  Chart reviewed - rare/remote episodes of suspicion of med seeking behavior noted. I did discuss pts meds/multiple meds with sedating side effects, and need to minimize use of sedating meds, and never to take in over the prescribed amount.  Pt is alert, oriented, thought processes appear clear. She verbalized her understanding of meds, sedating effects, and importance of not taking in excess and/or combining meds. She indicates she has no intent to take extra meds. Denies any depression. States she plans to f/u closely with her urologist and has f/u appt with her psychiatrist this Friday, and that she will take all meds there, discuss meds/doses.   Currently no indicating for ivc.  Pt does not wish to stay in ED/hospital and feels ready for d/c.   Rec close outpatient pcp, urology and bh follow up.   Return precautions provided.       Lajean Saver, MD 08/12/22 [phone]

## 2022-08-12 NOTE — Discharge Summary (Signed)
Physician Discharge Summary  Patient ID: [name] MRN: [id number] DOB/AGE: December 04, 1970 52 y.o.  Admit date: 08/07/2022 Discharge date: 08/08/2022  Admission Diagnoses:  Renal calculus  Discharge Diagnoses:  Principal Problem:   Renal calculus   Past Medical History:  Diagnosis Date   Anxiety    Chronic kidney disease    CKD (chronic kidney disease) stage 3, GFR 30-59 ml/min ([place]) 03/15/2019   Depression    Diarrhea 05/04/2017   Essential hypertension, benign 03/15/2019   History of kidney stones    Hypertension    Hypothyroidism    Hypothyroidism, adult 03/15/2019   Kidney stones    LGSIL on Pap smear of cervix 08/17/2020   Needs colpo per [facility] immediate risk is 4.3 for CIN 3+risk    Obesity (BMI 30-39.9) 03/15/2019   Renal disorder    Vitamin D deficiency disease 03/15/2019    Surgeries: Procedure(s): NEPHROLITHOTOMY PERCUTANEOUS on 08/07/2022   Consultants (if any):   Discharged Condition: Improved  Hospital Course: [name] is an 52 y.o. female who was admitted 08/07/2022 with a diagnosis of Renal calculus and went to the operating room on 08/07/2022 and underwent the above named procedures.    She was given perioperative antibiotics:  Anti-infectives (From admission, onward)    Start     Dose/Rate Route Frequency Ordered Stop   08/07/22 0802  ceFAZolin (ANCEF) 2-4 GM/100ML-% IVPB       Note to Pharmacy: Augustin Schooling R: cabinet override      08/07/22 0802 08/07/22 1107   08/07/22 0800  ceFAZolin (ANCEF) IVPB 2g/100 mL premix        2 g 200 mL/hr over 30 Minutes Intravenous 30 min pre-op 08/07/22 0800 08/07/22 1119     .  She was given sequential compression devices, early ambulation for DVT prophylaxis.  She benefited maximally from the hospital stay and there were no complications.    Recent vital signs:  Vitals:   08/08/22 0134 08/08/22 0517  BP: 114/65 (!) 106/56  Pulse: 98 93  Resp: 16 16  Temp: 98.1 F (36.7 C) 97.9 F (36.6 C)   SpO2: 95% 94%    Recent laboratory studies:  Lab Results  Component Value Date   HGB 12.8 08/11/2022   HGB 12.0 08/08/2022   HGB 13.7 08/07/2022   Lab Results  Component Value Date   WBC 8.8 08/11/2022   PLT 324 08/11/2022   Lab Results  Component Value Date   INR 1.1 08/05/2022   Lab Results  Component Value Date   NA 139 08/11/2022   K 3.3 (L) 08/11/2022   CL 101 08/11/2022   CO2 26 08/11/2022   BUN 16 08/11/2022   CREATININE 1.46 (H) 08/11/2022   GLUCOSE 82 08/11/2022    Discharge Medications:   Allergies as of 08/08/2022       Reactions   Morphine And Related Itching, Hives   Sulfa Antibiotics Swelling   Tape Itching, Other (See Comments)   Surgical         Medication List     STOP taking these medications    ondansetron 4 MG disintegrating tablet Commonly known as: ZOFRAN-ODT   oxyCODONE-acetaminophen 5-325 MG tablet Commonly known as: PERCOCET/ROXICET Replaced by: oxyCODONE-acetaminophen 10-325 MG tablet   traMADol 50 MG tablet Commonly known as: ULTRAM       TAKE these medications    alfuzosin 10 MG 24 hr tablet Commonly known as: Uroxatral Take 1 tablet (10 mg total) by mouth daily with breakfast.  ALPRAZolam 1 MG tablet Commonly known as: XANAX Take 1 mg by mouth 3 (three) times daily as needed for anxiety.   amLODipine 5 MG tablet Commonly known as: NORVASC Take 1 tablet (5 mg total) by mouth in the morning and at bedtime.   ARIPiprazole 5 MG tablet Commonly known as: ABILIFY Take 5 mg by mouth at bedtime.   atorvastatin 20 MG tablet Commonly known as: LIPITOR Take 1 tablet (20 mg total) by mouth daily.   carvedilol 25 MG tablet Commonly known as: COREG Take 25 mg by mouth 2 (two) times daily.   cephALEXin 500 MG capsule Commonly known as: KEFLEX Take 500 mg by mouth 3 (three) times daily.   chlorthalidone 25 MG tablet Commonly known as: HYGROTON Take 12.5 mg by mouth daily.   doxepin 75 MG capsule Commonly  known as: SINEQUAN Take 225 mg by mouth at bedtime.   Ingrezza 60 MG capsule Generic drug: valbenazine Take 60 mg by mouth at bedtime.   lamoTRIgine 200 MG tablet Commonly known as: LAMICTAL Take 200 mg by mouth 2 (two) times daily.   losartan 25 MG tablet Commonly known as: COZAAR Take 25 mg by mouth in the morning.   nitrofurantoin 50 MG capsule Commonly known as: MACRODANTIN Take 1 capsule (50 mg total) by mouth at bedtime.   ondansetron 4 MG tablet Commonly known as: Zofran Take 1 tablet (4 mg total) by mouth daily as needed for nausea or vomiting.   oxyCODONE-acetaminophen 10-325 MG tablet Commonly known as: Percocet Take 1 tablet by mouth every 6 (six) hours as needed for pain. Replaces: oxyCODONE-acetaminophen 5-325 MG tablet   potassium citrate 10 MEQ (1080 MG) SR tablet Commonly known as: UROCIT-K Take 20 mEq by mouth in the morning and at bedtime.   QUEtiapine 400 MG 24 hr tablet Commonly known as: SEROQUEL XR Take 400 mg by mouth at bedtime.   Vitamin D3 125 MCG (5000 UT) Tabs Generic drug: Cholecalciferol Take 10,000 Units by mouth daily.        Diagnostic Studies: DG Chest 1 View  Result Date: 08/11/2022 CLINICAL DATA:  Confusion, fell this morning EXAM: CHEST  1 VIEW COMPARISON:  10/24/2020 FINDINGS: Single frontal view of the chest demonstrates an unremarkable cardiac silhouette. The increased density at the right lung base could reflect lung consolidation or small right pleural effusion. Minimal scarring within the lingula unchanged. No pneumothorax. No acute bony abnormalities. IMPRESSION: 1. Veiling opacity right lung base consistent with right basilar consolidation and/or small effusion. Electronically Signed   By: Randa Ngo M.D.   On: 08/11/2022 19:11   CT Head Wo Contrast  Result Date: 08/11/2022 CLINICAL DATA:  Fall EXAM: CT HEAD WITHOUT CONTRAST CT CERVICAL SPINE WITHOUT CONTRAST TECHNIQUE: Multidetector CT imaging of the head and cervical  spine was performed following the standard protocol without intravenous contrast. Multiplanar CT image reconstructions of the cervical spine were also generated. RADIATION DOSE REDUCTION: This exam was performed according to the departmental dose-optimization program which includes automated exposure control, adjustment of the mA and/or kV according to patient size and/or use of iterative reconstruction technique. COMPARISON:  07/13/2003 CT head; correlation is made with MRI head and cervical spine 06/09/2022 FINDINGS: CT HEAD FINDINGS Brain: No evidence of acute infarct, hemorrhage, mass, mass effect, or midline shift. No hydrocephalus or extra-axial fluid collection. Vascular: No hyperdense vessel. Skull: Negative for fracture or focal lesion. Hyperostosis frontal temporalis Sinuses/Orbits: No acute finding. Other: The mastoid air cells are well aerated. CT CERVICAL SPINE  FINDINGS Alignment: No listhesis. Skull base and vertebrae: No acute fracture. No primary bone lesion or focal pathologic process. Soft tissues and spinal canal: No prevertebral fluid or swelling. No visible canal hematoma. Disc levels: Degenerative changes in the cervical spine. No significant spinal canal stenosis. Upper chest: Negative. IMPRESSION: 1. No acute intracranial process. 2. No acute fracture or traumatic listhesis of the cervical spine. Electronically Signed   By: Merilyn Baba M.D.   On: 08/11/2022 19:05   CT Cervical Spine Wo Contrast  Result Date: 08/11/2022 CLINICAL DATA:  Fall EXAM: CT HEAD WITHOUT CONTRAST CT CERVICAL SPINE WITHOUT CONTRAST TECHNIQUE: Multidetector CT imaging of the head and cervical spine was performed following the standard protocol without intravenous contrast. Multiplanar CT image reconstructions of the cervical spine were also generated. RADIATION DOSE REDUCTION: This exam was performed according to the departmental dose-optimization program which includes automated exposure control, adjustment of the  mA and/or kV according to patient size and/or use of iterative reconstruction technique. COMPARISON:  07/13/2003 CT head; correlation is made with MRI head and cervical spine 06/09/2022 FINDINGS: CT HEAD FINDINGS Brain: No evidence of acute infarct, hemorrhage, mass, mass effect, or midline shift. No hydrocephalus or extra-axial fluid collection. Vascular: No hyperdense vessel. Skull: Negative for fracture or focal lesion. Hyperostosis frontal temporalis Sinuses/Orbits: No acute finding. Other: The mastoid air cells are well aerated. CT CERVICAL SPINE FINDINGS Alignment: No listhesis. Skull base and vertebrae: No acute fracture. No primary bone lesion or focal pathologic process. Soft tissues and spinal canal: No prevertebral fluid or swelling. No visible canal hematoma. Disc levels: Degenerative changes in the cervical spine. No significant spinal canal stenosis. Upper chest: Negative. IMPRESSION: 1. No acute intracranial process. 2. No acute fracture or traumatic listhesis of the cervical spine. Electronically Signed   By: Merilyn Baba M.D.   On: 08/11/2022 19:05   DG C-Arm 1-60 Min-No Report  Result Date: 08/07/2022 Fluoroscopy was utilized by the requesting physician.  No radiographic interpretation.   IR  NEPHROURETERAL CATH PLACE LEFT  Result Date: 08/06/2022 INDICATION: History of left-sided nephrolithiasis, post attempted though ultimately unsuccessful ureteral scopic stone removal. Patient presents today for image guided placement of a nephroureteral catheter for impending nephrolithotomy procedure scheduled for later this week at [facility]: 1. FLUOROSCOPIC GUIDANCE FOR PUNCTURE OF THE LEFT SIDED RENAL COLLECTING SYSTEM. 2. FLUOROSCOPIC GUIDED PLACEMENT OF A LEFT SIDED NEPHROURETERAL CATHETER. COMPARISON:  CT abdomen and pelvis - 07/22/2018 09/09/2021 Fluoroscopic images during left-sided ureteral stent placement - 02/07/2022 MEDICATIONS: None ANESTHESIA/SEDATION: Moderate (conscious)  sedation was employed during this procedure. A total of Benadryl 50 mg, Versed 6 mg and Fentanyl 300 mcg was administered intravenously. Moderate Sedation Time: 58 minutes. The patient's level of consciousness and vital signs were monitored continuously by radiology nursing throughout the procedure under my direct supervision. CONTRAST:  20 cc Omnipaque 300 - administered into the renal collecting system. FLUOROSCOPY TIME:  19 minutes, 18 seconds (99991111 mGy) COMPLICATIONS: SIR LEVEL B - Normal therapy, includes overnight admission for observation. Procedure complicated by displacement of pre-existing left-sided ureteral stent to the level of the urinary bladder. Procedure also complicated by shearing of the tip of a Nitrex wire into the right left collecting system during one of the attempts at percutaneous access. PROCEDURE: Informed written consent was obtained from the patient after a discussion of the risks, benefits, and alternatives to treatment. The flank flank region was prepped with Betadine in a sterile fashion, and a sterile drape was applied covering the  operative field. A sterile gown and sterile gloves were used for the procedure. A timeout was performed prior to the initiation of the procedure. A pre procedural spot fluoroscopic image was obtained of the upper abdomen. Sonographic evaluation of the right flank demonstrates a minimal to moderate amount of right-sided pelvicaliectasis, similar to preceding abdominal CT performed 07/22/2022 Attempts were initially made to access the echogenic stone containing calyx with sonographic guidance however this ultimately proved unsuccessful As such, under direct fluoroscopic guidance, the stone containing radiopaque calyx was accessed with a 22 gauge Chiba needle. Appropriate positioning within the collecting system was confirmed with limited contrast injection Despite prolonged efforts, a Nitrex wire could not be advanced to the level of the renal pelvis  including inadvertent shearing of the tip of the radiopaque wire off into the right renal collecting system. Ultimately, the stone containing inferior pole calyx was targeted with an additional 22 gauge Chiba needle and ultimately after great difficulty, the Nitrex wire was advanced through the renal pelvis to the superior aspect of the left ureter, adjacent to the superior aspect of the left ureteral stent. Under fluoroscopic guidance, access needle was exchanged for a Accustick set allowing advancement of a Amplatz wire to the level of the urinary bladder. Under fluoroscopic guidance, the Accustick catheter was exchanged for a Kumpe catheter however during this attempted exchange, the ureteral stent was displaced caudally to the level of the distal aspect of the left ureter. As such, decision was made to place a definitive left-sided nephroureteral catheter Under fluoroscopic guidance, the Kumpe catheter was exchanged for a 10.2 French, 22 cm nephroureteral catheter which ultimately resulted in displacement of the ureteral stent to the level of the urinary bladder. The nephroureteral catheter was appropriately positioned with superior coil within the renal pelvis and inferior coil within the urinary bladder. Contrast injection confirmed appropriate positioning and functionality The nephroureteral catheter was secured at the skin entrance site within interrupted suture, flushed with a small amount of saline and connected to a gravity bag. Dressings were applied. The patient tolerated the procedure well without immediate postprocedural complication. FINDINGS: Pre procedural spot radiographic images demonstrates unchanged size and appearance of the approximately 0.9 x 0.7 cm radiopaque stone overlying expected location of the inferior pole of the left kidney. The superior aspect of the left-sided double-J ureteral stent overlies expected location of the renal pelvis. Sonographic evaluation demonstrates mild to  moderate dilatation of the left renal collecting system, similar to preceding abdominal CT performed 07/22/2022. Attempts were made to access the echogenic stone containing left renal pelvis with ultrasound guidance however this ultimately proved unsuccessful As such, the stone containing posteroinferior calyx was targeted fluoroscopically. There is great difficulty advancing the wire to the level of the renal pelvis, including shearing the distal tip of the access wire within the renal pelvis, however this ultimately was successfully achieved. During initial attempt to place the Kumpe catheter, there was migration of the ureteral stent to the level of the distal aspect of the distal ureter. As such, a nephroureteral catheter was placed with superior coil within the renal pelvis and inferior coil within the urinary bladder, ultimately with displacement of the entirety of the ureteral stent to the level of the urinary bladder. IMPRESSION: Technically challenging though ultimately successful placement of a left-sided double-J ureteral stent with superior coil within the renal pelvis and inferior coil within the urinary bladder to be utilized during impending nephrolithotomy. Access to the collecting system was complicated by shearing of the distal tip  of the access wire within the renal pelvis and displacement of the ureteral stent to the level of the urinary bladder. Above was discussed with referring urologist, Dr. Alyson Ingles, at time of procedure completion who is planning on removing her ureteral stent tomorrow in his office and attempting foreign body retrieval at the time of nephrolithotomy later this week. Above was also discussed at great length with the patient and the patient's father. Electronically Signed   By: Sandi Mariscal M.D.   On: 08/06/2022 12:13    Disposition: Discharge disposition: 01-Home or Self Care       Discharge Instructions     Discharge patient   Complete by: As directed     Discharge disposition: 01-Home or Self Care   Discharge patient date: 08/08/2022        Follow-up Information     Cleon Gustin, MD. Call on 08/11/2022.   Specialty: Urology Why: 11AM Contact information: [address]  [place] [id number] [phone]                  Signed: Nicolette Bang 08/12/2022, 7:49 AM

## 2022-08-12 NOTE — Discharge Instructions (Addendum)
It was our pleasure to provide your ER care today - we hope that you feel better.  Drink plenty of fluids/stay well hydrated. Fall precautions.   Take meds as prescribed by your doctor and make sure to NEVER take medications in over the prescribed or recommended dose.  In addition, you are noted to be on several meds with sedating side effects - minimize the use of pain meds, and follow up closely with your doctor/psychiatrist this week, bring ALL of your meds to those appointments, and discuss plan with your meds moving forward (with goal of minimizing the sedating side effects).   From the lab tests, your potassium level is mildly low - eat plent of fruits and vegetables, take potassium supplement as prescribed, and follow up with your doctor.   Return to ER right away if worse, new symptoms, fevers, chest pain, trouble breathing, severe abdominal pain, or other concern.

## 2022-08-13 ENCOUNTER — Ambulatory Visit (INDEPENDENT_AMBULATORY_CARE_PROVIDER_SITE_OTHER): Payer: Medicare HMO | Admitting: Urology

## 2022-08-13 VITALS — BP 110/70 | HR 93

## 2022-08-13 DIAGNOSIS — N200 Calculus of kidney: Secondary | ICD-10-CM

## 2022-08-13 MED ORDER — CIPROFLOXACIN HCL 500 MG PO TABS: 500.0000 mg | ORAL_TABLET | Freq: Once | ORAL | Status: DC

## 2022-08-14 LAB — CALCULI, WITH PHOTOGRAPH (CLINICAL LAB)
Calcium Oxalate Monohydrate: 10 %
Hydroxyapatite: 90 %
Weight Calculi: 289 mg

## 2022-08-15 ENCOUNTER — Ambulatory Visit: Payer: Self-pay | Admitting: *Deleted

## 2022-08-15 NOTE — Patient Outreach (Signed)
Care Coordination   Follow Up Visit Note   02/22/2023 late entry for 08/15/22 Name: [name] MRN: [id number] DOB: 1970/08/22  [name] is a 52 y.o. year old female who sees Anabel Halon, MD for primary care. I spoke with  [name] by phone today.  What matters to the patients health and wellness today?  Fall on 08/11/22 Sabrina bathroom on tile - history of low blood pressure  She reports not know what happens before nor after her falls    Kidney stone removal- EEG Sabrina May 2024    History of constipation  Coping with medical conditions - reports she has a good support system with her family  She wrote down the [facility] 24 hour nurse line number and has acces to RN Cm number    Goals Addressed               This Visit's Progress     Patient Stated     Decrease Falls, pain, improve sleep (RN CM) (pt-stated)   Not on track     Interventions Today    Flowsheet Row Most Recent Value  Chronic Disease   Chronic disease during today's visit Hypertension (HTN), Chronic Kidney Disease/End Stage Renal Disease (ESRD), Other  [recent ED visit for fall, hospitaliation to remove kidney stone/CKD, hypotension-managing medicines to prevent, vasovagal stimulation, coping,]  General Interventions   General Interventions Discussed/Reviewed General Interventions Reviewed, General Interventions Discussed, Community Resources  Doctor Visits Discussed/Reviewed Doctor Visits Reviewed, Specialist  PCP/Specialist Visits Compliance with follow-up visit  Education Interventions   Education Provided Provided Education  [hypotensin, holding BP medicines when blood pressure is low, vasovagal/constipation]  Mental Health Interventions   Mental Health Discussed/Reviewed Coping Strategies, Mental Health Reviewed            COMPLETED: improve sleep (THN) (pt-stated)        Duplicate RN CM goal closure      COMPLETED: manage pain on left side (THN) (pt-stated)        Duplicate RN CM goal  closure        SDOH assessments and interventions completed:  No     Care Coordination Interventions:  Yes, provided   Follow up plan: Follow up call scheduled for 03/03/23    Encounter Outcome:  Pt. Visit Completed   [name], RN, BSN, CCM [name] Care Management Community Coordinator Office number [phone]  Patient Active Problem List   Diagnosis Date Noted   Tardive dyskinesia 11/21/2022   Screening for colon cancer 11/18/2022   Renal calculus 08/07/2022   Acute cystitis without hematuria 08/01/2022   Recurrent falls 04/04/2022   Vasovagal syncope 12/30/2021   Mixed hyperlipidemia 09/05/2021   Bipolar disorder Sabrina partial remission (HCC) 09/03/2021   Recurrent nephrolithiasis 09/03/2021   LGSIL on Pap smear of cervix 08/17/2020   Enlarged uterus 08/06/2020   Depo-Provera contraceptive status 08/06/2020   Essential hypertension, benign 03/15/2019   Subclinical hypothyroidism 03/15/2019   Obesity (BMI 30-39.9) 03/15/2019   Vitamin D deficiency disease 03/15/2019   CKD (chronic kidney disease) stage 3, GFR 30-59 ml/min (HCC) 03/15/2019

## 2022-08-15 NOTE — Progress Notes (Signed)
08/13/2022 5:08 PM   [name] [birth year] [id number]  Referring provider: Lindell Spar, MD [address] [place],  [place] [address]  No chief complaint on file.   HPI: [name] is a 52yo here for nephrostomy tube removal   PMH: Past Medical History:  Diagnosis Date   Anxiety    Chronic kidney disease    CKD (chronic kidney disease) stage 3, GFR 30-59 ml/min ([place]) 03/15/2019   Depression    Diarrhea 05/04/2017   Essential hypertension, benign 03/15/2019   History of kidney stones    Hypertension    Hypothyroidism    Hypothyroidism, adult 03/15/2019   Kidney stones    LGSIL on Pap smear of cervix 08/17/2020   Needs colpo per [facility] immediate risk is 4.3 for CIN 3+risk    Obesity (BMI 30-39.9) 03/15/2019   Renal disorder    Vitamin D deficiency disease 03/15/2019    Surgical History: Past Surgical History:  Procedure Laterality Date   BIOPSY  06/05/2017   Procedure: BIOPSY;  Surgeon: Sabrina Houston, MD;  [facility]: [facility];  Service: Endoscopy;;  colon   CHOLECYSTECTOMY     COLONOSCOPY N/A 06/05/2017   Procedure: COLONOSCOPY;  Surgeon: Sabrina Houston, MD;  [facility]: [facility];  Service: Endoscopy;  Laterality: N/A;  8:30   CYSTOSCOPY WITH RETROGRADE PYELOGRAM, URETEROSCOPY AND STENT PLACEMENT Left 10/04/2020   Procedure: CYSTOSCOPY WITH LEFT RETROGRADE PYELOGRAM, DIAGNOSTIC LEFT URETEROSCOPY AND LEFT URETEREAL STENT PLACEMENT;  Surgeon: Sabrina Gustin, MD;  Location: [facility];  Service: Urology;  Laterality: Left;   CYSTOSCOPY WITH RETROGRADE PYELOGRAM, URETEROSCOPY AND STENT PLACEMENT Left 10/16/2020   Procedure: CYSTOSCOPY WITH LEFT RETROGRADE PYELOGRAM, LEFT URETEROSCOPY  WITH LASER AND LEFT URETERAL STENT EXCHANGE;  Surgeon: Sabrina Gustin, MD;  Location: [facility];  Service: Urology;  Laterality: Left;   CYSTOSCOPY WITH RETROGRADE PYELOGRAM, URETEROSCOPY AND STENT PLACEMENT Bilateral 02/17/2022   Procedure: CYSTOSCOPY WITH RETROGRADE PYELOGRAM,  URETEROSCOPY AND STENT PLACEMENT;  Surgeon: Sabrina Gustin, MD;  Location: [facility];  Service: Urology;  Laterality: Bilateral;   CYSTOSCOPY WITH RETROGRADE PYELOGRAM, URETEROSCOPY AND STENT PLACEMENT Left 06/26/2022   Procedure: CYSTOSCOPY WITH RETROGRADE PYELOGRAM, URETEROSCOPY AND STENT PLACEMENT;  Surgeon: Sabrina Gustin, MD;  Location: [facility];  Service: Urology;  Laterality: Left;   HOLMIUM LASER APPLICATION Left [id number]   Procedure: HOLMIUM LASER APPLICATION;  Surgeon: Sabrina Gustin, MD;  Location: [facility];  Service: Urology;  Laterality: Left;   HOLMIUM LASER APPLICATION Left [id number]   Procedure: HOLMIUM LASER APPLICATION;  Surgeon: Sabrina Gustin, MD;  Location: [facility];  Service: Urology;  Laterality: Left;   IR  NEPHROURETERAL CATH PLACE LEFT  08/05/2022   KIDNEY STONE SURGERY     kidney stones     LASER ABLATION CONDOLAMATA N/A 10/31/2020   Procedure: LASER ABLATION OF THE CERVIX;  Surgeon: Sabrina Buff, MD;  Location: [facility];  Service: Gynecology;  Laterality: N/A;    Home Medications:  Allergies as of 08/13/2022       Reactions   Morphine And Related Itching, Hives   Sulfa Antibiotics Swelling   Tape Itching, Other (See Comments)   Surgical         Medication List        Accurate as of August 13, 2022 11:59 PM. If you have any questions, ask your nurse or doctor.          alfuzosin 10 MG 24 hr tablet Commonly known as:  Uroxatral Take 1 tablet (10 mg total) by mouth daily with breakfast.   ALPRAZolam 1 MG tablet Commonly known as: XANAX Take 1 mg by mouth 3 (three) times daily as needed for anxiety.   amLODipine 5 MG tablet Commonly known as: NORVASC Take 1 tablet (5 mg total) by mouth in the morning and at bedtime.   ARIPiprazole 5 MG tablet Commonly known as: ABILIFY Take 5 mg by mouth at bedtime.   atorvastatin 20 MG tablet Commonly known as: LIPITOR Take 1 tablet (20 mg total) by mouth daily.   carvedilol 25 MG tablet Commonly  known as: COREG Take 25 mg by mouth 2 (two) times daily.   cephALEXin 500 MG capsule Commonly known as: KEFLEX Take 500 mg by mouth 3 (three) times daily.   chlorthalidone 25 MG tablet Commonly known as: HYGROTON Take 12.5 mg by mouth daily.   doxepin 75 MG capsule Commonly known as: SINEQUAN Take 225 mg by mouth at bedtime.   Ingrezza 60 MG capsule Generic drug: valbenazine Take 60 mg by mouth at bedtime.   lamoTRIgine 200 MG tablet Commonly known as: LAMICTAL Take 200 mg by mouth 2 (two) times daily.   losartan 25 MG tablet Commonly known as: COZAAR Take 25 mg by mouth in the morning.   nitrofurantoin 50 MG capsule Commonly known as: MACRODANTIN Take 1 capsule (50 mg total) by mouth at bedtime.   ondansetron 4 MG tablet Commonly known as: Zofran Take 1 tablet (4 mg total) by mouth daily as needed for nausea or vomiting.   oxyCODONE-acetaminophen 10-325 MG tablet Commonly known as: PERCOCET TAKE 1 TABLET BY MOUTH EVERY 6 HOURS AS NEEDED FOR PAIN   potassium citrate 10 MEQ (1080 MG) SR tablet Commonly known as: UROCIT-K Take 20 mEq by mouth in the morning and at bedtime.   QUEtiapine 400 MG 24 hr tablet Commonly known as: SEROQUEL XR Take 400 mg by mouth at bedtime.   Vitamin D3 125 MCG (5000 UT) Tabs Generic drug: Cholecalciferol Take 10,000 Units by mouth daily.        Allergies:  Allergies  Allergen Reactions   Morphine And Related Itching and Hives   Sulfa Antibiotics Swelling   Tape Itching and Other (See Comments)    Surgical     Family History: Family History  Problem Relation Age of Onset   Cancer Paternal Grandmother    Heart failure Paternal Grandmother    Cancer Maternal Grandmother    Cancer Father    Heart failure Father    Lung cancer Mother    Colon cancer Neg Hx     Social History:  reports that she quit smoking about 6 months ago. Her smoking use included cigarettes. She has a 10.00 pack-year smoking history. She has never  used smokeless tobacco. She reports that she does not drink alcohol and does not use drugs.  ROS: All other review of systems were reviewed and are negative except what is noted above in HPI  Physical Exam: BP 110/70   Pulse 93   Constitutional:  Alert and oriented, No acute distress. HEENT: [name] AT, moist mucus membranes.  Trachea midline, no masses. Cardiovascular: No clubbing, cyanosis, or edema. Respiratory: Normal respiratory effort, no increased work of breathing. GI: Abdomen is soft, nontender, nondistended, no abdominal masses GU: No CVA tenderness.  Lymph: No cervical or inguinal lymphadenopathy. Skin: No rashes, bruises or suspicious lesions. Neurologic: Grossly intact, no focal deficits, moving all 4 extremities. Psychiatric: Normal mood and affect.  Laboratory  Data: Lab Results  Component Value Date   WBC 8.8 08/11/2022   HGB 12.8 08/11/2022   HCT 38.9 08/11/2022   MCV 91.7 08/11/2022   PLT 324 08/11/2022    Lab Results  Component Value Date   CREATININE 1.46 (H) 08/11/2022    No results found for: "PSA"  No results found for: "TESTOSTERONE"  Lab Results  Component Value Date   HGBA1C 5.2 09/04/2021    Urinalysis    Component Value Date/Time   COLORURINE YELLOW 08/11/2022 1856   APPEARANCEUR CLEAR 08/11/2022 1856   APPEARANCEUR Cloudy (A) 07/01/2022 1339   LABSPEC 1.015 08/11/2022 1856   PHURINE 6.0 08/11/2022 1856   GLUCOSEU NEGATIVE 08/11/2022 1856   HGBUR MODERATE (A) 08/11/2022 1856   BILIRUBINUR NEGATIVE 08/11/2022 1856   BILIRUBINUR Negative 07/01/2022 1339   KETONESUR NEGATIVE 08/11/2022 1856   PROTEINUR 100 (A) 08/11/2022 1856   UROBILINOGEN 0.2 01/17/2009 0124   NITRITE NEGATIVE 08/11/2022 1856   LEUKOCYTESUR TRACE (A) 08/11/2022 1856    Lab Results  Component Value Date   LABMICR See below: 07/01/2022   WBCUA >30 (A) 07/01/2022   LABEPIT 0-10 07/01/2022   MUCUS Present 09/23/2021   BACTERIA RARE (A) 08/11/2022    Pertinent  Imaging:  Results for orders placed during the hospital encounter of 08/28/20  Abdomen 1 view (KUB)  Narrative CLINICAL DATA:  Nephrolithiasis  EXAM: ABDOMEN - 1 VIEW  COMPARISON:  CT 08/21/2020  FINDINGS: The bowel gas pattern is normal. 8 mm stone again seen projecting over the left renal shadow. No right sided renal calculi, which is partially obscured by overlying bowel gas and stool. Moderate volume stool throughout the colon. No acute osseous findings. Other significant radiographic abnormality are seen.  IMPRESSION: 8 mm stone projecting over the left renal shadow, similar to prior exam.   Electronically Signed By: Davina Poke D.O. On: 08/29/2020 10:41  No results found for this or any previous visit.  No results found for this or any previous visit.  No results found for this or any previous visit.  Results for orders placed during the hospital encounter of 05/21/22  Ultrasound renal complete  Narrative CLINICAL DATA:  History of stones.  Follow-up.  EXAM: RENAL / URINARY TRACT ULTRASOUND COMPLETE  COMPARISON:  August 30, 2021  FINDINGS: Right Kidney:  Renal measurements: 10 x 5 x 4 cm = volume: 106 mL. Echogenicity within normal limits. No mass or hydronephrosis visualized.  Left Kidney:  Renal measurements: 7.6 x 4.1 x 3.9 cm = volume: 63 mL. Contains 11 mm nonobstructive stone in the lower pole.  Bladder:  Appears normal for degree of bladder distention.  Other:  The bladder is decompressed and not assessed on today's study.  IMPRESSION: 1. 11 mm nonobstructive stone in the lower pole of the left kidney. 2. No other abnormalities. The bladder was not assessed due to lack of distention.   Electronically Signed By: Dorise Bullion III M.D. On: 05/21/2022 16:40  No valid procedures specified. Results for orders placed during the hospital encounter of 09/09/21  CT HEMATURIA WORKUP  Narrative CLINICAL DATA:  Gross hematuria,  chronic cystitis  EXAM: CT ABDOMEN AND PELVIS WITHOUT AND WITH CONTRAST  TECHNIQUE: Multidetector CT imaging of the abdomen and pelvis was performed following the standard protocol before and following the bolus administration of intravenous contrast.  RADIATION DOSE REDUCTION: This exam was performed according to the departmental dose-optimization program which includes automated exposure control, adjustment of the mA and/or kV according to  patient size and/or use of iterative reconstruction technique.  CONTRAST:  44mL OMNIPAQUE IOHEXOL 300 MG/ML  SOLN  COMPARISON:  12/17/2020  FINDINGS: Lower chest: No acute abnormality. Bandlike scarring of the bilateral lung bases.  Hepatobiliary: No focal liver abnormality is seen. Status post cholecystectomy. No biliary dilatation.  Pancreas: Unremarkable. No pancreatic ductal dilatation or surrounding inflammatory changes.  Spleen: Normal in size without significant abnormality.  Adrenals/Urinary Tract: Adrenal glands are unremarkable. Multiple small bilateral nonobstructive renal calculi. No ureteral calculi or hydronephrosis. Relatively atrophic, lobular appearance of the left kidney. No suspicious mass or contrast enhancement. No urinary tract filling defect on delayed phase imaging. Bladder is unremarkable.  Stomach/Bowel: Stomach is within normal limits. Appendix appears normal. No evidence of bowel wall thickening, distention, or inflammatory changes.  Vascular/Lymphatic: No significant vascular findings are present. No enlarged abdominal or pelvic lymph nodes.  Reproductive: No mass or other significant abnormality.  Other: No abdominal wall hernia or abnormality. No ascites.  Musculoskeletal: No acute or significant osseous findings.  IMPRESSION: 1. Multiple small bilateral nonobstructive renal calculi. No ureteral calculi or hydronephrosis. 2. Relatively atrophic, lobular appearance of the left kidney, in keeping  with prior obstructive, infectious, or ischemic insult. 3. No suspicious mass or contrast enhancement. No urinary tract filling defect on delayed phase imaging. 4. Normal bladder.   Electronically Signed By: Delanna Ahmadi M.D. On: 09/09/2021 10:55  Results for orders placed in visit on 08/21/20  CT RENAL STONE STUDY  Narrative CLINICAL DATA:  Left flank pain for 3 months. Hematuria. Nephrolithiasis.  EXAM: CT ABDOMEN AND PELVIS WITHOUT CONTRAST  TECHNIQUE: Multidetector CT imaging of the abdomen and pelvis was performed following the standard protocol without IV contrast.  COMPARISON:  03/21/2019  FINDINGS: Lower chest: No acute findings.  Hepatobiliary: No mass visualized on this unenhanced exam. Prior cholecystectomy. No evidence of biliary obstruction.  Pancreas: No mass or inflammatory process visualized on this unenhanced exam.  Spleen:  Within normal limits in size.  Adrenals/Urinary tract: Moderate diffuse left renal parenchymal atrophy and scarring is again seen. A few renal calculi are seen bilaterally, largest in the lower pole of the left kidney measuring 8 mm. No evidence of ureteral calculi or hydronephrosis. Unremarkable unopacified urinary bladder.  Stomach/Bowel: No evidence of obstruction, inflammatory process, or abnormal fluid collections. Normal appendix visualized.  Vascular/Lymphatic: No pathologically enlarged lymph nodes identified. No evidence of abdominal aortic aneurysm.  Reproductive:  No mass or other significant abnormality.  Other:  None.  Musculoskeletal:  No suspicious bone lesions identified.  IMPRESSION: Bilateral nephrolithiasis. No evidence of ureteral calculi, hydronephrosis, or other acute findings.  Chronic diffuse left renal parenchymal atrophy and scarring.   Electronically Signed By: Marlaine Hind M.D. On: 08/21/2020 16:45   Assessment & Plan:    1. Nephrolithiasis Nephrostomy tube removed today Followup  in 1 week for stent removal - Urinalysis, Routine w reflex microscopic   No follow-ups on file.  Nicolette Bang, MD  [facility] [place]

## 2022-08-18 ENCOUNTER — Encounter (HOSPITAL_COMMUNITY): Payer: Self-pay | Admitting: Urology

## 2022-08-19 ENCOUNTER — Ambulatory Visit: Payer: Medicare HMO | Admitting: Urology

## 2022-08-19 VITALS — BP 117/77 | HR 89

## 2022-08-19 DIAGNOSIS — Z466 Encounter for fitting and adjustment of urinary device: Secondary | ICD-10-CM | POA: Diagnosis not present

## 2022-08-19 DIAGNOSIS — Z87442 Personal history of urinary calculi: Secondary | ICD-10-CM

## 2022-08-19 DIAGNOSIS — N200 Calculus of kidney: Secondary | ICD-10-CM

## 2022-08-19 LAB — MICROSCOPIC EXAMINATION: RBC, Urine: >30 /hpf — AB (ref 0–2)

## 2022-08-19 LAB — URINALYSIS, ROUTINE W REFLEX MICROSCOPIC
Bilirubin, UA: NEGATIVE
Glucose, UA: NEGATIVE
Ketones, UA: NEGATIVE
Nitrite, UA: NEGATIVE
Specific Gravity, UA: 1.02 (ref 1.005–1.030)
Urobilinogen, Ur: 0.2 mg/dL (ref 0.2–1.0)
pH, UA: 7 (ref 5.0–7.5)

## 2022-08-19 MED ORDER — CIPROFLOXACIN HCL 500 MG PO TABS
500.0000 mg | ORAL_TABLET | Freq: Once | ORAL | Status: AC
  Administered 2022-08-19: 500 mg via ORAL

## 2022-08-19 NOTE — Progress Notes (Signed)
   08/19/22  CC: followup nephrolithiasis   HPI: [name] is a 52yo here for ureteral stent removal Blood pressure 117/77, pulse 89. NED. A&Ox3.   No respiratory distress   Abd soft, NT, ND Normal external genitalia with patent urethral meatus  Cystoscopy Procedure Note  Patient identification was confirmed, informed consent was obtained, and patient was prepped using Betadine solution.  Lidocaine jelly was administered per urethral meatus.    Procedure: - Flexible cystoscope introduced, without any difficulty.   - Thorough search of the bladder revealed:    normal urethral meatus    normal urothelium    no stones    no ulcers     no tumors    no urethral polyps    no trabeculation  - Ureteral orifices were normal in position and appearance. -using a grasper the left ureteral stent was removed intact  Post-Procedure: - Patient tolerated the procedure well  Assessment/ Plan: Followup 6 weeks with a renal US   No follow-ups on file.  Wilkie Aye, MD

## 2022-08-21 ENCOUNTER — Encounter: Payer: Self-pay | Admitting: Urology

## 2022-08-21 NOTE — Patient Instructions (Signed)

## 2022-08-25 ENCOUNTER — Other Ambulatory Visit: Payer: Self-pay | Admitting: Urology

## 2022-08-26 ENCOUNTER — Telehealth: Payer: Self-pay

## 2022-08-26 NOTE — Telephone Encounter (Signed)
-----   Message from Cameron Sprang, MD sent at 08/25/2022 11:52 AM EDT ----- Pls let her know bloodwork was normal, thanks

## 2022-08-26 NOTE — Telephone Encounter (Signed)
Pt called an informed bloodwork was normal

## 2022-08-29 DIAGNOSIS — R809 Proteinuria, unspecified: Secondary | ICD-10-CM | POA: Diagnosis not present

## 2022-08-29 DIAGNOSIS — I129 Hypertensive chronic kidney disease with stage 1 through stage 4 chronic kidney disease, or unspecified chronic kidney disease: Secondary | ICD-10-CM | POA: Diagnosis not present

## 2022-08-29 DIAGNOSIS — N200 Calculus of kidney: Secondary | ICD-10-CM | POA: Diagnosis not present

## 2022-08-29 DIAGNOSIS — N1832 Chronic kidney disease, stage 3b: Secondary | ICD-10-CM | POA: Diagnosis not present

## 2022-08-29 DIAGNOSIS — R82991 Hypocitraturia: Secondary | ICD-10-CM | POA: Diagnosis not present

## 2022-08-29 DIAGNOSIS — E876 Hypokalemia: Secondary | ICD-10-CM | POA: Diagnosis not present

## 2022-09-05 ENCOUNTER — Other Ambulatory Visit: Payer: Medicare HMO | Admitting: Obstetrics & Gynecology

## 2022-09-05 ENCOUNTER — Telehealth: Payer: Self-pay | Admitting: Neurology

## 2022-09-05 NOTE — Telephone Encounter (Signed)
Pt called an given the number to the hospital [phone]

## 2022-09-05 NOTE — Telephone Encounter (Signed)
Pt called in stating she is supposed to be getting an inpatient EEG on Monday and she is trying to cancel it, but she doesn't think the number she has is correct. She is wondering if we had the correct phone number?

## 2022-09-08 ENCOUNTER — Inpatient Hospital Stay: Admission: RE | Admit: 2022-09-08 | Payer: Medicare HMO | Source: Home / Self Care | Admitting: Neurology

## 2022-09-15 DIAGNOSIS — H52209 Unspecified astigmatism, unspecified eye: Secondary | ICD-10-CM | POA: Diagnosis not present

## 2022-09-15 DIAGNOSIS — H5203 Hypermetropia, bilateral: Secondary | ICD-10-CM | POA: Diagnosis not present

## 2022-09-15 DIAGNOSIS — H524 Presbyopia: Secondary | ICD-10-CM | POA: Diagnosis not present

## 2022-09-15 DIAGNOSIS — F314 Bipolar disorder, current episode depressed, severe, without psychotic features: Secondary | ICD-10-CM | POA: Diagnosis not present

## 2022-09-15 DIAGNOSIS — F4321 Adjustment disorder with depressed mood: Secondary | ICD-10-CM | POA: Diagnosis not present

## 2022-09-15 DIAGNOSIS — Z5181 Encounter for therapeutic drug level monitoring: Secondary | ICD-10-CM | POA: Diagnosis not present

## 2022-09-15 DIAGNOSIS — F3175 Bipolar disorder, in partial remission, most recent episode depressed: Secondary | ICD-10-CM | POA: Diagnosis not present

## 2022-09-15 DIAGNOSIS — F3131 Bipolar disorder, current episode depressed, mild: Secondary | ICD-10-CM | POA: Diagnosis not present

## 2022-09-19 ENCOUNTER — Ambulatory Visit (HOSPITAL_COMMUNITY): Payer: Medicare HMO

## 2022-09-22 ENCOUNTER — Other Ambulatory Visit: Payer: Self-pay | Admitting: Urology

## 2022-09-26 ENCOUNTER — Other Ambulatory Visit: Payer: Medicare HMO | Admitting: Obstetrics & Gynecology

## 2022-09-30 DIAGNOSIS — N1831 Chronic kidney disease, stage 3a: Secondary | ICD-10-CM | POA: Diagnosis not present

## 2022-09-30 DIAGNOSIS — N200 Calculus of kidney: Secondary | ICD-10-CM | POA: Diagnosis not present

## 2022-09-30 DIAGNOSIS — N2581 Secondary hyperparathyroidism of renal origin: Secondary | ICD-10-CM | POA: Diagnosis not present

## 2022-10-03 ENCOUNTER — Ambulatory Visit: Payer: Medicare HMO | Admitting: Urology

## 2022-10-07 DIAGNOSIS — H52209 Unspecified astigmatism, unspecified eye: Secondary | ICD-10-CM | POA: Diagnosis not present

## 2022-10-07 DIAGNOSIS — H524 Presbyopia: Secondary | ICD-10-CM | POA: Diagnosis not present

## 2022-10-07 DIAGNOSIS — H5203 Hypermetropia, bilateral: Secondary | ICD-10-CM | POA: Diagnosis not present

## 2022-10-14 ENCOUNTER — Ambulatory Visit (HOSPITAL_COMMUNITY): Payer: Medicare HMO

## 2022-10-21 ENCOUNTER — Encounter: Payer: Self-pay | Admitting: Obstetrics & Gynecology

## 2022-10-21 ENCOUNTER — Ambulatory Visit (INDEPENDENT_AMBULATORY_CARE_PROVIDER_SITE_OTHER): Payer: Medicare HMO | Admitting: Obstetrics & Gynecology

## 2022-10-21 ENCOUNTER — Other Ambulatory Visit (HOSPITAL_COMMUNITY)
Admission: RE | Admit: 2022-10-21 | Discharge: 2022-10-21 | Disposition: A | Discharge status: Home / Self Care | Payer: Medicare HMO | Source: Ambulatory Visit | Attending: Obstetrics & Gynecology | Admitting: Obstetrics & Gynecology

## 2022-10-21 VITALS — BP 110/73 | HR 78 | Ht 62.0 in | Wt 194.0 lb

## 2022-10-21 DIAGNOSIS — Z01419 Encounter for gynecological examination (general) (routine) without abnormal findings: Secondary | ICD-10-CM

## 2022-10-21 DIAGNOSIS — Z1231 Encounter for screening mammogram for malignant neoplasm of breast: Secondary | ICD-10-CM

## 2022-10-21 NOTE — Progress Notes (Signed)
Subjective:     [name] is a 52 y.o. female here for a routine exam.  No LMP recorded. Patient has had an injection. G1P0010 Birth Control Method:  menopausal?  Off depo for 3 months Menstrual Calendar(currently): amenorrhea  Current complaints: none.   Current acute medical issues:  Bipolar   Recent Gynecologic History No LMP recorded. Patient has had an injection. Last Pap: 12/10/21,  normal Last mammogram: 11/2020,  normal  Past Medical History:  Diagnosis Date   Anxiety    Chronic kidney disease    CKD (chronic kidney disease) stage 3, GFR 30-59 ml/min (HCC) 03/15/2019   Depression    Diarrhea 05/04/2017   Essential hypertension, benign 03/15/2019   History of kidney stones    Hypertension    Hypothyroidism    Hypothyroidism, adult 03/15/2019   Kidney stones    LGSIL on Pap smear of cervix 08/17/2020   Needs colpo per [facility] immediate risk is 4.3 for CIN 3+risk    Obesity (BMI 30-39.9) 03/15/2019   Renal disorder    Vitamin D deficiency disease 03/15/2019    Past Surgical History:  Procedure Laterality Date   BIOPSY  06/05/2017   Procedure: BIOPSY;  Surgeon: Malissa Hippo, MD;  [facility]: [facility];  Service: Endoscopy;;  colon   CHOLECYSTECTOMY     COLONOSCOPY N/A 06/05/2017   Procedure: COLONOSCOPY;  Surgeon: Malissa Hippo, MD;  Location: [facility];  Service: Endoscopy;  Laterality: N/A;  8:30   CYSTOSCOPY WITH RETROGRADE PYELOGRAM, URETEROSCOPY AND STENT PLACEMENT Left 10/04/2020   Procedure: CYSTOSCOPY WITH LEFT RETROGRADE PYELOGRAM, DIAGNOSTIC LEFT URETEROSCOPY AND LEFT URETEREAL STENT PLACEMENT;  Surgeon: Malen Gauze, MD;  Location: [facility];  Service: Urology;  Laterality: Left;   CYSTOSCOPY WITH RETROGRADE PYELOGRAM, URETEROSCOPY AND STENT PLACEMENT Left 10/16/2020   Procedure: CYSTOSCOPY WITH LEFT RETROGRADE PYELOGRAM, LEFT URETEROSCOPY  WITH LASER AND LEFT URETERAL STENT EXCHANGE;  Surgeon: Malen Gauze, MD;  Location: [facility];  Service:  Urology;  Laterality: Left;   CYSTOSCOPY WITH RETROGRADE PYELOGRAM, URETEROSCOPY AND STENT PLACEMENT Bilateral 02/17/2022   Procedure: CYSTOSCOPY WITH RETROGRADE PYELOGRAM, URETEROSCOPY AND STENT PLACEMENT;  Surgeon: Malen Gauze, MD;  Location: [facility];  Service: Urology;  Laterality: Bilateral;   CYSTOSCOPY WITH RETROGRADE PYELOGRAM, URETEROSCOPY AND STENT PLACEMENT Left 06/26/2022   Procedure: CYSTOSCOPY WITH RETROGRADE PYELOGRAM, URETEROSCOPY AND STENT PLACEMENT;  Surgeon: Malen Gauze, MD;  Location: [facility];  Service: Urology;  Laterality: Left;   HOLMIUM LASER APPLICATION Left 10/16/2020   Procedure: HOLMIUM LASER APPLICATION;  Surgeon: Malen Gauze, MD;  Location: [facility];  Service: Urology;  Laterality: Left;   HOLMIUM LASER APPLICATION Left 06/26/2022   Procedure: HOLMIUM LASER APPLICATION;  Surgeon: Malen Gauze, MD;  Location: [facility];  Service: Urology;  Laterality: Left;   IR  NEPHROURETERAL CATH PLACE LEFT  08/05/2022   KIDNEY STONE SURGERY     kidney stones     LASER ABLATION CONDOLAMATA N/A 10/31/2020   Procedure: LASER ABLATION OF THE CERVIX;  Surgeon: Lazaro Arms, MD;  Location: [facility];  Service: Gynecology;  Laterality: N/A;   NEPHROLITHOTOMY Left 08/07/2022   Procedure: NEPHROLITHOTOMY PERCUTANEOUS;  Surgeon: Malen Gauze, MD;  Location: [facility];  Service: Urology;  Laterality: Left;    OB History     Gravida  1   Para      Term      Preterm      AB  1   Living  0      SAB      IAB      Ectopic      Multiple      Live Births              Social History   Socioeconomic History   Marital status: Divorced    Spouse name: Not on file   Number of children: Not on file   Years of education: Not on file   Highest education level: Not on file  Occupational History   Not on file  Tobacco Use   Smoking status: Former    Packs/day: 0.50    Years: 20.00    Additional pack years: 0.00    Total pack years: 10.00    Types:  Cigarettes    Quit date: 02/08/2022    Years since quitting: 0.6   Smokeless tobacco: Never  Vaping Use   Vaping Use: Never used  Substance and Sexual Activity   Alcohol use: No   Drug use: No   Sexual activity: Not Currently    Birth control/protection: Injection  Other Topics Concern   Not on file  Social History Narrative   Divorced twice,1st lasted 5 years,2nd 4 years.On disabilty secondary to mental illness since 2004.Previously used to [employer].Lives alone,has 2 cats.cright handed    Are you right handed or left handed? right   Are you currently employed ? no   What is your current occupation?   Do you live at home alone?no   Who lives with you? dad   What type of home do you live in: 1 story or 2 story? one    Caffeine 2 cans of soda   Social Determinants of Health   Financial Resource Strain: Low Risk  (07/21/2022)   Overall Financial Resource Strain (CARDIA)    Difficulty of Paying Living Expenses: Not very hard  Food Insecurity: No Food Insecurity (08/07/2022)   Hunger Vital Sign    Worried About Running Out of Food in the Last Year: Never true    Ran Out of Food in the Last Year: Never true  Transportation Needs: No Transportation Needs (08/07/2022)   PRAPARE - Administrator, Civil Service (Medical): No    Lack of Transportation (Non-Medical): No  Physical Activity: Insufficiently Active (08/06/2020)   Exercise Vital Sign    Days of Exercise per Week: 2 days    Minutes of Exercise per Session: 10 min  Stress: No Stress Concern Present (07/21/2022)   [employer] of Occupational Health - Occupational Stress Questionnaire    Feeling of Stress : Only a little  Social Connections: Socially Isolated (07/21/2022)   Social Connection and Isolation Panel [NHANES]    Frequency of Communication with Friends and Family: More than three times a week    Frequency of Social Gatherings with Friends and Family: Three times a week    Attends Religious  Services: Never    Active Member of Clubs or Organizations: No    Attends Banker Meetings: Never    Marital Status: Divorced    Family History  Problem Relation Age of Onset   Cancer Paternal Grandmother    Heart failure Paternal Grandmother    Cancer Maternal Grandmother    Cancer Father    Heart failure Father    Lung cancer Mother    Colon cancer Neg Hx      Current Outpatient Medications:    ALPRAZolam (XANAX) 1 MG tablet, Take 1  mg by mouth 3 (three) times daily as needed for anxiety. , Disp: , Rfl:    amLODipine (NORVASC) 5 MG tablet, Take 1 tablet (5 mg total) by mouth in the morning and at bedtime., Disp: 60 tablet, Rfl: 2   ARIPiprazole (ABILIFY) 5 MG tablet, Take 5 mg by mouth at bedtime., Disp: , Rfl:    atorvastatin (LIPITOR) 20 MG tablet, Take 1 tablet (20 mg total) by mouth daily., Disp: 90 tablet, Rfl: 3   carvedilol (COREG) 25 MG tablet, Take 25 mg by mouth 2 (two) times daily., Disp: , Rfl:    chlorthalidone (HYGROTON) 25 MG tablet, Take 12.5 mg by mouth daily., Disp: , Rfl:    Cholecalciferol (VITAMIN D3) 125 MCG (5000 UT) TABS, Take 10,000 Units by mouth daily., Disp: , Rfl:    doxepin (SINEQUAN) 75 MG capsule, Take 225 mg by mouth at bedtime. , Disp: , Rfl:    INGREZZA 60 MG capsule, Take 60 mg by mouth at bedtime., Disp: , Rfl:    lamoTRIgine (LAMICTAL) 200 MG tablet, Take 200 mg by mouth 2 (two) times daily., Disp: , Rfl:    losartan (COZAAR) 25 MG tablet, Take 25 mg by mouth in the morning., Disp: , Rfl:    nitrofurantoin (MACRODANTIN) 50 MG capsule, TAKE ONE CAPSULE BY MOUTH AT BEDTIME, Disp: 30 capsule, Rfl: 11   potassium citrate (UROCIT-K) 10 MEQ (1080 MG) SR tablet, Take 20 mEq by mouth in the morning and at bedtime., Disp: , Rfl:    QUEtiapine (SEROQUEL XR) 400 MG 24 hr tablet, Take 400 mg by mouth at bedtime. , Disp: , Rfl:    alfuzosin (UROXATRAL) 10 MG 24 hr tablet, Take 1 tablet (10 mg total) by mouth daily with breakfast. (Patient not  taking: Reported on 10/21/2022), Disp: 30 tablet, Rfl: 0   cephALEXin (KEFLEX) 500 MG capsule, Take 500 mg by mouth 3 (three) times daily. (Patient not taking: Reported on 10/21/2022), Disp: , Rfl:    ondansetron (ZOFRAN) 4 MG tablet, Take 1 tablet (4 mg total) by mouth daily as needed for nausea or vomiting. (Patient not taking: Reported on 10/21/2022), Disp: 30 tablet, Rfl: 1   oxyCODONE-acetaminophen (PERCOCET) 10-325 MG tablet, TAKE 1 TABLET BY MOUTH EVERY 6 HOURS AS NEEDED FOR PAIN (Patient not taking: Reported on 10/21/2022), Disp: 30 tablet, Rfl: 0  Review of Systems  Review of Systems  Constitutional: Negative for fever, chills, weight loss, malaise/fatigue and diaphoresis.  HENT: Negative for hearing loss, ear pain, nosebleeds, congestion, sore throat, neck pain, tinnitus and ear discharge.   Eyes: Negative for blurred vision, double vision, photophobia, pain, discharge and redness.  Respiratory: Negative for cough, hemoptysis, sputum production, shortness of breath, wheezing and stridor.   Cardiovascular: Negative for chest pain, palpitations, orthopnea, claudication, leg swelling and PND.  Gastrointestinal: negative for abdominal pain. Negative for heartburn, nausea, vomiting, diarrhea, constipation, blood in stool and melena.  Genitourinary: Negative for dysuria, urgency, frequency, hematuria and flank pain.  Musculoskeletal: Negative for myalgias, back pain, joint pain and falls.  Skin: Negative for itching and rash.  Neurological: Negative for dizziness, tingling, tremors, sensory change, speech change, focal weakness, seizures, loss of consciousness, weakness and headaches.  Endo/Heme/Allergies: Negative for environmental allergies and polydipsia. Does not bruise/bleed easily.  Psychiatric/Behavioral: Negative for depression, suicidal ideas, hallucinations, memory loss and substance abuse. The patient is not nervous/anxious and does not have insomnia.        Objective:  Blood  pressure 110/73, pulse 78, height 5\' 2"  (  1.575 m), weight 194 lb (88 kg).   Physical Exam  Vitals reviewed. Constitutional: She is oriented to person, place, and time. She appears well-developed and well-nourished.  HENT:  Head: Normocephalic and atraumatic.        Right Ear: External ear normal.  Left Ear: External ear normal.  Nose: Nose normal.  Mouth/Throat: Oropharynx is clear and moist.  Eyes: Conjunctivae and EOM are normal. Pupils are equal, round, and reactive to light. Right eye exhibits no discharge. Left eye exhibits no discharge. No scleral icterus.  Neck: Normal range of motion. Neck supple. No tracheal deviation present. No thyromegaly present.  Cardiovascular: Normal rate, regular rhythm, normal heart sounds and intact distal pulses.  Exam reveals no gallop and no friction rub.   No murmur heard. Respiratory: Effort normal and breath sounds normal. No respiratory distress. She has no wheezes. She has no rales. She exhibits no tenderness.  GI: Soft. Bowel sounds are normal. She exhibits no distension and no mass. There is no tenderness. There is no rebound and no guarding.  Genitourinary:  Breasts no masses skin changes or nipple changes bilaterally      Vulva is normal without lesions Vagina is pink moist without discharge Cervix normal in appearance and pap is done Uterus is normal size shape and contour Adnexa is negative with normal sized ovaries   Musculoskeletal: Normal range of motion. She exhibits no edema and no tenderness.  Neurological: She is alert and oriented to person, place, and time. She has normal reflexes. She displays normal reflexes. No cranial nerve deficit. She exhibits normal muscle tone. Coordination normal.  Skin: Skin is warm and dry. No rash noted. No erythema. No pallor.  Psychiatric: She has a normal mood and affect. Her behavior is normal. Judgment and thought content normal.       Medications Ordered at today's visit: No orders of the  defined types were placed in this encounter.   Other orders placed at today's visit: No orders of the defined types were placed in this encounter.     Assessment:    Normal Gyn exam.   Hx of HSIL s/p laser ablation of the cervix 2022 Plan:    None BCM needed Cytology + HPV testing 1 year Mammogram ordered     Return in about 1 year (around 10/21/2023).

## 2022-10-24 LAB — CYTOLOGY - PAP
Diagnosis: NEGATIVE
Diagnosis: REACTIVE

## 2022-11-06 ENCOUNTER — Telehealth: Payer: Self-pay | Admitting: Neurology

## 2022-11-06 NOTE — Telephone Encounter (Signed)
error 

## 2022-11-12 ENCOUNTER — Ambulatory Visit (HOSPITAL_COMMUNITY)
Admission: RE | Admit: 2022-11-12 | Discharge: 2022-11-12 | Disposition: A | Discharge status: Home / Self Care | Payer: Medicare HMO | Source: Ambulatory Visit | Attending: Internal Medicine | Admitting: Internal Medicine

## 2022-11-12 DIAGNOSIS — Z1231 Encounter for screening mammogram for malignant neoplasm of breast: Secondary | ICD-10-CM | POA: Insufficient documentation

## 2022-11-14 ENCOUNTER — Other Ambulatory Visit (HOSPITAL_COMMUNITY): Payer: Self-pay | Admitting: Internal Medicine

## 2022-11-14 DIAGNOSIS — R928 Other abnormal and inconclusive findings on diagnostic imaging of breast: Secondary | ICD-10-CM

## 2022-11-18 ENCOUNTER — Ambulatory Visit (INDEPENDENT_AMBULATORY_CARE_PROVIDER_SITE_OTHER): Payer: Medicare HMO | Admitting: Internal Medicine

## 2022-11-18 ENCOUNTER — Encounter: Payer: Self-pay | Admitting: Internal Medicine

## 2022-11-18 VITALS — BP 104/67 | HR 91 | Ht 62.0 in | Wt 191.2 lb

## 2022-11-18 DIAGNOSIS — N1832 Chronic kidney disease, stage 3b: Secondary | ICD-10-CM | POA: Diagnosis not present

## 2022-11-18 DIAGNOSIS — I10 Essential (primary) hypertension: Secondary | ICD-10-CM | POA: Diagnosis not present

## 2022-11-18 DIAGNOSIS — Z1211 Encounter for screening for malignant neoplasm of colon: Secondary | ICD-10-CM | POA: Diagnosis not present

## 2022-11-18 DIAGNOSIS — G2401 Drug induced subacute dyskinesia: Secondary | ICD-10-CM

## 2022-11-18 DIAGNOSIS — F3170 Bipolar disorder, currently in remission, most recent episode unspecified: Secondary | ICD-10-CM

## 2022-11-18 MED ORDER — AMLODIPINE BESYLATE 5 MG PO TABS: 5.0000 mg | ORAL_TABLET | Freq: Every day | ORAL | 5 refills | Status: DC

## 2022-11-18 NOTE — Progress Notes (Signed)
Established Patient Office Visit  Subjective:  Patient ID: [name], female    DOB: February 19, 1971  Age: 52 y.o. MRN: [id number]  CC:  Chief Complaint  Patient presents with   Referral    Patient is wanting referral to neurologist. She also mentioned needing a referral to gastrology for colonoscopy    HPI [name] is a 52 y.o. female with past medical history of HTN, CKD stage III, bipolar disorder and nephrolithiasis who presents for f/u of her chronic medical conditions.  HTN: BP was well controlled today.  She takes amlodipine, losartan, chlorthalidone and carvedilol currently.  She has seen improvement in dizziness since stopping clonidine.  Denies any headache, chest pain or palpitations.  Tardive dyskinesia: She has constant twitching of the mouth and the lower jaw.  Of note, she is currently on Abilify 5 mg nightly and Seroquel 400 mg nightly.  She is already taking Ingrezza for extrapyramidal side effects of antipsychotics.  She asked about neurology referral.  After discussion, she agrees to discuss it with psychiatrist to decrease dose of Seroquel and ask if she needs Abilify.  Recurrent falls: She still reports recurrent falls, but frequency has decreased now.  She has seen neurologist, and has had MRI of the brain and cervical spine, which were unremarkable to suggest any specific etiology for falls.  She reports that she has been having LOC for few seconds before the fall and remains confused after the fall as well.  Denies any episode of shaking.  She also had EEG, which was also unremarkable.  She reports chronic leg weakness.   CKD:  Followed by Nephrology.  Denies any dysuria, hematuria or urinary hesitancy or resistance.   Recurrent nephrolithiasis: She had nephrolithotomy in 03/24. She currently denies any dysuria, hematuria, flank pain, fever, chills or nausea/vomiting.    Past Medical History:  Diagnosis Date   Anxiety    Chronic kidney disease    CKD  (chronic kidney disease) stage 3, GFR 30-59 ml/min (HCC) 03/15/2019   Depression    Diarrhea 05/04/2017   Essential hypertension, benign 03/15/2019   History of kidney stones    Hypertension    Hypothyroidism    Hypothyroidism, adult 03/15/2019   Kidney stones    LGSIL on Pap smear of cervix 08/17/2020   Needs colpo per [facility] immediate risk is 4.3 for CIN 3+risk    Obesity (BMI 30-39.9) 03/15/2019   Renal disorder    Vitamin D deficiency disease 03/15/2019    Past Surgical History:  Procedure Laterality Date   BIOPSY  06/05/2017   Procedure: BIOPSY;  Surgeon: Malissa Hippo, MD;  [facility]: [facility];  Service: Endoscopy;;  colon   CHOLECYSTECTOMY     COLONOSCOPY N/A 06/05/2017   Procedure: COLONOSCOPY;  Surgeon: Malissa Hippo, MD;  Location: [facility];  Service: Endoscopy;  Laterality: N/A;  8:30   CYSTOSCOPY WITH RETROGRADE PYELOGRAM, URETEROSCOPY AND STENT PLACEMENT Left 10/04/2020   Procedure: CYSTOSCOPY WITH LEFT RETROGRADE PYELOGRAM, DIAGNOSTIC LEFT URETEROSCOPY AND LEFT URETEREAL STENT PLACEMENT;  Surgeon: Malen Gauze, MD;  Location: [facility];  Service: Urology;  Laterality: Left;   CYSTOSCOPY WITH RETROGRADE PYELOGRAM, URETEROSCOPY AND STENT PLACEMENT Left 10/16/2020   Procedure: CYSTOSCOPY WITH LEFT RETROGRADE PYELOGRAM, LEFT URETEROSCOPY  WITH LASER AND LEFT URETERAL STENT EXCHANGE;  Surgeon: Malen Gauze, MD;  Location: [facility];  Service: Urology;  Laterality: Left;   CYSTOSCOPY WITH RETROGRADE PYELOGRAM, URETEROSCOPY AND STENT PLACEMENT Bilateral 02/17/2022   Procedure: CYSTOSCOPY  WITH RETROGRADE PYELOGRAM, URETEROSCOPY AND STENT PLACEMENT;  Surgeon: Malen Gauze, MD;  Location: [facility];  Service: Urology;  Laterality: Bilateral;   CYSTOSCOPY WITH RETROGRADE PYELOGRAM, URETEROSCOPY AND STENT PLACEMENT Left 06/26/2022   Procedure: CYSTOSCOPY WITH RETROGRADE PYELOGRAM, URETEROSCOPY AND STENT PLACEMENT;  Surgeon: Malen Gauze, MD;  Location: [facility];   Service: Urology;  Laterality: Left;   HOLMIUM LASER APPLICATION Left 10/16/2020   Procedure: HOLMIUM LASER APPLICATION;  Surgeon: Malen Gauze, MD;  Location: [facility];  Service: Urology;  Laterality: Left;   HOLMIUM LASER APPLICATION Left 06/26/2022   Procedure: HOLMIUM LASER APPLICATION;  Surgeon: Malen Gauze, MD;  Location: [facility];  Service: Urology;  Laterality: Left;   IR  NEPHROURETERAL CATH PLACE LEFT  08/05/2022   KIDNEY STONE SURGERY     kidney stones     LASER ABLATION CONDOLAMATA N/A 10/31/2020   Procedure: LASER ABLATION OF THE CERVIX;  Surgeon: Lazaro Arms, MD;  Location: [facility];  Service: Gynecology;  Laterality: N/A;   NEPHROLITHOTOMY Left 08/07/2022   Procedure: NEPHROLITHOTOMY PERCUTANEOUS;  Surgeon: Malen Gauze, MD;  Location: [facility];  Service: Urology;  Laterality: Left;    Family History  Problem Relation Age of Onset   Cancer Paternal Grandmother    Heart failure Paternal Grandmother    Cancer Maternal Grandmother    Cancer Father    Heart failure Father    Lung cancer Mother    Colon cancer Neg Hx     Social History   Socioeconomic History   Marital status: Divorced    Spouse name: Not on file   Number of children: Not on file   Years of education: Not on file   Highest education level: Not on file  Occupational History   Not on file  Tobacco Use   Smoking status: Former    Packs/day: 0.50    Years: 20.00    Additional pack years: 0.00    Total pack years: 10.00    Types: Cigarettes    Quit date: 02/08/2022    Years since quitting: 0.7   Smokeless tobacco: Never  Vaping Use   Vaping Use: Never used  Substance and Sexual Activity   Alcohol use: No   Drug use: No   Sexual activity: Not Currently    Birth control/protection: Injection  Other Topics Concern   Not on file  Social History Narrative   Divorced twice,1st lasted 5 years,2nd 4 years.On disabilty secondary to mental illness since 2004.Previously used to [employer].Lives alone,has 2 cats.cright handed    Are you right handed or left handed? right   Are you currently employed ? no   What is your current occupation?   Do you live at home alone?no   Who lives with you? dad   What type of home do you live in: 1 story or 2 story? one    Caffeine 2 cans of soda   Social Determinants of Health   Financial Resource Strain: Low Risk  (07/21/2022)   Overall Financial Resource Strain (CARDIA)    Difficulty of Paying Living Expenses: Not very hard  Food Insecurity: No Food Insecurity (08/07/2022)   Hunger Vital Sign    Worried About Running Out of Food in the Last Year: Never true    Ran Out of Food in the Last Year: Never true  Transportation Needs: No Transportation Needs (08/07/2022)   PRAPARE - Administrator, Civil Service (Medical): No  Lack of Transportation (Non-Medical): No  Physical Activity: Insufficiently Active (08/06/2020)   Exercise Vital Sign    Days of Exercise per Week: 2 days    Minutes of Exercise per Session: 10 min  Stress: No Stress Concern Present (07/21/2022)   [employer] of Occupational Health - Occupational Stress Questionnaire    Feeling of Stress : Only a little  Social Connections: Socially Isolated (07/21/2022)   Social Connection and Isolation Panel [NHANES]    Frequency of Communication with Friends and Family: More than three times a week    Frequency of Social Gatherings with Friends and Family: Three times a week    Attends Religious Services: Never    Active Member of Clubs or Organizations: No    Attends Banker Meetings: Never    Marital Status: Divorced  Catering manager Violence: Not At Risk (08/07/2022)   Humiliation, Afraid, Rape, and Kick questionnaire    Fear of Current or Ex-Partner: No    Emotionally Abused: No    Physically Abused: No    Sexually Abused: No    Outpatient Medications Prior to Visit  Medication Sig Dispense Refill   alfuzosin (UROXATRAL) 10 MG 24 hr  tablet Take 1 tablet (10 mg total) by mouth daily with breakfast. (Patient not taking: Reported on 10/21/2022) 30 tablet 0   ALPRAZolam (XANAX) 1 MG tablet Take 1 mg by mouth 3 (three) times daily as needed for anxiety.      ARIPiprazole (ABILIFY) 5 MG tablet Take 5 mg by mouth at bedtime.     atorvastatin (LIPITOR) 20 MG tablet Take 1 tablet (20 mg total) by mouth daily. 90 tablet 3   carvedilol (COREG) 25 MG tablet Take 25 mg by mouth 2 (two) times daily.     cephALEXin (KEFLEX) 500 MG capsule Take 500 mg by mouth 3 (three) times daily. (Patient not taking: Reported on 10/21/2022)     chlorthalidone (HYGROTON) 25 MG tablet Take 12.5 mg by mouth daily.     Cholecalciferol (VITAMIN D3) 125 MCG (5000 UT) TABS Take 10,000 Units by mouth daily.     doxepin (SINEQUAN) 75 MG capsule Take 225 mg by mouth at bedtime.      INGREZZA 60 MG capsule Take 60 mg by mouth at bedtime.     lamoTRIgine (LAMICTAL) 200 MG tablet Take 200 mg by mouth 2 (two) times daily.     losartan (COZAAR) 25 MG tablet Take 25 mg by mouth in the morning.     nitrofurantoin (MACRODANTIN) 50 MG capsule TAKE ONE CAPSULE BY MOUTH AT BEDTIME 30 capsule 11   ondansetron (ZOFRAN) 4 MG tablet Take 1 tablet (4 mg total) by mouth daily as needed for nausea or vomiting. (Patient not taking: Reported on 10/21/2022) 30 tablet 1   oxyCODONE-acetaminophen (PERCOCET) 10-325 MG tablet TAKE 1 TABLET BY MOUTH EVERY 6 HOURS AS NEEDED FOR PAIN (Patient not taking: Reported on 10/21/2022) 30 tablet 0   potassium citrate (UROCIT-K) 10 MEQ (1080 MG) SR tablet Take 20 mEq by mouth in the morning and at bedtime.     QUEtiapine (SEROQUEL XR) 400 MG 24 hr tablet Take 400 mg by mouth at bedtime.      amLODipine (NORVASC) 5 MG tablet Take 1 tablet (5 mg total) by mouth in the morning and at bedtime. 60 tablet 2   No facility-administered medications prior to visit.    Allergies  Allergen Reactions   Morphine And Codeine Itching and Hives   Sulfa Antibiotics  Swelling  Tape Itching and Other (See Comments)    Surgical     ROS Review of Systems  Constitutional:  Negative for chills and fever.  HENT:  Negative for congestion, sinus pressure, sinus pain and sore throat.   Eyes:  Negative for pain and discharge.  Respiratory:  Negative for cough and shortness of breath.   Cardiovascular:  Negative for chest pain and palpitations.  Gastrointestinal:  Negative for abdominal pain, diarrhea, nausea and vomiting.  Endocrine: Negative for polydipsia and polyuria.  Genitourinary:  Negative for dysuria and hematuria.  Musculoskeletal:  Negative for neck pain and neck stiffness.  Skin:  Negative for rash.  Neurological:  Negative for dizziness and weakness.       Involuntary movements of mouth and lower jaw  Psychiatric/Behavioral:  Negative for agitation and behavioral problems. The patient is nervous/anxious.       Objective:    Physical Exam Vitals reviewed.  Constitutional:      General: She is not in acute distress.    Appearance: She is not diaphoretic.  HENT:     Head: Normocephalic and atraumatic.     Nose: Nose normal.     Mouth/Throat:     Mouth: Mucous membranes are moist.  Eyes:     General: No scleral icterus.    Extraocular Movements: Extraocular movements intact.  Cardiovascular:     Rate and Rhythm: Normal rate and regular rhythm.     Pulses: Normal pulses.     Heart sounds: Normal heart sounds. No murmur heard. Pulmonary:     Breath sounds: Normal breath sounds. No wheezing or rales.  Musculoskeletal:     Cervical back: Neck supple. No tenderness.     Right lower leg: No edema.     Left lower leg: No edema.  Skin:    General: Skin is warm.     Findings: No rash.  Neurological:     General: No focal deficit present.     Mental Status: She is alert and oriented to person, place, and time.     Sensory: No sensory deficit.     Motor: Weakness (B/l LE - 4/5) present.     Comments: Involuntary movement of mouth and  lower jaw-tardive dyskinesia  Psychiatric:        Mood and Affect: Mood normal.        Behavior: Behavior normal.     BP 104/67 (BP Location: Left Arm, Patient Position: Sitting, Cuff Size: Large)   Pulse 91   Ht 5\' 2"  (1.575 m)   Wt 191 lb 3.2 oz (86.7 kg)   SpO2 95%   BMI 34.97 kg/m  Wt Readings from Last 3 Encounters:  11/18/22 191 lb 3.2 oz (86.7 kg)  10/21/22 194 lb (88 kg)  08/11/22 196 lb (88.9 kg)    Lab Results  Component Value Date   TSH 4.61 (H) 07/04/2020   Lab Results  Component Value Date   WBC 8.8 08/11/2022   HGB 12.8 08/11/2022   HCT 38.9 08/11/2022   MCV 91.7 08/11/2022   PLT 324 08/11/2022   Lab Results  Component Value Date   NA 139 08/11/2022   K 3.3 (L) 08/11/2022   CO2 26 08/11/2022   GLUCOSE 82 08/11/2022   BUN 16 08/11/2022   CREATININE 1.46 (H) 08/11/2022   BILITOT 0.6 08/11/2022   ALKPHOS 146 (H) 08/11/2022   AST 28 08/11/2022   ALT 23 08/11/2022   PROT 7.1 08/11/2022   ALBUMIN 3.6 08/11/2022   CALCIUM  9.1 08/11/2022   ANIONGAP 12 08/11/2022   Lab Results  Component Value Date   CHOL 244 (H) 09/04/2021   Lab Results  Component Value Date   HDL 44 (L) 09/04/2021   Lab Results  Component Value Date   LDLCALC 161 (H) 09/04/2021   Lab Results  Component Value Date   TRIG 218 (H) 09/04/2021   Lab Results  Component Value Date   CHOLHDL 5.5 (H) 09/04/2021   Lab Results  Component Value Date   HGBA1C 5.2 09/04/2021      Assessment & Plan:   Problem List Items Addressed This Visit       Cardiovascular and Mediastinum   Essential hypertension, benign - Primary    BP Readings from Last 1 Encounters:  11/18/22 104/67  Well-controlled with amlodipine, Losartan, Coreg and chlorthalidone Followed by Nephrology Episodes of dizziness could be due to dehydration and orthostatic hypotension - improved since discontinuing clonidine in the last visit Needs to improve hydration Counseled for compliance with the  medications Advised DASH diet and moderate exercise/walking, at least 150 mins/week      Relevant Medications   amLODipine (NORVASC) 5 MG tablet     Nervous and Auditory   Tardive dyskinesia    Likely due to duplicate antipsychotics-on Abilify and Seroquel Needs to discuss with psychiatrist about need of Abilify and to decrease dose of Seroquel She is already on Ingrezza Advised to contact psychiatrist first and then discuss need of neurologist later if she has persistent symptoms despite adjusting antipsychotic        Genitourinary   CKD (chronic kidney disease) stage 3, GFR 30-59 ml/min (HCC)    Likely due to uncontrolled HTN in the past Followed by Nephrology - last visit note reviewed On Losartan and Chlorthalidone Avoid nephrotoxic agents        Other   Bipolar disorder in partial remission (HCC)    Followed by psychiatry On Abilify, doxepin, Lamictal and Seroquel Takes Xanax as needed for anxiety She is on multiple sedative medications, which can also cause falls - advised to discuss with Psychiatrist to decrease dose of Seroquel or Doxepin if appropriate and discontinue Abilify as she has tardive dyskinesia despite being on benztropine      Screening for colon cancer    Asymptomatic currently Referred to GI for colonoscopy      Relevant Orders   Ambulatory referral to Gastroenterology    Meds ordered this encounter  Medications   amLODipine (NORVASC) 5 MG tablet    Sig: Take 1 tablet (5 mg total) by mouth daily.    Dispense:  30 tablet    Refill:  5    Follow-up: Return in about 4 months (around 03/20/2023) for HTN and CKD.    Anabel Halon, MD

## 2022-11-18 NOTE — Patient Instructions (Addendum)
Please ask your Psychiatrist about Abilify and Seroquel.  Please take Amlodipine once daily instead of twice daily.  Please continue to take other medications as prescribed.

## 2022-11-21 DIAGNOSIS — G2401 Drug induced subacute dyskinesia: Secondary | ICD-10-CM | POA: Insufficient documentation

## 2022-11-21 HISTORY — DX: Drug induced subacute dyskinesia: G24.01

## 2022-11-21 NOTE — Assessment & Plan Note (Signed)
Asymptomatic currently Referred to GI for colonoscopy

## 2022-11-21 NOTE — Assessment & Plan Note (Addendum)
Followed by psychiatry On Abilify, doxepin, Lamictal and Seroquel Takes Xanax as needed for anxiety She is on multiple sedative medications, which can also cause falls - advised to discuss with Psychiatrist to decrease dose of Seroquel or Doxepin if appropriate and discontinue Abilify as she has tardive dyskinesia despite being on benztropine

## 2022-11-21 NOTE — Assessment & Plan Note (Signed)
Likely due to uncontrolled HTN in the past Followed by Nephrology - last visit note reviewed On Losartan and Chlorthalidone Avoid nephrotoxic agents 

## 2022-11-21 NOTE — Assessment & Plan Note (Signed)
Likely due to duplicate antipsychotics-on Abilify and Seroquel Needs to discuss with psychiatrist about need of Abilify and to decrease dose of Seroquel She is already on Ingrezza Advised to contact psychiatrist first and then discuss need of neurologist later if she has persistent symptoms despite adjusting antipsychotic

## 2022-11-21 NOTE — Assessment & Plan Note (Signed)
BP Readings from Last 1 Encounters:  11/18/22 104/67   Well-controlled with amlodipine, Losartan, Coreg and chlorthalidone Followed by Nephrology Episodes of dizziness could be due to dehydration and orthostatic hypotension - improved since discontinuing clonidine in the last visit Needs to improve hydration Counseled for compliance with the medications Advised DASH diet and moderate exercise/walking, at least 150 mins/week

## 2022-11-25 ENCOUNTER — Encounter (INDEPENDENT_AMBULATORY_CARE_PROVIDER_SITE_OTHER): Payer: Self-pay | Admitting: *Deleted

## 2022-11-27 ENCOUNTER — Inpatient Hospital Stay (HOSPITAL_COMMUNITY): Admission: RE | Admit: 2022-11-27 | Payer: Medicare HMO | Source: Ambulatory Visit

## 2022-11-27 ENCOUNTER — Ambulatory Visit (HOSPITAL_COMMUNITY): Payer: Medicare HMO

## 2022-12-09 ENCOUNTER — Ambulatory Visit (HOSPITAL_COMMUNITY)
Admission: RE | Admit: 2022-12-09 | Discharge: 2022-12-09 | Disposition: A | Discharge status: Home / Self Care | Payer: Medicare HMO | Source: Ambulatory Visit | Attending: Internal Medicine | Admitting: Internal Medicine

## 2022-12-09 ENCOUNTER — Other Ambulatory Visit (HOSPITAL_COMMUNITY): Payer: Self-pay | Admitting: Internal Medicine

## 2022-12-09 DIAGNOSIS — R928 Other abnormal and inconclusive findings on diagnostic imaging of breast: Secondary | ICD-10-CM | POA: Insufficient documentation

## 2022-12-09 DIAGNOSIS — N6002 Solitary cyst of left breast: Secondary | ICD-10-CM | POA: Diagnosis not present

## 2022-12-09 DIAGNOSIS — N6322 Unspecified lump in the left breast, upper inner quadrant: Secondary | ICD-10-CM | POA: Diagnosis not present

## 2022-12-09 DIAGNOSIS — R92333 Mammographic heterogeneous density, bilateral breasts: Secondary | ICD-10-CM | POA: Diagnosis not present

## 2022-12-11 ENCOUNTER — Encounter: Payer: Self-pay | Admitting: Internal Medicine

## 2022-12-17 ENCOUNTER — Ambulatory Visit (HOSPITAL_COMMUNITY)
Admission: RE | Admit: 2022-12-17 | Discharge: 2022-12-17 | Disposition: A | Discharge status: Home / Self Care | Payer: Medicare HMO | Source: Ambulatory Visit | Attending: Internal Medicine | Admitting: Internal Medicine

## 2022-12-17 DIAGNOSIS — R928 Other abnormal and inconclusive findings on diagnostic imaging of breast: Secondary | ICD-10-CM | POA: Insufficient documentation

## 2022-12-17 DIAGNOSIS — N6002 Solitary cyst of left breast: Secondary | ICD-10-CM | POA: Diagnosis not present

## 2022-12-17 MED ORDER — LIDOCAINE-EPINEPHRINE (PF) 1 %-1:200000 IJ SOLN
INTRAMUSCULAR | Status: AC
  Filled 2022-12-17: qty 30

## 2022-12-17 MED ORDER — LIDOCAINE-EPINEPHRINE (PF) 1 %-1:200000 IJ SOLN
10.0000 mL | Freq: Once | INTRAMUSCULAR | Status: AC
  Administered 2022-12-17: 10 mL via INTRADERMAL

## 2022-12-17 MED ORDER — LIDOCAINE HCL (PF) 2 % IJ SOLN
10.0000 mL | Freq: Once | INTRAMUSCULAR | Status: AC
  Administered 2022-12-17: 10 mL

## 2022-12-17 MED ORDER — LIDOCAINE HCL (PF) 2 % IJ SOLN
INTRAMUSCULAR | Status: AC
  Filled 2022-12-17: qty 10

## 2022-12-17 NOTE — Progress Notes (Signed)
PT tolerated left breast aspiration well today with NAD noted. PT verbalized understanding of discharge instructions and given ice packs.

## 2022-12-18 ENCOUNTER — Encounter: Payer: Medicare HMO | Admitting: Internal Medicine

## 2023-01-09 DIAGNOSIS — N1831 Chronic kidney disease, stage 3a: Secondary | ICD-10-CM | POA: Diagnosis not present

## 2023-01-09 DIAGNOSIS — N200 Calculus of kidney: Secondary | ICD-10-CM | POA: Diagnosis not present

## 2023-01-19 ENCOUNTER — Telehealth: Payer: Self-pay | Admitting: Neurology

## 2023-01-19 DIAGNOSIS — E876 Hypokalemia: Secondary | ICD-10-CM | POA: Diagnosis not present

## 2023-01-19 DIAGNOSIS — R809 Proteinuria, unspecified: Secondary | ICD-10-CM | POA: Diagnosis not present

## 2023-01-19 DIAGNOSIS — N200 Calculus of kidney: Secondary | ICD-10-CM | POA: Diagnosis not present

## 2023-01-19 DIAGNOSIS — N1831 Chronic kidney disease, stage 3a: Secondary | ICD-10-CM | POA: Diagnosis not present

## 2023-01-19 NOTE — Telephone Encounter (Signed)
Patient called and said she needs to see Dr.Aquino for new symptoms she is having. She thinks she has parkinson's but she is not sure. She is falling, having memory issues, her jaw is jerking. She was on ingrezza for tics but her psychiatrist took her off of it. Her psychiatrist put her on Benztropine.

## 2023-01-26 NOTE — Telephone Encounter (Signed)
I got patient schedule for 02-09-23 at 1:00. Patient is aware of the appt

## 2023-01-26 NOTE — Telephone Encounter (Signed)
Ok for Sept 9 at 1pm opening, thanks

## 2023-01-28 ENCOUNTER — Other Ambulatory Visit: Payer: Self-pay | Admitting: Internal Medicine

## 2023-01-28 DIAGNOSIS — I10 Essential (primary) hypertension: Secondary | ICD-10-CM

## 2023-02-09 ENCOUNTER — Encounter: Payer: Self-pay | Admitting: Neurology

## 2023-02-09 ENCOUNTER — Other Ambulatory Visit (INDEPENDENT_AMBULATORY_CARE_PROVIDER_SITE_OTHER): Payer: Medicare HMO

## 2023-02-09 ENCOUNTER — Ambulatory Visit: Payer: Medicare HMO | Admitting: Neurology

## 2023-02-09 VITALS — BP 121/70 | HR 87 | Ht 62.0 in | Wt 212.6 lb

## 2023-02-09 DIAGNOSIS — R413 Other amnesia: Secondary | ICD-10-CM

## 2023-02-09 NOTE — Patient Instructions (Addendum)
Good to see you.  Have bloodwork done for TSH, B12  2. Please discuss your medications with Psychiatry and see how much they can be minimized but still control your symptoms.  3. Follow-up in 4-5 months, call for any changes   RECOMMENDATIONS FOR ALL PATIENTS WITH MEMORY PROBLEMS: 1. Continue to exercise (Recommend 30 minutes of walking everyday, or 3 hours every week) 2. Increase social interactions - continue going to [place] and enjoy social gatherings with friends and family 3. Eat healthy, avoid fried foods and eat more fruits and vegetables 4. Maintain adequate blood pressure, blood sugar, and blood cholesterol level. Reducing the risk of stroke and cardiovascular disease also helps promoting better memory. 5. Avoid stressful situations. Live a simple life and avoid aggravations. Organize your time and prepare for the next day in anticipation. 6. Sleep well, avoid any interruptions of sleep and avoid any distractions in the bedroom that may interfere with adequate sleep quality 7. Avoid sugar, avoid sweets as there is a strong link between excessive sugar intake, diabetes, and cognitive impairment We discussed the Mediterranean diet, which has been shown to help patients reduce the risk of progressive memory disorders and reduces cardiovascular risk. This includes eating fish, eat fruits and green leafy vegetables, nuts like almonds and hazelnuts, walnuts, and also use olive oil. Avoid fast foods and fried foods as much as possible. Avoid sweets and sugar as sugar use has been linked to worsening of memory function.      Mediterranean Diet  Why follow it? Research shows. Those who follow the Mediterranean diet have a reduced risk of heart disease  The diet is associated with a reduced incidence of Parkinson's and Alzheimer's diseases People following the diet may have longer life expectancies and lower rates of chronic diseases  The Dietary Guidelines for Americans recommends the  Mediterranean diet as an eating plan to promote health and prevent disease  What Is the Mediterranean Diet?  Healthy eating plan based on typical foods and recipes of Mediterranean-style cooking The diet is primarily a plant based diet; these foods should make up a majority of meals   Starches - Plant based foods should make up a majority of meals - They are an important sources of vitamins, minerals, energy, antioxidants, and fiber - Choose whole grains, foods high in fiber and minimally processed items  - Typical grain sources include wheat, oats, barley, corn, brown rice, bulgar, farro, millet, polenta, couscous  - Various types of beans include chickpeas, lentils, fava beans, black beans, white beans   Fruits  Veggies - Large quantities of antioxidant rich fruits & veggies; 6 or more servings  - Vegetables can be eaten raw or lightly drizzled with oil and cooked  - Vegetables common to the traditional Mediterranean Diet include: artichokes, arugula, beets, broccoli, brussel sprouts, cabbage, carrots, celery, collard greens, cucumbers, eggplant, kale, leeks, lemons, lettuce, mushrooms, okra, onions, peas, peppers, potatoes, pumpkin, radishes, rutabaga, shallots, spinach, sweet potatoes, turnips, zucchini - Fruits common to the Mediterranean Diet include: apples, apricots, avocados, cherries, clementines, dates, figs, grapefruits, grapes, melons, nectarines, oranges, peaches, pears, pomegranates, strawberries, tangerines  Fats - Replace butter and margarine with healthy oils, such as olive oil, canola oil, and tahini  - Limit nuts to no more than a handful a day  - Nuts include walnuts, almonds, pecans, pistachios, pine nuts  - Limit or avoid candied, honey roasted or heavily salted nuts - Olives are central to the Mediterranean diet - can be eaten whole or used  in a variety of dishes   Meats Protein - Limiting red meat: no more than a few times a month - When eating red meat: choose lean cuts  and keep the portion to the size of deck of cards - Eggs: approx. 0 to 4 times a week  - Fish and lean poultry: at least 2 a week  - Healthy protein sources include, chicken, [place], lean beef, lamb - Increase intake of seafood such as tuna, salmon, trout, mackerel, shrimp, scallops - Avoid or limit high fat processed meats such as sausage and bacon  Dairy - Include moderate amounts of low fat dairy products  - Focus on healthy dairy such as fat free yogurt, skim milk, low or reduced fat cheese - Limit dairy products higher in fat such as whole or 2% milk, cheese, ice cream  Alcohol - Moderate amounts of red wine is ok  - No more than 5 oz daily for women (all ages) and men older than age 27  - No more than 10 oz of wine daily for men younger than 58  Other - Limit sweets and other desserts  - Use herbs and spices instead of salt to flavor foods  - Herbs and spices common to the traditional Mediterranean Diet include: basil, bay leaves, chives, cloves, cumin, fennel, garlic, lavender, marjoram, mint, oregano, parsley, pepper, rosemary, sage, savory, sumac, tarragon, thyme   It's not just a diet, it's a lifestyle:  The Mediterranean diet includes lifestyle factors typical of those in the region  Foods, drinks and meals are best eaten with others and savored Daily physical activity is important for overall good health This could be strenuous exercise like running and aerobics This could also be more leisurely activities such as walking, housework, yard-work, or taking the stairs Moderation is the key; a balanced and healthy diet accommodates most foods and drinks Consider portion sizes and frequency of consumption of certain foods   Meal Ideas & Options:  Breakfast:  Whole wheat toast or whole wheat English muffins with peanut butter & hard boiled egg Steel cut oats topped with apples & cinnamon and skim milk  Fresh fruit: banana, strawberries, melon, berries, peaches  Smoothies:  strawberries, bananas, greek yogurt, peanut butter Low fat greek yogurt with blueberries and granola  Egg white omelet with spinach and mushrooms Breakfast couscous: whole wheat couscous, apricots, skim milk, cranberries  Sandwiches:  Hummus and grilled vegetables (peppers, zucchini, squash) on whole wheat bread   Grilled chicken on whole wheat pita with lettuce, tomatoes, cucumbers or tzatziki  [place] salad on whole wheat bread: tuna salad made with greek yogurt, olives, red peppers, capers, green onions Garlic rosemary lamb pita: lamb sauted with garlic, rosemary, salt & pepper; add lettuce, cucumber, greek yogurt to pita - flavor with lemon juice and black pepper  Seafood:  Mediterranean grilled salmon, seasoned with garlic, basil, parsley, lemon juice and black pepper Shrimp, lemon, and spinach whole-grain pasta salad made with low fat greek yogurt  Seared scallops with lemon orzo  Seared tuna steaks seasoned salt, pepper, coriander topped with tomato mixture of olives, tomatoes, olive oil, minced garlic, parsley, green onions and cappers  Meats:  Herbed greek chicken salad with kalamata olives, cucumber, feta  Red bell peppers stuffed with spinach, bulgur, lean ground beef (or lentils) & topped with feta   Kebabs: skewers of chicken, tomatoes, onions, zucchini, squash  [place] burgers: made with red onions, mint, dill, lemon juice, feta cheese topped with roasted red peppers Vegetarian Cucumber  salad: cucumbers, artichoke hearts, celery, red onion, feta cheese, tossed in olive oil & lemon juice  Hummus and whole grain pita points with a greek salad (lettuce, tomato, feta, olives, cucumbers, red onion) Lentil soup with celery, carrots made with vegetable broth, garlic, salt and pepper  Tabouli salad: parsley, bulgur, mint, scallions, cucumbers, tomato, radishes, lemon juice, olive oil, salt and pepper.

## 2023-02-09 NOTE — Progress Notes (Signed)
NEUROLOGY FOLLOW UP OFFICE NOTE  [name] [id number] Nov 26, 1970  HISTORY OF PRESENT ILLNESS: I had the pleasure of seeing [name] in follow-up in the neurology clinic on 02/09/2023.  The patient was last seen 7 months ago for frequent falls and concern that they were seizures as she did not recall what occurs before or after a fall. She is again accompanied by her sister [name] who helps supplement the history today.  Records and images were personally reviewed where available.  She has had extensive testing with brain MRI with and without contrast in 06/2022 which was motion degraded but there were no abnormalities seen. She was hyperreflexic on initial exam. MRI cervical spine with and without contrast did not show any myelopathy. There was mild to moderate disc degeneration at C4-5 and C5-6, disc bulge mildly effaces the ventral thecal sac and contacts ventral aspect of spinal cord. Her 1-hour EEG in 05/2022 was normal, she then had a 26-hour EEG in 06/2022 which was also normal, typical events not captured. She continued to report frequent falls on last visit, we discussed doing inpatient video EEG monitoring to characterize events but she cancelled it in April. She called 2 weeks ago to report new symptoms and concern that she has Parkinson's disease.   She was feeling little pulses in different parts of her body and was started on Ingrezza which did help, however she then started having really bad back and forth jaw movements. Ingrezza was stopped and she was put on Cogentin 1mg  BID which has helped. She continues on Abilify 5mg  daily and Seroquel 400mg  daily. She is concerned she has Parkinson's disease because of the movements and forgetfulness. She reports short term and long term memory changes. She would be in the middle of a sentence and lose her train of thought. Medications come in piillpacks, she denies forgetting medications. She denies missing any bill payments or burning food on the  stove. She drives and denies getting lost but sometimes wonders where she is until she sees a familiar landmark. No family history of dementia, PD. No history of head injuries. They are happy to report that she has not had any falls since April, [name] notes this occurred when her medications were reduced.    History on Initial Assessment 05/14/2022: This is a 52 year old right-handed man with a history of hypertension, hypothyroidism, nephrolithiasis, CKD, anxiety, depression, presenting for frequent falls. She states she does not remember what occurs before or after a fall. Symptoms started 6-8 months ago, last fall was 2 weeks ago. She lives with her father. Her father mentioned she got up one night to use the bathroom and fell, she was awake but not alert. She does not recall going to the bathroom or him helping her up. Another time 3-4 months ago, she was out of it and slurring her speech. She had fallen several times, getting up to use the bathroom and fall, "awake but not really awake," occurring in the daytime as well. She was initially falling 4 times in a week so [name] stayed with her for 2 weeks, then all of a sudden, she was back to her old self. Symptoms then recurrent for 1-2 weeks where [name] and their aunt stayed to care for her. Speech was slurred so bad, she could not understand people around her but [name] reports she would follow instructions. One time she had urinary incontinence. [name] notes she gets really irritable during those periods then would not remember what  she said. She reports her memory is bad in general. Over the past year, she denies getting lost driving but would get disoriented for a minute then realize where she is. She denies missing medications or bill payments but forgets to make appointments. She reports it takes a lot of effort to do what she needs to, such as paperwork. Her handwriting has also changed, it starts good then goes off the line.   They deny any  staring/unresponsive episodes. She denies any olfactory/gustatory hallucinations. She has numbness and burning on the left lateral thigh. It hurts to lift her left arm, no numbness/tingling. Her left hip also bothers her. She states the pain is not from prior falls. Her feet feel cold but they are warm when touched. She has brief body twitches and developed tics which Ingrezza sometimes helps with. She denies any headaches, neck/back pain, dysarthria/dysphagia. She has a lot of dizziness. She was having a little double vision during the months she had the episodes. She has some constipation but also has times she cannot hold her BM and has incontinence. [name] would help her get cleaned up but she would not recall how she got cleaned. She usually gets 5 hours of sleep and denies any change in sleep pattern or alcohol intake during the episodes. Mood is okay, she gets anxious and cold. She takes Xanax every night with no missed doses. She is also on Lamotrigine 200mg  BID for mood. No family history of similar symptoms. She had a normal birth and early development.  There is no history of febrile convulsions, CNS infections such as meningitis/encephalitis, significant traumatic brain injury, neurosurgical procedures, or family history of seizures.   PAST MEDICAL HISTORY: Past Medical History:  Diagnosis Date   Anxiety    Chronic kidney disease    CKD (chronic kidney disease) stage 3, GFR 30-59 ml/min (HCC) 03/15/2019   Depression    Diarrhea 05/04/2017   Essential hypertension, benign 03/15/2019   History of kidney stones    Hypertension    Hypothyroidism    Hypothyroidism, adult 03/15/2019   Kidney stones    LGSIL on Pap smear of cervix 08/17/2020   Needs colpo per [facility] immediate risk is 4.3 for CIN 3+risk    Obesity (BMI 30-39.9) 03/15/2019   Renal disorder    Vitamin D deficiency disease 03/15/2019    MEDICATIONS: Current Outpatient Medications on File Prior to Visit  Medication Sig  Dispense Refill   alfuzosin (UROXATRAL) 10 MG 24 hr tablet Take 1 tablet (10 mg total) by mouth daily with breakfast. (Patient not taking: Reported on 10/21/2022) 30 tablet 0   ALPRAZolam (XANAX) 1 MG tablet Take 1 mg by mouth 3 (three) times daily as needed for anxiety.      amLODipine (NORVASC) 5 MG tablet Take 1 tablet (5 mg total) by mouth daily. 30 tablet 5   ARIPiprazole (ABILIFY) 5 MG tablet Take 5 mg by mouth at bedtime.     atorvastatin (LIPITOR) 20 MG tablet Take 1 tablet (20 mg total) by mouth daily. 90 tablet 3   carvedilol (COREG) 25 MG tablet TAKE 1 TABLET BY MOUTH TWICE DAILY 180 tablet 2   cephALEXin (KEFLEX) 500 MG capsule Take 500 mg by mouth 3 (three) times daily. (Patient not taking: Reported on 10/21/2022)     chlorthalidone (HYGROTON) 25 MG tablet Take 12.5 mg by mouth daily.     Cholecalciferol (VITAMIN D3) 125 MCG (5000 UT) TABS Take 10,000 Units by mouth daily.  doxepin (SINEQUAN) 75 MG capsule Take 225 mg by mouth at bedtime.      INGREZZA 60 MG capsule Take 60 mg by mouth at bedtime.     lamoTRIgine (LAMICTAL) 200 MG tablet Take 200 mg by mouth 2 (two) times daily.     losartan (COZAAR) 25 MG tablet Take 25 mg by mouth in the morning.     nitrofurantoin (MACRODANTIN) 50 MG capsule TAKE ONE CAPSULE BY MOUTH AT BEDTIME 30 capsule 11   ondansetron (ZOFRAN) 4 MG tablet Take 1 tablet (4 mg total) by mouth daily as needed for nausea or vomiting. (Patient not taking: Reported on 10/21/2022) 30 tablet 1   oxyCODONE-acetaminophen (PERCOCET) 10-325 MG tablet TAKE 1 TABLET BY MOUTH EVERY 6 HOURS AS NEEDED FOR PAIN (Patient not taking: Reported on 10/21/2022) 30 tablet 0   potassium citrate (UROCIT-K) 10 MEQ (1080 MG) SR tablet Take 20 mEq by mouth in the morning and at bedtime.     QUEtiapine (SEROQUEL XR) 400 MG 24 hr tablet Take 400 mg by mouth at bedtime.      No current facility-administered medications on file prior to visit.    ALLERGIES: Allergies  Allergen Reactions    Morphine And Codeine Itching and Hives   Sulfa Antibiotics Swelling   Tape Itching and Other (See Comments)    Surgical     FAMILY HISTORY: Family History  Problem Relation Age of Onset   Cancer Paternal Grandmother    Heart failure Paternal Grandmother    Cancer Maternal Grandmother    Cancer Father    Heart failure Father    Lung cancer Mother    Colon cancer Neg Hx     SOCIAL HISTORY: Social History   Socioeconomic History   Marital status: Divorced    Spouse name: Not on file   Number of children: Not on file   Years of education: Not on file   Highest education level: Not on file  Occupational History   Not on file  Tobacco Use   Smoking status: Former    Current packs/day: 0.00    Average packs/day: 0.5 packs/day for 20.0 years (10.0 ttl pk-yrs)    Types: Cigarettes    Start date: 02/08/2002    Quit date: 02/08/2022    Years since quitting: 1.0   Smokeless tobacco: Never  Vaping Use   Vaping status: Never Used  Substance and Sexual Activity   Alcohol use: No   Drug use: No   Sexual activity: Not Currently    Birth control/protection: Injection  Other Topics Concern   Not on file  Social History Narrative   Divorced twice,1st lasted 5 years,2nd 4 years.On disabilty secondary to mental illness since 2004.Previously used to [employer].Lives alone,has 2 cats.cright handed    Are you right handed or left handed? right   Are you currently employed ? no   What is your current occupation?   Do you live at home alone?no   Who lives with you? dad   What type of home do you live in: 1 story or 2 story? one    Caffeine 2 cans of soda   Social Determinants of Health   Financial Resource Strain: Low Risk  (07/21/2022)   Overall Financial Resource Strain (CARDIA)    Difficulty of Paying Living Expenses: Not very hard  Food Insecurity: No Food Insecurity (08/07/2022)   Hunger Vital Sign    Worried About Running Out of Food in the Last Year: Never true  Ran Out of Food in the Last Year: Never true  Transportation Needs: No Transportation Needs (08/07/2022)   PRAPARE - Administrator, Civil Service (Medical): No    Lack of Transportation (Non-Medical): No  Physical Activity: Insufficiently Active (08/06/2020)   Exercise Vital Sign    Days of Exercise per Week: 2 days    Minutes of Exercise per Session: 10 min  Stress: No Stress Concern Present (07/21/2022)   [employer] of Occupational Health - Occupational Stress Questionnaire    Feeling of Stress : Only a little  Social Connections: Socially Isolated (07/21/2022)   Social Connection and Isolation Panel [NHANES]    Frequency of Communication with Friends and Family: More than three times a week    Frequency of Social Gatherings with Friends and Family: Three times a week    Attends Religious Services: Never    Active Member of Clubs or Organizations: No    Attends Banker Meetings: Never    Marital Status: Divorced  Catering manager Violence: Not At Risk (08/07/2022)   Humiliation, Afraid, Rape, and Kick questionnaire    Fear of Current or Ex-Partner: No    Emotionally Abused: No    Physically Abused: No    Sexually Abused: No     PHYSICAL EXAM: Vitals:   02/09/23 1255  BP: 121/70  Pulse: 87  SpO2: 97%   General: No acute distress Head:  Normocephalic/atraumatic Skin/Extremities: No rash, no edema Neurological Exam: alert and awake. No aphasia or dysarthria. Fund of knowledge is appropriate. Attention and concentration are normal.   Cranial nerves: Pupils equal, round. Extraocular movements intact with no nystagmus. Visual fields full.  No facial asymmetry.  Motor: Bulk and tone normal, no cogwheeling, muscle strength 5/5 throughout with no pronator drift.   Finger to nose testing intact.  Gait narrow-based and steady, able to tandem walk adequately.  Romberg negative.  Movement examination: Tone: There is normal tone Abnormal movements:  none Coordination:  There is no decremation with RAM's, with any form of RAMS, including alternating supination and pronation of the forearm, hand opening and closing, finger taps, toe taps Gait and Station: The patient has no difficulty arising out of a deep-seated chair without the use of the hands. The patient's stride length is normal, no shuffling.  Good arm swing. The patient has a negative pull test.    IMPRESSION: This is a 52 yo RH woman with a history of hypertension, hypothyroidism, nephrolithiasis, CKD, anxiety, depression, with frequent falls where she has no recollection of events. MRI brain unremarkable, MRI cervical spine no myelopathy seen. Her routine and 26-hour EEG were normal. She has not had any further falls since April 2024 when medications were reduced. She is concerned about Parkinson's disease due to abnormal jaw movements and memory loss. There are no abnormal movements on exam today, no parkinsonian signs. She reports abnormal jaw movements stopped with stopping Ingrezza and starting Cogentin. The patient was reassured that there is no indication of Parkinson's disease today. We discussed different causes of memory loss, I suspect a lot is still due to her psychiatric medications or underlying psychiatric disorder (depression/anxiety), she will speak to her Psychiatrist as to how much medication reduction can be done with continued control of symptoms. If no change in cognition, we may consider Neuropsychological testing. Check TSH and B12. We discussed the importance of control of vascular risk factors, physical exercise, MIND diet for overall brain health. Follow-up in 4-5 months, call for any changes.  Thank you for allowing me to participate in her care.  Please do not hesitate to call for any questions or concerns.    Patrcia Dolly, M.D.   CC: Dr. Allena Katz

## 2023-02-10 LAB — VITAMIN B12: Vitamin B-12: 594 pg/mL (ref 211–911)

## 2023-02-10 LAB — TSH: TSH: 2.27 u[IU]/mL (ref 0.35–5.50)

## 2023-02-11 ENCOUNTER — Telehealth: Payer: Self-pay

## 2023-02-11 NOTE — Telephone Encounter (Signed)
-----   Message from Van Clines sent at 02/10/2023  2:03 PM EDT ----- Pls let her know thyroid and B12 normal, thanks

## 2023-02-11 NOTE — Telephone Encounter (Signed)
Pt called an informed that her thyroid and B12 normal,

## 2023-02-22 NOTE — Patient Instructions (Signed)
Visit Information  Thank you for taking time to visit with me today. Please don't hesitate to contact me if I can be of assistance to you.   Following are the goals we discussed today:   Goals Addressed               This Visit's Progress     Patient Stated     Decrease Falls, pain, improve sleep (RN CM) (pt-stated)   Not on track     Interventions Today    Flowsheet Row Most Recent Value  Chronic Disease   Chronic disease during today's visit Hypertension (HTN), Chronic Kidney Disease/End Stage Renal Disease (ESRD), Other  [recent ED visit for fall, hospitaliation to remove kidney stone/CKD, hypotension-managing medicines to prevent, vasovagal stimulation, coping,]  General Interventions   General Interventions Discussed/Reviewed General Interventions Reviewed, General Interventions Discussed, Community Resources  Doctor Visits Discussed/Reviewed Doctor Visits Reviewed, Specialist  PCP/Specialist Visits Compliance with follow-up visit  Education Interventions   Education Provided Provided Education  [hypotensin, holding BP medicines when blood pressure is low, vasovagal/constipation]  Mental Health Interventions   Mental Health Discussed/Reviewed Coping Strategies, Mental Health Reviewed            COMPLETED: improve sleep (THN) (pt-stated)        Duplicate RN CM goal closure      COMPLETED: manage pain on left side (THN) (pt-stated)        Duplicate RN CM goal closure        Our next appointment is by telephone on 03/03/23 at 1000  Please call the care guide team at [phone] if you need to cancel or reschedule your appointment.   If you are experiencing a Mental Health or Behavioral Health Crisis or need someone to talk to, please call the Suicide and Crisis Lifeline: 988 call the [place] National Suicide Prevention Lifeline: (684) 437-1852 or TTY: (475)801-1163 TTY 651-471-9251) to talk to a trained counselor call 1-800-273-TALK (toll free, 24 hour hotline) call  the [facility]: [phone] call 911   Patient verbalizes understanding of instructions and care plan provided today and agrees to view in MyChart. Active MyChart status and patient understanding of how to access instructions and care plan via MyChart confirmed with patient.     The patient has been provided with contact information for the care management team and has been advised to call with any health related questions or concerns.   Aashka Salomone L. Noelle Penner, RN, BSN, CCM, Care Management Coordinator [phone]

## 2023-03-03 ENCOUNTER — Ambulatory Visit: Payer: Self-pay | Admitting: *Deleted

## 2023-03-03 NOTE — Patient Outreach (Signed)
Care Coordination   Follow Up Visit Note   03/03/2023 Name: [name] MRN: [id number] DOB: [birth year]  [name] is a 52 y.o. year old female who sees Anabel Halon, MD for primary care. I spoke with  Sabrina Blower Moncayo by phone today.  What matters to the patients health and wellness today?  Review of medicines, review of appointments, ? Urology office visit needed  Since March 2024 patient needs to have ultrasound completed before returning to Urology Dr Ronne Binning She can call central surgery to schedule this at [phone]. She can have this completed at any [facility] imaging facility of her choice    Goals Addressed               This Visit's Progress     Patient Stated     complete renal ultrasound and meake appointment to urology (RN CM) (pt-stated)        Interventions Today    Flowsheet Row Most Recent Value  Chronic Disease   Chronic disease during today's visit Other  [MD visits, missed reanl ultrasound - needs scheduling patient provide [phone] to schedule]  General Interventions   General Interventions Discussed/Reviewed General Interventions Reviewed, [employer], Health Screening, Doctor Visits, Communication with  Doctor Visits Discussed/Reviewed Doctor Visits Reviewed, PCP, Specialist  Health Screening --  [renal ultrasound needed prior to seeing Dr Ronne Binning again]  PCP/Specialist Visits Compliance with follow-up visit  [encouraged to attend all appointments]  Communication with PCP/Specialists  [spoke with Carney Bern at Urology office to clarify what patient need before an urology appointment can be made]  Education Interventions   Education Provided --  [Insurance review]  Provided Verbal Education On When to see the doctor, Transport planner of follow up appointments]  Pharmacy Interventions   Pharmacy Dicussed/Reviewed Pharmacy Topics Reviewed, Medications and their functions, Affording Medications              SDOH assessments  and interventions completed:  No     Care Coordination Interventions:  Yes, provided   Follow up plan: Follow up call scheduled for 04/03/23    Encounter Outcome:  Patient Visit Completed   [name], RN, BSN, CCM, Care Management Coordinator [phone]

## 2023-03-03 NOTE — Patient Instructions (Signed)
Visit Information  Thank you for taking time to visit with me today. Please don't hesitate to contact me if I can be of assistance to you.   Following are the goals we discussed today:   Goals Addressed               This Visit's Progress     Patient Stated     complete renal ultrasound and meake appointment to urology (RN CM) (pt-stated)        Interventions Today    Flowsheet Row Most Recent Value  Chronic Disease   Chronic disease during today's visit Other  [MD visits, missed reanl ultrasound - needs scheduling patient provide [phone] to schedule]  General Interventions   General Interventions Discussed/Reviewed General Interventions Reviewed, [employer], Health Screening, Doctor Visits, Communication with  Doctor Visits Discussed/Reviewed Doctor Visits Reviewed, PCP, Specialist  Health Screening --  [renal ultrasound needed prior to seeing Dr Ronne Binning again]  PCP/Specialist Visits Compliance with follow-up visit  [encouraged to attend all appointments]  Communication with PCP/Specialists  [spoke with Carney Bern at Urology office to clarify what patient need before an urology appointment can be made]  Education Interventions   Education Provided --  [Insurance review]  Provided Verbal Education On When to see the doctor, Insurance Plans  [place] of follow up appointments]  Pharmacy Interventions   Pharmacy Dicussed/Reviewed Pharmacy Topics Reviewed, Medications and their functions, Affording Medications              Our next appointment is by telephone on 04/03/23 at 1000  Please call the care guide team at [phone] if you need to cancel or reschedule your appointment.   If you are experiencing a Mental Health or Behavioral Health Crisis or need someone to talk to, please call the Suicide and Crisis Lifeline: 988 call the [place] National Suicide Prevention Lifeline: (579)344-0579 or TTY: 850-218-8776 TTY 8635564110) to talk to a trained  counselor call 1-800-273-TALK (toll free, 24 hour hotline) call the [facility]: [phone] call 911   Patient verbalizes understanding of instructions and care plan provided today and agrees to view in MyChart. Active MyChart status and patient understanding of how to access instructions and care plan via MyChart confirmed with patient.     The patient has been provided with contact information for the care management team and has been advised to call with any health related questions or concerns.   Jori Frerichs L. Noelle Penner, RN, BSN, CCM, Care Management Coordinator [phone]

## 2023-03-10 ENCOUNTER — Encounter: Payer: Self-pay | Admitting: Internal Medicine

## 2023-03-10 ENCOUNTER — Ambulatory Visit (INDEPENDENT_AMBULATORY_CARE_PROVIDER_SITE_OTHER): Payer: Medicare HMO | Admitting: Internal Medicine

## 2023-03-10 VITALS — BP 117/72 | HR 93 | Ht 63.0 in | Wt 219.4 lb

## 2023-03-10 DIAGNOSIS — F3170 Bipolar disorder, currently in remission, most recent episode unspecified: Secondary | ICD-10-CM | POA: Diagnosis not present

## 2023-03-10 DIAGNOSIS — I10 Essential (primary) hypertension: Secondary | ICD-10-CM

## 2023-03-10 DIAGNOSIS — Z23 Encounter for immunization: Secondary | ICD-10-CM | POA: Diagnosis not present

## 2023-03-10 DIAGNOSIS — N1832 Chronic kidney disease, stage 3b: Secondary | ICD-10-CM | POA: Diagnosis not present

## 2023-03-10 DIAGNOSIS — G2401 Drug induced subacute dyskinesia: Secondary | ICD-10-CM | POA: Diagnosis not present

## 2023-03-10 NOTE — Assessment & Plan Note (Signed)
Followed by psychiatry On Abilify, doxepin, Lamictal and Seroquel Takes Xanax as needed for anxiety She is on multiple sedative medications, which can also cause falls - advised to discuss with Psychiatrist to decrease dose of Seroquel or Doxepin if appropriate and discontinue Abilify if appropriate as she already takes Seroquel

## 2023-03-10 NOTE — Assessment & Plan Note (Signed)
Likely due to antipsychotics-on Abilify and Seroquel Needs to discuss with psychiatrist about need of Abilify and to decrease dose of Seroquel She is already on Cogentin - symptoms improved now

## 2023-03-10 NOTE — Assessment & Plan Note (Addendum)
BP Readings from Last 1 Encounters:  03/10/23 117/72   Well-controlled with amlodipine, Losartan, Coreg and chlorthalidone Followed by Nephrology Episodes of dizziness could be due to dehydration and orthostatic hypotension - improved since discontinuing clonidine in the last visit DC chlorthalidone 12.5 mg once daily due to concern for dehydration Needs to improve hydration Counseled for compliance with the medications Advised DASH diet and moderate exercise/walking, at least 150 mins/week

## 2023-03-10 NOTE — Patient Instructions (Addendum)
Please stop taking Chlorthalidone.  Please continue to take other medications as prescribed.  Please continue to follow low carb diet and perform moderate exercise/walking at least 150 mins/week.

## 2023-03-10 NOTE — Progress Notes (Signed)
Established Patient Office Visit  Subjective:  Patient ID: [name], female    DOB: 1970/11/27  Age: 52 y.o. MRN: [id number]  CC:  Chief Complaint  Patient presents with   Hypertension    Follow up    Chronic Kidney Disease    Follow up    Memory Loss    Patient states memory issues    HPI [name] is a 52 y.o. female with past medical history of HTN, CKD stage III, bipolar disorder and nephrolithiasis who presents for f/u of her chronic medical conditions.  HTN: BP was well controlled today.  She takes amlodipine, losartan, chlorthalidone and carvedilol currently.  She has seen improvement in dizziness since stopping clonidine, but still has intermittent dizziness.  Denies any headache, chest pain or palpitations.  Tardive dyskinesia: She had constant twitching of the mouth and the lower jaw.  Of note, she is currently on Abilify 5 mg nightly and Seroquel 400 mg nightly.  She is benztropine instead of Ingrezza now for extrapyramidal side effects of antipsychotics, which has improved her symptoms.  She has seen neurology for it.  She also reports memory concerns, which are likely due to her psychiatric medicines.  Recurrent falls: She has h/o recurrent falls, but frequency has decreased now.  Does not report any fall since since the last visit.  She has seen neurologist, and has had MRI of the brain and cervical spine, which were unremarkable to suggest any specific etiology for falls.  She reports that she has been having LOC for few seconds before the fall and remains confused after the fall as well.  Denies any episode of shaking.  She also had EEG, which was also unremarkable.  She reports chronic leg weakness.   CKD:  Followed by Nephrology.  Denies any dysuria, hematuria or urinary hesitancy or resistance.   Recurrent nephrolithiasis: She had nephrolithotomy in 03/24. She currently denies any dysuria, hematuria, flank pain, fever, chills or nausea/vomiting.    Past  Medical History:  Diagnosis Date   Anxiety    Chronic kidney disease    CKD (chronic kidney disease) stage 3, GFR 30-59 ml/min (HCC) 03/15/2019   Depression    Diarrhea 05/04/2017   Essential hypertension, benign 03/15/2019   History of kidney stones    Hypertension    Hypothyroidism    Hypothyroidism, adult 03/15/2019   Kidney stones    LGSIL on Pap smear of cervix 08/17/2020   Needs colpo per [facility] immediate risk is 4.3 for CIN 3+risk    Obesity (BMI 30-39.9) 03/15/2019   Renal disorder    Vitamin D deficiency disease 03/15/2019    Past Surgical History:  Procedure Laterality Date   BIOPSY  06/05/2017   Procedure: BIOPSY;  Surgeon: Malissa Hippo, MD;  [facility];  Service: Endoscopy;;  colon   CHOLECYSTECTOMY     COLONOSCOPY N/A 06/05/2017   Procedure: COLONOSCOPY;  Surgeon: Malissa Hippo, MD;  Location: [facility];  Service: Endoscopy;  Laterality: N/A;  8:30   CYSTOSCOPY WITH RETROGRADE PYELOGRAM, URETEROSCOPY AND STENT PLACEMENT Left 10/04/2020   Procedure: CYSTOSCOPY WITH LEFT RETROGRADE PYELOGRAM, DIAGNOSTIC LEFT URETEROSCOPY AND LEFT URETEREAL STENT PLACEMENT;  Surgeon: Malen Gauze, MD;  Location: [facility];  Service: Urology;  Laterality: Left;   CYSTOSCOPY WITH RETROGRADE PYELOGRAM, URETEROSCOPY AND STENT PLACEMENT Left 10/16/2020   Procedure: CYSTOSCOPY WITH LEFT RETROGRADE PYELOGRAM, LEFT URETEROSCOPY  WITH LASER AND LEFT URETERAL STENT EXCHANGE;  Surgeon: Malen Gauze, MD;  Location: [facility];  Service: Urology;  Laterality: Left;   CYSTOSCOPY WITH RETROGRADE PYELOGRAM, URETEROSCOPY AND STENT PLACEMENT Bilateral 02/17/2022   Procedure: CYSTOSCOPY WITH RETROGRADE PYELOGRAM, URETEROSCOPY AND STENT PLACEMENT;  Surgeon: Malen Gauze, MD;  Location: [facility];  Service: Urology;  Laterality: Bilateral;   CYSTOSCOPY WITH RETROGRADE PYELOGRAM, URETEROSCOPY AND STENT PLACEMENT Left 06/26/2022   Procedure: CYSTOSCOPY WITH RETROGRADE PYELOGRAM,  URETEROSCOPY AND STENT PLACEMENT;  Surgeon: Malen Gauze, MD;  Location: [facility];  Service: Urology;  Laterality: Left;   HOLMIUM LASER APPLICATION Left 10/16/2020   Procedure: HOLMIUM LASER APPLICATION;  Surgeon: Malen Gauze, MD;  Location: [facility];  Service: Urology;  Laterality: Left;   HOLMIUM LASER APPLICATION Left 06/26/2022   Procedure: HOLMIUM LASER APPLICATION;  Surgeon: Malen Gauze, MD;  Location: [facility];  Service: Urology;  Laterality: Left;   IR  NEPHROURETERAL CATH PLACE LEFT  08/05/2022   KIDNEY STONE SURGERY     kidney stones     LASER ABLATION CONDOLAMATA N/A 10/31/2020   Procedure: LASER ABLATION OF THE CERVIX;  Surgeon: Lazaro Arms, MD;  Location: [facility];  Service: Gynecology;  Laterality: N/A;   NEPHROLITHOTOMY Left 08/07/2022   Procedure: NEPHROLITHOTOMY PERCUTANEOUS;  Surgeon: Malen Gauze, MD;  Location: [facility];  Service: Urology;  Laterality: Left;    Family History  Problem Relation Age of Onset   Cancer Paternal Grandmother    Heart failure Paternal Grandmother    Cancer Maternal Grandmother    Cancer Father    Heart failure Father    Lung cancer Mother    Colon cancer Neg Hx     Social History   Socioeconomic History   Marital status: Divorced    Spouse name: Not on file   Number of children: Not on file   Years of education: Not on file   Highest education level: Not on file  Occupational History   Not on file  Tobacco Use   Smoking status: Every Day    Current packs/day: 0.00    Average packs/day: 0.5 packs/day for 20.0 years (10.0 ttl pk-yrs)    Types: Cigarettes    Start date: 02/08/2002    Last attempt to quit: 02/08/2022    Years since quitting: 1.0   Smokeless tobacco: Never  Vaping Use   Vaping status: Never Used  Substance and Sexual Activity   Alcohol use: No   Drug use: No   Sexual activity: Not Currently    Birth control/protection: Injection  Other Topics Concern   Not on file  Social History Narrative    Divorced twice,1st lasted 5 years,2nd 4 years.On disabilty secondary to mental illness since 2004.Previously used to [employer].Lives alone,has 2 cats.cright handed    Are you right handed or left handed? right   Are you currently employed ? no   What is your current occupation?   Do you live at home alone?no   Who lives with you? dad   What type of home do you live in: 1 story or 2 story? one    Caffeine 2 cans of soda   Social Determinants of Health   Financial Resource Strain: Low Risk  (07/21/2022)   Overall Financial Resource Strain (CARDIA)    Difficulty of Paying Living Expenses: Not very hard  Food Insecurity: No Food Insecurity (08/07/2022)   Hunger Vital Sign    Worried About Running Out of Food in the Last Year: Never true    Ran Out of Food  in the Last Year: Never true  Transportation Needs: No Transportation Needs (08/07/2022)   PRAPARE - Administrator, Civil Service (Medical): No    Lack of Transportation (Non-Medical): No  Physical Activity: Insufficiently Active (08/06/2020)   Exercise Vital Sign    Days of Exercise per Week: 2 days    Minutes of Exercise per Session: 10 min  Stress: No Stress Concern Present (07/21/2022)   [employer] of Occupational Health - Occupational Stress Questionnaire    Feeling of Stress : Only a little  Social Connections: Socially Isolated (07/21/2022)   Social Connection and Isolation Panel [NHANES]    Frequency of Communication with Friends and Family: More than three times a week    Frequency of Social Gatherings with Friends and Family: Three times a week    Attends Religious Services: Never    Active Member of Clubs or Organizations: No    Attends Banker Meetings: Never    Marital Status: Divorced  Catering manager Violence: Not At Risk (08/07/2022)   Humiliation, Afraid, Rape, and Kick questionnaire    Fear of Current or Ex-Partner: No    Emotionally Abused: No    Physically Abused: No     Sexually Abused: No    Outpatient Medications Prior to Visit  Medication Sig Dispense Refill   ALPRAZolam (XANAX) 1 MG tablet Take 1 mg by mouth 3 (three) times daily as needed for anxiety.      amLODipine (NORVASC) 5 MG tablet Take 1 tablet (5 mg total) by mouth daily. 30 tablet 5   ARIPiprazole (ABILIFY) 5 MG tablet Take 5 mg by mouth at bedtime.     atorvastatin (LIPITOR) 20 MG tablet Take 1 tablet (20 mg total) by mouth daily. 90 tablet 3   benztropine (COGENTIN) 1 MG tablet Take 1 mg by mouth 2 (two) times daily.     carvedilol (COREG) 25 MG tablet TAKE 1 TABLET BY MOUTH TWICE DAILY 180 tablet 2   Cholecalciferol (VITAMIN D3) 125 MCG (5000 UT) TABS Take 10,000 Units by mouth daily. (Patient not taking: Reported on 02/09/2023)     doxepin (SINEQUAN) 75 MG capsule Take 225 mg by mouth at bedtime.      lamoTRIgine (LAMICTAL) 200 MG tablet Take 200 mg by mouth 2 (two) times daily.     losartan (COZAAR) 25 MG tablet Take 25 mg by mouth in the morning.     nitrofurantoin (MACRODANTIN) 50 MG capsule TAKE ONE CAPSULE BY MOUTH AT BEDTIME 30 capsule 11   potassium citrate (UROCIT-K) 10 MEQ (1080 MG) SR tablet Take 20 mEq by mouth in the morning and at bedtime.     QUEtiapine (SEROQUEL XR) 400 MG 24 hr tablet Take 400 mg by mouth at bedtime.      chlorthalidone (HYGROTON) 25 MG tablet Take 12.5 mg by mouth daily.     No facility-administered medications prior to visit.    Allergies  Allergen Reactions   Morphine And Codeine Itching and Hives   Sulfa Antibiotics Swelling   Tape Itching and Other (See Comments)    Surgical     ROS Review of Systems  Constitutional:  Negative for chills and fever.  HENT:  Negative for congestion, sinus pressure, sinus pain and sore throat.   Eyes:  Negative for pain and discharge.  Respiratory:  Negative for cough and shortness of breath.   Cardiovascular:  Negative for chest pain and palpitations.  Gastrointestinal:  Negative for abdominal pain, diarrhea,  nausea and  vomiting.  Endocrine: Negative for polydipsia and polyuria.  Genitourinary:  Negative for dysuria and hematuria.  Musculoskeletal:  Negative for neck pain and neck stiffness.  Skin:  Negative for rash.  Neurological:  Positive for dizziness. Negative for weakness.  Psychiatric/Behavioral:  Positive for confusion and dysphoric mood. Negative for agitation and behavioral problems. The patient is nervous/anxious.       Objective:    Physical Exam Vitals reviewed.  Constitutional:      General: She is not in acute distress.    Appearance: She is not diaphoretic.  HENT:     Head: Normocephalic and atraumatic.     Nose: Nose normal.     Mouth/Throat:     Mouth: Mucous membranes are moist.  Eyes:     General: No scleral icterus.    Extraocular Movements: Extraocular movements intact.  Cardiovascular:     Rate and Rhythm: Normal rate and regular rhythm.     Pulses: Normal pulses.     Heart sounds: Normal heart sounds. No murmur heard. Pulmonary:     Breath sounds: Normal breath sounds. No wheezing or rales.  Musculoskeletal:     Cervical back: Neck supple. No tenderness.     Right lower leg: No edema.     Left lower leg: No edema.  Skin:    General: Skin is warm.     Findings: No rash.  Neurological:     General: No focal deficit present.     Mental Status: She is alert and oriented to person, place, and time.     Sensory: No sensory deficit.     Motor: Weakness (B/l LE - 4/5) present.  Psychiatric:        Mood and Affect: Mood normal.        Behavior: Behavior normal.     BP 117/72 (BP Location: Left Arm, Patient Position: Sitting, Cuff Size: Large)   Pulse 93   Ht 5\' 3"  (1.6 m)   Wt 219 lb 6.4 oz (99.5 kg)   SpO2 92%   BMI 38.86 kg/m  Wt Readings from Last 3 Encounters:  03/10/23 219 lb 6.4 oz (99.5 kg)  02/09/23 212 lb 9.6 oz (96.4 kg)  11/18/22 191 lb 3.2 oz (86.7 kg)    Lab Results  Component Value Date   TSH 2.27 02/09/2023   Lab Results   Component Value Date   WBC 8.8 08/11/2022   HGB 12.8 08/11/2022   HCT 38.9 08/11/2022   MCV 91.7 08/11/2022   PLT 324 08/11/2022   Lab Results  Component Value Date   NA 139 08/11/2022   K 3.3 (L) 08/11/2022   CO2 26 08/11/2022   GLUCOSE 82 08/11/2022   BUN 16 08/11/2022   CREATININE 1.46 (H) 08/11/2022   BILITOT 0.6 08/11/2022   ALKPHOS 146 (H) 08/11/2022   AST 28 08/11/2022   ALT 23 08/11/2022   PROT 7.1 08/11/2022   ALBUMIN 3.6 08/11/2022   CALCIUM 9.1 08/11/2022   ANIONGAP 12 08/11/2022   Lab Results  Component Value Date   CHOL 244 (H) 09/04/2021   Lab Results  Component Value Date   HDL 44 (L) 09/04/2021   Lab Results  Component Value Date   LDLCALC 161 (H) 09/04/2021   Lab Results  Component Value Date   TRIG 218 (H) 09/04/2021   Lab Results  Component Value Date   CHOLHDL 5.5 (H) 09/04/2021   Lab Results  Component Value Date   HGBA1C 5.2 09/04/2021  Assessment & Plan:   Problem List Items Addressed This Visit       Cardiovascular and Mediastinum   Essential hypertension    BP Readings from Last 1 Encounters:  03/10/23 117/72   Well-controlled with amlodipine, Losartan, Coreg and chlorthalidone Followed by Nephrology Episodes of dizziness could be due to dehydration and orthostatic hypotension - improved since discontinuing clonidine in the last visit DC chlorthalidone 12.5 mg once daily due to concern for dehydration Needs to improve hydration Counseled for compliance with the medications Advised DASH diet and moderate exercise/walking, at least 150 mins/week        Nervous and Auditory   Tardive dyskinesia    Likely due to antipsychotics-on Abilify and Seroquel Needs to discuss with psychiatrist about need of Abilify and to decrease dose of Seroquel She is already on Cogentin - symptoms improved now        Genitourinary   CKD (chronic kidney disease) stage 3, GFR 30-59 ml/min (HCC) - Primary    Likely due to uncontrolled  HTN in the past Followed by Nephrology - last visit note reviewed On Losartan DC Chlorthalidone due to concern for orthostatic hypotension/ dehydration Avoid nephrotoxic agents        Other   Bipolar disorder in partial remission (HCC)    Followed by psychiatry On Abilify, doxepin, Lamictal and Seroquel Takes Xanax as needed for anxiety She is on multiple sedative medications, which can also cause falls - advised to discuss with Psychiatrist to decrease dose of Seroquel or Doxepin if appropriate and discontinue Abilify if appropriate as she already takes Seroquel      Other Visit Diagnoses     Encounter for immunization       Relevant Orders   Flu vaccine trivalent PF, 6mos and older(Flulaval,Afluria,Fluarix,Fluzone) (Completed)        No orders of the defined types were placed in this encounter.   Follow-up: Return in about 4 months (around 07/11/2023) for HTN and CKD.    Anabel Halon, MD

## 2023-03-10 NOTE — Assessment & Plan Note (Addendum)
Likely due to uncontrolled HTN in the past Followed by Nephrology - last visit note reviewed On Losartan DC Chlorthalidone due to concern for orthostatic hypotension/ dehydration Avoid nephrotoxic agents

## 2023-03-11 DIAGNOSIS — F4011 Social phobia, generalized: Secondary | ICD-10-CM | POA: Diagnosis not present

## 2023-03-11 DIAGNOSIS — F3132 Bipolar disorder, current episode depressed, moderate: Secondary | ICD-10-CM | POA: Diagnosis not present

## 2023-03-11 DIAGNOSIS — G2401 Drug induced subacute dyskinesia: Secondary | ICD-10-CM | POA: Diagnosis not present

## 2023-03-18 ENCOUNTER — Other Ambulatory Visit: Payer: Self-pay | Admitting: Internal Medicine

## 2023-03-18 DIAGNOSIS — E782 Mixed hyperlipidemia: Secondary | ICD-10-CM

## 2023-03-31 ENCOUNTER — Telehealth: Payer: Self-pay | Admitting: Neurology

## 2023-03-31 NOTE — Telephone Encounter (Signed)
Caller stated she was told to have an MRI done and has not heard [facility] yet. Would like to know where the MRI is being held at so she can schedule.

## 2023-03-31 NOTE — Telephone Encounter (Signed)
Pls let her know that there is no need to do MRI at this time. Her brain MRI earlier this year was fine, no need to repeat it.

## 2023-04-01 NOTE — Telephone Encounter (Signed)
Pt called no answer unable to leave a voice mail  

## 2023-04-02 NOTE — Telephone Encounter (Signed)
Pt called an informed that  there is no need to do MRI at this time. Her brain MRI earlier this year was fine, no need to repeat it.

## 2023-04-03 ENCOUNTER — Ambulatory Visit: Payer: Self-pay | Admitting: *Deleted

## 2023-04-03 NOTE — Patient Outreach (Addendum)
Care Coordination   Follow Up Visit Note   04/03/2023 Name: [name] MRN: [id number] DOB: 1971-04-06  [name] is a 52 y.o. year old female who sees Anabel Halon, MD for primary care. I spoke with  [name] by phone today.  What matters to the patients health and wellness today?  MRI not completed, Medication management gets confusing with frequent changes  MRI- She called her MD office on 03/31/23 about an MRI. She was informed another MRI is not needed & previous 06/09/22 MRI was fine & showed enough for MD to start treatment plan. The MRI does not need repeating. She was informed of this on 04/02/23 by Neurology staff.    She voiced understanding that her imaging reports in EPIC indicated her last MRI of the brain and spine was ordered on 05/14/22 and completed on 06/09/2022. The 06/09/22 MRI was within normal limits./showing no abnormalities. She would like RN CM to inquiry if she could still have another one this year. She still would like to have a MRI to check on her use of hormonal medications for years which she has heard may cause brain cancer. She has been on Depo-Provera contraceptive ? Since 2022  RN CM is able to see in EPIC only an incomplete order for an ultrasound ordered by Dr Janeece Agee on 08/19/22 for nephrolithiasis  Patient does have a documented history of memory loss  Medications she has [facility] to package her pills but has medication reconcilement concerns.  She reports being on a "half a pill but I don't know what it is because I can't see what is on the pill"     She has to conclude the call to complete a task and requests to return a call to RN CM to assist with review of medicines compared to [name] and after summary visit (discharge sheets)  2:36 pm Dr Allena Katz, pcp and Dr Karel Jarvis, Neurology both responded patient is not in need of a new MRI at this time. Will be discussed with patient pending a return call from her or at next scheduled  outreach. Response from both providers appreciated.    Goals Addressed               This Visit's Progress     Patient Stated     Inquire if MRI can be completed to monitor Depo-Provera contraceptive use, complete renal ultrasound and make appointment to urology (RN CM) (pt-stated)   Not on track     Ultrasound ordered by Dr Janeece Agee has not been completed since ordered in March 2024 Patient to return a call to RN CM   Interventions Today    Flowsheet Row Most Recent Value  Chronic Disease   Chronic disease during today's visit Other  [MRI for use of Depo (brain cancer monitoring), Ultrasound, medicines]  General Interventions   General Interventions Discussed/Reviewed General Interventions Reviewed, Labs, Doctor Visits, Communication with  Labs --  Annabell Sabal 06/09/22 brain & spine imaging = incomplete ultrasound ordered by Dr McKenzie]  Doctor Visits Discussed/Reviewed Doctor Visits Reviewed, PCP, Specialist  PCP/Specialist Visits Compliance with follow-up visit, Contact provider for referral to  [facility] provider for referral to PCP, Specialist  [messaged neurology/pcp related to patient wish to have a MRi to monitory Depo-Provera contraceptive use]  Communication with --  [2:36 pm Dr Allena Katz, pcp and Dr Karel Jarvis, Neurology both responded patient is not in need of a new MRI at this time. Will be discussed  with patient pending a return call from her or at next scheduled outreach. Response from both providers appreciated.]  Exercise Interventions   Exercise Discussed/Reviewed Exercise Discussed, Physical Activity  Physical Activity Discussed/Reviewed Physical Activity Reviewed, Home Exercise Program (HEP)  [encouraged]  Education Interventions   Education Provided Provided Education  [MRI completion,]  Provided Verbal Education On Other  Mental Health Interventions   Mental Health Discussed/Reviewed Mental Health Reviewed, Coping Strategies  Pharmacy Interventions   Pharmacy  Dicussed/Reviewed Pharmacy Topics Reviewed, Medications and their functions, Affording Medications              SDOH assessments and interventions completed:  No     Care Coordination Interventions:  Yes, provided   Follow up plan: Follow up call scheduled for 04/17/23    Encounter Outcome:  Patient Visit Completed   [name], RN, BSN, [facility]  VBCI Care Management Coordinator  [phone]  Fax: [phone]

## 2023-04-03 NOTE — Patient Instructions (Addendum)
Visit Information  Thank you for taking time to visit with me today. Please don't hesitate to contact me if I can be of assistance to you.   Following are the goals we discussed today:   Goals Addressed               This Visit's Progress     Patient Stated     Inquire if MRI can be completed to monitor Depo-Provera contraceptive use, complete renal ultrasound and make appointment to urology (RN CM) (pt-stated)   Not on track     Ultrasound ordered by Dr Janeece Agee has not been completed since ordered in March 2024 Patient to return a call to RN CM   Interventions Today    Flowsheet Row Most Recent Value  Chronic Disease   Chronic disease during today's visit Other  [MRI for use of Depo (brain cancer monitoring), Ultrasound, medicines]  General Interventions   General Interventions Discussed/Reviewed General Interventions Reviewed, Labs, Doctor Visits, Communication with  Labs --  Annabell Sabal 06/09/22 brain & spine imaging = incomplete ultrasound ordered by Dr McKenzie]  Doctor Visits Discussed/Reviewed Doctor Visits Reviewed, PCP, Specialist  PCP/Specialist Visits Compliance with follow-up visit, Contact provider for referral to  [facility] provider for referral to PCP, Specialist  [messaged neurology/pcp related to patient wish to have a MRi to monitory Depo-Provera contraceptive use]  Communication with --  [2:36 pm Dr Allena Katz, pcp and Dr Karel Jarvis, Neurology both responded patient is not in need of a new MRI at this time. Will be discussed with patient pending a return call from her or at next scheduled outreach. Response from both providers appreciated.]  Exercise Interventions   Exercise Discussed/Reviewed Exercise Discussed, Physical Activity  Physical Activity Discussed/Reviewed Physical Activity Reviewed, Home Exercise Program (HEP)  [encouraged]  Education Interventions   Education Provided Provided Education  [MRI completion,]  Provided Verbal Education On Other  Mental Health  Interventions   Mental Health Discussed/Reviewed Mental Health Reviewed, Coping Strategies  Pharmacy Interventions   Pharmacy Dicussed/Reviewed Pharmacy Topics Reviewed, Medications and their functions, Affording Medications              Our next appointment is by telephone on 04/17/23 at 1 pm  Please call the care guide team at [phone] if you need to cancel or reschedule your appointment.   If you are experiencing a Mental Health or Behavioral Health Crisis or need someone to talk to, please call the Suicide and Crisis Lifeline: 988 call the [place] National Suicide Prevention Lifeline: (270)339-1292 or TTY: (315)791-9628 TTY (925)482-1338) to talk to a trained counselor call 1-800-273-TALK (toll free, 24 hour hotline) call the [facility]: 2050185564 call 911   Patient verbalizes understanding of instructions and care plan provided today and agrees to view in MyChart. Active MyChart status and patient understanding of how to access instructions and care plan via MyChart confirmed with patient.     The patient has been provided with contact information for the care management team and has been advised to call with any health related questions or concerns.   Prince Olivier L. Noelle Penner, RN, BSN, [facility]  VBCI Care Management Coordinator  [phone]  Fax: [phone]

## 2023-04-17 ENCOUNTER — Ambulatory Visit: Payer: Self-pay | Admitting: *Deleted

## 2023-04-17 NOTE — Patient Outreach (Addendum)
  Care Coordination   Follow Up Visit Note   04/17/2023 Name: [name] MRN: [id number] DOB: 19-Sep-1970  [name] is a 52 y.o. year old female who sees Anabel Halon, MD for primary care. I spoke with  [name] by phone today.  What matters to the patients health and wellness today?  MRI vs CT to evaluate/monitor for side effects of Depo,  Discussed her concern with memory changes Agrees for RN CM to discuss with her pcp & GYN, her inquiry for CT to check for side effects of Depo Her pcp responded to RN CM and referred to her GYN, Dr Despina Hidden   medication management- [facility]  She was able to confirm her Seroquel was decreased from 400 to 200  Xanax tid was decreased from to bid prn Taken off Chlorthalidone on 03/10/23       Goals Addressed               This Visit's Progress     Patient Stated     Inquire if CT can be completed to monitor Depo-Provera side effects, complete renal ultrasound and make appointment to urology (RN CM) (pt-stated)        Ultrasound ordered by Dr Janeece Agee has not been completed since ordered in March 2024 Patient to return a call to RN CM  Patient will receive an answer related to testing for Depo use from GYN  Interventions Today    Flowsheet Row Most Recent Value  Chronic Disease   Chronic disease during today's visit Other  [MRI vs CT, medication management]  General Interventions   General Interventions Discussed/Reviewed General Interventions Reviewed, [facility], Doctor Visits, Communication with  Doctor Visits Discussed/Reviewed Doctor Visits Discussed, PCP, Specialist  [updated her on the message & responses from her pcp & neurologist related to MRI for monitoring of depo side effect]  PCP/Specialist Visits Compliance with follow-up visit  [Reviewed her upcoming visits with pcp January 2025, neurologist january 2025, nephrology (05/11/23)]  Contacted provider for referral to PCP, Specialist  [Inquired about  monitoring for Depo side effects]  Communication with --  [sent secure chat to pcp & GYN to see it CT or other tests needed to follow up on side effects of Depo. Sent [employer] pool message to Dr Despina Hidden about patient interest in CT after calling the office.(office closed)]  Education Interventions   Education Provided Provided Education  [Discussed differences in MRI & CT]  Provided Verbal Education On Other  Mental Health Interventions   Mental Health Discussed/Reviewed Mental Health Reviewed, Coping Strategies  Pharmacy Interventions   Pharmacy Dicussed/Reviewed --  [offered for RN CM to outreach to [facility] drug for medication reconciliation but this is not preferred]              SDOH assessments and interventions completed:  No     Care Coordination Interventions:  Yes, provided   Follow up plan: Follow up call scheduled for 05/18/23    Encounter Outcome:  Patient Visit Completed   [name], RN, BSN, [facility]  VBCI Care Management Coordinator  [phone]  Fax: [phone]

## 2023-04-17 NOTE — Patient Instructions (Addendum)
Visit Information  Thank you for taking time to visit with me today. Please don't hesitate to contact me if I can be of assistance to you.   Following are the goals we discussed today:   Goals Addressed               This Visit's Progress     Patient Stated     Inquire if CT can be completed to monitor Depo-Provera side effects, complete renal ultrasound and make appointment to urology (RN CM) (pt-stated)        Ultrasound ordered by Dr Janeece Agee has not been completed since ordered in March 2024 Patient to return a call to RN CM  Patient will receive an answer related to testing for Depo use from GYN  Interventions Today    Flowsheet Row Most Recent Value  Chronic Disease   Chronic disease during today's visit Other  [MRI vs CT, medication management]  General Interventions   General Interventions Discussed/Reviewed General Interventions Reviewed, [facility], Doctor Visits, Communication with  Doctor Visits Discussed/Reviewed Doctor Visits Discussed, PCP, Specialist  [updated her on the message & responses from her pcp & neurologist related to MRI for monitoring of depo side effect]  PCP/Specialist Visits Compliance with follow-up visit  [Reviewed her upcoming visits with pcp January 2025, neurologist january 2025, nephrology (05/11/23)]  Contacted provider for referral to PCP, Specialist  [Inquired about monitoring for Depo side effects]  Communication with --  [sent secure chat to pcp & GYN to see it CT or other tests needed to follow up on side effects of Depo. Sent [employer] pool message to Dr Despina Hidden about patient interest in CT after calling the office.(office closed)]  Education Interventions   Education Provided Provided Education  [Discussed differences in MRI & CT]  Provided Verbal Education On Other  Mental Health Interventions   Mental Health Discussed/Reviewed Mental Health Reviewed, Coping Strategies  Pharmacy Interventions   Pharmacy Dicussed/Reviewed --   [offered for RN CM to outreach to [facility] drug for medication reconciliation but this is not preferred]              Our next appointment is by telephone on 06/17/22 at 1 pm  Please call the care guide team at [phone] if you need to cancel or reschedule your appointment.   If you are experiencing a Mental Health or Behavioral Health Crisis or need someone to talk to, please call the Suicide and Crisis Lifeline: 988 call the [place] National Suicide Prevention Lifeline: 920 409 6650 or TTY: 929-259-3900 TTY 7065025918) to talk to a trained counselor call 1-800-273-TALK (toll free, 24 hour hotline) call the [facility]: 332-547-9415 call 911   Patient verbalizes understanding of instructions and care plan provided today and agrees to view in MyChart. Active MyChart status and patient understanding of how to access instructions and care plan via MyChart confirmed with patient.     The patient has been provided with contact information for the care management team and has been advised to call with any health related questions or concerns.    Kairi Tufo L. Noelle Penner, RN, BSN, [facility]  VBCI Care Management Coordinator  [phone]  Fax: [phone]

## 2023-05-04 DIAGNOSIS — R809 Proteinuria, unspecified: Secondary | ICD-10-CM | POA: Diagnosis not present

## 2023-05-04 DIAGNOSIS — N200 Calculus of kidney: Secondary | ICD-10-CM | POA: Diagnosis not present

## 2023-05-04 DIAGNOSIS — N189 Chronic kidney disease, unspecified: Secondary | ICD-10-CM | POA: Diagnosis not present

## 2023-05-04 DIAGNOSIS — N1831 Chronic kidney disease, stage 3a: Secondary | ICD-10-CM | POA: Diagnosis not present

## 2023-05-07 ENCOUNTER — Ambulatory Visit: Payer: Self-pay | Admitting: *Deleted

## 2023-05-07 NOTE — Patient Outreach (Signed)
  Care Coordination   Follow Up Visit Note   05/07/2023 Name: [name] MRN: [id number] DOB: 1970-08-15  [name] is a 52 y.o. year old female who sees Sabrina Halon, MD for primary care. I spoke with  [name] & her OB GYN staff by phone today & in basket clinical pool message.  What matters to the patients health and wellness today?  MRI/CT related long use of depo- patient concern about side effects of depo     Goals Addressed               This Visit's Progress     Patient Stated     Inquire if CT can be completed to monitor Depo-Provera side effects, complete renal ultrasound and make appointment to urology (RN CM) (pt-stated)   On track     Ultrasound ordered by Dr [name] has not been completed since ordered in March 2024 Patient to return a call to RN CM  Patient will receive an answer related to testing for Depo use from GYN 05/07/23 pt to discuss with OBGYN, Eure  Interventions Today    Flowsheet Row Most Recent Value  Chronic Disease   Chronic disease during today's visit Other  [MRI/CT related long use of depo- patient concern about side effects of depo]  General Interventions   General Interventions Discussed/Reviewed General Interventions Reviewed, Health Screening, Doctor Visits, Communication with  Doctor Visits Discussed/Reviewed Doctor Visits Reviewed, Specialist  Health Screening --  [imaging for long term use of Depo ? side effects]  PCP/Specialist Visits Contact provider for referral to  [facility] provider for referral to Specialist  Communication with PCP/Specialists  [name] to Dr [name] office staff & sent in basket pool message r/t imaging (CT, MRI)  for her concern about long term use of Depo after hearing something on tv. Depo stopped a few months ago.]  Education Interventions   Education Provided Provided Education  [Discussed recommended consult with OB GYN as preferred by her pcp & other specialist]  Provided Verbal Education On  Other, When to see the doctor  Pharmacy Interventions   Pharmacy Dicussed/Reviewed Pharmacy Topics Reviewed, Medications and their functions, Medication Adherence  Medication Adherence Not taking medication  [Depo stopped a few months ago.  Pt will make an appointment to discuss wih GYN]              SDOH assessments and interventions completed:  No     Care Coordination Interventions:  Yes, provided   Follow up plan: Follow up call scheduled for 05/18/23    Encounter Outcome:  Patient Visit Completed   Sabrina Bradford L. Noelle Penner, RN, BSN, [facility]  VBCI Care Management Coordinator  [phone]  Fax: [phone]

## 2023-05-07 NOTE — Patient Instructions (Signed)
Visit Information  Thank you for taking time to visit with me today. Please don't hesitate to contact me if I can be of assistance to you.   Following are the goals we discussed today:   Goals Addressed               This Visit's Progress     Patient Stated     Inquire if CT can be completed to monitor Depo-Provera side effects, complete renal ultrasound and make appointment to urology (RN CM) (pt-stated)   On track     Ultrasound ordered by Dr Janeece Agee has not been completed since ordered in March 2024 Patient to return a call to RN CM  Patient will receive an answer related to testing for Depo use from GYN 05/07/23 pt to discuss with OBGYN, Eure  Interventions Today    Flowsheet Row Most Recent Value  Chronic Disease   Chronic disease during today's visit Other  [MRI/CT related long use of depo- patient concern about side effects of depo]  General Interventions   General Interventions Discussed/Reviewed General Interventions Reviewed, Health Screening, Doctor Visits, Communication with  Doctor Visits Discussed/Reviewed Doctor Visits Reviewed, Specialist  Health Screening --  [imaging for long term use of Depo ? side effects]  PCP/Specialist Visits Contact provider for referral to  [facility] provider for referral to Specialist  Communication with PCP/Specialists  Nicholes Stairs to Dr Despina Hidden office staff & sent in basket pool message r/t imaging (CT, MRI)  for her concern about long term use of Depo after hearing something on tv. Depo stopped a few months ago.]  Education Interventions   Education Provided Provided Education  [Discussed recommended consult with OB GYN as preferred by her pcp & other specialist]  Provided Verbal Education On Other, When to see the doctor  Pharmacy Interventions   Pharmacy Dicussed/Reviewed Pharmacy Topics Reviewed, Medications and their functions, Medication Adherence  Medication Adherence Not taking medication  [Depo stopped a few months ago.  Pt will  make an appointment to discuss wih GYN]              Our next appointment is by telephone on 05/18/23 at 1 pm  Please call the care guide team at [phone] if you need to cancel or reschedule your appointment.   If you are experiencing a Mental Health or Behavioral Health Crisis or need someone to talk to, please call the Suicide and Crisis Lifeline: 988 call the [place] National Suicide Prevention Lifeline: 9843448518 or TTY: 714 402 1920 TTY 4400686195) to talk to a trained counselor call 1-800-273-TALK (toll free, 24 hour hotline) call the [facility]: ([phone] call 911   Patient verbalizes understanding of instructions and care plan provided today and agrees to view in MyChart. Active MyChart status and patient understanding of how to access instructions and care plan via MyChart confirmed with patient.     The patient has been provided with contact information for the care management team and has been advised to call with any health related questions or concerns.   Nickalous Stingley L. Noelle Penner, RN, BSN, [facility]  VBCI Care Management Coordinator  [phone]  Fax: [phone]

## 2023-05-11 DIAGNOSIS — R809 Proteinuria, unspecified: Secondary | ICD-10-CM | POA: Diagnosis not present

## 2023-05-11 DIAGNOSIS — N1832 Chronic kidney disease, stage 3b: Secondary | ICD-10-CM | POA: Diagnosis not present

## 2023-05-11 DIAGNOSIS — N200 Calculus of kidney: Secondary | ICD-10-CM | POA: Diagnosis not present

## 2023-05-11 DIAGNOSIS — Z6839 Body mass index (BMI) 39.0-39.9, adult: Secondary | ICD-10-CM | POA: Diagnosis not present

## 2023-05-18 ENCOUNTER — Encounter: Payer: Medicare HMO | Admitting: *Deleted

## 2023-05-19 ENCOUNTER — Ambulatory Visit: Payer: Self-pay | Admitting: *Deleted

## 2023-05-19 ENCOUNTER — Other Ambulatory Visit: Payer: Self-pay | Admitting: Internal Medicine

## 2023-05-19 DIAGNOSIS — I10 Essential (primary) hypertension: Secondary | ICD-10-CM

## 2023-05-19 NOTE — Patient Outreach (Signed)
  Care Coordination   05/19/2023 Name: TRELA HEMRIC MRN: [id number] DOB: Jun 25, 1970   Care Coordination Outreach Attempts:  An unsuccessful outreach was attempted for an appointment today.  Follow Up Plan:  Additional outreach attempts will be made to offer the patient complex care management information and services.   Encounter Outcome:  No Answer   Care Coordination Interventions:  No, not indicated  Unable to leave a voice message Mail box is full for patient   [name], RN, BSN, [facility]  VBCI Care Management Coordinator  [phone]  Fax: [phone]

## 2023-05-20 ENCOUNTER — Ambulatory Visit: Payer: Self-pay | Admitting: *Deleted

## 2023-05-20 NOTE — Patient Outreach (Signed)
  Care Coordination   Follow Up Visit Note   05/20/2023 Name: Sabrina DELAPLANE MRN: [id number] DOB: [birth year]  [name] is a 52 y.o. year old female who sees Anabel Halon, MD for primary care. I spoke with  [name] by phone today.  What matters to the patients health and wellness today?  Patient returned a call to RN CM after a message was left on 05/19/23 She has not seen Dr Despina Hidden [facility] GYN She will make an appointment with OB GYN She will speak with VBCI CMA to reschedule another appointment with RN CM     Goals Addressed               This Visit's Progress     Patient Stated     Inquire if CT can be completed to monitor Depo-Provera side effects, complete renal ultrasound and make appointment to urology (RN CM) (pt-stated)   Not on track     Ultrasound ordered by Dr Janeece Agee has not been completed since ordered in March 2024 Patient to return a call to RN CM  Patient will receive an answer related to testing for Depo use from GYN 05/07/23 pt to discuss with OBGYN, Eure Interventions Today    Flowsheet Row Most Recent Value  Chronic Disease   Chronic disease during today's visit Other  [Return call from patient, her follow up with Dr Despina Hidden for advised treatment for previous use of Depo]  General Interventions   General Interventions Discussed/Reviewed General Interventions Reviewed, Doctor Visits  Doctor Visits Discussed/Reviewed Doctor Visits Reviewed, Specialist  [Confirmed patient has not made an appointment since the last outreach to speak with r Eure about his recommendations for testing after use of depo]  PCP/Specialist Visits Compliance with follow-up visit  [Pending a face to face or virtual visit to Dr Lutricia Feil GYN]  Education Interventions   Education Provided Provided Education  [Re reviewed the previous discussed recommended consult with OB GYN Dr Despina Hidden as preferred by her pcp & other specialist  to get testing if needed after her use of depo Patient states  she will call and make an appointment]  Provided Verbal Education On Other, When to see the doctor              SDOH assessments and interventions completed:  No     Care Coordination Interventions:  Yes, provided   Follow up plan:  pending patient     Encounter Outcome:  Patient Visit Completed   Diala Waxman L. Noelle Penner, RN, BSN, [facility]  VBCI Care Management Coordinator  [phone]  Fax: [phone]

## 2023-05-20 NOTE — Patient Instructions (Signed)
Visit Information  Thank you for taking time to visit with me today. Please don't hesitate to contact me if I can be of assistance to you.   Following are the goals we discussed today:   Goals Addressed               This Visit's Progress     Patient Stated     Inquire if CT can be completed to monitor Depo-Provera side effects, complete renal ultrasound and make appointment to urology (RN CM) (pt-stated)   Not on track     Ultrasound ordered by Dr Janeece Agee has not been completed since ordered in March 2024 Patient to return a call to RN CM  Patient will receive an answer related to testing for Depo use from GYN 05/07/23 pt to discuss with OBGYN, Eure Interventions Today    Flowsheet Row Most Recent Value  Chronic Disease   Chronic disease during today's visit Other  [Return call from patient, her follow up with Dr Despina Hidden for advised treatment for previous use of Depo]  General Interventions   General Interventions Discussed/Reviewed General Interventions Reviewed, Doctor Visits  Doctor Visits Discussed/Reviewed Doctor Visits Reviewed, Specialist  [Confirmed patient has not made an appointment since the last outreach to speak with r Eure about his recommendations for testing after use of depo]  PCP/Specialist Visits Compliance with follow-up visit  [Pending a face to face or virtual visit to Dr Lutricia Feil GYN]  Education Interventions   Education Provided Provided Education  [Re reviewed the previous discussed recommended consult with OB GYN Dr Despina Hidden as preferred by her pcp & other specialist  to get testing if needed after her use of depo Patient states she will call and make an appointment]  Provided Verbal Education On Other, When to see the doctor              Our next appointment is by telephone on pending patient schedule at pending patient scheduling with [facility] CMA  Please call the care guide team at [phone] if you need to cancel or reschedule your appointment.   If  you are experiencing a Mental Health or Behavioral Health Crisis or need someone to talk to, please call the Suicide and Crisis Lifeline: 988 call the [place] National Suicide Prevention Lifeline: 442-591-0214 or TTY: (934)610-9855 TTY (708)330-2376) to talk to a trained counselor call 1-800-273-TALK (toll free, 24 hour hotline) call the [facility]: (380)075-3743 call 911   Patient verbalizes understanding of instructions and care plan provided today and agrees to view in MyChart. Active MyChart status and patient understanding of how to access instructions and care plan via MyChart confirmed with patient.     The patient has been provided with contact information for the care management team and has been advised to call with any health related questions or concerns.   Luceil Herrin L. Noelle Penner, RN, BSN, [facility]  VBCI Care Management Coordinator  [phone]  Fax: [phone]

## 2023-05-28 ENCOUNTER — Encounter (INDEPENDENT_AMBULATORY_CARE_PROVIDER_SITE_OTHER): Payer: Self-pay | Admitting: *Deleted

## 2023-06-10 ENCOUNTER — Ambulatory Visit: Payer: Self-pay | Admitting: *Deleted

## 2023-06-10 NOTE — Patient Outreach (Signed)
  Care Coordination   Follow Up Visit Note   06/10/2023 Name: [name] MRN: [id number] DOB: [birth year]  [name] is a 53 y.o. year old female who sees Anabel Halon, MD for primary care. I spoke with  [name] by phone today. Patient driving, only able to speak briefly.  With a return call to patient after 2 pm, no answer & voice mailbox full Reach her after 4:40 pm    What matters to the patients health and wellness today?  GYN follow up,      Goals Addressed               This Visit's Progress     Patient Stated     Inquire if CT can be completed to monitor Depo-Provera side effects, complete renal ultrasound and make appointment to urology (RN CM) (pt-stated)        Ultrasound ordered by Dr Janeece Agee has not been completed since ordered in March 2024 Patient to return a call to RN CM  Patient will receive an answer related to testing for Depo use from GYN 05/07/23 pt to discuss with OBGYN, Eure Interventions Today    Flowsheet Row Most Recent Value  Chronic Disease   Chronic disease during today's visit Other  General Interventions   General Interventions Discussed/Reviewed General Interventions Reviewed, Doctor Visits              SDOH assessments and interventions completed:  No     Care Coordination Interventions:  Yes, provided   Follow up plan: Follow up call scheduled for 07/30/23    Encounter Outcome:  Patient Visit Completed   Sabrina Bradford L. Noelle Penner, RN, BSN, CCM [place]  [facility], [facility] RN Care Manager Direct Dial: [phone]  Fax: [phone]

## 2023-06-10 NOTE — Patient Instructions (Signed)
 Visit Information  Thank you for taking time to visit with me today. Please don't hesitate to contact me if I can be of assistance to you.   Following are the goals we discussed today:   Goals Addressed               This Visit's Progress     Patient Stated     Inquire if CT can be completed to monitor Depo-Provera side effects, complete renal ultrasound and make appointment to urology (RN CM) (pt-stated)        Ultrasound ordered by Dr Janeece Agee has not been completed since ordered in March 2024 Patient to return a call to RN CM  Patient will receive an answer related to testing for Depo use from GYN 05/07/23 pt to discuss with OBGYN, Eure Interventions Today    Flowsheet Row Most Recent Value  Chronic Disease   Chronic disease during today's visit Other  General Interventions   General Interventions Discussed/Reviewed General Interventions Reviewed, Doctor Visits              Our next appointment is by telephone on 07/30/23 at 2:30 pm  Please call the care guide team at [phone] if you need to cancel or reschedule your appointment.   If you are experiencing a Mental Health or Behavioral Health Crisis or need someone to talk to, please call the Suicide and Crisis Lifeline: 988 call the [place] National Suicide Prevention Lifeline: 918-852-5136 or TTY: 251-887-9668 TTY 4637521244) to talk to a trained counselor call 1-800-273-TALK (toll free, 24 hour hotline) call the [facility]: [phone] call 911   Patient verbalizes understanding of instructions and care plan provided today and agrees to view in MyChart. Active MyChart status and patient understanding of how to access instructions and care plan via MyChart confirmed with patient.     The patient has been provided with contact information for the care management team and has been advised to call with any health related questions or concerns.   [name] L. Noelle Penner, RN, BSN, CCM [place]   [facility], [facility] RN Care Manager Direct Dial: [phone]  Fax: [phone]

## 2023-06-15 ENCOUNTER — Ambulatory Visit: Payer: Medicare HMO | Admitting: Obstetrics & Gynecology

## 2023-06-15 ENCOUNTER — Encounter: Payer: Self-pay | Admitting: Obstetrics & Gynecology

## 2023-06-15 VITALS — BP 143/81 | HR 92 | Ht 62.0 in | Wt 225.0 lb

## 2023-06-15 DIAGNOSIS — Z3042 Encounter for surveillance of injectable contraceptive: Secondary | ICD-10-CM | POA: Diagnosis not present

## 2023-06-15 NOTE — Progress Notes (Signed)
 Follow up appointment for : Discussion of depo provera  + meningioma risks  Chief Complaint  Patient presents with   Follow-up    Blood pressure (!) 143/81, pulse 92, height 5' 2 (1.575 m), weight 225 lb (102.1 kg).  I gave the patient a copy of the BMJ article 08/2022: [email or web address] Explained the conclusion that there was a 5.6x RR of meningiomas on Depo vs controls She had a MRI 06/2022 for: CLINICAL DATA:  Provided history: Memory loss. Transient alteration of awareness. Frequent falls. Hyperreflexia. Additional history provided by the scanning technologist: The patient reports 5-6 recent syncopal episodes with lapses in memory.  Report: EXAM: MRI HEAD WITHOUT AND WITH CONTRAST   TECHNIQUE: Multiplanar, multiecho pulse sequences of the brain and surrounding structures were obtained without and with intravenous contrast.   CONTRAST:  10mL GADAVIST  GADOBUTROL  1 MMOL/ML IV SOLN   COMPARISON:  Report from head CT 01/27/2017 (images currently unavailable).   FINDINGS: A routine protocol brain MRI (without and with contrast) was ordered and performed.   The axial T2 FLAIR sequence is moderately motion degraded.   Brain:   No age advanced or lobar predominant parenchymal atrophy.   Within the limitations of motion degradation, no cortical encephalomalacia or significant cerebral white matter disease is identified.   There is no acute infarct.   No evidence of an intracranial mass.   No chronic intracranial blood products.   No extra-axial fluid collection.   No midline shift.   No pathologic intracranial enhancement identified.   Vascular: Maintained flow voids within the proximal large arterial vessels.   Skull and upper cervical spine: No focal suspicious marrow lesion.   Sinuses/Orbits: No mass or acute finding within the imaged orbits. Mild mucosal thickening within the bilateral ethmoid and maxillary sinuses.    IMPRESSION: The axial T2 FLAIR sequence is moderately motion degraded.   Within this limitation, there is unremarkable MRI appearance of the brain. No evidence of acute intracranial abnormality.   Mild mucosal thickening within the bilateral ethmoid and maxillary sinuses.     Electronically Signed   By: Rockey Childs D.O.   On: 06/09/2022 15:32  The report does not find any meningiomas All questions were answered  Nonetheless the patient is stopping her depo provera , she is 53 yo and has been on it for 30 years or so All questions were answered  MEDS ordered this encounter: No orders of the defined types were placed in this encounter.   Orders for this encounter: No orders of the defined types were placed in this encounter.   Impression + Management Plan   ICD-10-CM   1. Encounter for surveillance of injectable contraceptive: regarding meningiomas and depo provera  risk  Z30.42    06/2022 MRI was normal, article printed and given to patient and conclusions were discussed      Follow Up: Return if symptoms worsen or fail to improve.     All questions were answered.  Past Medical History:  Diagnosis Date   Anxiety    Chronic kidney disease    CKD (chronic kidney disease) stage 3, GFR 30-59 ml/min (HCC) 03/15/2019   Depression    Diarrhea 05/04/2017   Essential hypertension, benign 03/15/2019   History of kidney stones    Hypertension    Hypothyroidism    Hypothyroidism, adult 03/15/2019   Kidney stones    LGSIL on Pap smear of cervix 08/17/2020   Needs colpo per ASCCP immediate risk is 4.3 for CIN 3+risk    Obesity (BMI  30-39.9) 03/15/2019   Renal disorder    Vitamin D  deficiency disease 03/15/2019    Past Surgical History:  Procedure Laterality Date   BIOPSY  06/05/2017   Procedure: BIOPSY;  Surgeon: Golda Claudis PENNER, MD;  [facility];  Service: Endoscopy;;  colon   CHOLECYSTECTOMY     COLONOSCOPY N/A 06/05/2017   Procedure: COLONOSCOPY;   Surgeon: Golda Claudis PENNER, MD;  [facility];  Service: Endoscopy;  Laterality: N/A;  8:30   CYSTOSCOPY WITH RETROGRADE PYELOGRAM, URETEROSCOPY AND STENT PLACEMENT Left 10/04/2020   Procedure: CYSTOSCOPY WITH LEFT RETROGRADE PYELOGRAM, DIAGNOSTIC LEFT URETEROSCOPY AND LEFT URETEREAL STENT PLACEMENT;  Surgeon: Sherrilee Belvie CROME, MD;  Location: [facility];  Service: Urology;  Laterality: Left;   CYSTOSCOPY WITH RETROGRADE PYELOGRAM, URETEROSCOPY AND STENT PLACEMENT Left 10/16/2020   Procedure: CYSTOSCOPY WITH LEFT RETROGRADE PYELOGRAM, LEFT URETEROSCOPY  WITH LASER AND LEFT URETERAL STENT EXCHANGE;  Surgeon: Sherrilee Belvie CROME, MD;  Location: [facility];  Service: Urology;  Laterality: Left;   CYSTOSCOPY WITH RETROGRADE PYELOGRAM, URETEROSCOPY AND STENT PLACEMENT Bilateral 02/17/2022   Procedure: CYSTOSCOPY WITH RETROGRADE PYELOGRAM, URETEROSCOPY AND STENT PLACEMENT;  Surgeon: Sherrilee Belvie CROME, MD;  Location: [facility];  Service: Urology;  Laterality: Bilateral;   CYSTOSCOPY WITH RETROGRADE PYELOGRAM, URETEROSCOPY AND STENT PLACEMENT Left 06/26/2022   Procedure: CYSTOSCOPY WITH RETROGRADE PYELOGRAM, URETEROSCOPY AND STENT PLACEMENT;  Surgeon: Sherrilee Belvie CROME, MD;  Location: [facility];  Service: Urology;  Laterality: Left;   HOLMIUM LASER APPLICATION Left 10/16/2020   Procedure: HOLMIUM LASER APPLICATION;  Surgeon: Sherrilee Belvie CROME, MD;  Location: [facility];  Service: Urology;  Laterality: Left;   HOLMIUM LASER APPLICATION Left 06/26/2022   Procedure: HOLMIUM LASER APPLICATION;  Surgeon: Sherrilee Belvie CROME, MD;  Location: [facility];  Service: Urology;  Laterality: Left;   IR  NEPHROURETERAL CATH PLACE LEFT  08/05/2022   KIDNEY STONE SURGERY     kidney stones     LASER ABLATION CONDOLAMATA N/A 10/31/2020   Procedure: LASER ABLATION OF THE CERVIX;  Surgeon: Jayne Vonn DEL, MD;  Location: [facility];  Service: Gynecology;  Laterality: N/A;   NEPHROLITHOTOMY Left 08/07/2022   Procedure: NEPHROLITHOTOMY PERCUTANEOUS;   Surgeon: Sherrilee Belvie CROME, MD;  Location: [facility];  Service: Urology;  Laterality: Left;    OB History     Gravida  1   Para      Term      Preterm      AB  1   Living  0      SAB      IAB      Ectopic      Multiple      Live Births              Allergies  Allergen Reactions   Morphine And Codeine  Itching and Hives   Sulfa Antibiotics Swelling   Tape Itching and Other (See Comments)    Surgical     Social History   Socioeconomic History   Marital status: Divorced    Spouse name: Not on file   Number of children: Not on file   Years of education: Not on file   Highest education level: Not on file  Occupational History   Not on file  Tobacco Use   Smoking status: Every Day    Current packs/day: 0.00    Average packs/day: 0.5 packs/day for 20.0 years (10.0 ttl pk-yrs)    Types: Cigarettes    Start date: 02/08/2002  Last attempt to quit: 02/08/2022    Years since quitting: 1.3   Smokeless tobacco: Never  Vaping Use   Vaping status: Never Used  Substance and Sexual Activity   Alcohol use: No   Drug use: No   Sexual activity: Not Currently    Birth control/protection: Injection  Other Topics Concern   Not on file  Social History Narrative   Divorced twice,1st lasted 5 years,2nd 4 years.On disabilty secondary to mental illness since 2004.Previously used to [employer].Lives alone,has 2 cats.cright handed    Are you right handed or left handed? right   Are you currently employed ? no   What is your current occupation?   Do you live at home alone?no   Who lives with you? dad   What type of home do you live in: 1 story or 2 story? one    Caffeine 2 cans of soda   Social Drivers of Health   Financial Resource Strain: Low Risk  (07/21/2022)   Overall Financial Resource Strain (CARDIA)    Difficulty of Paying Living Expenses: Not very hard  Food Insecurity: No Food Insecurity (08/07/2022)   Hunger Vital Sign    Worried About Running  Out of Food in the Last Year: Never true    Ran Out of Food in the Last Year: Never true  Transportation Needs: No Transportation Needs (08/07/2022)   PRAPARE - Administrator, Civil Service (Medical): No    Lack of Transportation (Non-Medical): No  Physical Activity: Insufficiently Active (08/06/2020)   Exercise Vital Sign    Days of Exercise per Week: 2 days    Minutes of Exercise per Session: 10 min  Stress: No Stress Concern Present (07/21/2022)   [employer] of Occupational Health - Occupational Stress Questionnaire    Feeling of Stress : Only a little  Social Connections: Socially Isolated (07/21/2022)   Social Connection and Isolation Panel [NHANES]    Frequency of Communication with Friends and Family: More than three times a week    Frequency of Social Gatherings with Friends and Family: Three times a week    Attends Religious Services: Never    Active Member of Clubs or Organizations: No    Attends Banker Meetings: Never    Marital Status: Divorced    Family History  Problem Relation Age of Onset   Cancer Paternal Grandmother    Heart failure Paternal Grandmother    Cancer Maternal Grandmother    Cancer Father    Heart failure Father    Lung cancer Mother    Colon cancer Neg Hx

## 2023-06-19 ENCOUNTER — Ambulatory Visit: Payer: Medicare HMO | Admitting: Neurology

## 2023-06-19 ENCOUNTER — Encounter: Payer: Self-pay | Admitting: Neurology

## 2023-06-19 VITALS — BP 149/80 | HR 93 | Ht 62.0 in | Wt 227.4 lb

## 2023-06-19 DIAGNOSIS — R413 Other amnesia: Secondary | ICD-10-CM

## 2023-06-19 NOTE — Patient Instructions (Addendum)
Good to see you doing better and working on cutting down medications. Schedule Neurocognitive testing. Follow-up after testing.   FALL PRECAUTIONS: Be cautious when walking. Scan the area for obstacles that may increase the risk of trips and falls. When getting up in the mornings, sit up at the edge of the bed for a few minutes before getting out of bed. Consider elevating the bed at the head end to avoid drop of blood pressure when getting up. Walk always in a well-lit room (use night lights in the walls). Avoid area rugs or power cords from appliances in the middle of the walkways. Use a walker or a cane if necessary and consider physical therapy for balance exercise. Get your eyesight checked regularly.   RECOMMENDATIONS FOR ALL PATIENTS WITH MEMORY PROBLEMS: 1. Continue to exercise (Recommend 30 minutes of walking everyday, or 3 hours every week) 2. Increase social interactions - continue going to [place] and enjoy social gatherings with friends and family 3. Eat healthy, avoid fried foods and eat more fruits and vegetables 4. Maintain adequate blood pressure, blood sugar, and blood cholesterol level. Reducing the risk of stroke and cardiovascular disease also helps promoting better memory. 5. Avoid stressful situations. Live a simple life and avoid aggravations. Organize your time and prepare for the next day in anticipation. 6. Sleep well, avoid any interruptions of sleep and avoid any distractions in the bedroom that may interfere with adequate sleep quality 7. Avoid sugar, avoid sweets as there is a strong link between excessive sugar intake, diabetes, and cognitive impairment We discussed the Mediterranean diet, which has been shown to help patients reduce the risk of progressive memory disorders and reduces cardiovascular risk. This includes eating fish, eat fruits and green leafy vegetables, nuts like almonds and hazelnuts, walnuts, and also use olive oil. Avoid fast foods and fried foods as  much as possible. Avoid sweets and sugar as sugar use has been linked to worsening of memory function.      Mediterranean Diet  Why follow it? Research shows. Those who follow the Mediterranean diet have a reduced risk of heart disease  The diet is associated with a reduced incidence of Parkinson's and Alzheimer's diseases People following the diet may have longer life expectancies and lower rates of chronic diseases  The Dietary Guidelines for Americans recommends the Mediterranean diet as an eating plan to promote health and prevent disease  What Is the Mediterranean Diet?  Healthy eating plan based on typical foods and recipes of Mediterranean-style cooking The diet is primarily a plant based diet; these foods should make up a majority of meals   Starches - Plant based foods should make up a majority of meals - They are an important sources of vitamins, minerals, energy, antioxidants, and fiber - Choose whole grains, foods high in fiber and minimally processed items  - Typical grain sources include wheat, oats, barley, corn, brown rice, bulgar, farro, millet, polenta, couscous  - Various types of beans include chickpeas, lentils, fava beans, black beans, white beans   Fruits  Veggies - Large quantities of antioxidant rich fruits & veggies; 6 or more servings  - Vegetables can be eaten raw or lightly drizzled with oil and cooked  - Vegetables common to the traditional Mediterranean Diet include: artichokes, arugula, beets, broccoli, brussel sprouts, cabbage, carrots, celery, collard greens, cucumbers, eggplant, kale, leeks, lemons, lettuce, mushrooms, okra, onions, peas, peppers, potatoes, pumpkin, radishes, rutabaga, shallots, spinach, sweet potatoes, turnips, zucchini - Fruits common to the Mediterranean Diet include:  apples, apricots, avocados, cherries, clementines, dates, figs, grapefruits, grapes, melons, nectarines, oranges, peaches, pears, pomegranates, strawberries, tangerines  Fats  - Replace butter and margarine with healthy oils, such as olive oil, canola oil, and tahini  - Limit nuts to no more than a handful a day  - Nuts include walnuts, almonds, pecans, pistachios, pine nuts  - Limit or avoid candied, honey roasted or heavily salted nuts - Olives are central to the Mediterranean diet - can be eaten whole or used in a variety of dishes   Meats Protein - Limiting red meat: no more than a few times a month - When eating red meat: choose lean cuts and keep the portion to the size of deck of cards - Eggs: approx. 0 to 4 times a week  - Fish and lean poultry: at least 2 a week  - Healthy protein sources include, chicken, [place], lean beef, lamb - Increase intake of seafood such as tuna, salmon, trout, mackerel, shrimp, scallops - Avoid or limit high fat processed meats such as sausage and bacon  Dairy - Include moderate amounts of low fat dairy products  - Focus on healthy dairy such as fat free yogurt, skim milk, low or reduced fat cheese - Limit dairy products higher in fat such as whole or 2% milk, cheese, ice cream  Alcohol - Moderate amounts of red wine is ok  - No more than 5 oz daily for women (all ages) and men older than age 50  - No more than 10 oz of wine daily for men younger than 78  Other - Limit sweets and other desserts  - Use herbs and spices instead of salt to flavor foods  - Herbs and spices common to the traditional Mediterranean Diet include: basil, bay leaves, chives, cloves, cumin, fennel, garlic, lavender, marjoram, mint, oregano, parsley, pepper, rosemary, sage, savory, sumac, tarragon, thyme   It's not just a diet, it's a lifestyle:  The Mediterranean diet includes lifestyle factors typical of those in the region  Foods, drinks and meals are best eaten with others and savored Daily physical activity is important for overall good health This could be strenuous exercise like running and aerobics This could also be more leisurely activities such  as walking, housework, yard-work, or taking the stairs Moderation is the key; a balanced and healthy diet accommodates most foods and drinks Consider portion sizes and frequency of consumption of certain foods   Meal Ideas & Options:  Breakfast:  Whole wheat toast or whole wheat English muffins with peanut butter & hard boiled egg Steel cut oats topped with apples & cinnamon and skim milk  Fresh fruit: banana, strawberries, melon, berries, peaches  Smoothies: strawberries, bananas, greek yogurt, peanut butter Low fat greek yogurt with blueberries and granola  Egg white omelet with spinach and mushrooms Breakfast couscous: whole wheat couscous, apricots, skim milk, cranberries  Sandwiches:  Hummus and grilled vegetables (peppers, zucchini, squash) on whole wheat bread   Grilled chicken on whole wheat pita with lettuce, tomatoes, cucumbers or tzatziki  [place] salad on whole wheat bread: tuna salad made with greek yogurt, olives, red peppers, capers, green onions Garlic rosemary lamb pita: lamb sauted with garlic, rosemary, salt & pepper; add lettuce, cucumber, greek yogurt to pita - flavor with lemon juice and black pepper  Seafood:  Mediterranean grilled salmon, seasoned with garlic, basil, parsley, lemon juice and black pepper Shrimp, lemon, and spinach whole-grain pasta salad made with low fat greek yogurt  Seared scallops with lemon  orzo  Seared tuna steaks seasoned salt, pepper, coriander topped with tomato mixture of olives, tomatoes, olive oil, minced garlic, parsley, green onions and cappers  Meats:  Herbed greek chicken salad with kalamata olives, cucumber, feta  Red bell peppers stuffed with spinach, bulgur, lean ground beef (or lentils) & topped with feta   Kebabs: skewers of chicken, tomatoes, onions, zucchini, squash  [place] burgers: made with red onions, mint, dill, lemon juice, feta cheese topped with roasted red peppers Vegetarian Cucumber salad: cucumbers, artichoke hearts,  celery, red onion, feta cheese, tossed in olive oil & lemon juice  Hummus and whole grain pita points with a greek salad (lettuce, tomato, feta, olives, cucumbers, red onion) Lentil soup with celery, carrots made with vegetable broth, garlic, salt and pepper  Tabouli salad: parsley, bulgur, mint, scallions, cucumbers, tomato, radishes, lemon juice, olive oil, salt and pepper.

## 2023-06-19 NOTE — Progress Notes (Unsigned)
NEUROLOGY FOLLOW UP OFFICE NOTE  BARBARAJO Jennings [id number] September 02, 1970  HISTORY OF PRESENT ILLNESS: I had the pleasure of seeing [name] in follow-up in the neurology clinic on 06/19/2023.  The patient was last seen 4 months ago for frequent falls. She is again accompanied by her sister [name] who helps supplement the history today.  Records and images were personally reviewed where available.  She has had extensive testing with brain MRI with and without contrast in 06/2022 which was motion degraded but there were no abnormalities seen. She was hyperreflexic on initial exam. MRI cervical spine with and without contrast did not show any myelopathy. There was mild to moderate disc degeneration at C4-5 and C5-6, disc bulge mildly effaces the ventral thecal sac and contacts ventral aspect of spinal cord. Her 1-hour EEG in 05/2022 was normal, she then had a 26-hour EEG in 06/2022 which was also normal, typical events not captured.   [name] on ice, yest going down steps No falls since April No passing out Lives with dad Seroquel 400 to 200mg ; a little bit of sleeping change, mood okay Driving in familiar neighborhood and does not know where they are, or middle of sentence and forgets what it was She would drive, not getting lost,  Good with meds; father manages finances; she manages some without issues 2 a day if any; every night usually Not seeing therapist Handwriting off   She was feeling little pulses in different parts of her body and was started on Ingrezza which did help, however she then started having really bad back and forth jaw movements. Ingrezza was stopped and she was put on Cogentin 1mg  BID which has helped. She continues on Abilify 5mg  daily and Seroquel 400mg  daily. She is concerned she has Parkinson's disease because of the movements and forgetfulness. She reports short term and long term memory changes. She would be in the middle of a sentence and lose her train of thought.  Medications come in piillpacks, she denies forgetting medications. She denies missing any bill payments or burning food on the stove. She drives and denies getting lost but sometimes wonders where she is until she sees a familiar landmark. No family history of dementia, PD. No history of head injuries. They are happy to report that she has not had any falls since April, [name] notes this occurred when her medications were reduced.    History on Initial Assessment 05/14/2022: This is a 53 year old right-handed man with a history of hypertension, hypothyroidism, nephrolithiasis, CKD, anxiety, depression, presenting for frequent falls. She states she does not remember what occurs before or after a fall. Symptoms started 6-8 months ago, last fall was 2 weeks ago. She lives with her father. Her father mentioned she got up one night to use the bathroom and fell, she was awake but not alert. She does not recall going to the bathroom or him helping her up. Another time 3-4 months ago, she was out of it and slurring her speech. She had fallen several times, getting up to use the bathroom and fall, "awake but not really awake," occurring in the daytime as well. She was initially falling 4 times in a week so [name] stayed with her for 2 weeks, then all of a sudden, she was back to her old self. Symptoms then recurrent for 1-2 weeks where [name] and their aunt stayed to care for her. Speech was slurred so bad, she could not understand people around her but [name] reports  she would follow instructions. One time she had urinary incontinence. [name] notes she gets really irritable during those periods then would not remember what she said. She reports her memory is bad in general. Over the past year, she denies getting lost driving but would get disoriented for a minute then realize where she is. She denies missing medications or bill payments but forgets to make appointments. She reports it takes a lot of effort to do what she  needs to, such as paperwork. Her handwriting has also changed, it starts good then goes off the line.   They deny any staring/unresponsive episodes. She denies any olfactory/gustatory hallucinations. She has numbness and burning on the left lateral thigh. It hurts to lift her left arm, no numbness/tingling. Her left hip also bothers her. She states the pain is not from prior falls. Her feet feel cold but they are warm when touched. She has brief body twitches and developed tics which Ingrezza sometimes helps with. She denies any headaches, neck/back pain, dysarthria/dysphagia. She has a lot of dizziness. She was having a little double vision during the months she had the episodes. She has some constipation but also has times she cannot hold her BM and has incontinence. [name] would help her get cleaned up but she would not recall how she got cleaned. She usually gets 5 hours of sleep and denies any change in sleep pattern or alcohol intake during the episodes. Mood is okay, she gets anxious and cold. She takes Xanax every night with no missed doses. She is also on Lamotrigine 200mg  BID for mood. No family history of similar symptoms. She had a normal birth and early development.  There is no history of febrile convulsions, CNS infections such as meningitis/encephalitis, significant traumatic brain injury, neurosurgical procedures, or family history of seizures.  PAST MEDICAL HISTORY: Past Medical History:  Diagnosis Date   Anxiety    Chronic kidney disease    CKD (chronic kidney disease) stage 3, GFR 30-59 ml/min (HCC) 03/15/2019   Depression    Diarrhea 05/04/2017   Essential hypertension, benign 03/15/2019   History of kidney stones    Hypertension    Hypothyroidism    Hypothyroidism, adult 03/15/2019   Kidney stones    LGSIL on Pap smear of cervix 08/17/2020   Needs colpo per [facility] immediate risk is 4.3 for CIN 3+risk    Obesity (BMI 30-39.9) 03/15/2019   Renal disorder    Vitamin D  deficiency disease 03/15/2019    MEDICATIONS: Current Outpatient Medications on File Prior to Visit  Medication Sig Dispense Refill   ALPRAZolam (XANAX) 1 MG tablet Take 1 mg by mouth 2 (two) times daily as needed for anxiety. 04/17/23 Patient reports xanax changed from tid to bid     amLODipine (NORVASC) 5 MG tablet TAKE 1 TABLET BY MOUTH DAILY 30 tablet 5   ARIPiprazole (ABILIFY) 5 MG tablet Take 5 mg by mouth at bedtime.     atorvastatin (LIPITOR) 20 MG tablet TAKE 1 TABLET BY MOUTH EVERY DAY 90 tablet 3   benztropine (COGENTIN) 1 MG tablet Take 1 mg by mouth 2 (two) times daily.     carvedilol (COREG) 25 MG tablet TAKE 1 TABLET BY MOUTH TWICE DAILY (Patient not taking: Reported on 06/15/2023) 180 tablet 2   Cholecalciferol (VITAMIN D3) 125 MCG (5000 UT) TABS Take 10,000 Units by mouth daily. (Patient not taking: Reported on 02/09/2023)     doxepin (SINEQUAN) 75 MG capsule Take 225 mg by mouth at bedtime.  lamoTRIgine (LAMICTAL) 200 MG tablet Take 200 mg by mouth 2 (two) times daily.     losartan (COZAAR) 25 MG tablet Take 25 mg by mouth in the morning.     nitrofurantoin (MACRODANTIN) 50 MG capsule TAKE ONE CAPSULE BY MOUTH AT BEDTIME 30 capsule 11   potassium citrate (UROCIT-K) 10 MEQ (1080 MG) SR tablet Take 20 mEq by mouth in the morning and at bedtime.     QUEtiapine (SEROQUEL XR) 200 MG 24 hr tablet Take 200 mg by mouth at bedtime.     QUEtiapine (SEROQUEL XR) 400 MG 24 hr tablet Take 200 mg by mouth at bedtime. 04/17/23 Pt reports seroquel decreased from 400 to 200 mg (Patient not taking: Reported on 06/15/2023)     No current facility-administered medications on file prior to visit.    ALLERGIES: Allergies  Allergen Reactions   Morphine And Codeine Itching and Hives   Sulfa Antibiotics Swelling   Tape Itching and Other (See Comments)    Surgical     FAMILY HISTORY: Family History  Problem Relation Age of Onset   Cancer Paternal Grandmother    Heart failure Paternal  Grandmother    Cancer Maternal Grandmother    Cancer Father    Heart failure Father    Lung cancer Mother    Colon cancer Neg Hx     SOCIAL HISTORY: Social History   Socioeconomic History   Marital status: Divorced    Spouse name: Not on file   Number of children: Not on file   Years of education: Not on file   Highest education level: Not on file  Occupational History   Not on file  Tobacco Use   Smoking status: Every Day    Current packs/day: 0.00    Average packs/day: 0.5 packs/day for 20.0 years (10.0 ttl pk-yrs)    Types: Cigarettes    Start date: 02/08/2002    Last attempt to quit: 02/08/2022    Years since quitting: 1.3   Smokeless tobacco: Never  Vaping Use   Vaping status: Never Used  Substance and Sexual Activity   Alcohol use: No   Drug use: No   Sexual activity: Not Currently    Birth control/protection: Injection  Other Topics Concern   Not on file  Social History Narrative   Divorced twice,1st lasted 5 years,2nd 4 years.On disabilty secondary to mental illness since 2004.Previously used to [employer].Lives alone,has 2 cats.cright handed    Are you right handed or left handed? right   Are you currently employed ? no   What is your current occupation?   Do you live at home alone?no   Who lives with you? dad   What type of home do you live in: 1 story or 2 story? one    Caffeine 2 cans of soda   Social Drivers of Health   Financial Resource Strain: Low Risk  (07/21/2022)   Overall Financial Resource Strain (CARDIA)    Difficulty of Paying Living Expenses: Not very hard  Food Insecurity: No Food Insecurity (08/07/2022)   Hunger Vital Sign    Worried About Running Out of Food in the Last Year: Never true    Ran Out of Food in the Last Year: Never true  Transportation Needs: No Transportation Needs (08/07/2022)   PRAPARE - Administrator, Civil Service (Medical): No    Lack of Transportation (Non-Medical): No  Physical Activity:  Insufficiently Active (08/06/2020)   Exercise Vital Sign  Days of Exercise per Week: 2 days    Minutes of Exercise per Session: 10 min  Stress: No Stress Concern Present (07/21/2022)   [employer] of Occupational Health - Occupational Stress Questionnaire    Feeling of Stress : Only a little  Social Connections: Socially Isolated (07/21/2022)   Social Connection and Isolation Panel [NHANES]    Frequency of Communication with Friends and Family: More than three times a week    Frequency of Social Gatherings with Friends and Family: Three times a week    Attends Religious Services: Never    Active Member of Clubs or Organizations: No    Attends Banker Meetings: Never    Marital Status: Divorced  Catering manager Violence: Not At Risk (08/07/2022)   Humiliation, Afraid, Rape, and Kick questionnaire    Fear of Current or Ex-Partner: No    Emotionally Abused: No    Physically Abused: No    Sexually Abused: No     PHYSICAL EXAM: There were no vitals filed for this visit. General: No acute distress Head:  Normocephalic/atraumatic Skin/Extremities: No rash, no edema Neurological Exam: alert and oriented to person, place, and time. No aphasia or dysarthria. Fund of knowledge is appropriate.  Recent and remote memory are intact.  Attention and concentration are normal.   Cranial nerves: Pupils equal, round. Extraocular movements intact with no nystagmus. Visual fields full.  No facial asymmetry.  Motor: Bulk and tone normal, muscle strength 5/5 throughout with no pronator drift.   Finger to nose testing intact.  Gait narrow-based and steady, able to tandem walk adequately.  Romberg negative.   IMPRESSION: This is a 53 yo RH woman with a history of hypertension, hypothyroidism, nephrolithiasis, CKD, anxiety, depression, with frequent falls where she has no recollection of events. MRI brain unremarkable, MRI cervical spine no myelopathy seen. Her routine and 26-hour EEG were  normal. She has not had any further falls since April 2024 when medications were reduced. She is concerned about Parkinson's disease due to abnormal jaw movements and memory loss. There are no abnormal movements on exam today, no parkinsonian signs. She reports abnormal jaw movements stopped with stopping Ingrezza and starting Cogentin. The patient was reassured that there is no indication of Parkinson's disease today. We discussed different causes of memory loss, I suspect a lot is still due to her psychiatric medications or underlying psychiatric disorder (depression/anxiety), she will speak to her Psychiatrist as to how much medication reduction can be done with continued control of symptoms. If no change in cognition, we may consider Neuropsychological testing. Check TSH and B12. We discussed the importance of control of vascular risk factors, physical exercise, MIND diet for overall brain health. Follow-up in 4-5 months, call for any changes.     Thank you for allowing me to participate in *** care.  Please do not hesitate to call for any questions or concerns.  The duration of this appointment visit was *** minutes of face-to-face time with the patient.  Greater than 50% of this time was spent in counseling, explanation of diagnosis, planning of further management, and coordination of care.   Patrcia Dolly, M.D.   CC: ***

## 2023-06-26 DIAGNOSIS — F3175 Bipolar disorder, in partial remission, most recent episode depressed: Secondary | ICD-10-CM | POA: Diagnosis not present

## 2023-07-02 DIAGNOSIS — X32XXXA Exposure to sunlight, initial encounter: Secondary | ICD-10-CM | POA: Diagnosis not present

## 2023-07-02 DIAGNOSIS — C44311 Basal cell carcinoma of skin of nose: Secondary | ICD-10-CM | POA: Diagnosis not present

## 2023-07-02 DIAGNOSIS — L570 Actinic keratosis: Secondary | ICD-10-CM | POA: Diagnosis not present

## 2023-07-13 ENCOUNTER — Ambulatory Visit: Payer: Medicare HMO | Admitting: Internal Medicine

## 2023-07-30 ENCOUNTER — Ambulatory Visit: Payer: Self-pay | Admitting: *Deleted

## 2023-07-30 IMAGING — CT CT ABD-PEL WO/W CM
3 of 12 series · 12 of 46 positions shown, 18 images · IV contrast (Omnipaque or Isovue)
Comparison: 12/17/2020

CLINICAL DATA: Gross hematuria, chronic cystitis

EXAM:
CT ABDOMEN AND PELVIS WITHOUT AND WITH CONTRAST
TECHNIQUE: Multidetector CT imaging of the abdomen and pelvis was performed
following the standard protocol before and following the bolus
administration of intravenous contrast.

[Series 2: axial pre · axial · non-contrast · 0.97mm/px · z∈[+892,+1192]mm · 5 of 92 slices shown, 10 images]
[im 16/92  soft-tissue]
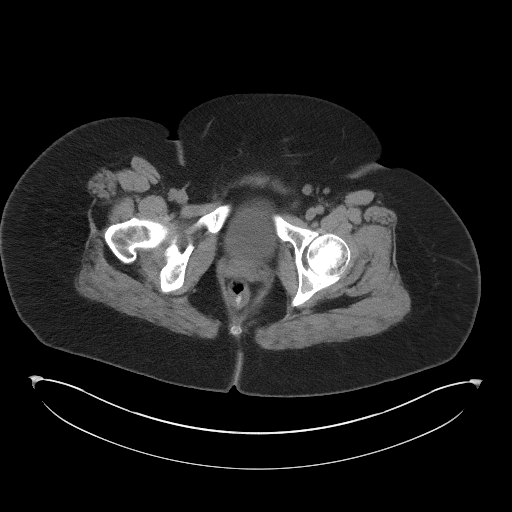
[im 16/92  bone]
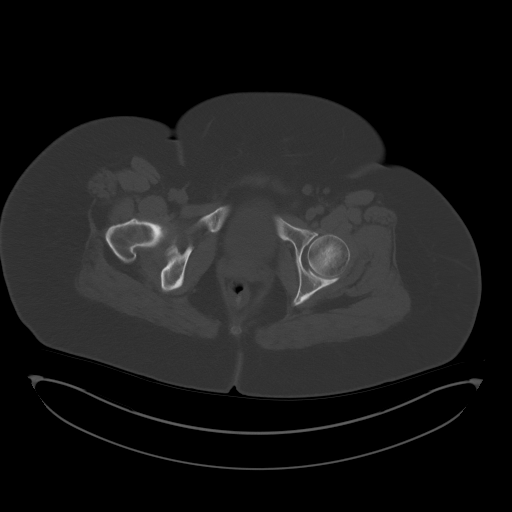
[im 31/92  soft-tissue]
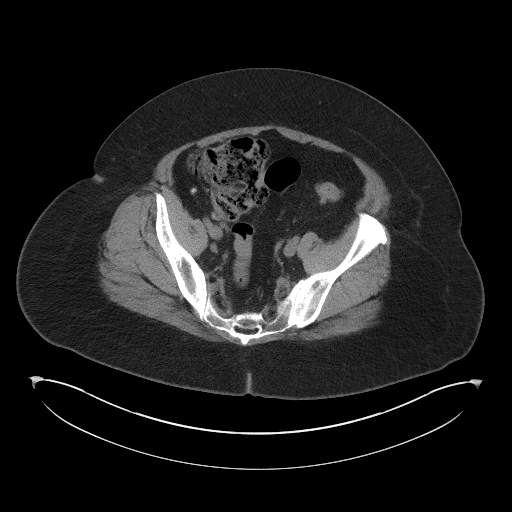
[im 31/92  lung]
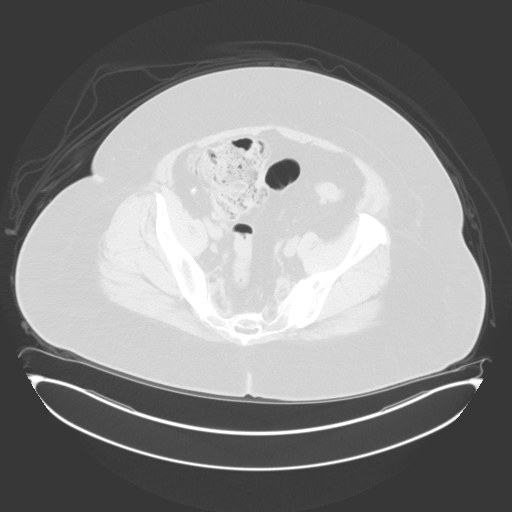
[im 46/92  soft-tissue]
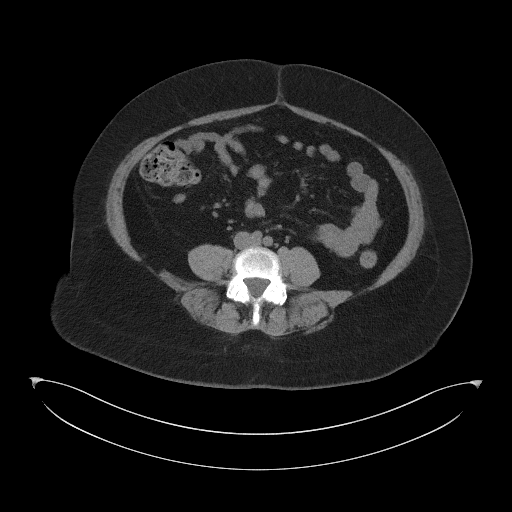
[im 46/92  lung]
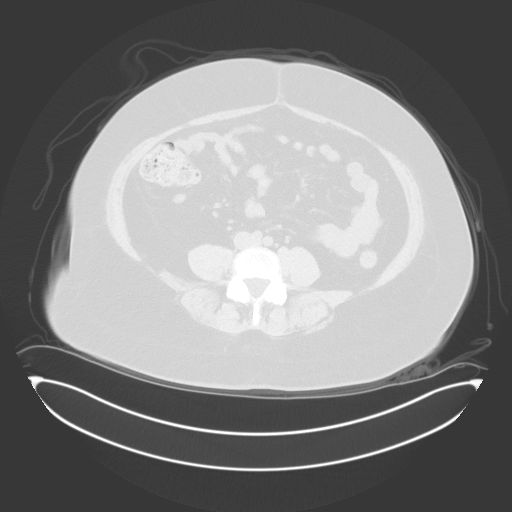
[im 61/92  soft-tissue]
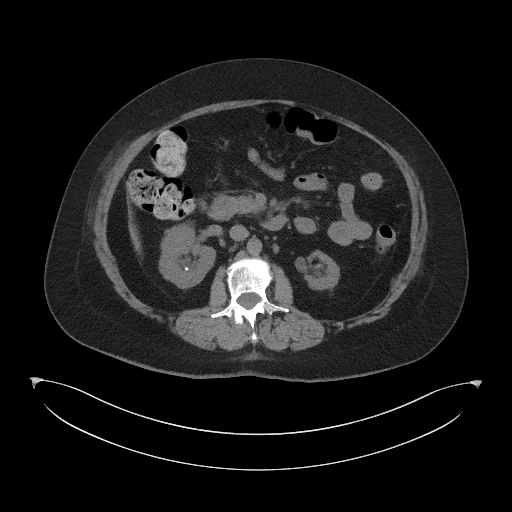
[im 61/92  lung]
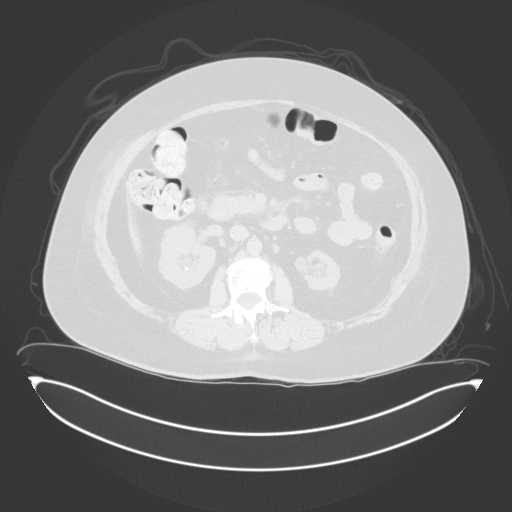
[im 76/92  soft-tissue]
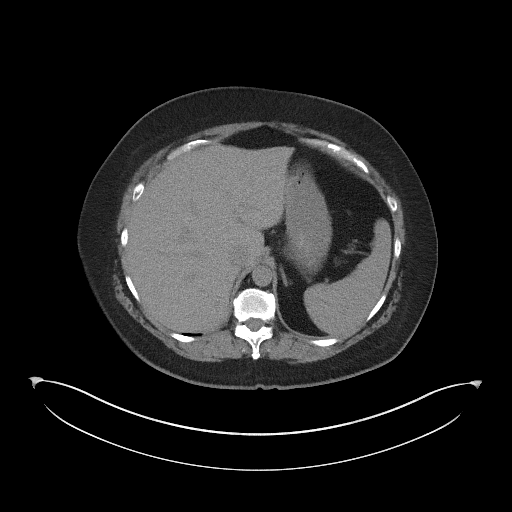
[im 76/92  lung]
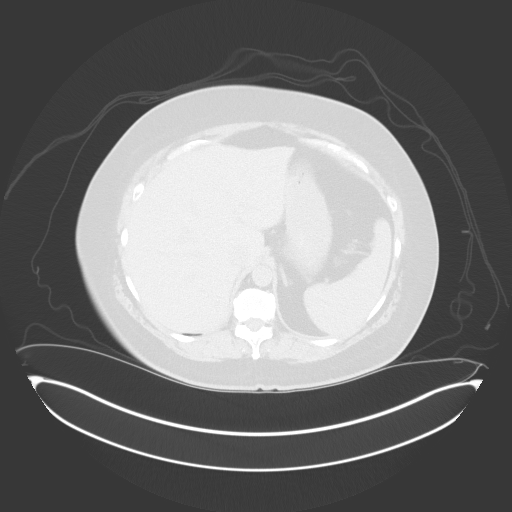

[Series 5: coronal pre · coronal · non-contrast · 0.90mm/px · 2 of 115 slices shown, 3 images]
[im 39/115  soft-tissue]
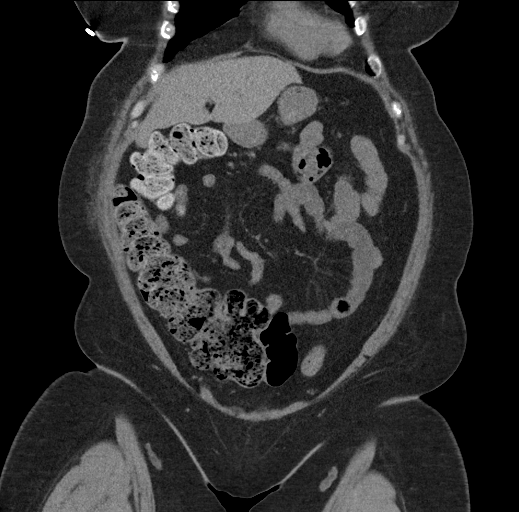
[im 39/115  bone]
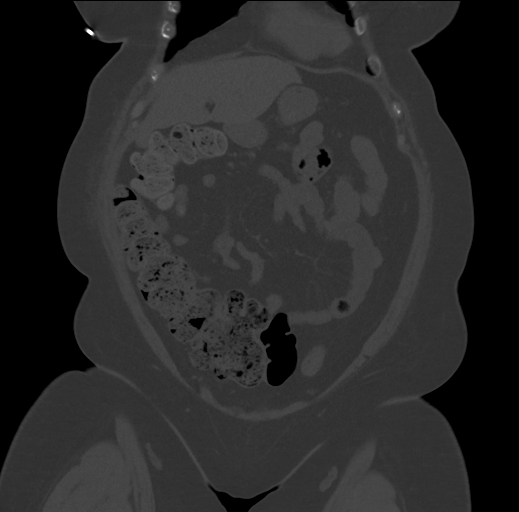
[im 77/115  soft-tissue]
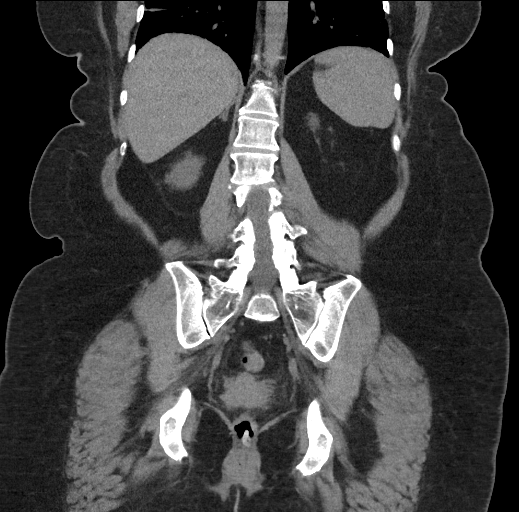

[Series 7: axial post · axial · 0.97mm/px · z∈[+892,+1192]mm · 5 of 92 slices shown]
[im 16/92  soft-tissue]
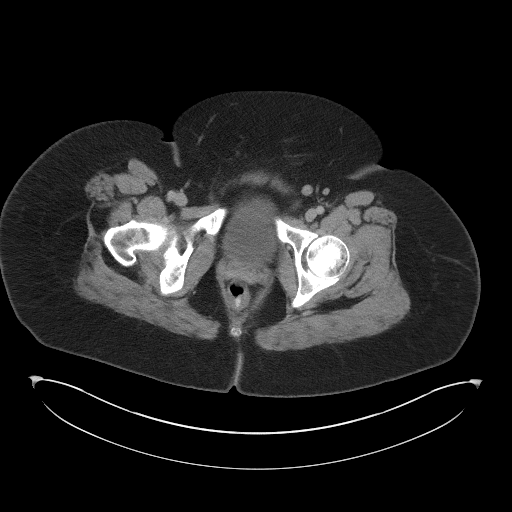
[im 31/92  soft-tissue]
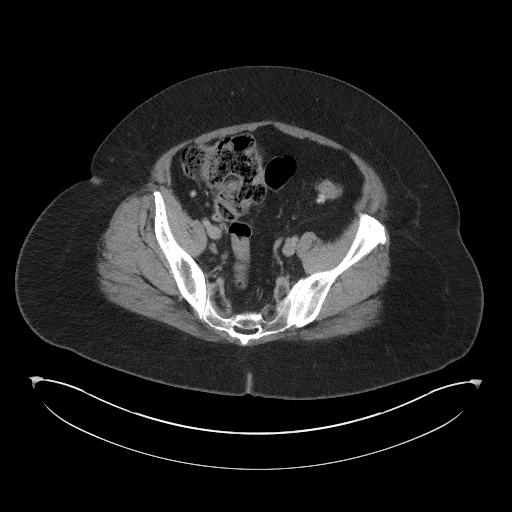
[im 46/92  soft-tissue]
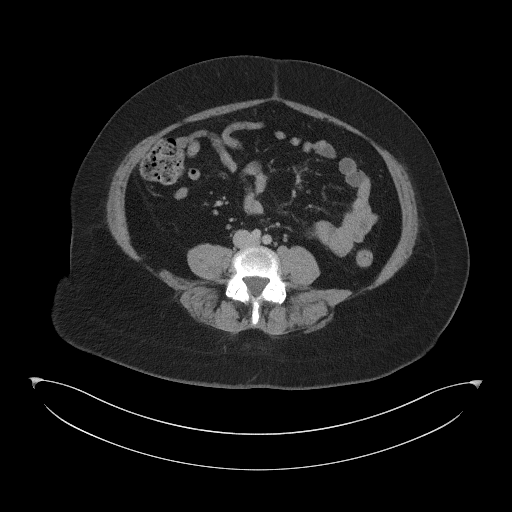
[im 61/92  soft-tissue]
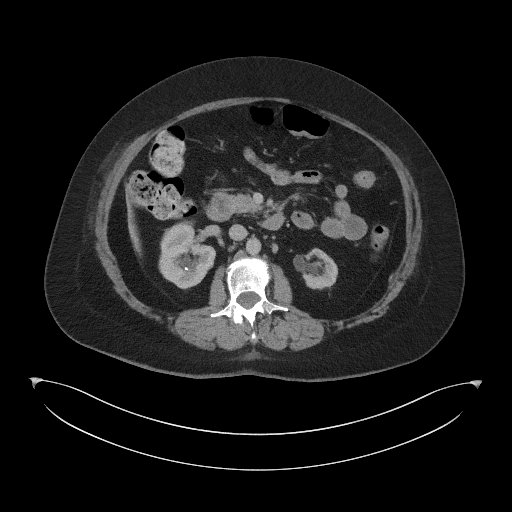
[im 76/92  soft-tissue]
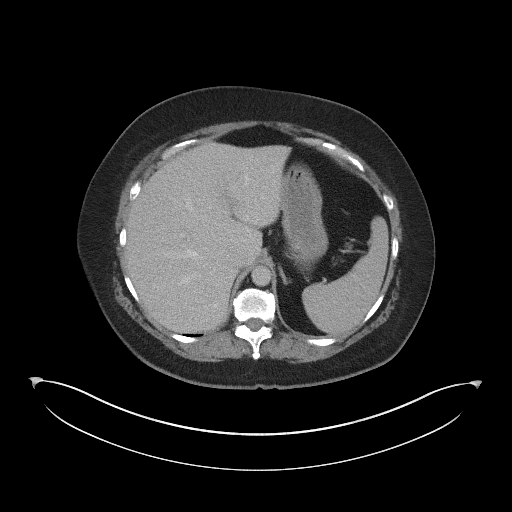

[12 of 46 positions shown; findings below may reference images not displayed]

RADIATION DOSE REDUCTION: This exam was performed according to the
departmental dose-optimization program which includes automated
exposure control, adjustment of the mA and/or kV according to
patient size and/or use of iterative reconstruction technique.

CONTRAST:  75mL OMNIPAQUE IOHEXOL 300 MG/ML  SOLN
FINDINGS: Lower chest: No acute abnormality. Bandlike scarring of the
bilateral lung bases.

Hepatobiliary: No focal liver abnormality is seen. Status post
cholecystectomy. No biliary dilatation.

Pancreas: Unremarkable. No pancreatic ductal dilatation or
surrounding inflammatory changes.

Spleen: Normal in size without significant abnormality.

Adrenals/Urinary Tract: Adrenal glands are unremarkable. Multiple
small bilateral nonobstructive renal calculi. No ureteral calculi or
hydronephrosis. Relatively atrophic, lobular appearance of the left
kidney. No suspicious mass or contrast enhancement. No urinary tract
filling defect on delayed phase imaging. Bladder is unremarkable.

Stomach/Bowel: Stomach is within normal limits. Appendix appears
normal. No evidence of bowel wall thickening, distention, or
inflammatory changes.

Vascular/Lymphatic: No significant vascular findings are present. No
enlarged abdominal or pelvic lymph nodes.

Reproductive: No mass or other significant abnormality.

Other: No abdominal wall hernia or abnormality. No ascites.

Musculoskeletal: No acute or significant osseous findings.
IMPRESSION: 1. Multiple small bilateral nonobstructive renal calculi. No
ureteral calculi or hydronephrosis.
2. Relatively atrophic, lobular appearance of the left kidney, in
keeping with prior obstructive, infectious, or ischemic insult.
3. No suspicious mass or contrast enhancement. No urinary tract
filling defect on delayed phase imaging.
4. Normal bladder.

## 2023-07-30 NOTE — Patient Instructions (Signed)
 Visit Information  Thank you for taking time to visit with me today. Please don't hesitate to contact me if I can be of assistance to you.   Following are the goals we discussed today:   Goals Addressed   None     Our next appointment is no further scheduled appointments.  on as needed at -  Please call the care guide team at [phone] if you need to cancel or reschedule your appointment.   If you are experiencing a Mental Health or Behavioral Health Crisis or need someone to talk to, please call the Suicide and Crisis Lifeline: 988 call the [place] National Suicide Prevention Lifeline: (220)556-1586 or TTY: (413) 344-4228 TTY 516-142-5968) to talk to a trained counselor call 1-800-273-TALK (toll free, 24 hour hotline) call the [facility]: [phone] call 911   Patient verbalizes understanding of instructions and care plan provided today and agrees to view in MyChart. Active MyChart status and patient understanding of how to access instructions and care plan via MyChart confirmed with patient.     The patient has been provided with contact information for the care management team and has been advised to call with any health related questions or concerns.    Symphanie Cederberg L. Noelle Penner, RN, BSN, CCM [place]  [facility], [facility] RN Care Manager Direct Dial: [phone]  Fax: [phone]

## 2023-07-30 NOTE — Patient Outreach (Signed)
 Care Coordination   Follow Up Visit Note   09/05/2023 updated note for 07/30/23 Name: [name] MRN: [id number] DOB: [birth year]  Sabrina Blower Behnken is a 53 y.o. year old female who sees Anabel Halon, MD for primary care. I spoke with  [name] by phone today.  What matters to the patients health and wellness today?  GYN  seen, memory not change, denies blood pressure nor kidney concerns Review of her GYN & neurology exams,  Discussion on pending care with Rosann Auerbach, clinical neuropsychologist Patient is not taking depo any more Followed by neurologist Patietn takes her step sister with when sees neurologist  Had skin cancer spots on face removed, go back in a few weeks to look at more  No more falls since April 2024 per patient  She does not recall hitting her head Last few months had about 3 in January to February 2025  Memory is about the same Sequel lower 400 to 200 mg then put back on 400 mg    Goals Addressed               This Visit's Progress     Patient Stated     see GYN related to Depo-Provera side effects (RN CM) (pt-stated)   On track      Patient will receive an answer related to testing for Depo use from GYN 07/30/23 pt to discuss with OBGYN, [facility]ure Interventions Today    Flowsheet Row Most Recent Value  Chronic Disease   Chronic disease during today's visit Hypertension (HTN), Other, Chronic Kidney Disease/End Stage Renal Disease (ESRD)  [GYN  seen, memory not change, denies blood pressure nor kidney concerns]  General Interventions   General Interventions Discussed/Reviewed General Interventions Reviewed, Doctor Visits  Doctor Visits Discussed/Reviewed Doctor Visits Reviewed, PCP, Specialist  PCP/Specialist Visits Compliance with follow-up visit  Education Interventions   Education Provided Provided Education  Provided Verbal Education On Mental Health/Coping with Illness, Medication, When to see the doctor  [memory changes, skin cancer, falls]   Mental Health Interventions   Mental Health Discussed/Reviewed Mental Health Reviewed, Coping Strategies, Other  [memory is about the same]  Pharmacy Interventions   Pharmacy Dicussed/Reviewed Pharmacy Topics Reviewed, Affording Medications, Medications and their functions  Safety Interventions   Safety Discussed/Reviewed Safety Reviewed, Fall Risk, Home Safety  Home Safety Assistive Devices              SDOH assessments and interventions completed:  No     Care Coordination Interventions:  Yes, provided   Follow up plan: No further intervention required.   Encounter Outcome:  Patient Visit Completed    [name], RN, BSN, CCM [place]  [facility], [facility] RN Care Manager Direct Dial: [phone]  Fax: [phone] Mailing Address: [address]  [place] [address] Website: [email or web address]

## 2023-08-06 DIAGNOSIS — L304 Erythema intertrigo: Secondary | ICD-10-CM | POA: Diagnosis not present

## 2023-08-06 DIAGNOSIS — L821 Other seborrheic keratosis: Secondary | ICD-10-CM | POA: Diagnosis not present

## 2023-08-06 DIAGNOSIS — B078 Other viral warts: Secondary | ICD-10-CM | POA: Diagnosis not present

## 2023-08-06 DIAGNOSIS — C44311 Basal cell carcinoma of skin of nose: Secondary | ICD-10-CM | POA: Diagnosis not present

## 2023-09-15 ENCOUNTER — Other Ambulatory Visit: Payer: Self-pay | Admitting: Urology

## 2023-09-24 ENCOUNTER — Ambulatory Visit: Payer: Self-pay | Admitting: Internal Medicine

## 2023-09-24 ENCOUNTER — Encounter: Payer: Self-pay | Admitting: Internal Medicine

## 2023-09-24 VITALS — BP 127/82 | HR 98 | Ht 62.0 in | Wt 232.6 lb

## 2023-09-24 DIAGNOSIS — M25562 Pain in left knee: Secondary | ICD-10-CM

## 2023-09-24 DIAGNOSIS — M25561 Pain in right knee: Secondary | ICD-10-CM | POA: Diagnosis not present

## 2023-09-24 DIAGNOSIS — F3170 Bipolar disorder, currently in remission, most recent episode unspecified: Secondary | ICD-10-CM

## 2023-09-24 DIAGNOSIS — R4189 Other symptoms and signs involving cognitive functions and awareness: Secondary | ICD-10-CM | POA: Insufficient documentation

## 2023-09-24 DIAGNOSIS — I10 Essential (primary) hypertension: Secondary | ICD-10-CM

## 2023-09-24 DIAGNOSIS — R413 Other amnesia: Secondary | ICD-10-CM

## 2023-09-24 DIAGNOSIS — M25511 Pain in right shoulder: Secondary | ICD-10-CM

## 2023-09-24 DIAGNOSIS — G8929 Other chronic pain: Secondary | ICD-10-CM | POA: Insufficient documentation

## 2023-09-24 DIAGNOSIS — Z1211 Encounter for screening for malignant neoplasm of colon: Secondary | ICD-10-CM

## 2023-09-24 DIAGNOSIS — N1832 Chronic kidney disease, stage 3b: Secondary | ICD-10-CM

## 2023-09-24 DIAGNOSIS — M25512 Pain in left shoulder: Secondary | ICD-10-CM

## 2023-09-24 DIAGNOSIS — Z23 Encounter for immunization: Secondary | ICD-10-CM | POA: Diagnosis not present

## 2023-09-24 DIAGNOSIS — G2401 Drug induced subacute dyskinesia: Secondary | ICD-10-CM | POA: Diagnosis not present

## 2023-09-24 HISTORY — DX: Other chronic pain: G89.29

## 2023-09-24 MED ORDER — PHENTERMINE HCL 37.5 MG PO TABS
37.5000 mg | ORAL_TABLET | Freq: Every day | ORAL | 2 refills | Status: DC
Start: 2023-09-24 — End: 2023-12-24

## 2023-09-24 NOTE — Assessment & Plan Note (Signed)
 Has short-term memory deficit and episodes of confusion Has been evaluated by neurology in the past, had MRI of brain and EEG, which were unremarkable Referred to neurology again as per patient request

## 2023-09-24 NOTE — Assessment & Plan Note (Signed)
 Followed by psychiatry On Abilify , doxepin , Lamictal  and Seroquel  Takes Xanax  as needed for anxiet

## 2023-09-24 NOTE — Assessment & Plan Note (Signed)
 BMI Readings from Last 3 Encounters:  09/24/23 42.54 kg/m  06/19/23 41.59 kg/m  06/15/23 41.15 kg/m   Advised to follow low-carb diet and perform moderate exercise/walking at least 150 minutes/week Discussed about different medical weight loss options-would benefit from GLP-1 agonist therapy, but cost can be a limiting factor Has tried topiramate and Contrave in the past, would avoid topiramate due to history of nephrolithiasis Started phentermine -initial BMI: 42.54

## 2023-09-24 NOTE — Assessment & Plan Note (Signed)
Likely due to uncontrolled HTN in the past Followed by Nephrology - last visit note reviewed On Losartan DC Chlorthalidone due to concern for orthostatic hypotension/ dehydration Avoid nephrotoxic agents

## 2023-09-24 NOTE — Assessment & Plan Note (Signed)
 Likely due to antipsychotics-on Abilify  and Seroquel  She is already on Cogentin - symptoms improved now

## 2023-09-24 NOTE — Progress Notes (Signed)
 Established Patient Office Visit  Subjective:  Patient ID: [name], female    DOB: [birth year]  Age: 53 y.o. MRN: [id number]  CC:  Chief Complaint  Patient presents with   Medical Management of Chronic Issues    4 month f/u, reports knee pain several weeks also shoulder pain. Has concerns about bone cancer due to family hx.     HPI [name] is a 53 y.o. female with past medical history of HTN, CKD stage III, bipolar disorder and nephrolithiasis who presents for f/u of her chronic medical conditions.  HTN: BP was well controlled today.  She takes amlodipine , losartan , and carvedilol  currently.  She has seen improvement in dizziness since stopping chlorthalidone  and clonidine .  Denies any headache, chest pain or palpitations.  Tardive dyskinesia: She had constant twitching of the mouth and the lower jaw.  Of note, she is currently on Abilify  5 mg nightly and Seroquel  400 mg nightly.  She is benztropine instead of Ingrezza  now for extrapyramidal side effects of antipsychotics, which has improved her symptoms.  She has seen neurology for it.  She also has memory concerns, which are likely due to her psychiatric medicines.  Bilateral shoulder and knee pain: She reports chronic bilateral shoulder pain, which is dull, radiating towards upper arm.  She went to [facility] a week ago, and had acute worsening of right shoulder pain.  Denies any recent injury or heavy lifting.  She also reports bilateral knee pain, right more than left, worse with prolonged sitting.  She is worried as her mother had a bone cancer/metastasis.  Recurrent falls: She has h/o recurrent falls, but frequency has decreased now.  Does not report any fall since the last visit.  She has seen neurologist, and has had MRI of the brain and cervical spine, which were unremarkable to suggest any specific etiology for falls.  She reports that she had change in mental status for few seconds before the fall and remains confused  after the fall as well.  Denies any episode of shaking.  She also had EEG, which was also unremarkable.  She reports chronic leg weakness.  CKD:  Followed by Nephrology.  Denies any dysuria, hematuria or urinary hesitancy or resistance.   Recurrent nephrolithiasis: She had nephrolithotomy in 03/24. She currently denies any dysuria, hematuria, flank pain, fever, chills or nausea/vomiting.  Morbid obesity: She is struggling to lose weight.  She agrees to follow low-carb diet.  She admits that she eats that in frequent intervals and does not like healthier food such as vegetables and fruits, but is willing to make adjustments.  Her exercise capacity is limited due to chronic knee pain.  Past Medical History:  Diagnosis Date   Anxiety    Chronic kidney disease    CKD (chronic kidney disease) stage 3, GFR 30-59 ml/min (HCC) 03/15/2019   Depression    Diarrhea 05/04/2017   Essential hypertension, benign 03/15/2019   History of kidney stones    Hypertension    Hypothyroidism    Hypothyroidism, adult 03/15/2019   Kidney stones    LGSIL on Pap smear of cervix 08/17/2020   Needs colpo per [facility] immediate risk is 4.3 for CIN 3+risk    Obesity (BMI 30-39.9) 03/15/2019   Renal disorder    Vitamin D  deficiency disease 03/15/2019    Past Surgical History:  Procedure Laterality Date   BIOPSY  06/05/2017   Procedure: BIOPSY;  Surgeon: Ruby Corporal, MD;  [facility];  Service: Endoscopy;;  colon   CHOLECYSTECTOMY     COLONOSCOPY N/A 06/05/2017   Procedure: COLONOSCOPY;  Surgeon: Ruby Corporal, MD;  [facility];  Service: Endoscopy;  Laterality: N/A;  8:30   CYSTOSCOPY WITH RETROGRADE PYELOGRAM, URETEROSCOPY AND STENT PLACEMENT Left 10/04/2020   Procedure: CYSTOSCOPY WITH LEFT RETROGRADE PYELOGRAM, DIAGNOSTIC LEFT URETEROSCOPY AND LEFT URETEREAL STENT PLACEMENT;  Surgeon: Marco Severs, MD;  Location: [facility];  Service: Urology;  Laterality: Left;   CYSTOSCOPY WITH  RETROGRADE PYELOGRAM, URETEROSCOPY AND STENT PLACEMENT Left 10/16/2020   Procedure: CYSTOSCOPY WITH LEFT RETROGRADE PYELOGRAM, LEFT URETEROSCOPY  WITH LASER AND LEFT URETERAL STENT EXCHANGE;  Surgeon: Marco Severs, MD;  Location: [facility];  Service: Urology;  Laterality: Left;   CYSTOSCOPY WITH RETROGRADE PYELOGRAM, URETEROSCOPY AND STENT PLACEMENT Bilateral 02/17/2022   Procedure: CYSTOSCOPY WITH RETROGRADE PYELOGRAM, URETEROSCOPY AND STENT PLACEMENT;  Surgeon: Marco Severs, MD;  Location: [facility];  Service: Urology;  Laterality: Bilateral;   CYSTOSCOPY WITH RETROGRADE PYELOGRAM, URETEROSCOPY AND STENT PLACEMENT Left 06/26/2022   Procedure: CYSTOSCOPY WITH RETROGRADE PYELOGRAM, URETEROSCOPY AND STENT PLACEMENT;  Surgeon: Marco Severs, MD;  Location: [facility];  Service: Urology;  Laterality: Left;   HOLMIUM LASER APPLICATION Left 10/16/2020   Procedure: HOLMIUM LASER APPLICATION;  Surgeon: Marco Severs, MD;  Location: [facility];  Service: Urology;  Laterality: Left;   HOLMIUM LASER APPLICATION Left 06/26/2022   Procedure: HOLMIUM LASER APPLICATION;  Surgeon: Marco Severs, MD;  Location: [facility];  Service: Urology;  Laterality: Left;   IR  NEPHROURETERAL CATH PLACE LEFT  08/05/2022   KIDNEY STONE SURGERY     kidney stones     LASER ABLATION CONDOLAMATA N/A 10/31/2020   Procedure: LASER ABLATION OF THE CERVIX;  Surgeon: Wendelyn Halter, MD;  Location: [facility];  Service: Gynecology;  Laterality: N/A;   NEPHROLITHOTOMY Left 08/07/2022   Procedure: NEPHROLITHOTOMY PERCUTANEOUS;  Surgeon: Marco Severs, MD;  Location: [facility];  Service: Urology;  Laterality: Left;    Family History  Problem Relation Age of Onset   Cancer Paternal Grandmother    Heart failure Paternal Grandmother    Cancer Maternal Grandmother    Cancer Father    Heart failure Father    Lung cancer Mother    Colon cancer Neg Hx     Social History   Socioeconomic History   Marital status: Divorced     Spouse name: Not on file   Number of children: Not on file   Years of education: Not on file   Highest education level: Not on file  Occupational History   Not on file  Tobacco Use   Smoking status: Every Day    Current packs/day: 0.00    Average packs/day: 0.5 packs/day for 20.0 years (10.0 ttl pk-yrs)    Types: Cigarettes    Start date: 02/08/2002    Last attempt to quit: 02/08/2022    Years since quitting: 1.6   Smokeless tobacco: Never  Vaping Use   Vaping status: Never Used  Substance and Sexual Activity   Alcohol use: No   Drug use: No   Sexual activity: Not Currently    Birth control/protection: Injection  Other Topics Concern   Not on file  Social History Narrative   Divorced twice,1st lasted 5 years,2nd 4 years.On disabilty secondary to mental illness since 2004.Previously used to [employer].Lives alone,has 2 cats.cright handed    Are you right handed or left handed? right   Are  you currently employed ? no   What is your current occupation?   Do you live at home alone?no   Who lives with you? dad   What type of home do you live in: 1 story or 2 story? one    Caffeine 2 cans of soda   Social Drivers of Health   Financial Resource Strain: Low Risk  (07/21/2022)   Overall Financial Resource Strain (CARDIA)    Difficulty of Paying Living Expenses: Not very hard  Food Insecurity: No Food Insecurity (08/07/2022)   Hunger Vital Sign    Worried About Running Out of Food in the Last Year: Never true    Ran Out of Food in the Last Year: Never true  Transportation Needs: No Transportation Needs (08/07/2022)   PRAPARE - Administrator, Civil Service (Medical): No    Lack of Transportation (Non-Medical): No  Physical Activity: Insufficiently Active (08/06/2020)   Exercise Vital Sign    Days of Exercise per Week: 2 days    Minutes of Exercise per Session: 10 min  Stress: No Stress Concern Present (07/21/2022)   [employer] of Occupational Health -  Occupational Stress Questionnaire    Feeling of Stress : Only a little  Social Connections: Socially Isolated (07/21/2022)   Social Connection and Isolation Panel [NHANES]    Frequency of Communication with Friends and Family: More than three times a week    Frequency of Social Gatherings with Friends and Family: Three times a week    Attends Religious Services: Never    Active Member of Clubs or Organizations: No    Attends Banker Meetings: Never    Marital Status: Divorced  Catering manager Violence: Not At Risk (08/07/2022)   Humiliation, Afraid, Rape, and Kick questionnaire    Fear of Current or Ex-Partner: No    Emotionally Abused: No    Physically Abused: No    Sexually Abused: No    Outpatient Medications Prior to Visit  Medication Sig Dispense Refill   ALPRAZolam  (XANAX ) 1 MG tablet Take 1 mg by mouth 2 (two) times daily as needed for anxiety. 04/17/23 Patient reports xanax  changed from tid to bid     amLODipine  (NORVASC ) 5 MG tablet TAKE 1 TABLET BY MOUTH DAILY 30 tablet 5   ARIPiprazole  (ABILIFY ) 5 MG tablet Take 5 mg by mouth at bedtime.     atorvastatin  (LIPITOR) 20 MG tablet TAKE 1 TABLET BY MOUTH EVERY DAY 90 tablet 3   benztropine (COGENTIN) 1 MG tablet Take 1 mg by mouth 2 (two) times daily.     carvedilol  (COREG ) 25 MG tablet TAKE 1 TABLET BY MOUTH TWICE DAILY 180 tablet 2   Cholecalciferol  (VITAMIN D3) 125 MCG (5000 UT) TABS Take 10,000 Units by mouth daily.     doxepin  (SINEQUAN ) 75 MG capsule Take 225 mg by mouth at bedtime.      lamoTRIgine  (LAMICTAL ) 200 MG tablet Take 200 mg by mouth 2 (two) times daily.     losartan  (COZAAR ) 25 MG tablet Take 25 mg by mouth in the morning.     nitrofurantoin  (MACRODANTIN ) 50 MG capsule Take 50 mg by mouth 4 (four) times daily.     potassium citrate  (UROCIT-K ) 10 MEQ (1080 MG) SR tablet Take 20 mEq by mouth in the morning and at bedtime.     QUEtiapine  (SEROQUEL  XR) 200 MG 24 hr tablet Take 400 mg by mouth at bedtime.      nitrofurantoin  (MACRODANTIN ) 50 MG capsule TAKE  ONE CAPSULE BY MOUTH AT BEDTIME 30 capsule 11   No facility-administered medications prior to visit.    Allergies  Allergen Reactions   Morphine And Codeine  Itching and Hives   Sulfa Antibiotics Swelling   Tape Itching and Other (See Comments)    Surgical     ROS Review of Systems  Constitutional:  Negative for chills and fever.  HENT:  Negative for congestion, sinus pressure, sinus pain and sore throat.   Eyes:  Negative for pain and discharge.  Respiratory:  Negative for cough and shortness of breath.   Cardiovascular:  Negative for chest pain and palpitations.  Gastrointestinal:  Negative for abdominal pain, diarrhea, nausea and vomiting.  Endocrine: Negative for polydipsia and polyuria.  Genitourinary:  Negative for dysuria and hematuria.  Musculoskeletal:  Positive for arthralgias. Negative for neck pain and neck stiffness.  Skin:  Negative for rash.  Neurological:  Negative for dizziness and weakness.  Psychiatric/Behavioral:  Positive for confusion and dysphoric mood. Negative for agitation and behavioral problems. The patient is nervous/anxious.       Objective:    Physical Exam Vitals reviewed.  Constitutional:      General: She is not in acute distress.    Appearance: She is not diaphoretic.  HENT:     Head: Normocephalic and atraumatic.     Nose: Nose normal.     Mouth/Throat:     Mouth: Mucous membranes are moist.  Eyes:     General: No scleral icterus.    Extraocular Movements: Extraocular movements intact.  Cardiovascular:     Rate and Rhythm: Normal rate and regular rhythm.     Heart sounds: Normal heart sounds. No murmur heard. Pulmonary:     Breath sounds: Normal breath sounds. No wheezing or rales.  Musculoskeletal:     Right shoulder: Tenderness and crepitus present. No swelling.     Left shoulder: No swelling.     Cervical back: Neck supple. No tenderness.     Right knee: Swelling present.  No tenderness.     Left knee: Swelling present. No tenderness.     Right lower leg: No edema.     Left lower leg: No edema.     Comments: Painful ROM over bilateral shoulders  Skin:    General: Skin is warm.     Findings: No rash.  Neurological:     General: No focal deficit present.     Mental Status: She is alert and oriented to person, place, and time.     Sensory: No sensory deficit.     Motor: Weakness (B/l LE - 4/5) present.  Psychiatric:        Mood and Affect: Mood normal.        Behavior: Behavior normal.     BP 127/82   Pulse 98   Ht 5\' 2"  (1.575 m)   Wt 232 lb 9.6 oz (105.5 kg)   SpO2 93%   BMI 42.54 kg/m  Wt Readings from Last 3 Encounters:  09/24/23 232 lb 9.6 oz (105.5 kg)  06/19/23 227 lb 6.4 oz (103.1 kg)  06/15/23 225 lb (102.1 kg)    Lab Results  Component Value Date   TSH 2.27 02/09/2023   Lab Results  Component Value Date   WBC 8.8 08/11/2022   HGB 12.8 08/11/2022   HCT 38.9 08/11/2022   MCV 91.7 08/11/2022   PLT 324 08/11/2022   Lab Results  Component Value Date   NA 139 08/11/2022   K 3.3 (L)  08/11/2022   CO2 26 08/11/2022   GLUCOSE 82 08/11/2022   BUN 16 08/11/2022   CREATININE 1.46 (H) 08/11/2022   BILITOT 0.6 08/11/2022   ALKPHOS 146 (H) 08/11/2022   AST 28 08/11/2022   ALT 23 08/11/2022   PROT 7.1 08/11/2022   ALBUMIN 3.6 08/11/2022   CALCIUM  9.1 08/11/2022   ANIONGAP 12 08/11/2022   Lab Results  Component Value Date   CHOL 244 (H) 09/04/2021   Lab Results  Component Value Date   HDL 44 (L) 09/04/2021   Lab Results  Component Value Date   LDLCALC 161 (H) 09/04/2021   Lab Results  Component Value Date   TRIG 218 (H) 09/04/2021   Lab Results  Component Value Date   CHOLHDL 5.5 (H) 09/04/2021   Lab Results  Component Value Date   HGBA1C 5.2 09/04/2021      Assessment & Plan:   Problem List Items Addressed This Visit       Cardiovascular and Mediastinum   Essential hypertension - Primary   BP Readings  from Last 1 Encounters:  09/24/23 127/82   Well-controlled with amlodipine  5 mg QD, Losartan  25 mg once daily and Coreg  25 mg BID Followed by Nephrology Episodes of dizziness could be due to dehydration and orthostatic hypotension - improved since discontinuing chlorthalidone  in the last visit, had previously stopped clonidine  Needs to improve hydration Counseled for compliance with the medications Advised DASH diet and moderate exercise/walking, at least 150 mins/week        Nervous and Auditory   Tardive dyskinesia   Likely due to antipsychotics-on Abilify  and Seroquel  She is already on Cogentin - symptoms improved now        Genitourinary   CKD (chronic kidney disease) stage 3, GFR 30-59 ml/min (HCC)   Likely due to uncontrolled HTN in the past Followed by Nephrology - last visit note reviewed On Losartan  DC Chlorthalidone  due to concern for orthostatic hypotension/ dehydration Avoid nephrotoxic agents        Other   Bipolar disorder in partial remission (HCC)   Followed by psychiatry On Abilify , doxepin , Lamictal  and Seroquel  Takes Xanax  as needed for anxiet      Memory deficit   Has short-term memory deficit and episodes of confusion Has been evaluated by neurology in the past, had MRI of brain and EEG, which were unremarkable Referred to neurology again as per patient request      Relevant Orders   Ambulatory referral to Neurology   Morbid obesity (HCC)   BMI Readings from Last 3 Encounters:  09/24/23 42.54 kg/m  06/19/23 41.59 kg/m  06/15/23 41.15 kg/m   Advised to follow low-carb diet and perform moderate exercise/walking at least 150 minutes/week Discussed about different medical weight loss options-would benefit from GLP-1 agonist therapy, but cost can be a limiting factor Has tried topiramate and Contrave in the past, would avoid topiramate due to history of nephrolithiasis Started phentermine -initial BMI: 42.54      Relevant Medications    phentermine  (ADIPEX-P ) 37.5 MG tablet   Chronic pain of both shoulders   Unclear etiology, but could be rotator cuff tendinopathy Avoid heavy lifting Check x-ray of shoulders Tylenol  arthritis as needed for pain Would avoid oral NSAIDs due to CKD If persistent concern, will refer to orthopedic surgery      Relevant Orders   DG Shoulder Left   DG Shoulder Right   Chronic pain of both knees   Unclear etiology, but could be early onset OA/degenerative changes  Needs to slowly increase physical activity Check x-ray of knee Tylenol  arthritis as needed for pain If persistent concern, will refer to orthopedic surgery      Relevant Orders   DG Knee Complete 4 Views Right   DG Knee Complete 4 Views Left   Other Visit Diagnoses       Colon cancer screening       Relevant Orders   Ambulatory referral to Gastroenterology     Encounter for immunization       Relevant Orders   Pneumococcal conjugate vaccine 20-valent (Completed)         Meds ordered this encounter  Medications   phentermine  (ADIPEX-P ) 37.5 MG tablet    Sig: Take 1 tablet (37.5 mg total) by mouth daily before breakfast.    Dispense:  30 tablet    Refill:  2    Follow-up: Return in about 3 months (around 12/24/2023) for HTN and weight management.    Meldon Sport, MD

## 2023-09-24 NOTE — Assessment & Plan Note (Signed)
 Unclear etiology, but could be early onset OA/degenerative changes Needs to slowly increase physical activity Check x-ray of knee Tylenol  arthritis as needed for pain If persistent concern, will refer to orthopedic surgery

## 2023-09-24 NOTE — Patient Instructions (Addendum)
 Please start taking Phentermine  half tablet for 2 weeks and then once daily.  Please get x-rays of shoulder and knee at [facility].  Please continue taking other medications as prescribed.  Please follow low carb diet and perform moderate exercise/walking at least 150 mins/week.

## 2023-09-24 NOTE — Assessment & Plan Note (Signed)
 BP Readings from Last 1 Encounters:  09/24/23 127/82   Well-controlled with amlodipine  5 mg QD, Losartan  25 mg once daily and Coreg  25 mg BID Followed by Nephrology Episodes of dizziness could be due to dehydration and orthostatic hypotension - improved since discontinuing chlorthalidone  in the last visit, had previously stopped clonidine  Needs to improve hydration Counseled for compliance with the medications Advised DASH diet and moderate exercise/walking, at least 150 mins/week

## 2023-09-24 NOTE — Assessment & Plan Note (Addendum)
 Unclear etiology, but could be rotator cuff tendinopathy Avoid heavy lifting Check x-ray of shoulders Tylenol  arthritis as needed for pain Would avoid oral NSAIDs due to CKD If persistent concern, will refer to orthopedic surgery

## 2023-09-29 ENCOUNTER — Ambulatory Visit (HOSPITAL_COMMUNITY)
Admission: RE | Admit: 2023-09-29 | Discharge: 2023-09-29 | Disposition: A | Discharge status: Home / Self Care | Source: Ambulatory Visit | Attending: Internal Medicine | Admitting: Internal Medicine

## 2023-09-29 DIAGNOSIS — M25512 Pain in left shoulder: Secondary | ICD-10-CM | POA: Diagnosis not present

## 2023-09-29 DIAGNOSIS — E211 Secondary hyperparathyroidism, not elsewhere classified: Secondary | ICD-10-CM | POA: Diagnosis not present

## 2023-09-29 DIAGNOSIS — D631 Anemia in chronic kidney disease: Secondary | ICD-10-CM | POA: Diagnosis not present

## 2023-09-29 DIAGNOSIS — M25561 Pain in right knee: Secondary | ICD-10-CM | POA: Diagnosis not present

## 2023-09-29 DIAGNOSIS — G8929 Other chronic pain: Secondary | ICD-10-CM | POA: Insufficient documentation

## 2023-09-29 DIAGNOSIS — M25511 Pain in right shoulder: Secondary | ICD-10-CM | POA: Insufficient documentation

## 2023-09-29 DIAGNOSIS — E559 Vitamin D deficiency, unspecified: Secondary | ICD-10-CM | POA: Diagnosis not present

## 2023-09-29 DIAGNOSIS — M170 Bilateral primary osteoarthritis of knee: Secondary | ICD-10-CM | POA: Diagnosis not present

## 2023-09-29 DIAGNOSIS — M25562 Pain in left knee: Secondary | ICD-10-CM | POA: Insufficient documentation

## 2023-09-29 DIAGNOSIS — R809 Proteinuria, unspecified: Secondary | ICD-10-CM | POA: Diagnosis not present

## 2023-09-29 DIAGNOSIS — N189 Chronic kidney disease, unspecified: Secondary | ICD-10-CM | POA: Diagnosis not present

## 2023-09-30 ENCOUNTER — Encounter (INDEPENDENT_AMBULATORY_CARE_PROVIDER_SITE_OTHER): Payer: Self-pay | Admitting: *Deleted

## 2023-10-06 ENCOUNTER — Encounter: Payer: Self-pay | Admitting: Internal Medicine

## 2023-10-08 DIAGNOSIS — F3175 Bipolar disorder, in partial remission, most recent episode depressed: Secondary | ICD-10-CM | POA: Diagnosis not present

## 2023-10-12 DIAGNOSIS — N1831 Chronic kidney disease, stage 3a: Secondary | ICD-10-CM | POA: Diagnosis not present

## 2023-10-12 DIAGNOSIS — E876 Hypokalemia: Secondary | ICD-10-CM | POA: Diagnosis not present

## 2023-10-12 DIAGNOSIS — R809 Proteinuria, unspecified: Secondary | ICD-10-CM | POA: Diagnosis not present

## 2023-10-12 DIAGNOSIS — N200 Calculus of kidney: Secondary | ICD-10-CM | POA: Diagnosis not present

## 2023-10-13 ENCOUNTER — Other Ambulatory Visit: Payer: Self-pay | Admitting: Internal Medicine

## 2023-10-13 DIAGNOSIS — I10 Essential (primary) hypertension: Secondary | ICD-10-CM

## 2023-10-15 DIAGNOSIS — L918 Other hypertrophic disorders of the skin: Secondary | ICD-10-CM | POA: Diagnosis not present

## 2023-10-15 DIAGNOSIS — Z08 Encounter for follow-up examination after completed treatment for malignant neoplasm: Secondary | ICD-10-CM | POA: Diagnosis not present

## 2023-10-15 DIAGNOSIS — Z85828 Personal history of other malignant neoplasm of skin: Secondary | ICD-10-CM | POA: Diagnosis not present

## 2023-10-15 DIAGNOSIS — L821 Other seborrheic keratosis: Secondary | ICD-10-CM | POA: Diagnosis not present

## 2023-11-10 ENCOUNTER — Ambulatory Visit: Payer: Self-pay

## 2023-11-10 NOTE — Telephone Encounter (Signed)
 FYI Only or Action Required?: Action required by provider  Patient was last seen in primary care on 09/24/2023 by Meldon Sport, MD. Called Nurse Triage reporting Knee Pain. Symptoms began several weeks ago. Interventions attempted: OTC medications: Tylenol  Arthritis, Ibuprofen  800 mg and Rest, hydration, or home remedies. Symptoms are: gradually worsening.  Triage Disposition: See PCP When Office is Open (Within 3 Days)  Patient/caregiver understands and will follow disposition?: No, wishes to speak with PCP                                 Copied from CRM [id number]. Topic: Clinical - Red Word Triage >> Nov 10, 2023 11:24 AM Rosaria Common wrote: Red Word that prompted transfer to Nurse Triage: Pt dealing with osteoarthritis of the knees and seeking medical advice for the pain. Reason for Disposition  [1] MODERATE pain (e.g., interferes with normal activities, limping) AND [2] present > 3 days  Answer Assessment - Initial Assessment Questions 1. LOCATION and RADIATION: "Where is the pain located?"      Bilateral knees  3. SEVERITY: "How bad is the pain?" "What does it keep you from doing?"   (Scale 1-10; or mild, moderate, severe)   -  MILD (1-3): doesn't interfere with normal activities    -  MODERATE (4-7): interferes with normal activities (e.g., work or school) or awakens from sleep, limping    -  SEVERE (8-10): excruciating pain, unable to do any normal activities, unable to walk     8/10 4. ONSET: "When did the pain start?" "Does it come and go, or is it there all the time?"     Seen in May, worsening since 5. RECURRENT: "Have you had this pain before?" If Yes, ask: "When, and what happened then?"     Yes, previous dx osteoarthritis  8. ASSOCIATED SYMPTOMS: "Is there any swelling or redness of the knee?"     None 9. OTHER SYMPTOMS: "Do you have any other symptoms?" (e.g., chest pain, difficulty breathing, fever, calf pain)     None   Tylenol  Arthritis,  Ibuprofen  800 mg- no improvement  Protocols used: Knee Pain-A-AH

## 2023-11-11 NOTE — Telephone Encounter (Signed)
 States she has been doing ibuprofen  and arthritis tylenol  with no relief.

## 2023-11-12 ENCOUNTER — Telehealth: Payer: Self-pay | Admitting: *Deleted

## 2023-11-12 NOTE — Patient Instructions (Addendum)
 Visit Information  Thank you for taking time to visit with me today. Please don't hesitate to contact me if I can be of assistance to you before our next scheduled appointment.  Your new pcp office nurse care manager by telephone is Grandville Lax at [phone]  During the outreach today we confirmed you are not receiving any bilateral knee pain relief from the use of Tylenol  & Motrin  800 mg. This RN CM outreach to your pcp staff [employer] to request your pcp offer you another treatment plan for the osteoarthritis pain you are experiencing in your knees + a referral to an orthopedic provider. We discussed you new office RN CCM and you wrote down her number   Please call the care guide team at [phone] if you need to cancel, schedule, or reschedule an appointment.   Please call the Suicide and Crisis Lifeline: 988 call the [place]  National Suicide Prevention Lifeline: (701)824-6190 or TTY: (579) 574-0153 TTY 267-032-3295) to talk to a trained counselor call 1-800-273-TALK (toll free, 24 hour hotline) call the [facility]: 661-695-1300 call 911 if you are experiencing a Mental Health or Behavioral Health Crisis or need someone to talk to.  Hamed Debella L. Mcarthur Speedy, RN, BSN, CCM [place]  [facility], [facility] RN Care Manager Direct Dial: [phone]  Fax: [phone]

## 2023-11-12 NOTE — Patient Outreach (Signed)
 Collaboration with pcp & patient - bilateral knee pain   Patient left RN CM a message on 11/10/23 1712 related bilateral knee pain and requesting another treatment pain   During the outreach today, it was confirmed that the patient is not receiving any bilateral knee pain relief from the use of Tylenol  & Motrin  800 mg at home.  This RN CM outreached to the pcp staff, Johnanna to request assistance from her pcp to offer the patient another treatment plan for the osteoarthritis pain she is experiencing in her knees + a referral to an orthopedic provider.  RN CM discussed the new pcp office RN CCM and she wrote down the number  Pcp Medical assistant updated  Jullie Oiler L. Mcarthur Speedy, RN, BSN, CCM [place]  [facility], [facility] RN Care Manager Direct Dial: [phone]  Fax: [phone]

## 2023-11-16 ENCOUNTER — Other Ambulatory Visit: Payer: Self-pay | Admitting: Internal Medicine

## 2023-11-16 DIAGNOSIS — I10 Essential (primary) hypertension: Secondary | ICD-10-CM

## 2023-11-26 ENCOUNTER — Encounter: Payer: Self-pay | Admitting: Neurology

## 2023-11-26 ENCOUNTER — Ambulatory Visit: Admitting: Neurology

## 2023-11-26 VITALS — BP 139/82 | HR 81 | Ht 62.0 in | Wt 235.4 lb

## 2023-11-26 DIAGNOSIS — R296 Repeated falls: Secondary | ICD-10-CM

## 2023-11-26 DIAGNOSIS — R413 Other amnesia: Secondary | ICD-10-CM

## 2023-11-26 NOTE — Progress Notes (Signed)
 NEUROLOGY FOLLOW UP OFFICE NOTE  [name] [id number] [birth year]  HISTORY OF PRESENT ILLNESS: I had the pleasure of seeing [name] in follow-up in the neurology clinic on 11/26/2023.  She is again accompanied by her best friend [name] who helps supplement the history today. The patient was last seen on 5 months ago. She initially presented for frequent falls which initially appeared to improve after adjustment in psychiatric medications, however today she reports she has fallen a lot since her last visit. She is just clumsy, last fall was a few days ago as she was coming in the front door with her hands full and tripped on the small step. She bruised herself. She denies any focal numbness/tingling/weakness. She has neck, back, and left shoulder pain. No incontinence, she has occasional constipation. She denies any loss of consciousness with the falls. [name] talks to her every 2 hours on the phone and denies any episodes of unresponsiveness.    Main concern on last visit were memory changes, we had discussed Neurocognitive testing however due to staffing issues, this is not scheduled until 04/2024. Both of them report she would be talking and forget what she is talking about. Everyone has to finish her sentences. She denies getting lost driving but gets disoriented sometimes. She denies missing medications, [name] reports she asks her about it and sometimes takes the AM for PM but this is rare. She denies missing bill payments. She does not cook. She continues to see [name] with Psychiatry, tardive dyskinesia has resolved off Ingrezza  and with addition of Cogentin. She is on Xanax , Abilify , and Quetiapine .    History on Initial Assessment 05/14/2022: This is a 53 year old right-handed man with a history of hypertension, hypothyroidism, nephrolithiasis, CKD, anxiety, depression, presenting for frequent falls. She states she does not remember what occurs before or after a fall. Symptoms  started 6-8 months ago, last fall was 2 weeks ago. She lives with her father. Her father mentioned she got up one night to use the bathroom and fell, she was awake but not alert. She does not recall going to the bathroom or him helping her up. Another time 3-4 months ago, she was out of it and slurring her speech. She had fallen several times, getting up to use the bathroom and fall, awake but not really awake, occurring in the daytime as well. She was initially falling 4 times in a week so [name] stayed with her for 2 weeks, then all of a sudden, she was back to her old self. Symptoms then recurrent for 1-2 weeks where [name] and their aunt stayed to care for her. Speech was slurred so bad, she could not understand people around her but [name] reports she would follow instructions. One time she had urinary incontinence. [name] notes she gets really irritable during those periods then would not remember what she said. She reports her memory is bad in general. Over the past year, she denies getting lost driving but would get disoriented for a minute then realize where she is. She denies missing medications or bill payments but forgets to make appointments. She reports it takes a lot of effort to do what she needs to, such as paperwork. Her handwriting has also changed, it starts good then goes off the line.   They deny any staring/unresponsive episodes. She denies any olfactory/gustatory hallucinations. She has numbness and burning on the left lateral thigh. It hurts to lift her left arm, no numbness/tingling. Her left hip also  bothers her. She states the pain is not from prior falls. Her feet feel cold but they are warm when touched. She has brief body twitches and developed tics which Ingrezza  sometimes helps with. She denies any headaches, neck/back pain, dysarthria/dysphagia. She has a lot of dizziness. She was having a little double vision during the months she had the episodes. She has some constipation but  also has times she cannot hold her BM and has incontinence. [name] would help her get cleaned up but she would not recall how she got cleaned. She usually gets 5 hours of sleep and denies any change in sleep pattern or alcohol intake during the episodes. Mood is okay, she gets anxious and cold. She takes Xanax  every night with no missed doses. She is also on Lamotrigine  200mg  BID for mood. No family history of similar symptoms. She had a normal birth and early development.  There is no history of febrile convulsions, CNS infections such as meningitis/encephalitis, significant traumatic brain injury, neurosurgical procedures, or family history of seizures.  Diagnostic Data: Brain MRI with and without contrast in 06/2022 was motion degraded but there were no abnormalities seen.  MRI cervical spine with and without contrast did not show any myelopathy. There was mild to moderate disc degeneration at C4-5 and C5-6, disc bulge mildly effaces the ventral thecal sac and contacts ventral aspect of spinal cord.   1-hour EEG in 05/2022 was normal Ambulatory 26-hour EEG in 06/2022 normal, typical events not captured.    PAST MEDICAL HISTORY: Past Medical History:  Diagnosis Date   Anxiety    Chronic kidney disease    CKD (chronic kidney disease) stage 3, GFR 30-59 ml/min (HCC) 03/15/2019   Depression    Diarrhea 05/04/2017   Essential hypertension, benign 03/15/2019   History of kidney stones    Hypertension    Hypothyroidism    Hypothyroidism, adult 03/15/2019   Kidney stones    LGSIL on Pap smear of cervix 08/17/2020   Needs colpo per [facility] immediate risk is 4.3 for CIN 3+risk    Obesity (BMI 30-39.9) 03/15/2019   Renal disorder    Vitamin D  deficiency disease 03/15/2019    MEDICATIONS: Current Outpatient Medications on File Prior to Visit  Medication Sig Dispense Refill   ALPRAZolam  (XANAX ) 1 MG tablet Take 1 mg by mouth 2 (two) times daily as needed for anxiety. 04/17/23 Patient reports xanax   changed from tid to bid     amLODipine  (NORVASC ) 5 MG tablet TAKE 1 TABLET BY MOUTH DAILY 30 tablet 5   ARIPiprazole  (ABILIFY ) 5 MG tablet Take 5 mg by mouth at bedtime.     atorvastatin  (LIPITOR) 20 MG tablet TAKE 1 TABLET BY MOUTH EVERY DAY 90 tablet 3   benztropine (COGENTIN) 1 MG tablet Take 1 mg by mouth 2 (two) times daily.     carvedilol  (COREG ) 25 MG tablet TAKE 1 TABLET BY MOUTH TWICE DAILY 180 tablet 2   Cholecalciferol  (VITAMIN D3) 125 MCG (5000 UT) TABS Take 10,000 Units by mouth daily.     doxepin  (SINEQUAN ) 75 MG capsule Take 225 mg by mouth at bedtime.      lamoTRIgine  (LAMICTAL ) 200 MG tablet Take 200 mg by mouth 2 (two) times daily.     losartan  (COZAAR ) 25 MG tablet Take 25 mg by mouth in the morning.     nitrofurantoin  (MACRODANTIN ) 50 MG capsule Take 50 mg by mouth 4 (four) times daily.     phentermine  (ADIPEX-P ) 37.5 MG tablet Take 1  tablet (37.5 mg total) by mouth daily before breakfast. 30 tablet 2   potassium citrate  (UROCIT-K ) 10 MEQ (1080 MG) SR tablet Take 20 mEq by mouth in the morning and at bedtime.     QUEtiapine  (SEROQUEL  XR) 400 MG 24 hr tablet SMARTSIG:1 Tablet(s) By Mouth Every Evening     QUEtiapine  (SEROQUEL  XR) 200 MG 24 hr tablet Take 400 mg by mouth at bedtime.     No current facility-administered medications on file prior to visit.    ALLERGIES: Allergies  Allergen Reactions   Morphine And Codeine  Itching and Hives   Sulfa Antibiotics Swelling   Tape Itching and Other (See Comments)    Surgical     FAMILY HISTORY: Family History  Problem Relation Age of Onset   Cancer Paternal Grandmother    Heart failure Paternal Grandmother    Cancer Maternal Grandmother    Cancer Father    Heart failure Father    Lung cancer Mother    Colon cancer Neg Hx     SOCIAL HISTORY: Social History   Socioeconomic History   Marital status: Divorced    Spouse name: Not on file   Number of children: Not on file   Years of education: Not on file   Highest  education level: Not on file  Occupational History   Not on file  Tobacco Use   Smoking status: Former    Current packs/day: 0.00    Average packs/day: 0.5 packs/day for 20.0 years (10.0 ttl pk-yrs)    Types: Cigarettes    Start date: 02/08/2002    Quit date: 02/08/2022    Years since quitting: 1.7   Smokeless tobacco: Never  Vaping Use   Vaping status: Never Used  Substance and Sexual Activity   Alcohol use: No   Drug use: No   Sexual activity: Not Currently    Birth control/protection: Injection  Other Topics Concern   Not on file  Social History Narrative   Divorced twice,1st lasted 5 years,2nd 4 years.On disabilty secondary to mental illness since 2004.Previously used to [employer].Lives alone,has 2 cats.cright handed    Are you right handed or left handed? right   Are you currently employed ? no   What is your current occupation?   Do you live at home alone?no   Who lives with you? dad   What type of home do you live in: 1 story or 2 story? one    Caffeine 2 cans of soda   Social Drivers of Health   Financial Resource Strain: Low Risk  (07/21/2022)   Overall Financial Resource Strain (CARDIA)    Difficulty of Paying Living Expenses: Not very hard  Food Insecurity: No Food Insecurity (08/07/2022)   Hunger Vital Sign    Worried About Running Out of Food in the Last Year: Never true    Ran Out of Food in the Last Year: Never true  Transportation Needs: No Transportation Needs (08/07/2022)   PRAPARE - Administrator, Civil Service (Medical): No    Lack of Transportation (Non-Medical): No  Physical Activity: Insufficiently Active (08/06/2020)   Exercise Vital Sign    Days of Exercise per Week: 2 days    Minutes of Exercise per Session: 10 min  Stress: No Stress Concern Present (07/21/2022)   [employer] of Occupational Health - Occupational Stress Questionnaire    Feeling of Stress : Only a little  Social Connections: Socially Isolated (07/21/2022)    Social Connection and Isolation Panel  Frequency of Communication with Friends and Family: More than three times a week    Frequency of Social Gatherings with Friends and Family: Three times a week    Attends Religious Services: Never    Active Member of Clubs or Organizations: No    Attends Banker Meetings: Never    Marital Status: Divorced  Catering manager Violence: Not At Risk (08/07/2022)   Humiliation, Afraid, Rape, and Kick questionnaire    Fear of Current or Ex-Partner: No    Emotionally Abused: No    Physically Abused: No    Sexually Abused: No     PHYSICAL EXAM: Vitals:   11/26/23 0908  BP: 139/82  Pulse: 81  SpO2: 95%   General: No acute distress Head:  Normocephalic/atraumatic Skin/Extremities: No rash, no edema Neurological Exam: alert and awake. No aphasia or dysarthria. Fund of knowledge is appropriate.  Attention and concentration are normal.   Cranial nerves: Pupils equal, round. Extraocular movements intact with no nystagmus. Visual fields full.  No facial asymmetry.  Motor: Bulk and tone normal, no cogwheeling, muscle strength 5/5 throughout with pain on left shoulder abduction. Reflexes brisk +2 throughout with +hyperactive pectoralis reflex on right, negative Hoffman sign. Finger to nose testing intact.  Gait narrow-based and steady, mild difficulty with tandem walk but able. No tremors or dyskinesias in office today.    IMPRESSION: This is a 53 yo RH woman with a history of hypertension, hypothyroidism, nephrolithiasis, CKD, anxiety, depression, who presented with frequent falls where she has no recollection of events. MRI brain unremarkable, MRI cervical spine no myelopathy seen. Her routine and 26-hour EEG were normal. These had improved since psychiatric medications were reduced, however she continues to report frequent falls likely due to musculoskeletal issues (arthritis) or inattention. She will be referred for Balance therapy. They continue  to report cognitive changes, proceed with Neuropsychological testing as previously discussed, we will try to move up her appointment. Follow-up after testing, call for any changes.   Thank you for allowing me to participate in her care.  Please do not hesitate to call for any questions or concerns.    Darice Shivers, M.D.   CC: Dr. Tobie

## 2023-11-26 NOTE — Patient Instructions (Signed)
 Good to see you.  Referral will be sent for Balance therapy at [facility]  2. Let's try to move up the Neurocognitive testing  You have been referred for a neurocognitive evaluation in our office.   The evaluation has two parts.   The first part of the evaluation is a clinical interview with the neuropsychologist (Dr. Richie or Dr. Gayland). Please bring someone with you to this appointment if possible, as it is helpful for the doctor to hear from both you and another adult who knows you well.   The second part of the evaluation is testing with the doctor's technician Neal or Luke). The testing includes a variety of tasks- mostly question-and-answer, some paper-and-pencil. There is nothing you need to do to prepare for this appointment, but having a good night's sleep prior to the testing, taking medications as you normally would, and bringing eyeglasses and hearing aids (if you wear them), is advised. Please make sure that you wear a mask to the appointment.  Please note: We have to reserve several hours of the neuropsychologist's time and the psychometrician's time for your evaluation appointment. As such, please note that there is a No-Show fee of $100. If you are unable to attend any of your appointments, please contact our office as soon as possible to reschedule.

## 2023-12-08 ENCOUNTER — Other Ambulatory Visit (HOSPITAL_COMMUNITY): Payer: Self-pay | Admitting: Internal Medicine

## 2023-12-08 DIAGNOSIS — N6002 Solitary cyst of left breast: Secondary | ICD-10-CM

## 2023-12-14 ENCOUNTER — Other Ambulatory Visit: Payer: Self-pay | Admitting: Internal Medicine

## 2023-12-14 ENCOUNTER — Encounter (INDEPENDENT_AMBULATORY_CARE_PROVIDER_SITE_OTHER): Payer: Self-pay | Admitting: *Deleted

## 2023-12-15 ENCOUNTER — Inpatient Hospital Stay (HOSPITAL_COMMUNITY): Admission: RE | Admit: 2023-12-15 | Source: Ambulatory Visit

## 2023-12-15 ENCOUNTER — Ambulatory Visit (HOSPITAL_COMMUNITY)

## 2023-12-17 ENCOUNTER — Ambulatory Visit (INDEPENDENT_AMBULATORY_CARE_PROVIDER_SITE_OTHER): Admitting: Gastroenterology

## 2023-12-21 ENCOUNTER — Other Ambulatory Visit: Payer: Self-pay | Admitting: Internal Medicine

## 2023-12-21 ENCOUNTER — Encounter: Payer: Self-pay | Admitting: Obstetrics & Gynecology

## 2023-12-21 ENCOUNTER — Ambulatory Visit: Admitting: Obstetrics & Gynecology

## 2023-12-21 ENCOUNTER — Other Ambulatory Visit (HOSPITAL_COMMUNITY)
Admission: RE | Admit: 2023-12-21 | Discharge: 2023-12-21 | Disposition: A | Discharge status: Home / Self Care | Source: Ambulatory Visit | Attending: Obstetrics & Gynecology | Admitting: Obstetrics & Gynecology

## 2023-12-21 VITALS — BP 115/79 | HR 94 | Ht 62.0 in | Wt 234.0 lb

## 2023-12-21 DIAGNOSIS — Z01419 Encounter for gynecological examination (general) (routine) without abnormal findings: Secondary | ICD-10-CM | POA: Insufficient documentation

## 2023-12-21 DIAGNOSIS — Z1151 Encounter for screening for human papillomavirus (HPV): Secondary | ICD-10-CM | POA: Insufficient documentation

## 2023-12-21 DIAGNOSIS — N3946 Mixed incontinence: Secondary | ICD-10-CM | POA: Diagnosis not present

## 2023-12-21 MED ORDER — MIRABEGRON ER 50 MG PO TB24: 50.0000 mg | ORAL_TABLET | Freq: Every day | ORAL | 11 refills | Status: AC

## 2023-12-21 NOTE — Progress Notes (Signed)
 Subjective:     [name] is a 53 y.o. female here for a routine exam.  No LMP recorded (lmp unknown). Patient is postmenopausal. G1P0010 Birth Control Method:  menopausal Menstrual Calendar(currently): amenorrhea  Current complaints: urine loss 4-5 times per week.   Current acute medical issues:     Recent Gynecologic History No LMP recorded (lmp unknown). Patient is postmenopausal. Last Pap: 2022  normal Last mammogram: 2024,  having it this month  Past Medical History:  Diagnosis Date   Anxiety    Chronic kidney disease    CKD (chronic kidney disease) stage 3, GFR 30-59 ml/min (HCC) 03/15/2019   Depression    Diarrhea 05/04/2017   Essential hypertension, benign 03/15/2019   History of kidney stones    Hypertension    Hypothyroidism    Hypothyroidism, adult 03/15/2019   Kidney stones    LGSIL on Pap smear of cervix 08/17/2020   Needs colpo per [facility] immediate risk is 4.3 for CIN 3+risk    Obesity (BMI 30-39.9) 03/15/2019   Renal disorder    Vitamin D  deficiency disease 03/15/2019    Past Surgical History:  Procedure Laterality Date   BIOPSY  06/05/2017   Procedure: BIOPSY;  Surgeon: Golda Claudis PENNER, MD;  [facility]: [facility];  Service: Endoscopy;;  colon   CHOLECYSTECTOMY     COLONOSCOPY N/A 06/05/2017   Procedure: COLONOSCOPY;  Surgeon: Golda Claudis PENNER, MD;  Location: [facility];  Service: Endoscopy;  Laterality: N/A;  8:30   CYSTOSCOPY WITH RETROGRADE PYELOGRAM, URETEROSCOPY AND STENT PLACEMENT Left 10/04/2020   Procedure: CYSTOSCOPY WITH LEFT RETROGRADE PYELOGRAM, DIAGNOSTIC LEFT URETEROSCOPY AND LEFT URETEREAL STENT PLACEMENT;  Surgeon: Sherrilee Belvie CROME, MD;  Location: [facility];  Service: Urology;  Laterality: Left;   CYSTOSCOPY WITH RETROGRADE PYELOGRAM, URETEROSCOPY AND STENT PLACEMENT Left 10/16/2020   Procedure: CYSTOSCOPY WITH LEFT RETROGRADE PYELOGRAM, LEFT URETEROSCOPY  WITH LASER AND LEFT URETERAL STENT EXCHANGE;  Surgeon: Sherrilee Belvie CROME, MD;   Location: [facility];  Service: Urology;  Laterality: Left;   CYSTOSCOPY WITH RETROGRADE PYELOGRAM, URETEROSCOPY AND STENT PLACEMENT Bilateral 02/17/2022   Procedure: CYSTOSCOPY WITH RETROGRADE PYELOGRAM, URETEROSCOPY AND STENT PLACEMENT;  Surgeon: Sherrilee Belvie CROME, MD;  Location: [facility];  Service: Urology;  Laterality: Bilateral;   CYSTOSCOPY WITH RETROGRADE PYELOGRAM, URETEROSCOPY AND STENT PLACEMENT Left 06/26/2022   Procedure: CYSTOSCOPY WITH RETROGRADE PYELOGRAM, URETEROSCOPY AND STENT PLACEMENT;  Surgeon: Sherrilee Belvie CROME, MD;  Location: [facility];  Service: Urology;  Laterality: Left;   HOLMIUM LASER APPLICATION Left 10/16/2020   Procedure: HOLMIUM LASER APPLICATION;  Surgeon: Sherrilee Belvie CROME, MD;  Location: [facility];  Service: Urology;  Laterality: Left;   HOLMIUM LASER APPLICATION Left 06/26/2022   Procedure: HOLMIUM LASER APPLICATION;  Surgeon: Sherrilee Belvie CROME, MD;  Location: [facility];  Service: Urology;  Laterality: Left;   IR  NEPHROURETERAL CATH PLACE LEFT  08/05/2022   KIDNEY STONE SURGERY     kidney stones     LASER ABLATION CONDOLAMATA N/A 10/31/2020   Procedure: LASER ABLATION OF THE CERVIX;  Surgeon: Jayne Vonn DEL, MD;  Location: [facility];  Service: Gynecology;  Laterality: N/A;   NEPHROLITHOTOMY Left 08/07/2022   Procedure: NEPHROLITHOTOMY PERCUTANEOUS;  Surgeon: Sherrilee Belvie CROME, MD;  Location: [facility];  Service: Urology;  Laterality: Left;    OB History     Gravida  1   Para      Term      Preterm      AB  1  Living  0      SAB      IAB      Ectopic      Multiple      Live Births              Social History   Socioeconomic History   Marital status: Divorced    Spouse name: Not on file   Number of children: Not on file   Years of education: Not on file   Highest education level: Not on file  Occupational History   Not on file  Tobacco Use   Smoking status: Former    Current packs/day: 0.00    Average packs/day: 0.5 packs/day for 20.0 years  (10.0 ttl pk-yrs)    Types: Cigarettes    Start date: 02/08/2002    Quit date: 02/08/2022    Years since quitting: 1.8   Smokeless tobacco: Never  Vaping Use   Vaping status: Never Used  Substance and Sexual Activity   Alcohol use: No   Drug use: No   Sexual activity: Not Currently    Birth control/protection: Post-menopausal  Other Topics Concern   Not on file  Social History Narrative   Divorced twice,1st lasted 5 years,2nd 4 years.On disabilty secondary to mental illness since 2004.Previously used to [employer].Lives alone,has 2 cats.cright handed    Are you right handed or left handed? right   Are you currently employed ? no   What is your current occupation?   Do you live at home alone?no   Who lives with you? dad   What type of home do you live in: 1 story or 2 story? one    Caffeine 2 cans of soda   Social Drivers of Health   Financial Resource Strain: Medium Risk (12/21/2023)   Overall Financial Resource Strain (CARDIA)    Difficulty of Paying Living Expenses: Somewhat hard  Food Insecurity: No Food Insecurity (12/21/2023)   Hunger Vital Sign    Worried About Running Out of Food in the Last Year: Never true    Ran Out of Food in the Last Year: Never true  Transportation Needs: No Transportation Needs (12/21/2023)   PRAPARE - Administrator, Civil Service (Medical): No    Lack of Transportation (Non-Medical): No  Physical Activity: Inactive (12/21/2023)   Exercise Vital Sign    Days of Exercise per Week: 0 days    Minutes of Exercise per Session: 0 min  Stress: Stress Concern Present (12/21/2023)   [employer] of Occupational Health - Occupational Stress Questionnaire    Feeling of Stress: Very much  Social Connections: Socially Isolated (12/21/2023)   Social Connection and Isolation Panel    Frequency of Communication with Friends and Family: More than three times a week    Frequency of Social Gatherings with Friends and Family: Once a week     Attends Religious Services: Never    Database administrator or Organizations: No    Attends Engineer, structural: Never    Marital Status: Divorced    Family History  Problem Relation Age of Onset   Cancer Paternal Grandmother    Heart failure Paternal Grandmother    Cancer Maternal Grandmother    Cancer Father    Heart failure Father    Lung cancer Mother    Colon cancer Neg Hx      Current Outpatient Medications:    ALPRAZolam  (XANAX ) 1 MG tablet, Take 1 mg by mouth 2 (two)  times daily as needed for anxiety. 04/17/23 Patient reports xanax  changed from tid to bid (Patient taking differently: Take 1 mg by mouth 3 (three) times daily as needed for anxiety. 04/17/23 Patient reports xanax  changed from tid to bid), Disp: , Rfl:    amLODipine  (NORVASC ) 5 MG tablet, TAKE 1 TABLET BY MOUTH DAILY, Disp: 30 tablet, Rfl: 5   ARIPiprazole  (ABILIFY ) 5 MG tablet, Take 5 mg by mouth at bedtime., Disp: , Rfl:    atorvastatin  (LIPITOR) 20 MG tablet, TAKE 1 TABLET BY MOUTH EVERY DAY, Disp: 90 tablet, Rfl: 3   benztropine (COGENTIN) 1 MG tablet, Take 1 mg by mouth 2 (two) times daily., Disp: , Rfl:    carvedilol  (COREG ) 25 MG tablet, TAKE 1 TABLET BY MOUTH TWICE DAILY, Disp: 180 tablet, Rfl: 2   doxepin  (SINEQUAN ) 75 MG capsule, Take 225 mg by mouth at bedtime. , Disp: , Rfl:    lamoTRIgine  (LAMICTAL ) 200 MG tablet, Take 200 mg by mouth 2 (two) times daily., Disp: , Rfl:    losartan  (COZAAR ) 25 MG tablet, Take 25 mg by mouth in the morning., Disp: , Rfl:    mirabegron  ER (MYRBETRIQ ) 50 MG TB24 tablet, Take 1 tablet (50 mg total) by mouth daily. At bedtime, Disp: 30 tablet, Rfl: 11   nitrofurantoin  (MACRODANTIN ) 50 MG capsule, Take 50 mg by mouth 4 (four) times daily., Disp: , Rfl:    phentermine  (ADIPEX-P ) 37.5 MG tablet, Take 1 tablet (37.5 mg total) by mouth daily before breakfast., Disp: 30 tablet, Rfl: 2   potassium citrate  (UROCIT-K ) 10 MEQ (1080 MG) SR tablet, Take 20 mEq by mouth in  the morning and at bedtime., Disp: , Rfl:    QUEtiapine  (SEROQUEL  XR) 400 MG 24 hr tablet, SMARTSIG:1 Tablet(s) By Mouth Every Evening, Disp: , Rfl:    Cholecalciferol  (VITAMIN D3) 125 MCG (5000 UT) TABS, Take 10,000 Units by mouth daily., Disp: , Rfl:    QUEtiapine  (SEROQUEL  XR) 200 MG 24 hr tablet, Take 400 mg by mouth at bedtime., Disp: , Rfl:   Review of Systems  Review of Systems  Constitutional: Negative for fever, chills, weight loss, malaise/fatigue and diaphoresis.  HENT: Negative for hearing loss, ear pain, nosebleeds, congestion, sore throat, neck pain, tinnitus and ear discharge.   Eyes: Negative for blurred vision, double vision, photophobia, pain, discharge and redness.  Respiratory: Negative for cough, hemoptysis, sputum production, shortness of breath, wheezing and stridor.   Cardiovascular: Negative for chest pain, palpitations, orthopnea, claudication, leg swelling and PND.  Gastrointestinal: negative for abdominal pain. Negative for heartburn, nausea, vomiting, diarrhea, constipation, blood in stool and melena.  Genitourinary: Negative for dysuria, urgency, frequency, hematuria and flank pain.  Musculoskeletal: Negative for myalgias, back pain, joint pain and falls.  Skin: Negative for itching and rash.  Neurological: Negative for dizziness, tingling, tremors, sensory change, speech change, focal weakness, seizures, loss of consciousness, weakness and headaches.  Endo/Heme/Allergies: Negative for environmental allergies and polydipsia. Does not bruise/bleed easily.  Psychiatric/Behavioral: Negative for depression, suicidal ideas, hallucinations, memory loss and substance abuse. The patient is not nervous/anxious and does not have insomnia.        Objective:  Blood pressure 115/79, pulse 94, height 5' 2 (1.575 m), weight 234 lb (106.1 kg).   Physical Exam  Vitals reviewed. Constitutional: She is oriented to person, place, and time. She appears well-developed and  well-nourished.  HENT:  Head: Normocephalic and atraumatic.        Right Ear: External ear normal.  Left  Ear: External ear normal.  Nose: Nose normal.  Mouth/Throat: Oropharynx is clear and moist.  Eyes: Conjunctivae and EOM are normal. Pupils are equal, round, and reactive to light. Right eye exhibits no discharge. Left eye exhibits no discharge. No scleral icterus.  Neck: Normal range of motion. Neck supple. No tracheal deviation present. No thyromegaly present.  Cardiovascular: Normal rate, regular rhythm, normal heart sounds and intact distal pulses.  Exam reveals no gallop and no friction rub.   No murmur heard. Respiratory: Effort normal and breath sounds normal. No respiratory distress. She has no wheezes. She has no rales. She exhibits no tenderness.  GI: Soft. Bowel sounds are normal. She exhibits no distension and no mass. There is no tenderness. There is no rebound and no guarding.  Genitourinary:  Breasts no masses skin changes or nipple changes bilaterally      Vulva is normal without lesions Vagina is pink moist without discharge Cervix normal in appearance and pap is done Uterus is normal size shape and contour Adnexa is negative with normal sized ovaries  {Rectal   declined Musculoskeletal: Normal range of motion. She exhibits no edema and no tenderness.  Neurological: She is alert and oriented to person, place, and time. She has normal reflexes. She displays normal reflexes. No cranial nerve deficit. She exhibits normal muscle tone. Coordination normal.  Skin: Skin is warm and dry. No rash noted. No erythema. No pallor.  Psychiatric: She has a normal mood and affect. Her behavior is normal. Judgment and thought content normal.       Medications Ordered at today's visit: Meds ordered this encounter  Medications   mirabegron  ER (MYRBETRIQ ) 50 MG TB24 tablet    Sig: Take 1 tablet (50 mg total) by mouth daily. At bedtime    Dispense:  30 tablet    Refill:  11     Other orders placed at today's visit: No orders of the defined types were placed in this encounter.    ASSESSMENT + PLAN:    ICD-10-CM   1. Well woman exam without gynecological exam  Z00.00     2. Mixed incontinence: trial of myrbetriq  50 mg  N39.46           No follow-ups on file.

## 2023-12-21 NOTE — Addendum Note (Signed)
 Addended by: NEYSA CLARITA RAMAN on: 12/21/2023 12:47 PM   Modules accepted: Orders

## 2023-12-24 ENCOUNTER — Ambulatory Visit (INDEPENDENT_AMBULATORY_CARE_PROVIDER_SITE_OTHER): Admitting: Internal Medicine

## 2023-12-24 ENCOUNTER — Encounter: Payer: Self-pay | Admitting: Internal Medicine

## 2023-12-24 VITALS — BP 131/84 | HR 99 | Ht 63.0 in | Wt 237.6 lb

## 2023-12-24 DIAGNOSIS — N1832 Chronic kidney disease, stage 3b: Secondary | ICD-10-CM

## 2023-12-24 DIAGNOSIS — R739 Hyperglycemia, unspecified: Secondary | ICD-10-CM

## 2023-12-24 DIAGNOSIS — E782 Mixed hyperlipidemia: Secondary | ICD-10-CM | POA: Diagnosis not present

## 2023-12-24 DIAGNOSIS — F3170 Bipolar disorder, currently in remission, most recent episode unspecified: Secondary | ICD-10-CM

## 2023-12-24 DIAGNOSIS — I10 Essential (primary) hypertension: Secondary | ICD-10-CM | POA: Diagnosis not present

## 2023-12-24 NOTE — Assessment & Plan Note (Signed)
 Likely due to uncontrolled HTN in the past Followed by Nephrology - last visit note reviewed On Losartan  DCed Chlorthalidone  due to concern for orthostatic hypotension/ dehydration Avoid nephrotoxic agents Check CMP and CBC

## 2023-12-24 NOTE — Assessment & Plan Note (Signed)
On Lipitor Lipid profile reviewed

## 2023-12-24 NOTE — Assessment & Plan Note (Signed)
 BP Readings from Last 1 Encounters:  12/24/23 131/84   Well-controlled with amlodipine  5 mg QD, Losartan  25 mg once daily and Coreg  25 mg BID Followed by Nephrology Episodes of dizziness could be due to dehydration and orthostatic hypotension - improved since discontinuing chlorthalidone  in the last visit, had previously stopped clonidine  Needs to improve hydration Counseled for compliance with the medications Advised DASH diet and moderate exercise/walking, at least 150 mins/week

## 2023-12-24 NOTE — Patient Instructions (Signed)
 Please take half tablet of Phentermine  once daily for 2 weeks and then stop it.  Please maintain at least 64 ounces of fluid intake in a day.  Please continue to take other medications as prescribed.  Please continue to follow low carb diet and perform moderate exercise/walking as tolerated.

## 2023-12-24 NOTE — Assessment & Plan Note (Addendum)
 BMI Readings from Last 3 Encounters:  12/24/23 42.09 kg/m  12/21/23 42.80 kg/m  11/26/23 43.06 kg/m   Advised to follow low-carb diet and perform moderate exercise/walking at least 150 minutes/week Discussed about different medical weight loss options-would benefit from GLP-1 agonist therapy, but cost can be a limiting factor Has tried topiramate and Contrave in the past, would avoid topiramate due to history of nephrolithiasis DC phentermine  as it did not help Referred to nutrition counseling for CKD and obesity

## 2023-12-24 NOTE — Progress Notes (Signed)
 Established Patient Office Visit  Subjective:  Patient ID: [name], female    DOB: Oct 04, 1970  Age: 53 y.o. MRN: [id number]  CC:  Chief Complaint  Patient presents with   Hypertension    Three month follow up   Obesity    Three month follow up   clumsiness    Increased clumsiness     HPI [name] is a 53 y.o. female with past medical history of HTN, CKD stage III, bipolar disorder and nephrolithiasis who presents for f/u of her chronic medical conditions.  HTN: BP was well controlled today.  She takes amlodipine , losartan , and carvedilol  currently.  She has seen improvement in dizziness since stopping chlorthalidone  and clonidine .  Denies any headache, chest pain or palpitations.  Tardive dyskinesia: She had constant twitching of the mouth and the lower jaw.  Of note, she is currently on Abilify  5 mg nightly and Seroquel  400 mg nightly.  She is on benztropine instead of Ingrezza  now for extrapyramidal side effects of antipsychotics, which has improved her symptoms.  She has seen neurology for it.  She also has memory concerns, which are likely due to her psychiatric medicines.  Recurrent falls: She has h/o recurrent falls, but frequency has decreased now.  Does not report any syncopal episode since the last visit.  She has seen neurologist, and has had MRI of the brain and cervical spine, which were unremarkable to suggest any specific etiology for falls. Denies any episode of shaking.  She also had EEG, which was also unremarkable.  She reports chronic leg weakness.  CKD:  Followed by Nephrology.  Denies any dysuria, hematuria or urinary hesitancy or resistance.  Recurrent nephrolithiasis: She had nephrolithotomy in 03/24. She currently denies any dysuria, hematuria, flank pain, fever, chills or nausea/vomiting.  Morbid obesity: She is struggling to lose weight despite trying phentermine .  She agrees to follow low-carb diet.  She admits that she eats in frequent intervals  and does not like healthier food such as vegetables and fruits, but is willing to make adjustments.  Her exercise capacity is limited due to chronic knee pain.  Past Medical History:  Diagnosis Date   Anxiety    Chronic kidney disease    CKD (chronic kidney disease) stage 3, GFR 30-59 ml/min (HCC) 03/15/2019   Depression    Diarrhea 05/04/2017   Essential hypertension, benign 03/15/2019   History of kidney stones    Hypertension    Hypothyroidism    Hypothyroidism, adult 03/15/2019   Kidney stones    LGSIL on Pap smear of cervix 08/17/2020   Needs colpo per [facility] immediate risk is 4.3 for CIN 3+risk    Obesity (BMI 30-39.9) 03/15/2019   Renal disorder    Vitamin D  deficiency disease 03/15/2019    Past Surgical History:  Procedure Laterality Date   BIOPSY  06/05/2017   Procedure: BIOPSY;  Surgeon: Golda Claudis PENNER, MD;  Location: [facility];  Service: Endoscopy;;  colon   CHOLECYSTECTOMY     COLONOSCOPY N/A 06/05/2017   Procedure: COLONOSCOPY;  Surgeon: Golda Claudis PENNER, MD;  Location: [facility];  Service: Endoscopy;  Laterality: N/A;  8:30   CYSTOSCOPY WITH RETROGRADE PYELOGRAM, URETEROSCOPY AND STENT PLACEMENT Left 10/04/2020   Procedure: CYSTOSCOPY WITH LEFT RETROGRADE PYELOGRAM, DIAGNOSTIC LEFT URETEROSCOPY AND LEFT URETEREAL STENT PLACEMENT;  Surgeon: Sherrilee Belvie CROME, MD;  Location: [facility];  Service: Urology;  Laterality: Left;   CYSTOSCOPY WITH RETROGRADE PYELOGRAM, URETEROSCOPY AND STENT PLACEMENT Left 10/16/2020  Procedure: CYSTOSCOPY WITH LEFT RETROGRADE PYELOGRAM, LEFT URETEROSCOPY  WITH LASER AND LEFT URETERAL STENT EXCHANGE;  Surgeon: Sherrilee Belvie CROME, MD;  Location: [facility];  Service: Urology;  Laterality: Left;   CYSTOSCOPY WITH RETROGRADE PYELOGRAM, URETEROSCOPY AND STENT PLACEMENT Bilateral 02/17/2022   Procedure: CYSTOSCOPY WITH RETROGRADE PYELOGRAM, URETEROSCOPY AND STENT PLACEMENT;  Surgeon: Sherrilee Belvie CROME, MD;  Location: [facility];  Service: Urology;   Laterality: Bilateral;   CYSTOSCOPY WITH RETROGRADE PYELOGRAM, URETEROSCOPY AND STENT PLACEMENT Left 06/26/2022   Procedure: CYSTOSCOPY WITH RETROGRADE PYELOGRAM, URETEROSCOPY AND STENT PLACEMENT;  Surgeon: Sherrilee Belvie CROME, MD;  Location: [facility];  Service: Urology;  Laterality: Left;   HOLMIUM LASER APPLICATION Left 10/16/2020   Procedure: HOLMIUM LASER APPLICATION;  Surgeon: Sherrilee Belvie CROME, MD;  Location: [facility];  Service: Urology;  Laterality: Left;   HOLMIUM LASER APPLICATION Left 06/26/2022   Procedure: HOLMIUM LASER APPLICATION;  Surgeon: Sherrilee Belvie CROME, MD;  Location: [facility];  Service: Urology;  Laterality: Left;   IR  NEPHROURETERAL CATH PLACE LEFT  08/05/2022   KIDNEY STONE SURGERY     kidney stones     LASER ABLATION CONDOLAMATA N/A 10/31/2020   Procedure: LASER ABLATION OF THE CERVIX;  Surgeon: Jayne Vonn DEL, MD;  Location: [facility];  Service: Gynecology;  Laterality: N/A;   NEPHROLITHOTOMY Left 08/07/2022   Procedure: NEPHROLITHOTOMY PERCUTANEOUS;  Surgeon: Sherrilee Belvie CROME, MD;  Location: [facility];  Service: Urology;  Laterality: Left;    Family History  Problem Relation Age of Onset   Cancer Paternal Grandmother    Heart failure Paternal Grandmother    Cancer Maternal Grandmother    Cancer Father    Heart failure Father    Lung cancer Mother    Colon cancer Neg Hx     Social History   Socioeconomic History   Marital status: Divorced    Spouse name: Not on file   Number of children: Not on file   Years of education: Not on file   Highest education level: Not on file  Occupational History   Not on file  Tobacco Use   Smoking status: Former    Current packs/day: 0.00    Average packs/day: 0.5 packs/day for 20.0 years (10.0 ttl pk-yrs)    Types: Cigarettes    Start date: 02/08/2002    Quit date: 02/08/2022    Years since quitting: 1.8   Smokeless tobacco: Never  Vaping Use   Vaping status: Never Used  Substance and Sexual Activity   Alcohol use: No   Drug  use: No   Sexual activity: Not Currently    Birth control/protection: Post-menopausal  Other Topics Concern   Not on file  Social History Narrative   Divorced twice,1st lasted 5 years,2nd 4 years.On disabilty secondary to mental illness since 2004.Previously used to [employer].Lives alone,has 2 cats.cright handed    Are you right handed or left handed? right   Are you currently employed ? no   What is your current occupation?   Do you live at home alone?no   Who lives with you? dad   What type of home do you live in: 1 story or 2 story? one    Caffeine 2 cans of soda   Social Drivers of Health   Financial Resource Strain: Medium Risk (12/21/2023)   Overall Financial Resource Strain (CARDIA)    Difficulty of Paying Living Expenses: Somewhat hard  Food Insecurity: No Food Insecurity (12/21/2023)   Hunger Vital Sign    Worried  About Running Out of Food in the Last Year: Never true    Ran Out of Food in the Last Year: Never true  Transportation Needs: No Transportation Needs (12/21/2023)   PRAPARE - Administrator, Civil Service (Medical): No    Lack of Transportation (Non-Medical): No  Physical Activity: Inactive (12/21/2023)   Exercise Vital Sign    Days of Exercise per Week: 0 days    Minutes of Exercise per Session: 0 min  Stress: Stress Concern Present (12/21/2023)   [employer] of Occupational Health - Occupational Stress Questionnaire    Feeling of Stress: Very much  Social Connections: Socially Isolated (12/21/2023)   Social Connection and Isolation Panel    Frequency of Communication with Friends and Family: More than three times a week    Frequency of Social Gatherings with Friends and Family: Once a week    Attends Religious Services: Never    Database administrator or Organizations: No    Attends Banker Meetings: Never    Marital Status: Divorced  Catering manager Violence: Not At Risk (12/21/2023)   Humiliation, Afraid, Rape,  and Kick questionnaire    Fear of Current or Ex-Partner: No    Emotionally Abused: No    Physically Abused: No    Sexually Abused: No    Outpatient Medications Prior to Visit  Medication Sig Dispense Refill   ALPRAZolam  (XANAX ) 1 MG tablet Take 1 mg by mouth 2 (two) times daily as needed for anxiety. 04/17/23 Patient reports xanax  changed from tid to bid (Patient taking differently: Take 1 mg by mouth 3 (three) times daily as needed for anxiety. 04/17/23 Patient reports xanax  changed from tid to bid)     amLODipine  (NORVASC ) 5 MG tablet TAKE 1 TABLET BY MOUTH DAILY 30 tablet 5   ARIPiprazole  (ABILIFY ) 5 MG tablet Take 5 mg by mouth at bedtime.     atorvastatin  (LIPITOR) 20 MG tablet TAKE 1 TABLET BY MOUTH EVERY DAY 90 tablet 3   benztropine (COGENTIN) 1 MG tablet Take 1 mg by mouth 2 (two) times daily.     carvedilol  (COREG ) 25 MG tablet TAKE 1 TABLET BY MOUTH TWICE DAILY 180 tablet 2   Cholecalciferol  (VITAMIN D3) 125 MCG (5000 UT) TABS Take 10,000 Units by mouth daily.     doxepin  (SINEQUAN ) 75 MG capsule Take 225 mg by mouth at bedtime.      lamoTRIgine  (LAMICTAL ) 200 MG tablet Take 200 mg by mouth 2 (two) times daily.     losartan  (COZAAR ) 25 MG tablet Take 25 mg by mouth in the morning.     mirabegron  ER (MYRBETRIQ ) 50 MG TB24 tablet Take 1 tablet (50 mg total) by mouth daily. At bedtime 30 tablet 11   nitrofurantoin  (MACRODANTIN ) 50 MG capsule Take 50 mg by mouth 4 (four) times daily.     potassium citrate  (UROCIT-K ) 10 MEQ (1080 MG) SR tablet Take 20 mEq by mouth in the morning and at bedtime.     QUEtiapine  (SEROQUEL  XR) 200 MG 24 hr tablet Take 400 mg by mouth at bedtime.     QUEtiapine  (SEROQUEL  XR) 400 MG 24 hr tablet SMARTSIG:1 Tablet(s) By Mouth Every Evening     phentermine  (ADIPEX-P ) 37.5 MG tablet Take 1 tablet (37.5 mg total) by mouth daily before breakfast. 30 tablet 2   No facility-administered medications prior to visit.    Allergies  Allergen Reactions   Morphine  And Codeine  Itching and Hives   Sulfa Antibiotics  Swelling   Tape Itching and Other (See Comments)    Surgical     ROS Review of Systems  Constitutional:  Negative for chills and fever.  HENT:  Negative for congestion, sinus pressure, sinus pain and sore throat.   Eyes:  Negative for pain and discharge.  Respiratory:  Negative for cough and shortness of breath.   Cardiovascular:  Negative for chest pain and palpitations.  Gastrointestinal:  Negative for abdominal pain, diarrhea, nausea and vomiting.  Endocrine: Negative for polydipsia and polyuria.  Genitourinary:  Negative for dysuria and hematuria.  Musculoskeletal:  Positive for arthralgias. Negative for neck pain and neck stiffness.  Skin:  Negative for rash.  Neurological:  Negative for dizziness and weakness.  Psychiatric/Behavioral:  Positive for confusion and dysphoric mood. Negative for agitation and behavioral problems. The patient is nervous/anxious.       Objective:    Physical Exam Vitals reviewed.  Constitutional:      General: She is not in acute distress.    Appearance: She is not diaphoretic.  HENT:     Head: Normocephalic and atraumatic.     Nose: Nose normal.     Mouth/Throat:     Mouth: Mucous membranes are moist.  Eyes:     General: No scleral icterus.    Extraocular Movements: Extraocular movements intact.  Cardiovascular:     Rate and Rhythm: Normal rate and regular rhythm.     Heart sounds: Normal heart sounds. No murmur heard. Pulmonary:     Breath sounds: Normal breath sounds. No wheezing or rales.  Musculoskeletal:     Right shoulder: Tenderness and crepitus present. No swelling.     Left shoulder: No swelling.     Cervical back: Neck supple. No tenderness.     Right knee: Swelling present. No tenderness.     Left knee: Swelling present. No tenderness.     Right lower leg: No edema.     Left lower leg: No edema.     Comments: Painful ROM over bilateral shoulders  Skin:    General: Skin  is warm.     Findings: No rash.  Neurological:     General: No focal deficit present.     Mental Status: She is alert and oriented to person, place, and time.     Sensory: No sensory deficit.     Motor: Weakness (B/l LE - 4/5) present.  Psychiatric:        Mood and Affect: Mood is depressed.        Behavior: Behavior is cooperative.     BP 131/84   Pulse 99   Ht 5' 3 (1.6 m)   Wt 237 lb 9.6 oz (107.8 kg)   LMP  (LMP Unknown)   SpO2 94%   BMI 42.09 kg/m  Wt Readings from Last 3 Encounters:  12/24/23 237 lb 9.6 oz (107.8 kg)  12/21/23 234 lb (106.1 kg)  11/26/23 235 lb 6.4 oz (106.8 kg)    Lab Results  Component Value Date   TSH 2.27 02/09/2023   Lab Results  Component Value Date   WBC 8.8 08/11/2022   HGB 12.8 08/11/2022   HCT 38.9 08/11/2022   MCV 91.7 08/11/2022   PLT 324 08/11/2022   Lab Results  Component Value Date   NA 139 08/11/2022   K 3.3 (L) 08/11/2022   CO2 26 08/11/2022   GLUCOSE 82 08/11/2022   BUN 16 08/11/2022   CREATININE 1.46 (H) 08/11/2022   BILITOT 0.6 08/11/2022  ALKPHOS 146 (H) 08/11/2022   AST 28 08/11/2022   ALT 23 08/11/2022   PROT 7.1 08/11/2022   ALBUMIN 3.6 08/11/2022   CALCIUM  9.1 08/11/2022   ANIONGAP 12 08/11/2022   Lab Results  Component Value Date   CHOL 244 (H) 09/04/2021   Lab Results  Component Value Date   HDL 44 (L) 09/04/2021   Lab Results  Component Value Date   LDLCALC 161 (H) 09/04/2021   Lab Results  Component Value Date   TRIG 218 (H) 09/04/2021   Lab Results  Component Value Date   CHOLHDL 5.5 (H) 09/04/2021   Lab Results  Component Value Date   HGBA1C 5.2 09/04/2021      Assessment & Plan:   Problem List Items Addressed This Visit       Cardiovascular and Mediastinum   Essential hypertension - Primary   BP Readings from Last 1 Encounters:  12/24/23 131/84   Well-controlled with amlodipine  5 mg QD, Losartan  25 mg once daily and Coreg  25 mg BID Followed by Nephrology Episodes of  dizziness could be due to dehydration and orthostatic hypotension - improved since discontinuing chlorthalidone  in the last visit, had previously stopped clonidine  Needs to improve hydration Counseled for compliance with the medications Advised DASH diet and moderate exercise/walking, at least 150 mins/week      Relevant Orders   TSH   CMP14+EGFR   CBC with Differential/Platelet   Amb ref to Medical Nutrition Therapy-MNT     Genitourinary   CKD (chronic kidney disease) stage 3, GFR 30-59 ml/min (HCC)   Likely due to uncontrolled HTN in the past Followed by Nephrology - last visit note reviewed On Losartan  DCed Chlorthalidone  due to concern for orthostatic hypotension/ dehydration Avoid nephrotoxic agents Check CMP and CBC      Relevant Orders   VITAMIN D  25 Hydroxy (Vit-D Deficiency, Fractures)   CMP14+EGFR   CBC with Differential/Platelet   Amb ref to Medical Nutrition Therapy-MNT     Other   Bipolar disorder in partial remission (HCC)   Followed by psychiatry On Abilify , doxepin , Lamictal  and Seroquel  Takes Xanax  as needed for anxiet Her clumsiness/drowsiness is likely due to her psychiatric medications      Mixed hyperlipidemia   On Lipitor Lipid profile reviewed      Relevant Orders   Lipid panel   Amb ref to Medical Nutrition Therapy-MNT   Morbid obesity (HCC)   BMI Readings from Last 3 Encounters:  12/24/23 42.09 kg/m  12/21/23 42.80 kg/m  11/26/23 43.06 kg/m   Advised to follow low-carb diet and perform moderate exercise/walking at least 150 minutes/week Discussed about different medical weight loss options-would benefit from GLP-1 agonist therapy, but cost can be a limiting factor Has tried topiramate and Contrave in the past, would avoid topiramate due to history of nephrolithiasis DC phentermine  as it did not help Referred to nutrition counseling for CKD and obesity      Relevant Orders   Amb ref to Medical Nutrition Therapy-MNT   Other Visit  Diagnoses       Hyperglycemia       Relevant Orders   Hemoglobin A1c   CMP14+EGFR          No orders of the defined types were placed in this encounter.   Follow-up: Return in about 4 months (around 04/25/2024) for HTN and obesity.    Suzzane MARLA Blanch, MD

## 2023-12-24 NOTE — Assessment & Plan Note (Signed)
 Followed by psychiatry On Abilify , doxepin , Lamictal  and Seroquel  Takes Xanax  as needed for anxiet Her clumsiness/drowsiness is likely due to her psychiatric medications

## 2023-12-25 ENCOUNTER — Ambulatory Visit: Payer: Self-pay | Admitting: Internal Medicine

## 2023-12-25 ENCOUNTER — Other Ambulatory Visit: Payer: Self-pay | Admitting: Internal Medicine

## 2023-12-25 DIAGNOSIS — R748 Abnormal levels of other serum enzymes: Secondary | ICD-10-CM

## 2023-12-25 LAB — CBC WITH DIFFERENTIAL/PLATELET
Basophils Absolute: 0.1 x10E3/uL (ref 0.0–0.2)
Basos: 1 %
EOS (ABSOLUTE): 0.3 x10E3/uL (ref 0.0–0.4)
Eos: 4 %
Hematocrit: 43.4 % (ref 34.0–46.6)
Hemoglobin: 14 g/dL (ref 11.1–15.9)
Immature Grans (Abs): 0 x10E3/uL (ref 0.0–0.1)
Immature Granulocytes: 0 %
Lymphocytes Absolute: 2.1 x10E3/uL (ref 0.7–3.1)
Lymphs: 34 %
MCH: 28.3 pg (ref 26.6–33.0)
MCHC: 32.3 g/dL (ref 31.5–35.7)
MCV: 88 fL (ref 79–97)
Monocytes Absolute: 0.7 x10E3/uL (ref 0.1–0.9)
Monocytes: 11 %
Neutrophils Absolute: 3.1 x10E3/uL (ref 1.4–7.0)
Neutrophils: 50 %
Platelets: 278 x10E3/uL (ref 150–450)
RBC: 4.94 x10E6/uL (ref 3.77–5.28)
RDW: 15.3 % (ref 11.7–15.4)
WBC: 6.2 x10E3/uL (ref 3.4–10.8)

## 2023-12-25 LAB — CMP14+EGFR
ALT: 33 IU/L — ABNORMAL HIGH (ref 0–32)
AST: 19 IU/L (ref 0–40)
Albumin: 4 g/dL (ref 3.8–4.9)
Alkaline Phosphatase: 215 IU/L — ABNORMAL HIGH (ref 44–121)
BUN/Creatinine Ratio: 18 (ref 9–23)
BUN: 25 mg/dL — ABNORMAL HIGH (ref 6–24)
Bilirubin Total: 0.2 mg/dL (ref 0.0–1.2)
CO2: 18 mmol/L — ABNORMAL LOW (ref 20–29)
Calcium: 9.5 mg/dL (ref 8.7–10.2)
Chloride: 101 mmol/L (ref 96–106)
Creatinine, Ser: 1.38 mg/dL — ABNORMAL HIGH (ref 0.57–1.00)
Globulin, Total: 2.9 g/dL (ref 1.5–4.5)
Glucose: 81 mg/dL (ref 70–99)
Potassium: 5.2 mmol/L (ref 3.5–5.2)
Sodium: 135 mmol/L (ref 134–144)
Total Protein: 6.9 g/dL (ref 6.0–8.5)
eGFR: 46 mL/min/1.73 — ABNORMAL LOW (ref >59)

## 2023-12-25 LAB — HEMOGLOBIN A1C
Est. average glucose Bld gHb Est-mCnc: 105 mg/dL
Hgb A1c MFr Bld: 5.3 % (ref 4.8–5.6)

## 2023-12-25 LAB — CYTOLOGY - PAP
Comment: NEGATIVE
Diagnosis: NEGATIVE
High risk HPV: NEGATIVE

## 2023-12-25 LAB — VITAMIN D 25 HYDROXY (VIT D DEFICIENCY, FRACTURES): Vit D, 25-Hydroxy: 36.5 ng/mL (ref 30.0–100.0)

## 2023-12-25 LAB — LIPID PANEL
Chol/HDL Ratio: 2.8 ratio (ref 0.0–4.4)
Cholesterol, Total: 158 mg/dL (ref 100–199)
HDL: 57 mg/dL (ref >39)
LDL Chol Calc (NIH): 72 mg/dL (ref 0–99)
Triglycerides: 176 mg/dL — ABNORMAL HIGH (ref 0–149)
VLDL Cholesterol Cal: 29 mg/dL (ref 5–40)

## 2023-12-25 LAB — TSH: TSH: 2.66 u[IU]/mL (ref 0.450–4.500)

## 2023-12-25 NOTE — Progress Notes (Addendum)
 NA

## 2023-12-29 ENCOUNTER — Ambulatory Visit (HOSPITAL_COMMUNITY)
Admission: RE | Admit: 2023-12-29 | Discharge: 2023-12-29 | Disposition: A | Discharge status: Home / Self Care | Source: Ambulatory Visit | Attending: Internal Medicine

## 2023-12-29 ENCOUNTER — Ambulatory Visit (HOSPITAL_COMMUNITY)
Admission: RE | Admit: 2023-12-29 | Discharge: 2023-12-29 | Disposition: A | Discharge status: Home / Self Care | Source: Ambulatory Visit | Attending: Internal Medicine | Admitting: Internal Medicine

## 2023-12-29 DIAGNOSIS — N6002 Solitary cyst of left breast: Secondary | ICD-10-CM | POA: Insufficient documentation

## 2023-12-29 DIAGNOSIS — N6325 Unspecified lump in the left breast, overlapping quadrants: Secondary | ICD-10-CM | POA: Diagnosis not present

## 2023-12-29 DIAGNOSIS — R92333 Mammographic heterogeneous density, bilateral breasts: Secondary | ICD-10-CM | POA: Diagnosis not present

## 2024-01-04 ENCOUNTER — Encounter (INDEPENDENT_AMBULATORY_CARE_PROVIDER_SITE_OTHER): Payer: Self-pay | Admitting: Gastroenterology

## 2024-01-04 ENCOUNTER — Ambulatory Visit (INDEPENDENT_AMBULATORY_CARE_PROVIDER_SITE_OTHER): Admitting: Gastroenterology

## 2024-01-04 ENCOUNTER — Ambulatory Visit (HOSPITAL_COMMUNITY)
Admission: RE | Admit: 2024-01-04 | Discharge: 2024-01-04 | Disposition: A | Discharge status: Home / Self Care | Source: Ambulatory Visit | Attending: Gastroenterology | Admitting: Gastroenterology

## 2024-01-04 VITALS — BP 136/82 | HR 93 | Temp 97.1°F | Ht 62.5 in | Wt 235.6 lb

## 2024-01-04 DIAGNOSIS — R194 Change in bowel habit: Secondary | ICD-10-CM | POA: Insufficient documentation

## 2024-01-04 DIAGNOSIS — K582 Mixed irritable bowel syndrome: Secondary | ICD-10-CM

## 2024-01-04 DIAGNOSIS — K589 Irritable bowel syndrome without diarrhea: Secondary | ICD-10-CM

## 2024-01-04 DIAGNOSIS — K5900 Constipation, unspecified: Secondary | ICD-10-CM | POA: Diagnosis not present

## 2024-01-04 DIAGNOSIS — N200 Calculus of kidney: Secondary | ICD-10-CM | POA: Diagnosis not present

## 2024-01-04 HISTORY — DX: Irritable bowel syndrome, unspecified: K58.9

## 2024-01-04 NOTE — Patient Instructions (Signed)
 Schedule  colonoscopy Obtain abdominal x-ray today If presence of stool, we will start Linzess  72 mcg every day

## 2024-01-04 NOTE — Progress Notes (Unsigned)
 Toribio Fortune, M.D. Gastroenterology & Hepatology [facility] [address] [place], [place] [address] Primary Care Physician: Tobie Suzzane POUR, MD [address] [place] [address]  Referring MD: [facility]  Chief Complaint: Change in bowel habits  History of Present Illness: [name] is a 53 y.o. female with past medical history of anxiety, CKD, depression, hypothyroidism, obesity, who presents for evaluation of changes in bowel habits.  Patient reports that since she was a teenager she has had issues with constipation. She has had fluctuation between constipation and diarrhea. States that she may skip having a BM for 2-3 days and this is followed by a regular bowel movement. Finally she may have episodes of diarrhea multiple times a day, which she has on average once a week. No regular nausea or melena. She has lower abdominal abdominal pain intermittently, which improves with having a BM.  The patient denies having any fever, chills, hematochezia, melena, hematemesis, abdominal distention, diarrhea, jaundice, pruritus. Has gained some weight recently.  5 years ago she reports having an episode of colitis after having rectal bleeding. The episode stopped after a couple of days.  Most recent labs from 12/24/23 showed CMP with creatinine 1.38, BUN 25, calcium  9.5, potassium 5.2, sodium 135, TSH 2.66.  Last ZHI:enddpaob 5 years ago at [facility], normal per patient, no report available Last Colonoscopy:06/05/2017 - Preparation of the colon was inadequate. - The rectum, recto- sigmoid colon, sigmoid colon, descending colon and splenic flexure are normal. - Stool in the transverse colon, at the hepatic flexure, in the ascending colon and in the cecum. - Random biopsies taken from mucosa of ascending and sigmoid colon. - External hemorrhoids.  FHx: neg for any gastrointestinal/liver disease, mother lung cancer Social: quit smoking 4 months ago,  neg alcohol or illicit drug use Surgical: cholecystectomy, only had non invasive urological procedures  Past Medical History: Past Medical History:  Diagnosis Date   Anxiety    Chronic kidney disease    CKD (chronic kidney disease) stage 3, GFR 30-59 ml/min (HCC) 03/15/2019   Depression    Diarrhea 05/04/2017   Essential hypertension, benign 03/15/2019   History of kidney stones    Hypertension    Hypothyroidism    Hypothyroidism, adult 03/15/2019   Kidney stones    LGSIL on Pap smear of cervix 08/17/2020   Needs colpo per [facility] immediate risk is 4.3 for CIN 3+risk    Obesity (BMI 30-39.9) 03/15/2019   Renal disorder    Vitamin D  deficiency disease 03/15/2019    Past Surgical History: Past Surgical History:  Procedure Laterality Date   BIOPSY  06/05/2017   Procedure: BIOPSY;  Surgeon: Golda Claudis PENNER, MD;  [facility]: [facility];  Service: Endoscopy;;  colon   CHOLECYSTECTOMY     COLONOSCOPY N/A 06/05/2017   Procedure: COLONOSCOPY;  Surgeon: Golda Claudis PENNER, MD;  Location: [facility];  Service: Endoscopy;  Laterality: N/A;  8:30   CYSTOSCOPY WITH RETROGRADE PYELOGRAM, URETEROSCOPY AND STENT PLACEMENT Left 10/04/2020   Procedure: CYSTOSCOPY WITH LEFT RETROGRADE PYELOGRAM, DIAGNOSTIC LEFT URETEROSCOPY AND LEFT URETEREAL STENT PLACEMENT;  Surgeon: Sherrilee Belvie CROME, MD;  Location: [facility];  Service: Urology;  Laterality: Left;   CYSTOSCOPY WITH RETROGRADE PYELOGRAM, URETEROSCOPY AND STENT PLACEMENT Left 10/16/2020   Procedure: CYSTOSCOPY WITH LEFT RETROGRADE PYELOGRAM, LEFT URETEROSCOPY  WITH LASER AND LEFT URETERAL STENT EXCHANGE;  Surgeon: Sherrilee Belvie CROME, MD;  Location: [facility];  Service: Urology;  Laterality: Left;   CYSTOSCOPY WITH  RETROGRADE PYELOGRAM, URETEROSCOPY AND STENT PLACEMENT Bilateral 02/17/2022   Procedure: CYSTOSCOPY WITH RETROGRADE PYELOGRAM, URETEROSCOPY AND STENT PLACEMENT;  Surgeon: Sherrilee Belvie CROME, MD;  Location: [facility];  Service: Urology;  Laterality:  Bilateral;   CYSTOSCOPY WITH RETROGRADE PYELOGRAM, URETEROSCOPY AND STENT PLACEMENT Left 06/26/2022   Procedure: CYSTOSCOPY WITH RETROGRADE PYELOGRAM, URETEROSCOPY AND STENT PLACEMENT;  Surgeon: Sherrilee Belvie CROME, MD;  Location: [facility];  Service: Urology;  Laterality: Left;   HOLMIUM LASER APPLICATION Left 10/16/2020   Procedure: HOLMIUM LASER APPLICATION;  Surgeon: Sherrilee Belvie CROME, MD;  Location: [facility];  Service: Urology;  Laterality: Left;   HOLMIUM LASER APPLICATION Left 06/26/2022   Procedure: HOLMIUM LASER APPLICATION;  Surgeon: Sherrilee Belvie CROME, MD;  Location: [facility];  Service: Urology;  Laterality: Left;   IR  NEPHROURETERAL CATH PLACE LEFT  08/05/2022   KIDNEY STONE SURGERY     kidney stones     LASER ABLATION CONDOLAMATA N/A 10/31/2020   Procedure: LASER ABLATION OF THE CERVIX;  Surgeon: Jayne Vonn DEL, MD;  Location: [facility];  Service: Gynecology;  Laterality: N/A;   NEPHROLITHOTOMY Left 08/07/2022   Procedure: NEPHROLITHOTOMY PERCUTANEOUS;  Surgeon: Sherrilee Belvie CROME, MD;  Location: [facility];  Service: Urology;  Laterality: Left;    Family History: Family History  Problem Relation Age of Onset   Cancer Paternal Grandmother    Heart failure Paternal Grandmother    Cancer Maternal Grandmother    Cancer Father    Heart failure Father    Lung cancer Mother    Colon cancer Neg Hx     Social History: Social History   Tobacco Use  Smoking Status Former   Current packs/day: 0.00   Average packs/day: 0.5 packs/day for 20.0 years (10.0 ttl pk-yrs)   Types: Cigarettes   Start date: 02/08/2002   Quit date: 02/08/2022   Years since quitting: 1.9  Smokeless Tobacco Never   Social History   Substance and Sexual Activity  Alcohol Use No   Social History   Substance and Sexual Activity  Drug Use No    Allergies: Allergies  Allergen Reactions   Morphine And Codeine  Itching and Hives   Sulfa Antibiotics Swelling   Tape Itching and Other (See Comments)    Surgical      Medications: Current Outpatient Medications  Medication Sig Dispense Refill   ALPRAZolam  (XANAX ) 1 MG tablet Take 1 mg by mouth 2 (two) times daily as needed for anxiety. 04/17/23 Patient reports xanax  changed from tid to bid     amLODipine  (NORVASC ) 5 MG tablet TAKE 1 TABLET BY MOUTH DAILY 30 tablet 5   ARIPiprazole  (ABILIFY ) 5 MG tablet Take 5 mg by mouth at bedtime.     atorvastatin  (LIPITOR) 20 MG tablet TAKE 1 TABLET BY MOUTH EVERY DAY 90 tablet 3   benztropine (COGENTIN) 1 MG tablet Take 1 mg by mouth 2 (two) times daily.     carvedilol  (COREG ) 25 MG tablet TAKE 1 TABLET BY MOUTH TWICE DAILY 180 tablet 2   Cholecalciferol  (VITAMIN D3) 125 MCG (5000 UT) TABS Take 10,000 Units by mouth daily.     doxepin  (SINEQUAN ) 75 MG capsule Take 225 mg by mouth at bedtime.      lamoTRIgine  (LAMICTAL ) 200 MG tablet Take 200 mg by mouth 2 (two) times daily.     losartan  (COZAAR ) 25 MG tablet Take 25 mg by mouth in the morning.     mirabegron  ER (MYRBETRIQ ) 50 MG TB24 tablet Take 1 tablet (50 mg total)  by mouth daily. At bedtime 30 tablet 11   nitrofurantoin  (MACRODANTIN ) 50 MG capsule Take 50 mg by mouth 4 (four) times daily. (Patient taking differently: Take 50 mg by mouth at bedtime.)     potassium citrate  (UROCIT-K ) 10 MEQ (1080 MG) SR tablet Take 20 mEq by mouth in the morning and at bedtime.     QUEtiapine  (SEROQUEL  XR) 200 MG 24 hr tablet Take 400 mg by mouth at bedtime.     No current facility-administered medications for this visit.    Review of Systems: GENERAL: negative for malaise, night sweats HEENT: No changes in hearing or vision, no nose bleeds or other nasal problems. NECK: Negative for lumps, goiter, pain and significant neck swelling RESPIRATORY: Negative for cough, wheezing CARDIOVASCULAR: Negative for chest pain, leg swelling, palpitations, orthopnea GI: SEE HPI MUSCULOSKELETAL: Negative for joint pain or swelling, back pain, and muscle pain. SKIN: Negative for lesions,  rash PSYCH: Negative for sleep disturbance, mood disorder and recent psychosocial stressors. HEMATOLOGY Negative for prolonged bleeding, bruising easily, and swollen nodes. ENDOCRINE: Negative for cold or heat intolerance, polyuria, polydipsia and goiter. NEURO: negative for tremor, gait imbalance, syncope and seizures. The remainder of the review of systems is noncontributory.   Physical Exam: BP 136/82 (BP Location: Left Arm, Patient Position: Sitting, Cuff Size: Normal)   Pulse 93   Temp (!) 97.1 F (36.2 C) (Temporal)   Ht 5' 2.5 (1.588 m)   Wt 235 lb 9.6 oz (106.9 kg)   LMP  (LMP Unknown)   BMI 42.41 kg/m  GENERAL: The patient is AO x3, in no acute distress. HEENT: Head is normocephalic and atraumatic. EOMI are intact. Mouth is well hydrated and without lesions. NECK: Supple. No masses LUNGS: Clear to auscultation. No presence of rhonchi/wheezing/rales. Adequate chest expansion HEART: RRR, normal s1 and s2. ABDOMEN: Soft, nontender, no guarding, no peritoneal signs, and nondistended. BS +. No masses. EXTREMITIES: Without any cyanosis, clubbing, rash, lesions or edema. NEUROLOGIC: AOx3, no focal motor deficit. SKIN: no jaundice, no rashes   Imaging/Labs: as above  I personally reviewed and interpreted the available labs, imaging and endoscopic files.  Impression and Plan: [name] is a 53 y.o. female with past medical history of anxiety, CKD, depression, hypothyroidism, obesity, who presents for evaluation of changes in bowel habits.  The patient has presented fluctuation in her bowel movement frequency for multiple years without red flag signs.  Nevertheless, her most recent colonoscopy showed presence of large amount of stool.  Due to this, I am suspicious that she may have some overflow diarrhea episodes in the setting of IBS-C.  Will evaluate for her stool burden with a KUB and if there is presence of stool, we will start her on Linzess 72 mcg every day.  She is  due for colorectal cancer screening, for which she will be scheduled for colonoscopy.  -Schedule  colonoscopy -Obtain abdominal x-ray today -If presence of stool and KUB, we will start Linzess 72 mcg every day  All questions were answered.      Toribio Fortune, MD Gastroenterology and Hepatology [facility]

## 2024-01-07 ENCOUNTER — Encounter: Payer: Self-pay | Admitting: *Deleted

## 2024-01-07 ENCOUNTER — Other Ambulatory Visit: Payer: Self-pay | Admitting: *Deleted

## 2024-01-07 ENCOUNTER — Telehealth: Payer: Self-pay | Admitting: *Deleted

## 2024-01-07 MED ORDER — SUTAB 1479-225-188 MG PO TABS: 12.0000 | ORAL_TABLET | ORAL | 0 refills | Status: DC

## 2024-01-07 NOTE — Telephone Encounter (Signed)
 [facility] PA: Doesn't require submission in most cases [phone] Colonoscopy, flexible; diagnostic, including collection of specimen(s) by brushing or washing, when performed (separate procedure)

## 2024-01-08 DIAGNOSIS — I10 Essential (primary) hypertension: Secondary | ICD-10-CM | POA: Diagnosis not present

## 2024-01-08 DIAGNOSIS — R531 Weakness: Secondary | ICD-10-CM | POA: Diagnosis not present

## 2024-01-08 DIAGNOSIS — W19XXXA Unspecified fall, initial encounter: Secondary | ICD-10-CM | POA: Diagnosis not present

## 2024-01-13 ENCOUNTER — Ambulatory Visit (INDEPENDENT_AMBULATORY_CARE_PROVIDER_SITE_OTHER): Payer: Self-pay | Admitting: Gastroenterology

## 2024-01-13 ENCOUNTER — Other Ambulatory Visit (INDEPENDENT_AMBULATORY_CARE_PROVIDER_SITE_OTHER): Payer: Self-pay | Admitting: Gastroenterology

## 2024-01-13 MED ORDER — LINACLOTIDE 72 MCG PO CAPS: 72.0000 ug | ORAL_CAPSULE | Freq: Every day | ORAL | 3 refills | Status: DC

## 2024-01-13 NOTE — Telephone Encounter (Signed)
 Pt is cancelling procedure on 01/19/24 due to having some neurological issues going on. She will be seeing neurology 01/18/24 and didn't want to be doing prep while going to the 5 hour visit. She will call back to reschedule once seeing neurology. Message sent to endo to cancel procedure. FYI

## 2024-01-14 ENCOUNTER — Encounter (HOSPITAL_COMMUNITY): Admission: RE | Admit: 2024-01-14 | Source: Ambulatory Visit

## 2024-01-15 ENCOUNTER — Encounter: Payer: Self-pay | Admitting: Psychology

## 2024-01-15 DIAGNOSIS — F32A Depression, unspecified: Secondary | ICD-10-CM | POA: Insufficient documentation

## 2024-01-15 DIAGNOSIS — F419 Anxiety disorder, unspecified: Secondary | ICD-10-CM | POA: Insufficient documentation

## 2024-01-18 ENCOUNTER — Ambulatory Visit: Admitting: Psychology

## 2024-01-18 ENCOUNTER — Ambulatory Visit: Payer: Self-pay

## 2024-01-18 ENCOUNTER — Encounter: Payer: Self-pay | Admitting: Psychology

## 2024-01-18 DIAGNOSIS — R419 Unspecified symptoms and signs involving cognitive functions and awareness: Secondary | ICD-10-CM | POA: Diagnosis not present

## 2024-01-18 DIAGNOSIS — F0670 Mild neurocognitive disorder due to known physiological condition without behavioral disturbance: Secondary | ICD-10-CM

## 2024-01-18 DIAGNOSIS — R4189 Other symptoms and signs involving cognitive functions and awareness: Secondary | ICD-10-CM

## 2024-01-18 NOTE — Progress Notes (Signed)
   Psychometrician Note   Cognitive testing was administered to [name] by Lonell Jude, B.S. (psychometrist) under the supervision of Dr. Arthea KYM Maryland, Ph.D., ABPP, licensed psychologist on 01/18/2024. [name] did not appear overtly distressed by the testing session per behavioral observation or responses across self-report questionnaires. Rest breaks were offered.    The battery of tests administered was selected by Dr. Zachary C. Merz, Ph.D., ABPP with consideration to [name]'s current level of functioning, the nature of her symptoms, emotional and behavioral responses during interview, level of literacy, observed level of motivation/effort, and the nature of the referral question. This battery was communicated to the psychometrist. Communication between Dr. Arthea KYM Maryland, Ph.D., ABPP and the psychometrist was ongoing throughout the evaluation and Dr. Arthea KYM Maryland, Ph.D., ABPP was immediately accessible at all times. Dr. Zachary C. Merz, Ph.D., ABPP provided supervision to the psychometrist on the date of this service to the extent necessary to assure the quality of all services provided.    [name] will return within approximately 1-2 weeks for an interactive feedback session with Dr. Maryland at which time her test performances, clinical impressions, and treatment recommendations will be reviewed in detail. [name] understands she can contact our office should she require our assistance before this time.  A total of 195 minutes of billable time were spent face-to-face with [name] by the psychometrist. This includes both test administration and scoring time. Billing for these services is reflected in the clinical report generated by Dr. Arthea KYM Maryland, Ph.D., ABPP  This note reflects time spent with the psychometrician and does not include test scores or any clinical interpretations made by Dr. Maryland. The full report will follow in a separate note.

## 2024-01-18 NOTE — Progress Notes (Signed)
 NEUROPSYCHOLOGICAL EVALUATION [facility]  Department of Neurology  Date of Evaluation: January 18, 2024  Reason for Referral:   [name] is a 53 y.o. right-handed Caucasian female referred by Darice Shivers, M.D., to characterize her current cognitive functioning and assist with diagnostic clarity and treatment planning in the context of subjective cognitive decline.   Assessment and Plan:   Clinical Impression(s): Performance across stand-alone and embedded performance validity indicators were variable but often below expectation. As such, the results of the current evaluation should not be taken at face value as there is a strong chance that obtained scores underestimate [name]'s true cognitive abilities. To be clear, I do not feel that [name] was attempting to perform poorly or purposefully disengaging herself from the testing process. She was an active participant, appeared engaged, persisted reasonably well, and completed all tasks asked of her. As such, I do not have suspicion for a malingering presentation. Rather, I believe that [name] was highly distractible and ultimately unable to appropriately focus on test materials as they were presented to her, ultimately artificially suppressing obtained test scores.  Due to validity concerns across testing, I am limited in offering diagnostic clarity regarding the cause for ongoing dysfunction. When speaking more broadly, there are several factors currently in play which can create significant cognitive dysfunction which should be actively considered by her medical team in my opinion. Across mood-related questionnaires, [name] reported acute symptoms of severe anxiety and severe depression, certainly to the extent where symptoms could impair attention/concentration, creating validity concerns across testing and very real deficits in her day-to-day life.  Additionally, another primary consideration  should be concerns regarding polypharmacy. Both [name] and his sister expressed strong concerns surrounding [name] being overmedicated. According to [name]'s current medication list within her electronic chart, she is prescribed several medications with well established cognitive side effects, as well as higher rates of sedation/somnolence and deficits in motor coordination. Most notable of these include alprazolam /Xanax , lamotrigine /Lamictal , quetiapine /Seroquel , doxepin /Sinequan , and aripiprazole /Abilify . I am not personally qualified to determine if [name] is indeed being overmedicated or not and this would need to come from her medical team, especially her psychiatrist. However, [name]'s presentation does resemble what can be seen in these instances, including a significant reduction in processing speed and sustained attention, as well as increased distractibility and her frequently losing her train of thought. As such, this remains a potential contributory factor for her clinical presentation and should be investigated more thoroughly.   Neurologically speaking, I am unable to rule out an underlying neurodegenerative illness. However, test validity concerns limit a more targeted discussion, making it not possible to currently rule in any illness either. Based on population based rates, the presence of a neurodegenerative illness such as typically presenting Alzheimer's disease at 53 years old would be exceptionally rare. There are rare variants of this illness such as posterior cortical atrophy (PCA) which do occur at younger ages. Severe agrammatism across writing samples and and verbal fluency decline could also be worrisome for a primary progressive aphasia presentation. Further laboratory testing would be required to rule in or out conditions such as these. [name] does not exhibit classic behavioral characteristics for other neurodegenerative illnesses such as frontotemporal lobar  degeneration or Lewy body disease. Frequent falling and instability concerns can be seen in the parkinsonian family. However, these may also be caused by medication side effects rather than a neurological illness. Side effects from Abilify , for  example, can bring about tremors and parkinsonian symptoms. Her most recent brain MRI (2024) was motion degraded but unremarkable and not suggestive of any concerning features by itself. Continued medical monitoring will be important moving forward.   Recommendations: Given concerns for polypharmacy, [name] may benefit from a pharmacy consult to better understand her medications, side effects, and possible interactions. I will place a referral for her should she be interested in this. I would also strongly recommend that she go over all medications, their purpose, and their dosing with her psychiatrist to ensure appropriate understanding.   Should there be concerns for an underlying neurodegenerative illness, additional laboratory testing will be required. A lumbar puncture or FDG-PET scan would be most beneficial. If there are more specific concerns for a parkinsonian illness, a DaTscan or alpha-synuclein skin biopsy could be considered. At least one medication (Abilify ) would be contraindicated with a DaTscan procedure as this can interfere with accurate results.  Should any abnormalities show up across follow-up laboratory testing or [name]'s medication regimen change in the future, repeat testing would be warranted at that time. This should be no sooner than 12 months from the current date.   Performance across neurocognitive testing is not a strong predictor of an individual's safety operating a motor vehicle. Should her family wish to pursue a formalized driving evaluation, they could reach out to the following agencies: [employer] in [place]: [phone] [facility]: [phone] [facility]:  [phone] [facility]: [phone] or [phone]  Should there be progression of current deficits over time, [name] is unlikely to regain any independent living skills lost. Therefore, it is recommended that she remain as involved as possible in all aspects of household chores, finances, and medication management, with supervision to ensure adequate performance. She will likely benefit from the establishment and maintenance of a routine in order to maximize her functional abilities over time.  It will be important for Ms. Conkey to have another person with her when in situations where she may need to process information, weigh the pros and cons of different options, and make decisions, in order to ensure that she fully understands and recalls all information to be considered.  Ms. Frese is encouraged to attend to lifestyle factors for brain health (e.g., regular physical exercise, good nutrition habits and consideration of the MIND-DASH diet, regular participation in cognitively-stimulating activities, and general stress management techniques), which are likely to have benefits for both emotional adjustment and cognition. In fact, in addition to promoting good general health, regular exercise incorporating aerobic activities (e.g., brisk walking, jogging, cycling, etc.) has been demonstrated to be a very effective treatment for depression and stress, with similar efficacy rates to both antidepressant medication and psychotherapy. Optimal control of vascular risk factors (including safe cardiovascular exercise and adherence to dietary recommendations) is encouraged. Continued participation in activities which provide mental stimulation and social interaction is also recommended.   Important information should be provided to Ms. Rybacki in written format in all instances. This information should be placed in a highly frequented and easily visible location within her home to promote recall. External  strategies such as written notes in a consistently used memory journal, visual and nonverbal auditory cues such as a calendar on the refrigerator or appointments with alarm, such as on a cell phone, can also help maximize recall.  Memory can be improved using internal strategies such as rehearsal, repetition, chunking, mnemonics, association, and imagery. External strategies such as written notes in a  consistently used memory journal, visual and nonverbal auditory cues such as a calendar on the refrigerator or appointments with alarm, such as on a cell phone, can also help maximize recall.    When learning new information, she would benefit from information being broken up into small, manageable pieces. She may also find it helpful to articulate the material in her own words and in a context to promote encoding at the onset of a new task. This material may need to be repeated multiple times to promote encoding.  To address problems with processing speed, she may wish to consider:   -Ensuring that she is alerted when essential material or instructions are being presented   -Adjusting the speed at which new information is presented   -Allowing for more time in comprehending, processing, and responding in conversation   -Repeating and paraphrasing instructions or conversations aloud  To address problems with fluctuating attention and/or executive dysfunction, she may wish to consider:   -Avoiding external distractions when needing to concentrate   -Limiting exposure to fast paced environments with multiple sensory demands   -Writing down complicated information and using checklists   -Attempting and completing one task at a time (i.e., no multi-tasking)   -Verbalizing aloud each step of a task to maintain focus   -Taking frequent breaks during the completion of steps/tasks to avoid fatigue   -Reducing the amount of information considered at one time   -Scheduling more difficult activities for a time  of day where she is usually most alert  Review of Records:   Past Medical History:  Diagnosis Date   Acute cystitis without hematuria 08/01/2022   Anxiety    Bipolar disorder in partial remission 09/03/2021   Chronic pain of both knees 09/24/2023   Chronic pain of both shoulders 09/24/2023   CKD (chronic kidney disease) stage 3, GFR 30-59 ml/min 03/15/2019   Depression    Diarrhea 05/04/2017   Enlarged uterus 08/06/2020   Essential hypertension 03/15/2019   History of kidney stones    IBS (irritable bowel syndrome) 01/04/2024   LGSIL on Pap smear of cervix 08/17/2020   Needs colpo per [facility]P immediate risk is 4.3 for CIN 3+risk    Mixed hyperlipidemia 09/05/2021   Morbid obesity    Recurrent falls 04/04/2022   Recurrent nephrolithiasis 09/03/2021   Subclinical hypothyroidism 03/15/2019   Tardive dyskinesia 11/21/2022   Vasovagal syncope 12/30/2021   Vitamin D  deficiency 03/15/2019    Past Surgical History:  Procedure Laterality Date   BIOPSY  06/05/2017   Procedure: BIOPSY;  Surgeon: Golda Claudis PENNER, MD;  Location: [facility];  Service: Endoscopy;;  colon   CHOLECYSTECTOMY     COLONOSCOPY N/A 06/05/2017   Procedure: COLONOSCOPY;  Surgeon: Golda Claudis PENNER, MD;  Location: [facility];  Service: Endoscopy;  Laterality: N/A;  8:30   CYSTOSCOPY WITH RETROGRADE PYELOGRAM, URETEROSCOPY AND STENT PLACEMENT Left 10/04/2020   Procedure: CYSTOSCOPY WITH LEFT RETROGRADE PYELOGRAM, DIAGNOSTIC LEFT URETEROSCOPY AND LEFT URETEREAL STENT PLACEMENT;  Surgeon: Sherrilee Belvie CROME, MD;  Location: [facility];  Service: Urology;  Laterality: Left;   CYSTOSCOPY WITH RETROGRADE PYELOGRAM, URETEROSCOPY AND STENT PLACEMENT Left 10/16/2020   Procedure: CYSTOSCOPY WITH LEFT RETROGRADE PYELOGRAM, LEFT URETEROSCOPY  WITH LASER AND LEFT URETERAL STENT EXCHANGE;  Surgeon: Sherrilee Belvie CROME, MD;  Location: [facility];  Service: Urology;  Laterality: Left;   CYSTOSCOPY WITH RETROGRADE PYELOGRAM, URETEROSCOPY AND  STENT PLACEMENT Bilateral 02/17/2022   Procedure: CYSTOSCOPY WITH RETROGRADE PYELOGRAM, URETEROSCOPY AND STENT PLACEMENT;  Surgeon: Sherrilee Belvie CROME, MD;  Location: [facility];  Service: Urology;  Laterality: Bilateral;   CYSTOSCOPY WITH RETROGRADE PYELOGRAM, URETEROSCOPY AND STENT PLACEMENT Left 06/26/2022   Procedure: CYSTOSCOPY WITH RETROGRADE PYELOGRAM, URETEROSCOPY AND STENT PLACEMENT;  Surgeon: Sherrilee Belvie CROME, MD;  Location: [facility];  Service: Urology;  Laterality: Left;   HOLMIUM LASER APPLICATION Left 10/16/2020   Procedure: HOLMIUM LASER APPLICATION;  Surgeon: Sherrilee Belvie CROME, MD;  Location: [facility];  Service: Urology;  Laterality: Left;   HOLMIUM LASER APPLICATION Left 06/26/2022   Procedure: HOLMIUM LASER APPLICATION;  Surgeon: Sherrilee Belvie CROME, MD;  Location: [facility];  Service: Urology;  Laterality: Left;   IR  NEPHROURETERAL CATH PLACE LEFT  08/05/2022   KIDNEY STONE SURGERY     kidney stones     LASER ABLATION CONDOLAMATA N/A 10/31/2020   Procedure: LASER ABLATION OF THE CERVIX;  Surgeon: Jayne Vonn DEL, MD;  Location: [facility];  Service: Gynecology;  Laterality: N/A;   NEPHROLITHOTOMY Left 08/07/2022   Procedure: NEPHROLITHOTOMY PERCUTANEOUS;  Surgeon: Sherrilee Belvie CROME, MD;  Location: [facility];  Service: Urology;  Laterality: Left;    Current Outpatient Medications:    ALPRAZolam  (XANAX ) 1 MG tablet, Take 1 mg by mouth 2 (two) times daily as needed for anxiety. 04/17/23 Patient reports xanax  changed from tid to bid, Disp: , Rfl:    amLODipine  (NORVASC ) 5 MG tablet, TAKE 1 TABLET BY MOUTH DAILY, Disp: 30 tablet, Rfl: 5   ARIPiprazole  (ABILIFY ) 5 MG tablet, Take 5 mg by mouth at bedtime., Disp: , Rfl:    atorvastatin  (LIPITOR) 20 MG tablet, TAKE 1 TABLET BY MOUTH EVERY DAY, Disp: 90 tablet, Rfl: 3   benztropine (COGENTIN) 1 MG tablet, Take 1 mg by mouth 2 (two) times daily., Disp: , Rfl:    carvedilol  (COREG ) 25 MG tablet, TAKE 1 TABLET BY MOUTH TWICE DAILY, Disp: 180 tablet, Rfl: 2    Cholecalciferol  (VITAMIN D3) 125 MCG (5000 UT) TABS, Take 10,000 Units by mouth daily., Disp: , Rfl:    doxepin  (SINEQUAN ) 75 MG capsule, Take 225 mg by mouth at bedtime. , Disp: , Rfl:    lamoTRIgine  (LAMICTAL ) 200 MG tablet, Take 200 mg by mouth 2 (two) times daily., Disp: , Rfl:    linaclotide  (LINZESS ) 72 MCG capsule, Take 1 capsule (72 mcg total) by mouth daily before breakfast., Disp: 90 capsule, Rfl: 3   losartan  (COZAAR ) 25 MG tablet, Take 25 mg by mouth in the morning., Disp: , Rfl:    mirabegron  ER (MYRBETRIQ ) 50 MG TB24 tablet, Take 1 tablet (50 mg total) by mouth daily. At bedtime, Disp: 30 tablet, Rfl: 11   nitrofurantoin  (MACRODANTIN ) 50 MG capsule, Take 50 mg by mouth 4 (four) times daily. (Patient taking differently: Take 50 mg by mouth at bedtime.), Disp: , Rfl:    potassium citrate  (UROCIT-K ) 10 MEQ (1080 MG) SR tablet, Take 20 mEq by mouth in the morning and at bedtime., Disp: , Rfl:    QUEtiapine  (SEROQUEL  XR) 200 MG 24 hr tablet, Take 400 mg by mouth at bedtime., Disp: , Rfl:    Sodium Sulfate-Mag Sulfate-KCl (SUTAB ) [phone] MG TABS, Take 12 tablets by mouth as directed., Disp: 24 tablet, Rfl: 0     05/18/2022    9:00 PM  MMSE - Mini Mental State Exam  Orientation to time 4  Orientation to Place 5  Registration 3  Attention/ Calculation 5  Recall 3  Language- name 2 objects 2  Language- repeat 1  Language-  follow 3 step command 3  Language- read & follow direction 1  Write a sentence 1  Copy design 1  Total score 29        09/16/2021    4:10 PM 09/16/2019   10:36 AM  6CIT Screen  What Year? 0 points 0 points  What month? 0 points 0 points  What time? 0 points 0 points  Count back from 20 0 points 0 points  Months in reverse  0 points  Repeat phrase 4 points 2 points  Total Score  2 points   Neuroimaging: 1-hour EEG on 05/20/2022 was normal. Brain MRI on 06/09/2022 was moderately motion degraded but overall unremarkable. Ambulatory 26-hour EEG on  06/23/2022 was normal.   Clinical Interview:   The following information was obtained during a clinical interview with Ms. Rohner and her sister prior to cognitive testing.  Cognitive Symptoms: Decreased short-term memory: Endorsed. Her most salient concern surrounded frequently losing her train of thought. She also acknowledged trouble recalling names and details of recent conversations, as well as commonly misplacing things in her environment. Difficulties were said to have progressively worsened over the past 2-3 years. Her sister noted a more prominent change over the past several months.  Decreased long-term memory: Denied. Decreased attention/concentration: Endorsed. In addition to frequently losing her train of thought, she also described more broad difficulties with sustained attention and increased distractibility. Difficulties were said to be present for the past several years. She did not report longstanding attentional concerns dating back to adolescence.  Reduced processing speed: Endorsed sometimes. Difficulties with executive functions: Endorsed. She reported some trouble with organization and multi-tasking at times. Both she and her sister denied trouble with impulsivity or any significant personality changes.  Difficulties with emotion regulation: Denied. Difficulties with receptive language: Denied. Difficulties with word finding: Endorsed. Decreased visuoperceptual ability: Denied.  Difficulties completing ADLs: Somewhat. Ms. Woodstock lives with her father. She reported independence with financial management, bill paying, and medication management. Her sister highlighted concerns surrounding the latter, noting occasional instances where Ms. Markovic may mix up daytime and nighttime medications. She continues to drive and denied concerns. Her sister noted some navigational decline but denied any known safety concerns.   Additional Medical History: History of traumatic brain  injury/concussion: Denied. History of stroke: Denied. History of seizure activity: Denied. History of known exposure to toxins: Denied. Symptoms of chronic pain: She reported manageable pain involving her knees and shoulders, assumed to be due to osteoarthritis.  Experience of frequent headaches/migraines: Denied. Frequent instances of dizziness/vertigo: Denied.  Sensory changes: She wears glasses with benefit. Other sensory changes/difficulties (i.e., hearing, taste, smell) were denied.  Balance/coordination difficulties: She has been seen by Neurology for frequent falling behaviors in the past. Recently, these behaviors have been well managed. She reported a fall the previous day. Her sister noted two falls within the past week. Per her sister, there were concerns that Ms. Becknell mixed up daytime and nighttime medications which led to instability. No injuries were reported.  Other motor difficulties: Denied. Prior concerns surrounding tardive dyskinesia have greatly improved with medication adjustment.  Sleep History: Estimated hours obtained each night: 8 hours.  Difficulties falling asleep: Denied. Difficulties staying asleep: Denied. Feels rested and refreshed upon awakening: Variably so. She reported waking groggy at times.   History of snoring: Denied. History of waking up gasping for air: Denied. Witnessed breath cessation while asleep: Denied.  History of vivid dreaming: Endorsed. She reported occasionally waking following vivid dreaming and exhibiting transient  confusion surrounding reality versus dream content.  Excessive movement while asleep: Denied. Instances of acting out her dreams: Denied.  Psychiatric/Behavioral Health History: Depression: She described her current mood as not very good. When asked, she denied outright symptoms of depression however. She ultimately had difficulty articulating current mood concerns. Both she and her sister expressed concern surrounding  her being overmedicated. Ms. Cupit is seen by a psychiatrist for medication management. She was ultimately unsure if current management was appropriate or helpful.  Anxiety: Largely denied. However, her sister did express her belief that Ms. Lavallee was experiencing some acute testing anxiety due to the current evaluation.  Mania: She has a history of bipolar disorder. She was unsure when her last manic/hypomanic episode was and was unsure if this condition was currently being managed well. She reported recently stopping her depo shot due to her feeling that it was no longer necessary.  Trauma History: Denied. Visual/auditory hallucinations: Denied. Delusional thoughts: Denied.  Tobacco: She reported smoking about 1/2 pack of cigarettes daily.  Alcohol: She denied current alcohol consumption as well as a history of problematic alcohol abuse or dependence.  Recreational drugs: Denied.  Family History: Problem Relation Age of Onset   Lung cancer Mother    Cancer Father    Heart failure Father    Cancer Maternal Grandmother    Cancer Paternal Grandmother    Heart failure Paternal Grandmother    Bipolar disorder Maternal Aunt    Colon cancer Neg Hx    This information was confirmed by Ms. Orihuela.  Academic/Vocational History: Highest level of educational attainment: 14 years. She graduated from high school and reported completing two additional years of college. 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 No relative weaknesses were reported.  History of developmental delay: Denied. History of grade repetition: Denied. Enrollment in special education courses: Denied. History of LD/ADHD: Denied.  Employment: She currently receives disability benefits due to her history of bipolar disorder and psychiatric concerns. 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   Evaluation Results:   Behavioral Observations: Ms. Viles was accompanied by her  sister, arrived to her appointment on time, and was appropriately dressed and groomed. She appeared alert. Observed gait and station were within normal limits. Gross motor functioning appeared intact upon informal observation and no abnormal movements (e.g., tremors) were noted. Her affect was generally relaxed and positive, but did range appropriately given the subject being discussed during interview. Spontaneous speech was fluent. She was noted to frequently lose her train of thought and word finding difficulties were also observed during interview. When losing her train of thought, she would require the initial question to be re-asked. Thought processes were otherwise coherent and normal in content. Insight into her cognitive difficulties was difficult to determine given test validity concerns. However, she may not fully appreciate the extent of ongoing impairment.   During testing, sustained attention appeared quite compromised at times. She struggled understanding task instructions and needed said instructions repeated on numerous occasions. Some performances were unexpected and typically seen across invalid testing. For example, across a verbal fluency task, when asked to provide words that began with the letter 'F,' provided words included giraffe, grave yard, gun, hang, and ice. When asked to provide words that began with 'S,' she included air conditioner. When asked to provide names of fruit, she responded with sea. Task engagement was adequate. Overall, Ms. Throckmorton was cooperative with the clinical interview and subsequent testing procedures.  Adequacy of Effort: The validity of neuropsychological testing is limited by the extent to which the individual being tested may be assumed to have exerted adequate effort during testing. Ms. Mayr expressed her intention to perform to the best of her abilities and exhibited adequate task engagement and persistence. Scores across stand-alone and  embedded performance validity measures were variable but often below expectation. As such, the results of the current evaluation should not be taken at face value as there is a strong chance that obtained scores underestimate Ms. Cathy's true cognitive abilities. To be clear, I do not feel that Ms. Chisom was attempting to perform poorly or purposefully disengaging herself from the testing process. She was an active participant, appeared engaged, persisted reasonably well, and completed all tasks asked of her. As such, I do not have suspicion for a malingering presentation. Rather, I believe that Ms. Latka was highly distractible and ultimately unable to appropriately focus on test materials as they were presented to her, ultimately artificially suppressing obtained test scores.  Test Results: Ms. Arteaga was fully oriented at the time of the current evaluation.  Intellectual abilities based upon educational and vocational attainment were estimated to be in the average range. Premorbid abilities were estimated to be within the below average range based upon a single-word reading test.   Processing speed was exceptionally low to below average. Basic attention was average. More complex attention (e.g., working memory) was exceptionally low. Executive functioning was exceptionally low.  Assessed receptive language abilities were exceptionally low. She had trouble answering basic conceptual questions such as pointing to the number which reflects the number of hours is a day or pointing to a shape that is the color of the sky. She also had trouble sequencing commands, following multi-step commands, and understanding more complex sentence structure. Assessed expressive language was somewhat variable. Verbal fluency (both phonemic and semantic was exceptionally low. Confrontation was variable, ranging from the exceptionally low to average normative ranges. Severe agrammatism was exhibited across writing samples  (grill out or dog bark).   Assessed visuospatial/visuoconstructional abilities were exceptionally low. Across her drawing of a clock, numbers 1 and 8 were duplicated. There were also notable abnormalities in numerical spacing. She further exhibited significant confusion and was unable to correctly place the clock hands. Across her copy of a complex figure, she was unable to draw the outer rectangle or any interior lines. She exhibited a severely fragmented approach, ultimately drawing a few interior aspects without any clearly observable pattern or structure.    Learning (i.e., encoding) of novel verbal information was exceptionally low. Spontaneous delayed recall (i.e., retrieval) of previously learned information was also exceptionally low. Retention rates were 0% across a list learning task, 67% (raw score of 2) across a story learning task, and 0% across a figure drawing task. Performance across recognition tasks was variable. Despite being a perfect 20/20 across a list-based recognition test, she was unable to answer any items correct across story and figure-based tasks, suggesting limited evidence for information consolidation.   Results of emotional screening instruments suggested that recent symptoms of generalized anxiety were in the severe range, while symptoms of depression were also within the severe range. A screening instrument assessing recent sleep quality suggested the presence of mild sleep dysfunction.  Table of Scores:   Note: This summary of test scores accompanies the interpretive report and should not be considered in isolation without reference to the appropriate sections in the text. Descriptors are based on appropriate normative data and may  be adjusted based on clinical judgment. Terms such as Within Normal Limits and Outside Normal Limits are used when a more specific description of the test score cannot be determined.       Percentile - Normative Descriptor > 98 -  Exceptionally High 91-97 - Well Above Average 75-90 - Above Average 25-74 - Average 9-24 - Below Average 2-8 - Well Below Average < 2 - Exceptionally Low       Validity:   DESCRIPTOR       TOMM: --- --- ---    T1 --- --- Outside Normal Limits    T2 --- --- Within Normal Limits    Retention --- --- Within Normal Limits  ACS WC: --- --- Outside Normal Limits  DCT: --- --- Outside Normal Limits  RBANS EI: --- --- Within Normal Limits       Orientation:      Raw Score Percentile   NAB Orientation, Form 1 29/29 --- ---       Cognitive Screening:      Raw Score Percentile   SLUMS: 8/30 --- ---       RBANS, Form A: Standard Score/ Scaled Score Percentile   Total Score --- --- ---  Immediate Memory 44 <1 Exceptionally Low    List Learning 1 <1 Exceptionally Low    Story Memory 2 <1 Exceptionally Low  Visuospatial/Constructional --- --- ---    Figure Copy 1 <1 Exceptionally Low    Line Orientation Discontinued --- Impaired  Language 71 3 Well Below Average    Picture Naming 10/10 51-75 Average    Semantic Fluency 1 <1 Exceptionally Low  Attention 49 <1 Exceptionally Low    Digit Span 4 2 Well Below Average    Coding 1 <1 Exceptionally Low  Delayed Memory 77 6 Well Below Average    List Recall 0/10 <2 Exceptionally Low    List Recognition 20/20 51-75 Average    Story Recall 2 <1 Exceptionally Low    Story Recognition 0/12 <1 Exceptionally Low    Figure Recall 1 <1 Exceptionally Low    Figure Recognition 0/8 1 Exceptionally Low        Intellectual Functioning:      Standard Score Percentile   Test of Premorbid Functioning: 81 10 Below Average       Attention/Executive Function:     Trail Making Test (TMT): Raw Score (T Score) Percentile     Part A 232 secs.,  0 errors (<19) <1 Exceptionally Low    Part B Discontinued --- Impaired         Scaled Score Percentile   WAIS-5 Coding: 1 <1 Exceptionally Low  WAIS-5 Naming Speed Quantity: 6 9 Below Average        Scaled  Score Percentile   WAIS-5 Digits Forwards: 8 25 Average  WAIS-5 Digits Backwards: 2 <1 Exceptionally Low        Scaled Score Percentile   WAIS-5 Similarities: 1 <1 Exceptionally Low  WAIS-5 Figure Weights: 3 1 Exceptionally Low       D-KEFS Verbal Fluency Test: Raw Score (Scaled Score) Percentile     Letter Total Correct 13 (3) 1 Exceptionally Low    Category Total Correct 15 (1) <1 Exceptionally Low    Category Switching Total Correct 0 (1) <1 Exceptionally Low    Category Switching Accuracy 0 (1) <1 Exceptionally Low      Total Set Loss Errors 7 (4) 2 Well Below Average      Total Repetition  Errors 0 (13) 84 Above Average       Language:     Verbal Fluency Test: Raw Score (T Score) Percentile     Phonemic Fluency (FAS) 13 (21) <1 Exceptionally Low    Animal Fluency 9 (21) <1 Exceptionally Low        NAB Language Module, Form 1: T Score Percentile     Auditory Comprehension 19 <1 Exceptionally Low    Naming 21/31 (19) <1 Exceptionally Low    Writing 19 <1 Exceptionally Low       Visuospatial/Visuoconstruction:      Raw Score Percentile   Clock Drawing: 4/10 --- Impaired        Scaled Score Percentile   WAIS-5 Block Design: 2 <1 Exceptionally Low       Mood and Personality:      Raw Score Percentile   Beck Depression Inventory - II: 42 --- Severe  PROMIS Anxiety Questionnaire: 29 --- Severe       Additional Questionnaires:      Raw Score Percentile   PROMIS Sleep Disturbance Questionnaire: 25 --- Mild   Informed Consent and Coding/Compliance:   The current evaluation represents a clinical evaluation for the purposes previously outlined by the referral source and is in no way reflective of a forensic evaluation.   Ms. Runions was provided with a verbal description of the nature and purpose of the present neuropsychological evaluation. Also reviewed were the foreseeable risks and/or discomforts and benefits of the procedure, limits of confidentiality, and mandatory  reporting requirements of this provider. The patient was given the opportunity to ask questions and receive answers about the evaluation. Oral consent to participate was provided by the patient.   This evaluation was conducted by Arthea KYM Maryland, Ph.D., ABPP-CN, board certified clinical neuropsychologist. Ms. Obi completed a clinical interview with Dr. Maryland, billed as one unit [id number], and 195 minutes of cognitive testing and scoring, billed as one unit [phone] and six additional units 96139. Psychometrist Lonell Jude, B.S. assisted Dr. Maryland with test administration and scoring procedures. As a separate and discrete service, one unit [id number] and two units 96133 (160 minutes) were billed for Dr. Loralee time spent in interpretation and report writing.

## 2024-01-19 ENCOUNTER — Encounter (HOSPITAL_COMMUNITY): Admission: RE | Payer: Self-pay | Source: Home / Self Care

## 2024-01-19 ENCOUNTER — Ambulatory Visit (HOSPITAL_COMMUNITY): Admission: RE | Admit: 2024-01-19 | Source: Home / Self Care | Admitting: Gastroenterology

## 2024-01-19 SURGERY — COLONOSCOPY
Anesthesia: Choice

## 2024-01-27 ENCOUNTER — Ambulatory Visit: Admitting: Psychology

## 2024-01-27 ENCOUNTER — Ambulatory Visit: Admitting: Nutrition

## 2024-01-27 DIAGNOSIS — G3184 Mild cognitive impairment, so stated: Secondary | ICD-10-CM | POA: Diagnosis not present

## 2024-01-27 NOTE — Progress Notes (Signed)
   Neuropsychology Feedback Session Jolynn DEL. [facility] [place] Department of Neurology  Reason for Referral:   CINTIA GLEED is a 53 y.o. right-handed Caucasian female referred by Darice Shivers, M.D., to characterize her current cognitive functioning and assist with diagnostic clarity and treatment planning in the context of subjective cognitive decline.   Feedback:   Ms. Regan completed a comprehensive neuropsychological evaluation on 01/18/2024. Please refer to that encounter for the full report and recommendations. Briefly, both Ms. Matzen and his sister expressed strong concerns surrounding Ms. Elwood being overmedicated. According to Ms. Gilson's current medication list within her electronic chart, she is prescribed several medications with well established cognitive side effects, as well as higher rates of sedation/somnolence and deficits in motor coordination. Most notable of these include alprazolam /Xanax , lamotrigine /Lamictal , quetiapine /Seroquel , doxepin /Sinequan , and aripiprazole /Abilify . I am not personally qualified to determine if Ms. Beller is indeed being overmedicated or not and this would need to come from her medical team, especially her psychiatrist. However, Ms. Juliana's presentation does resemble what can be seen in these instances, including a significant reduction in processing speed and sustained attention, as well as increased distractibility and her frequently losing her train of thought. As such, this remains a potential contributory factor for her clinical presentation and should be investigated more thoroughly. Across mood-related questionnaires, Ms. Danko reported acute symptoms of severe anxiety and severe depression, certainly to the extent where symptoms could impair attention/concentration, creating validity concerns across testing and very real deficits in her day-to-day life. Neurologically speaking, I am unable to rule out an underlying neurodegenerative  illness. However, test validity concerns limit a more targeted discussion, making it not possible to currently rule in any illness either.  Ms. Linden was accompanied by her sister during the current feedback session. Content of the current session focused on the results of her neuropsychological evaluation. Ms. Ezzell was given the opportunity to ask questions and her questions were answered. She was encouraged to reach out should additional questions arise. A copy of her report was provided at the conclusion of the visit.      One unit 96132 (40 minutes) was billed for Dr. Loralee time spent preparing for, conducting, and documenting the current feedback session with Ms. Boschee.

## 2024-02-08 ENCOUNTER — Telehealth (INDEPENDENT_AMBULATORY_CARE_PROVIDER_SITE_OTHER): Payer: Self-pay

## 2024-02-08 NOTE — Telephone Encounter (Signed)
 Received vm, called pt back. No answer, vm box full.

## 2024-02-09 MED ORDER — SUTAB 1479-225-188 MG PO TABS: ORAL_TABLET | ORAL | 0 refills | Status: DC

## 2024-02-09 NOTE — Telephone Encounter (Signed)
 Patient scheduled for 02/19/2024 at 9am, rx sent to pharmacy, instructions sent through mychart.

## 2024-02-09 NOTE — Telephone Encounter (Signed)
 Spoke to pt regarding pre-op telephone call date and about prep instructions. Advised pt that instructions for prep were  sent to her mychart. Pt verbalized understanding.

## 2024-02-09 NOTE — Addendum Note (Signed)
 Addended by: DALLIE LIONEL RAMAN on: 02/09/2024 11:39 AM   Modules accepted: Orders

## 2024-02-10 ENCOUNTER — Other Ambulatory Visit (INDEPENDENT_AMBULATORY_CARE_PROVIDER_SITE_OTHER)

## 2024-02-10 DIAGNOSIS — F419 Anxiety disorder, unspecified: Secondary | ICD-10-CM

## 2024-02-10 DIAGNOSIS — I10 Essential (primary) hypertension: Secondary | ICD-10-CM

## 2024-02-10 NOTE — Progress Notes (Signed)
 02/10/2024 Name: [name] MRN: [id number] DOB: 04-Dec-1970  Chief Complaint  Patient presents with   Medication Management    [name] is a 53 y.o. year old female who was referred for medication management by their primary care provider, Tobie Suzzane POUR, MD. They presented for a telephone visit today.   They were referred to the pharmacist by neuro MD for assistance in managing complex medication management    Subjective:  Patient reports recent falls due to dizziness upon standing.  She reports her Aunt committed her to the psych hospital a few months ago, but she did not stay.  There is concern from family members and notes in her chart that suggest patient may be oversedated on medications.  Her psychiatrist is prescribing and adjusting her psych medications.  We will defer this management to her psychiatrist.  We will plan to review all of her medications today to address any primary care related concerns.   Care Team: Primary Care Provider: Tobie Suzzane POUR, MD    Medication Access/Adherence  Current Pharmacy:  [facility] - [place], [place] - [address] [place] [address] Phone: [phone] Fax: [phone]   Patient reports affordability concerns with their medications: No  Patient reports access/transportation concerns to their pharmacy: No  Patient reports adherence concerns with their medications:  No     Medication Review: -Reviewed medications with patient -She reports she is taking them all and able to get them -She does report dizziness upon standing and multiple falls this summer -She has seen psych and neuro who will continue to manage these medications -She denies shortness of breath and chest pain--admits occasional shortness of breath on exertion -She notes that psych has tried to decrease her Seroquel  and Xanax  in the past, however Psych MD had to increase them back due to efficacy -Patient reports BP is controlled at  home (reports avg 130/90 HR 80-90s mostly  Objective:  Lab Results  Component Value Date   HGBA1C 5.3 12/24/2023    Lab Results  Component Value Date   CREATININE 1.38 (H) 12/24/2023   BUN 25 (H) 12/24/2023   NA 135 12/24/2023   K 5.2 12/24/2023   CL 101 12/24/2023   CO2 18 (L) 12/24/2023    Lab Results  Component Value Date   CHOL 158 12/24/2023   HDL 57 12/24/2023   LDLCALC 72 12/24/2023   TRIG 176 (H) 12/24/2023   CHOLHDL 2.8 12/24/2023    Medications Reviewed Today     Reviewed by Billee Mliss JONETTA, [facility] (Pharmacist) on 02/10/24 at 1110  Med List Status: <None>   Medication Order Taking? Sig Documenting Provider Last Dose Status Informant  ALPRAZolam  (XANAX ) 1 MG tablet [id number] Yes Take 1 mg by mouth 2 (two) times daily as needed for anxiety. 04/17/23 Patient reports xanax  changed from tid to bid [provider]  Active Family Member  amLODipine  (NORVASC ) 5 MG tablet [id number] Yes TAKE 1 TABLET BY MOUTH DAILY Tobie Suzzane POUR, MD  Active   ARIPiprazole  (ABILIFY ) 5 MG tablet [id number] Yes Take 5 mg by mouth at bedtime. [provider]  Active Family Member  atorvastatin  (LIPITOR) 20 MG tablet [id number] Yes TAKE 1 TABLET BY MOUTH EVERY DAY Patel, Rutwik K, MD  Active   benztropine (COGENTIN) 1 MG tablet [id number] Yes Take 1 mg by mouth 2 (two) times daily. [provider]  Active   carvedilol  (COREG ) 25 MG tablet [id number] Yes TAKE 1  TABLET BY MOUTH TWICE DAILY Patel, Rutwik K, MD  Active   Cholecalciferol  (VITAMIN D3) 125 MCG (5000 UT) TABS [id number] Yes Take 10,000 Units by mouth daily. [provider]  Active Family Member           Med Note DIONNE, [facility] R   Tue Sep 03, 2021  1:03 PM)    doxepin  (SINEQUAN ) 75 MG capsule [id number] Yes Take 225 mg by mouth at bedtime.  [provider]  Active Family Member  lamoTRIgine  (LAMICTAL ) 200 MG tablet [id number] Yes Take 200 mg by mouth 2 (two) times daily. [provider]  Active  Family Member  linaclotide  (LINZESS ) 72 MCG capsule [id number] Yes Take 1 capsule (72 mcg total) by mouth daily before breakfast. Carlan, Chelsea L, NP  Active   losartan  (COZAAR ) 25 MG tablet [id number] Yes Take 25 mg by mouth in the morning. [provider]  Active Family Member  mirabegron  ER (MYRBETRIQ ) 50 MG TB24 tablet [id number] Yes Take 1 tablet (50 mg total) by mouth daily. At bedtime Jayne Vonn DEL, MD  Active   nitrofurantoin  (MACRODANTIN ) 50 MG capsule [id number] Yes Take 50 mg by mouth 4 (four) times daily. [provider]  Active   potassium citrate  (UROCIT-K ) 10 MEQ (1080 MG) SR tablet [id number] Yes Take 20 mEq by mouth in the morning and at bedtime. [provider]  Active Family Member  QUEtiapine  (SEROQUEL  XR) 200 MG 24 hr tablet [id number] Yes Take 400 mg by mouth at bedtime. [provider]  Active   Sodium Sulfate-Mag Sulfate-KCl (SUTAB ) [phone] MG TABS [id number]  Take 12 tablets by mouth as directed. Eartha Flavors, Toribio, MD  Active   Sodium Sulfate-Mag Sulfate-KCl (SUTAB ) [phone] MG TABS [id number]  As directed Eartha Flavors Toribio, MD  Active              Assessment/Plan:  -reviewed medications thoroughly (purpose and side effects)--medication list is up to date -defer psychiatric medications to the psych provider; encouraged patient to reach out to psychiatrist if symptoms return or persist and it's not related to cardiac/BP  -highly encouraged patient to reach out to psych prior to [facility] scheduled appt if symptoms persist/adjustments to psych meds need to be made -Encouraged increased hydration with plain water  (at least 100 oz of water  daily based on weight) -patient has blood pressure cuff at home and will continue to check BP at home; goal <130/80   Follow Up Plan: PCP/psych/neuro as needed  Mliss Tarry Griffin, PharmD, BCACP, CPP Clinical Pharmacist, [facility]

## 2024-02-15 ENCOUNTER — Encounter (HOSPITAL_COMMUNITY)
Admission: RE | Admit: 2024-02-15 | Discharge: 2024-02-15 | Disposition: A | Discharge status: Home / Self Care | Source: Ambulatory Visit | Attending: Gastroenterology | Admitting: Gastroenterology

## 2024-02-17 ENCOUNTER — Encounter (HOSPITAL_COMMUNITY): Payer: Self-pay

## 2024-02-19 ENCOUNTER — Encounter (HOSPITAL_COMMUNITY)
Admission: RE | Disposition: A | Discharge status: Home / Self Care | Payer: Self-pay | Source: Home / Self Care | Attending: Gastroenterology

## 2024-02-19 ENCOUNTER — Ambulatory Visit (HOSPITAL_COMMUNITY)
Admission: RE | Admit: 2024-02-19 | Discharge: 2024-02-19 | Disposition: A | Discharge status: Home / Self Care | Attending: Gastroenterology | Admitting: Gastroenterology

## 2024-02-19 ENCOUNTER — Ambulatory Visit (HOSPITAL_COMMUNITY): Admitting: Anesthesiology

## 2024-02-19 ENCOUNTER — Ambulatory Visit (HOSPITAL_BASED_OUTPATIENT_CLINIC_OR_DEPARTMENT_OTHER): Admitting: Anesthesiology

## 2024-02-19 DIAGNOSIS — F17210 Nicotine dependence, cigarettes, uncomplicated: Secondary | ICD-10-CM | POA: Diagnosis not present

## 2024-02-19 DIAGNOSIS — N183 Chronic kidney disease, stage 3 unspecified: Secondary | ICD-10-CM | POA: Insufficient documentation

## 2024-02-19 DIAGNOSIS — K648 Other hemorrhoids: Secondary | ICD-10-CM | POA: Insufficient documentation

## 2024-02-19 DIAGNOSIS — I129 Hypertensive chronic kidney disease with stage 1 through stage 4 chronic kidney disease, or unspecified chronic kidney disease: Secondary | ICD-10-CM | POA: Diagnosis not present

## 2024-02-19 DIAGNOSIS — K649 Unspecified hemorrhoids: Secondary | ICD-10-CM | POA: Diagnosis not present

## 2024-02-19 DIAGNOSIS — K5730 Diverticulosis of large intestine without perforation or abscess without bleeding: Secondary | ICD-10-CM | POA: Insufficient documentation

## 2024-02-19 DIAGNOSIS — F319 Bipolar disorder, unspecified: Secondary | ICD-10-CM | POA: Insufficient documentation

## 2024-02-19 DIAGNOSIS — F419 Anxiety disorder, unspecified: Secondary | ICD-10-CM | POA: Diagnosis not present

## 2024-02-19 DIAGNOSIS — Z01818 Encounter for other preprocedural examination: Secondary | ICD-10-CM

## 2024-02-19 DIAGNOSIS — E6689 Other obesity not elsewhere classified: Secondary | ICD-10-CM | POA: Diagnosis not present

## 2024-02-19 DIAGNOSIS — Q439 Congenital malformation of intestine, unspecified: Secondary | ICD-10-CM

## 2024-02-19 DIAGNOSIS — Z6841 Body Mass Index (BMI) 40.0 and over, adult: Secondary | ICD-10-CM | POA: Insufficient documentation

## 2024-02-19 DIAGNOSIS — Z1211 Encounter for screening for malignant neoplasm of colon: Secondary | ICD-10-CM | POA: Diagnosis not present

## 2024-02-19 DIAGNOSIS — E039 Hypothyroidism, unspecified: Secondary | ICD-10-CM | POA: Diagnosis not present

## 2024-02-19 HISTORY — PX: COLONOSCOPY: SHX5424

## 2024-02-19 LAB — POCT PREGNANCY, URINE: Preg Test, Ur: NEGATIVE

## 2024-02-19 LAB — HM COLONOSCOPY

## 2024-02-19 SURGERY — COLONOSCOPY
Anesthesia: General

## 2024-02-19 MED ORDER — LINACLOTIDE 145 MCG PO CAPS: 145.0000 ug | ORAL_CAPSULE | Freq: Every day | ORAL | 1 refills | Status: AC

## 2024-02-19 MED ORDER — LACTATED RINGERS IV SOLN: INTRAVENOUS | Status: DC

## 2024-02-19 MED ORDER — PROPOFOL 500 MG/50ML IV EMUL
INTRAVENOUS | Status: DC | PRN
  Administered 2024-02-19: 150 ug/kg/min via INTRAVENOUS
  Administered 2024-02-19: 200 mg via INTRAVENOUS

## 2024-02-19 NOTE — Discharge Instructions (Signed)
 You are being discharged to home.  Resume your previous diet.  Your physician has recommended a repeat colonoscopy in 10 years for screening purposes.  Increase Linzess  145 mcg qday.

## 2024-02-19 NOTE — Anesthesia Preprocedure Evaluation (Signed)
 Anesthesia Evaluation  Patient identified by MRN, date of birth, ID band Patient awake    Reviewed: Allergy & Precautions, H&P , NPO status , Patient's Chart, lab work & pertinent test results, reviewed documented beta blocker date and time   Airway Mallampati: II  TM Distance: >3 FB Neck ROM: full    Dental no notable dental hx.    Pulmonary neg pulmonary ROS, Current Smoker   Pulmonary exam normal breath sounds clear to auscultation       Cardiovascular Exercise Tolerance: Good hypertension,  Rhythm:regular Rate:Normal     Neuro/Psych  PSYCHIATRIC DISORDERS Anxiety Depression Bipolar Disorder   negative neurological ROS     GI/Hepatic negative GI ROS, Neg liver ROS,,,  Endo/Other  Hypothyroidism  Class 4 obesity  Renal/GU Renal disease  negative genitourinary   Musculoskeletal   Abdominal   Peds  Hematology negative hematology ROS (+)   Anesthesia Other Findings   Reproductive/Obstetrics negative OB ROS                              Anesthesia Physical Anesthesia Plan  ASA: 3  Anesthesia Plan: General   Post-op Pain Management:    Induction:   PONV Risk Score and Plan: Propofol  infusion  Airway Management Planned:   Additional Equipment:   Intra-op Plan:   Post-operative Plan:   Informed Consent: I have reviewed the patients History and Physical, chart, labs and discussed the procedure including the risks, benefits and alternatives for the proposed anesthesia with the patient or authorized representative who has indicated his/her understanding and acceptance.     Dental Advisory Given  Plan Discussed with: CRNA  Anesthesia Plan Comments:         Anesthesia Quick Evaluation

## 2024-02-19 NOTE — H&P (Signed)
 [name] is an 53 y.o. female.   Chief Complaint: screening colonoscopy HPI: [name] is a 53 y.o. female with past medical history of anxiety, CKD, depression, hypothyroidism, obesity, who presents for screening colonoscopy.  Still has some constipation ffor 3-4 days followed by explosive BM. The patient denies having any nausea, vomiting, fever, chills, hematochezia, melena, hematemesis, abdominal distention, abdominal pain, diarrhea, jaundice, pruritus or weight loss.   Past Medical History:  Diagnosis Date   Acute cystitis without hematuria 08/01/2022   Anxiety    Bipolar disorder in partial remission 09/03/2021   Chronic pain of both knees 09/24/2023   Chronic pain of both shoulders 09/24/2023   CKD (chronic kidney disease) stage 3, GFR 30-59 ml/min 03/15/2019   Depression    Diarrhea 05/04/2017   Enlarged uterus 08/06/2020   Essential hypertension 03/15/2019   History of kidney stones    IBS (irritable bowel syndrome) 01/04/2024   LGSIL on Pap smear of cervix 08/17/2020   Needs colpo per [facility] immediate risk is 4.3 for CIN 3+risk    Mixed hyperlipidemia 09/05/2021   Morbid obesity    Recurrent falls 04/04/2022   Recurrent nephrolithiasis 09/03/2021   Subclinical hypothyroidism 03/15/2019   Tardive dyskinesia 11/21/2022   Vasovagal syncope 12/30/2021   Vitamin D  deficiency 03/15/2019    Past Surgical History:  Procedure Laterality Date   BIOPSY  06/05/2017   Procedure: BIOPSY;  Surgeon: Golda Claudis PENNER, MD;  Location: [facility];  Service: Endoscopy;;  colon   CHOLECYSTECTOMY     COLONOSCOPY N/A 06/05/2017   Procedure: COLONOSCOPY;  Surgeon: Golda Claudis PENNER, MD;  Location: [facility];  Service: Endoscopy;  Laterality: N/A;  8:30   CYSTOSCOPY WITH RETROGRADE PYELOGRAM, URETEROSCOPY AND STENT PLACEMENT Left 10/04/2020   Procedure: CYSTOSCOPY WITH LEFT RETROGRADE PYELOGRAM, DIAGNOSTIC LEFT URETEROSCOPY AND LEFT URETEREAL STENT PLACEMENT;  Surgeon: Sherrilee Belvie CROME, MD;  Location: [facility];  Service: Urology;  Laterality: Left;   CYSTOSCOPY WITH RETROGRADE PYELOGRAM, URETEROSCOPY AND STENT PLACEMENT Left 10/16/2020   Procedure: CYSTOSCOPY WITH LEFT RETROGRADE PYELOGRAM, LEFT URETEROSCOPY  WITH LASER AND LEFT URETERAL STENT EXCHANGE;  Surgeon: Sherrilee Belvie CROME, MD;  Location: [facility];  Service: Urology;  Laterality: Left;   CYSTOSCOPY WITH RETROGRADE PYELOGRAM, URETEROSCOPY AND STENT PLACEMENT Bilateral 02/17/2022   Procedure: CYSTOSCOPY WITH RETROGRADE PYELOGRAM, URETEROSCOPY AND STENT PLACEMENT;  Surgeon: Sherrilee Belvie CROME, MD;  Location: [facility];  Service: Urology;  Laterality: Bilateral;   CYSTOSCOPY WITH RETROGRADE PYELOGRAM, URETEROSCOPY AND STENT PLACEMENT Left 06/26/2022   Procedure: CYSTOSCOPY WITH RETROGRADE PYELOGRAM, URETEROSCOPY AND STENT PLACEMENT;  Surgeon: Sherrilee Belvie CROME, MD;  Location: [facility];  Service: Urology;  Laterality: Left;   HOLMIUM LASER APPLICATION Left 10/16/2020   Procedure: HOLMIUM LASER APPLICATION;  Surgeon: Sherrilee Belvie CROME, MD;  Location: [facility];  Service: Urology;  Laterality: Left;   HOLMIUM LASER APPLICATION Left 06/26/2022   Procedure: HOLMIUM LASER APPLICATION;  Surgeon: Sherrilee Belvie CROME, MD;  Location: [facility];  Service: Urology;  Laterality: Left;   IR  NEPHROURETERAL CATH PLACE LEFT  08/05/2022   KIDNEY STONE SURGERY     kidney stones     LASER ABLATION CONDOLAMATA N/A 10/31/2020   Procedure: LASER ABLATION OF THE CERVIX;  Surgeon: Jayne Vonn DEL, MD;  Location: [facility];  Service: Gynecology;  Laterality: N/A;   NEPHROLITHOTOMY Left 08/07/2022   Procedure: NEPHROLITHOTOMY PERCUTANEOUS;  Surgeon: Sherrilee Belvie CROME, MD;  Location: [facility];  Service: Urology;  Laterality: Left;  Family History  Problem Relation Age of Onset   Lung cancer Mother    Cancer Father    Heart failure Father    Cancer Maternal Grandmother    Cancer Paternal Grandmother    Heart failure Paternal Grandmother    Bipolar  disorder Maternal Aunt    Colon cancer Neg Hx    Social History:  reports that she has been smoking cigarettes. She started smoking about 22 years ago. She has a 11 pack-year smoking history. She has never used smokeless tobacco. She reports that she does not drink alcohol and does not use drugs.  Allergies:  Allergies  Allergen Reactions   Morphine And Codeine  Itching and Hives   Sulfa Antibiotics Swelling   Tape Itching and Other (See Comments)    Surgical     Medications Prior to Admission  Medication Sig Dispense Refill   ALPRAZolam  (XANAX ) 1 MG tablet Take 1 mg by mouth 2 (two) times daily as needed for anxiety. 04/17/23 Patient reports xanax  changed from tid to bid     amLODipine  (NORVASC ) 5 MG tablet TAKE 1 TABLET BY MOUTH DAILY 30 tablet 5   ARIPiprazole  (ABILIFY ) 5 MG tablet Take 5 mg by mouth at bedtime.     atorvastatin  (LIPITOR) 20 MG tablet TAKE 1 TABLET BY MOUTH EVERY DAY 90 tablet 3   benztropine (COGENTIN) 1 MG tablet Take 1 mg by mouth 2 (two) times daily.     carvedilol  (COREG ) 25 MG tablet TAKE 1 TABLET BY MOUTH TWICE DAILY 180 tablet 2   Cholecalciferol  (VITAMIN D3) 125 MCG (5000 UT) TABS Take 10,000 Units by mouth daily.     doxepin  (SINEQUAN ) 75 MG capsule Take 225 mg by mouth at bedtime.      lamoTRIgine  (LAMICTAL ) 200 MG tablet Take 200 mg by mouth 2 (two) times daily.     linaclotide  (LINZESS ) 72 MCG capsule Take 1 capsule (72 mcg total) by mouth daily before breakfast. 90 capsule 3   losartan  (COZAAR ) 25 MG tablet Take 25 mg by mouth in the morning.     mirabegron  ER (MYRBETRIQ ) 50 MG TB24 tablet Take 1 tablet (50 mg total) by mouth daily. At bedtime 30 tablet 11   nitrofurantoin  (MACRODANTIN ) 50 MG capsule Take 50 mg by mouth 4 (four) times daily.     potassium citrate  (UROCIT-K ) 10 MEQ (1080 MG) SR tablet Take 20 mEq by mouth in the morning and at bedtime.     QUEtiapine  (SEROQUEL  XR) 200 MG 24 hr tablet Take 400 mg by mouth at bedtime.     Sodium  Sulfate-Mag Sulfate-KCl (SUTAB ) [phone] MG TABS Take 12 tablets by mouth as directed. 24 tablet 0   Sodium Sulfate-Mag Sulfate-KCl (SUTAB ) [phone] MG TABS As directed 24 tablet 0    No results found for this or any previous visit (from the past 48 hours). No results found.  Review of Systems  All other systems reviewed and are negative.   Pulse 100, temperature 98.4 F (36.9 C), temperature source Oral, resp. rate 20, height 5' 3 (1.6 m), weight 104.3 kg, SpO2 96%. Physical Exam  GENERAL: The patient is AO x3, in no acute distress. HEENT: Head is normocephalic and atraumatic. EOMI are intact. Mouth is well hydrated and without lesions. NECK: Supple. No masses LUNGS: Clear to auscultation. No presence of rhonchi/wheezing/rales. Adequate chest expansion HEART: RRR, normal s1 and s2. ABDOMEN: Soft, nontender, no guarding, no peritoneal signs, and nondistended. BS +. No masses. EXTREMITIES: Without any cyanosis, clubbing, rash,  lesions or edema. NEUROLOGIC: AOx3, no focal motor deficit. SKIN: no jaundice, no rashes  Assessment/Plan Hanley D Anwar is a 53 y.o. female with past medical history of anxiety, CKD, depression, hypothyroidism, obesity, who presents for screening colonoscopy. Will proceed with colonoscopy  Toribio Eartha Flavors, MD 02/19/2024, 7:35 AM

## 2024-02-19 NOTE — Anesthesia Procedure Notes (Signed)
 Date/Time: 02/19/2024 8:15 AM  Performed by: Barbarann Verneita RAMAN, CRNAPre-anesthesia Checklist: Patient identified, Emergency Drugs available, Suction available, Timeout performed and Patient being monitored Patient Re-evaluated:Patient Re-evaluated prior to induction Oxygen Delivery Method: Nasal Cannula

## 2024-02-19 NOTE — Op Note (Signed)
 [facility] Patient Name: [name] Procedure Date: 02/19/2024 7:59 AM MRN: [id number] Date of Birth: [birth year] Attending MD: Toribio Fortune , , [id number] CSN: [id number] Age: 53 Admit Type: Outpatient Procedure:                Colonoscopy Indications:              Screening for colorectal malignant neoplasm Providers:                Toribio Fortune, Harlene Lips, Gordy Lonni Balm, Technician Referring MD:              Medicines:                Monitored Anesthesia Care Complications:            No immediate complications. Estimated Blood Loss:     Estimated blood loss: none. Procedure:                Pre-Anesthesia Assessment:                           - Prior to the procedure, a History and Physical                            was performed, and patient medications, allergies                            and sensitivities were reviewed. The patient's                            tolerance of previous anesthesia was reviewed.                           - The risks and benefits of the procedure and the                            sedation options and risks were discussed with the                            patient. All questions were answered and informed                            consent was obtained.                           - ASA Grade Assessment: II - A patient with mild                            systemic disease.                           After obtaining informed consent, the colonoscope                            was passed under direct vision. Throughout the  procedure, the patient's blood pressure, pulse, and                            oxygen saturations were monitored continuously. The                            [id number] ([id number]) was introduced through the                            anus and advanced to the the cecum, identified by                            appendiceal orifice and ileocecal valve. The                             colonoscopy was performed without difficulty. The                            patient tolerated the procedure well. The quality                            of the bowel preparation was excellent. Scope In: 8:22:09 AM Scope Out: 8:41:59 AM Scope Withdrawal Time: 0 hours 10 minutes 21 seconds  Total Procedure Duration: 0 hours 19 minutes 50 seconds  Findings:      The perianal and digital rectal examinations were normal.      The colon (entire examined portion) was moderately tortuous.      A few small-mouthed diverticula were found in the sigmoid colon.      Non-bleeding internal hemorrhoids were found during retroflexion. The       hemorrhoids were small. Impression:               - Tortuous colon.                           - Diverticulosis in the sigmoid colon.                           - Non-bleeding internal hemorrhoids.                           - No specimens collected. Moderate Sedation:      Per Anesthesia Care Recommendation:           - Discharge patient to home (ambulatory).                           - Resume previous diet.                           - Repeat colonoscopy in 10 years for screening                            purposes.                           - Increase Linzess  145 mcg qday. Procedure Code(s):        ---  Professional ---                           H9878, Colorectal cancer screening; colonoscopy on                            individual not meeting criteria for high risk Diagnosis Code(s):        --- Professional ---                           Z12.11, Encounter for screening for malignant                            neoplasm of colon                           K64.8, Other hemorrhoids                           K57.30, Diverticulosis of large intestine without                            perforation or abscess without bleeding                           Q43.8, Other specified congenital malformations of                             intestine CPT copyright 2022 [employer]. All rights reserved. The codes documented in this report are preliminary and upon coder review may  be revised to meet current compliance requirements. Toribio Fortune, MD Toribio Fortune,  02/19/2024 8:52:08 AM This report has been signed electronically. Number of Addenda: 0

## 2024-02-19 NOTE — Transfer of Care (Signed)
 Immediate Anesthesia Transfer of Care Note  Patient: Rhylei D Cooks  Procedure(s) Performed: COLONOSCOPY  Patient Location: Short Stay  Anesthesia Type:General  Level of Consciousness: drowsy  Airway & Oxygen Therapy: Patient Spontanous Breathing  Post-op Assessment: Report given to RN and Post -op Vital signs reviewed and stable  Post vital signs: Reviewed and stable  Last Vitals:  Vitals Value Taken Time  BP 87/62 02/19/24 08:47  Temp 36.6 C 02/19/24 08:47  Pulse 93 02/19/24 08:47  Resp 16 02/19/24 08:47  SpO2 97 % 02/19/24 08:47    Last Pain:  Vitals:   02/19/24 0847  TempSrc: Oral  PainSc: 0-No pain      Patients Stated Pain Goal: 7 (02/19/24 0728)  Complications: No notable events documented.

## 2024-02-22 ENCOUNTER — Other Ambulatory Visit: Payer: Self-pay | Admitting: Internal Medicine

## 2024-02-22 ENCOUNTER — Encounter (HOSPITAL_COMMUNITY): Payer: Self-pay | Admitting: Gastroenterology

## 2024-02-23 ENCOUNTER — Encounter (INDEPENDENT_AMBULATORY_CARE_PROVIDER_SITE_OTHER): Payer: Self-pay | Admitting: *Deleted

## 2024-02-26 NOTE — Anesthesia Postprocedure Evaluation (Signed)
 Anesthesia Post Note  Patient: [name]  Procedure(s) Performed: COLONOSCOPY  Patient location during evaluation: Phase II Anesthesia Type: General Level of consciousness: awake Pain management: pain level controlled Vital Signs Assessment: post-procedure vital signs reviewed and stable Respiratory status: spontaneous breathing and respiratory function stable Cardiovascular status: blood pressure returned to baseline and stable Postop Assessment: no headache and no apparent nausea or vomiting Anesthetic complications: no Comments: Late entry   No notable events documented.   Last Vitals:  Vitals:   02/19/24 0847 02/19/24 0905  BP: (!) 87/62 98/66  Pulse: 93   Resp: 16   Temp: 36.6 C   SpO2: 97%     Last Pain:  Vitals:   02/19/24 0847  TempSrc: Oral  PainSc: 0-No pain                 Yvonna JINNY Bosworth

## 2024-02-29 ENCOUNTER — Encounter: Payer: Self-pay | Admitting: Neurology

## 2024-02-29 ENCOUNTER — Ambulatory Visit: Admitting: Neurology

## 2024-02-29 VITALS — BP 150/82 | HR 85 | Ht 63.0 in | Wt 230.0 lb

## 2024-02-29 DIAGNOSIS — G3184 Mild cognitive impairment, so stated: Secondary | ICD-10-CM | POA: Diagnosis not present

## 2024-02-29 DIAGNOSIS — R4020 Unspecified coma: Secondary | ICD-10-CM

## 2024-02-29 DIAGNOSIS — R296 Repeated falls: Secondary | ICD-10-CM | POA: Diagnosis not present

## 2024-02-29 NOTE — Patient Instructions (Addendum)
 Good to see you.  An order will be sent for a 30-day cardiac monitor to [facility] cardiology  2. We will send another order for Balance therapy and have them contact your father to set up appointment  3. Please bring the copy of your memory evaluation for your psychiatrist to review to hopefully streamline medications if possible  4. Follow-up in 4-5 months, call for any changes

## 2024-02-29 NOTE — Progress Notes (Unsigned)
 NEUROLOGY FOLLOW UP OFFICE NOTE  [name] [id number] 1971/01/25  HISTORY OF PRESENT ILLNESS: I had the pleasure of seeing [name] in follow-up in the neurology clinic on 02/29/2024.  The patient was last seen 3 months ago for frequent falls and memory loss. She is again  accompanied by her best friend [name] who helps supplement the history today.  Records and images were personally reviewed where available. Neurocognitive testing in 01/2024 noted that performance across validity indicators were variable, there is a strong changec ***.  01/2024: Performance across stand-alone and embedded performance validity indicators were variable but often below expectation. As such, the results of the current evaluation should not be taken at face value as there is a strong chance that obtained scores underestimate [name]'s true cognitive abilities. To be clear, I do not feel that [name] was attempting to perform poorly or purposefully disengaging herself from the testing process. She was an active participant, appeared engaged, persisted reasonably well, and completed all tasks asked of her. As such, I do not have suspicion for a malingering presentation. Rather, I believe that [name] was highly distractible and ultimately unable to appropriately focus on test materials as they were presented to her, ultimately artificially suppressing obtained test scores.   Due to validity concerns across testing, I am limited in offering diagnostic clarity regarding the cause for ongoing dysfunction. When speaking more broadly, there are several factors currently in play which can create significant cognitive dysfunction which should be actively considered by her medical team in my opinion. Across mood-related questionnaires, [name] reported acute symptoms of severe anxiety and severe depression, certainly to the extent where symptoms could impair attention/concentration, creating validity concerns across  testing and very real deficits in her day-to-day life.   Additionally, another primary consideration should be concerns regarding polypharmacy. Both [name] and his sister expressed strong concerns surrounding [name] being overmedicated. According to [name]'s current medication list within her electronic chart, she is prescribed several medications with well established cognitive side effects, as well as higher rates of sedation/somnolence and deficits in motor coordination. Most notable of these include alprazolam /Xanax , lamotrigine /Lamictal , quetiapine /Seroquel , doxepin /Sinequan , and aripiprazole /Abilify . I am not personally qualified to determine if [name] is indeed being overmedicated or not and this would need to come from her medical team, especially her psychiatrist. However, [name]'s presentation does resemble what can be seen in these instances, including a significant reduction in processing speed and sustained attention, as well as increased distractibility and her frequently losing her train of thought. As such, this remains a potential contributory factor for her clinical presentation and should be investigated more thoroughly.  Mood depends Stopped falling, since put on linzess  Fell 3 times in 2 weeks, eihter not paying attention; one time was on recliner and could not get out and slid herself out; July or Aug, fell in doorway and does not remember how, they found her and she was out, had to pull her into the house and called her dad Couple of months ago, aunt wants to have her committed because thinking it is to hurt herself; admitted overnight at [facility] Either turning too fast, but not losing consciousness  Not a whole lot  Asks where she is or where going Driving, got all messed up and missed her exit, then they found it What was I saying     I had the pleasure of seeing [name] in follow-up in the neurology clinic on 11/26/2023.  She is again accompanied by her  best friend [name] who helps supplement the history today. The patient was last seen on 5 months ago. She initially presented for frequent falls which initially appeared to improve after adjustment in psychiatric medications, however today she reports she has fallen a lot since her last visit. She is just clumsy, last fall was a few days ago as she was coming in the front door with her hands full and tripped on the small step. She bruised herself. She denies any focal numbness/tingling/weakness. She has neck, back, and left shoulder pain. No incontinence, she has occasional constipation. She denies any loss of consciousness with the falls. [name] talks to her every 2 hours on the phone and denies any episodes of unresponsiveness.    Main concern on last visit were memory changes, we had discussed Neurocognitive testing however due to staffing issues, this is not scheduled until 04/2024. Both of them report she would be talking and forget what she is talking about. Everyone has to finish her sentences. She denies getting lost driving but gets disoriented sometimes. She denies missing medications, [name] reports she asks her about it and sometimes takes the AM for PM but this is rare. She denies missing bill payments. She does not cook. She continues to see Olam Overlie with Psychiatry, tardive dyskinesia has resolved off Ingrezza  and with addition of Cogentin. She is on Xanax , Abilify , and Quetiapine .    History on Initial Assessment 05/14/2022: This is a 53 year old right-handed man with a history of hypertension, hypothyroidism, nephrolithiasis, CKD, anxiety, depression, presenting for frequent falls. She states she does not remember what occurs before or after a fall. Symptoms started 6-8 months ago, last fall was 2 weeks ago. She lives with her father. Her father mentioned she got up one night to use the bathroom and fell, she was awake but not alert. She does not recall going to the bathroom or him helping  her up. Another time 3-4 months ago, she was out of it and slurring her speech. She had fallen several times, getting up to use the bathroom and fall, awake but not really awake, occurring in the daytime as well. She was initially falling 4 times in a week so [name] stayed with her for 2 weeks, then all of a sudden, she was back to her old self. Symptoms then recurrent for 1-2 weeks where [name] and their aunt stayed to care for her. Speech was slurred so bad, she could not understand people around her but [name] reports she would follow instructions. One time she had urinary incontinence. [name] notes she gets really irritable during those periods then would not remember what she said. She reports her memory is bad in general. Over the past year, she denies getting lost driving but would get disoriented for a minute then realize where she is. She denies missing medications or bill payments but forgets to make appointments. She reports it takes a lot of effort to do what she needs to, such as paperwork. Her handwriting has also changed, it starts good then goes off the line.   They deny any staring/unresponsive episodes. She denies any olfactory/gustatory hallucinations. She has numbness and burning on the left lateral thigh. It hurts to lift her left arm, no numbness/tingling. Her left hip also bothers her. She states the pain is not from prior falls. Her feet feel cold but they are warm when touched. She has brief body twitches and developed tics which Ingrezza  sometimes helps  with. She denies any headaches, neck/back pain, dysarthria/dysphagia. She has a lot of dizziness. She was having a little double vision during the months she had the episodes. She has some constipation but also has times she cannot hold her BM and has incontinence. [name] would help her get cleaned up but she would not recall how she got cleaned. She usually gets 5 hours of sleep and denies any change in sleep pattern or alcohol intake during  the episodes. Mood is okay, she gets anxious and cold. She takes Xanax  every night with no missed doses. She is also on Lamotrigine  200mg  BID for mood. No family history of similar symptoms. She had a normal birth and early development.  There is no history of febrile convulsions, CNS infections such as meningitis/encephalitis, significant traumatic brain injury, neurosurgical procedures, or family history of seizures.  Diagnostic Data: Brain MRI with and without contrast in 06/2022 was motion degraded but there were no abnormalities seen.  MRI cervical spine with and without contrast did not show any myelopathy. There was mild to moderate disc degeneration at C4-5 and C5-6, disc bulge mildly effaces the ventral thecal sac and contacts ventral aspect of spinal cord.   1-hour EEG in 05/2022 was normal Ambulatory 26-hour EEG in 06/2022 normal, typical events not captured.   PAST MEDICAL HISTORY: Past Medical History:  Diagnosis Date   Acute cystitis without hematuria 08/01/2022   Anxiety    Bipolar disorder in partial remission 09/03/2021   Chronic pain of both knees 09/24/2023   Chronic pain of both shoulders 09/24/2023   CKD (chronic kidney disease) stage 3, GFR 30-59 ml/min 03/15/2019   Depression    Diarrhea 05/04/2017   Enlarged uterus 08/06/2020   Essential hypertension 03/15/2019   History of kidney stones    IBS (irritable bowel syndrome) 01/04/2024   LGSIL on Pap smear of cervix 08/17/2020   Needs colpo per [facility] immediate risk is 4.3 for CIN 3+risk    Mixed hyperlipidemia 09/05/2021   Morbid obesity    Recurrent falls 04/04/2022   Recurrent nephrolithiasis 09/03/2021   Subclinical hypothyroidism 03/15/2019   Tardive dyskinesia 11/21/2022   Vasovagal syncope 12/30/2021   Vitamin D  deficiency 03/15/2019    MEDICATIONS: Current Outpatient Medications on File Prior to Visit  Medication Sig Dispense Refill   ALPRAZolam  (XANAX ) 1 MG tablet Take 1 mg by mouth 2 (two) times daily  as needed for anxiety. 04/17/23 Patient reports xanax  changed from tid to bid     amLODipine  (NORVASC ) 5 MG tablet TAKE 1 TABLET BY MOUTH DAILY 30 tablet 5   ARIPiprazole  (ABILIFY ) 5 MG tablet Take 5 mg by mouth at bedtime.     atorvastatin  (LIPITOR) 20 MG tablet TAKE 1 TABLET BY MOUTH EVERY DAY 90 tablet 3   benztropine (COGENTIN) 1 MG tablet Take 1 mg by mouth 2 (two) times daily.     carvedilol  (COREG ) 25 MG tablet TAKE 1 TABLET BY MOUTH TWICE DAILY 180 tablet 2   Cholecalciferol  (VITAMIN D3) 125 MCG (5000 UT) TABS Take 10,000 Units by mouth daily.     doxepin  (SINEQUAN ) 75 MG capsule Take 225 mg by mouth at bedtime.      lamoTRIgine  (LAMICTAL ) 200 MG tablet Take 200 mg by mouth 2 (two) times daily.     linaclotide  (LINZESS ) 145 MCG CAPS capsule Take 1 capsule (145 mcg total) by mouth daily before breakfast. 90 capsule 1   losartan  (COZAAR ) 25 MG tablet Take 25 mg by mouth in the morning.  mirabegron  ER (MYRBETRIQ ) 50 MG TB24 tablet Take 1 tablet (50 mg total) by mouth daily. At bedtime 30 tablet 11   nitrofurantoin  (MACRODANTIN ) 50 MG capsule Take 50 mg by mouth 4 (four) times daily.     potassium citrate  (UROCIT-K ) 10 MEQ (1080 MG) SR tablet Take 20 mEq by mouth in the morning and at bedtime.     QUEtiapine  (SEROQUEL  XR) 200 MG 24 hr tablet Take 400 mg by mouth at bedtime.     No current facility-administered medications on file prior to visit.    ALLERGIES: Allergies  Allergen Reactions   Morphine And Codeine  Itching and Hives   Sulfa Antibiotics Swelling   Tape Itching and Other (See Comments)    Surgical     FAMILY HISTORY: Family History  Problem Relation Age of Onset   Lung cancer Mother    Cancer Father    Heart failure Father    Cancer Maternal Grandmother    Cancer Paternal Grandmother    Heart failure Paternal Grandmother    Bipolar disorder Maternal Aunt    Colon cancer Neg Hx     SOCIAL HISTORY: Social History   Socioeconomic History   Marital status:  Divorced    Spouse name: Not on file   Number of children: Not on file   Years of education: 14   Highest education level: Some college, no degree  Occupational History   Occupation: Disability    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[employer], forensic  Tobacco Use   Smoking status: Every Day    Current packs/day: 0.50    Average packs/day: 0.5 packs/day for 22.1 years (11.0 ttl pk-yrs)    Types: Cigarettes    Start date: 02/08/2002   Smokeless tobacco: Never  Vaping Use   Vaping status: Never Used  Substance and Sexual Activity   Alcohol use: No   Drug use: No   Sexual activity: Not Currently    Birth control/protection: Post-menopausal  Other Topics Concern   Not on file  Social History Narrative   Divorced twice,1st lasted 5 years,2nd 4 years.On disabilty secondary to mental illness since 2004.Previously used to [employer].Lives alone,has 2 cats.cright handed    Are you right handed or left handed? right   Are you currently employed ? no   What is your current occupation?   Do you live at home alone?no   Who lives with you? dad   What type of home do you live in: 1 story or 2 story? one    Caffeine 2 cans of soda   Social Drivers of Health   Financial Resource Strain: Medium Risk (12/21/2023)   Overall Financial Resource Strain (CARDIA)    Difficulty of Paying Living Expenses: Somewhat hard  Food Insecurity: No Food Insecurity (12/21/2023)   Hunger Vital Sign    Worried About Running Out of Food in the Last Year: Never true    Ran Out of Food in the Last Year: Never true  Transportation Needs: No Transportation Needs (12/21/2023)   PRAPARE - Administrator, Civil Service (Medical): No    Lack of Transportation (Non-Medical): No  Physical Activity: Inactive (12/21/2023)   Exercise Vital Sign    Days of Exercise per Week: 0 days    Minutes of Exercise per Session: 0 min  Stress: Stress Concern Present (12/21/2023)   [employer] of  Occupational Health - Occupational Stress Questionnaire    Feeling of Stress: Very much  Social Connections: Socially Isolated (12/21/2023)  Social Advertising account executive    Frequency of Communication with Friends and Family: More than three times a week    Frequency of Social Gatherings with Friends and Family: Once a week    Attends Religious Services: Never    Database administrator or Organizations: No    Attends Banker Meetings: Never    Marital Status: Divorced  Catering manager Violence: Not At Risk (12/21/2023)   Humiliation, Afraid, Rape, and Kick questionnaire    Fear of Current or Ex-Partner: No    Emotionally Abused: No    Physically Abused: No    Sexually Abused: No     PHYSICAL EXAM: Vitals:   02/29/24 1424  BP: (!) 150/82  Pulse: 85  SpO2: 98%   General: No acute distress Head:  Normocephalic/atraumatic Skin/Extremities: No rash, no edema Neurological Exam: alert and oriented to person, place, and time. No aphasia or dysarthria. Fund of knowledge is appropriate.  Recent and remote memory are intact.  Attention and concentration are normal.   Cranial nerves: Pupils equal, round. Extraocular movements intact with no nystagmus. Visual fields full.  No facial asymmetry.  Motor: Bulk and tone normal, muscle strength 5/5 throughout with no pronator drift.   Finger to nose testing intact.  Gait narrow-based and steady, able to tandem walk adequately.  Romberg negative.   IMPRESSION: This is a 53 yo RH woman with a history of hypertension, hypothyroidism, nephrolithiasis, CKD, anxiety, depression, who presented with frequent falls where she has no recollection of events. MRI brain unremarkable, MRI cervical spine no myelopathy seen. Her routine and 26-hour EEG were normal. These had improved since psychiatric medications were reduced, however she continues to report frequent falls likely due to musculoskeletal issues (arthritis) or inattention. She will be  referred for Balance therapy. They continue to report cognitive changes, proceed with Neuropsychological testing as previously discussed, we will try to move up her appointment. Follow-up after testing, call for any changes.    Thank you for allowing me to participate in *** care.  Please do not hesitate to call for any questions or concerns.  The duration of this appointment visit was *** minutes of face-to-face time with the patient.  Greater than 50% of this time was spent in counseling, explanation of diagnosis, planning of further management, and coordination of care.   Darice Shivers, M.D.   CC: ***

## 2024-03-13 ENCOUNTER — Other Ambulatory Visit: Payer: Self-pay | Admitting: Internal Medicine

## 2024-03-13 DIAGNOSIS — E782 Mixed hyperlipidemia: Secondary | ICD-10-CM

## 2024-03-16 ENCOUNTER — Encounter (INDEPENDENT_AMBULATORY_CARE_PROVIDER_SITE_OTHER): Payer: Self-pay | Admitting: Gastroenterology

## 2024-03-16 DIAGNOSIS — N3001 Acute cystitis with hematuria: Secondary | ICD-10-CM | POA: Diagnosis not present

## 2024-03-16 DIAGNOSIS — R10A2 Flank pain, left side: Secondary | ICD-10-CM | POA: Diagnosis not present

## 2024-03-16 DIAGNOSIS — N200 Calculus of kidney: Secondary | ICD-10-CM | POA: Diagnosis not present

## 2024-03-16 DIAGNOSIS — I129 Hypertensive chronic kidney disease with stage 1 through stage 4 chronic kidney disease, or unspecified chronic kidney disease: Secondary | ICD-10-CM | POA: Diagnosis not present

## 2024-03-16 DIAGNOSIS — F17210 Nicotine dependence, cigarettes, uncomplicated: Secondary | ICD-10-CM | POA: Diagnosis not present

## 2024-03-16 DIAGNOSIS — N183 Chronic kidney disease, stage 3 unspecified: Secondary | ICD-10-CM | POA: Diagnosis not present

## 2024-03-16 DIAGNOSIS — Z885 Allergy status to narcotic agent status: Secondary | ICD-10-CM | POA: Diagnosis not present

## 2024-03-16 DIAGNOSIS — Z882 Allergy status to sulfonamides status: Secondary | ICD-10-CM | POA: Diagnosis not present

## 2024-03-21 ENCOUNTER — Other Ambulatory Visit: Payer: Self-pay | Admitting: Internal Medicine

## 2024-03-22 ENCOUNTER — Encounter (INDEPENDENT_AMBULATORY_CARE_PROVIDER_SITE_OTHER): Payer: Self-pay | Admitting: Gastroenterology

## 2024-03-22 ENCOUNTER — Ambulatory Visit: Admitting: Obstetrics & Gynecology

## 2024-04-07 ENCOUNTER — Ambulatory Visit

## 2024-04-12 DIAGNOSIS — N189 Chronic kidney disease, unspecified: Secondary | ICD-10-CM | POA: Diagnosis not present

## 2024-04-12 DIAGNOSIS — D631 Anemia in chronic kidney disease: Secondary | ICD-10-CM | POA: Diagnosis not present

## 2024-04-12 DIAGNOSIS — N1832 Chronic kidney disease, stage 3b: Secondary | ICD-10-CM | POA: Diagnosis not present

## 2024-04-12 DIAGNOSIS — R809 Proteinuria, unspecified: Secondary | ICD-10-CM | POA: Diagnosis not present

## 2024-04-13 ENCOUNTER — Encounter: Payer: Self-pay | Admitting: Neurology

## 2024-04-21 ENCOUNTER — Institutional Professional Consult (permissible substitution): Payer: Medicare HMO | Admitting: Psychology

## 2024-04-21 ENCOUNTER — Ambulatory Visit: Payer: Self-pay

## 2024-04-25 ENCOUNTER — Encounter: Payer: Self-pay | Admitting: Internal Medicine

## 2024-04-25 ENCOUNTER — Ambulatory Visit (INDEPENDENT_AMBULATORY_CARE_PROVIDER_SITE_OTHER): Admitting: Internal Medicine

## 2024-04-25 VITALS — BP 130/65 | HR 88 | Ht 63.0 in | Wt 222.2 lb

## 2024-04-25 DIAGNOSIS — G2401 Drug induced subacute dyskinesia: Secondary | ICD-10-CM

## 2024-04-25 DIAGNOSIS — K582 Mixed irritable bowel syndrome: Secondary | ICD-10-CM

## 2024-04-25 DIAGNOSIS — Z23 Encounter for immunization: Secondary | ICD-10-CM | POA: Diagnosis not present

## 2024-04-25 DIAGNOSIS — F3170 Bipolar disorder, currently in remission, most recent episode unspecified: Secondary | ICD-10-CM

## 2024-04-25 DIAGNOSIS — I10 Essential (primary) hypertension: Secondary | ICD-10-CM | POA: Diagnosis not present

## 2024-04-25 DIAGNOSIS — N1832 Chronic kidney disease, stage 3b: Secondary | ICD-10-CM | POA: Diagnosis not present

## 2024-04-25 NOTE — Progress Notes (Unsigned)
 Established Patient Office Visit  Subjective:  Patient ID: [name], female    DOB: 11/16/1970  Age: 53 y.o. MRN: [id number]  CC:  Chief Complaint  Patient presents with   Medical Management of Chronic Issues    4 month f/u , has noticed some dizziness preventing her from driving, ongoing for 1 month.    HPI [name] is a 53 y.o. female with past medical history of HTN, CKD stage III, bipolar disorder and nephrolithiasis who presents for f/u of her chronic medical conditions.  HTN: BP was well controlled today.  She takes amlodipine , losartan , and carvedilol  currently.  She still reports mild dizziness, especially upon standing up.  Denies any recent episode of fall.  Of note, she admits that she needs to increase her water  intake.  She had seen improvement in dizziness since stopping chlorthalidone  and clonidine .  Denies any headache, chest pain or palpitations.  Tardive dyskinesia: She had constant twitching of the mouth and the lower jaw.  Of note, she is currently on Abilify  5 mg nightly and Seroquel  400 mg nightly.  She is on benztropine instead of Ingrezza  now for extrapyramidal side effects of antipsychotics, which has improved her symptoms.  She has seen neurology for it.  She also has memory concerns, which are likely due to her psychiatric medicines.  Recurrent falls: She has h/o recurrent falls, but frequency has decreased now.  Does not report any syncopal episode since the last visit.  She has seen neurologist, and has had MRI of the brain and cervical spine, which were unremarkable to suggest any specific etiology for falls. Denies any episode of shaking.  She also had EEG, which was also unremarkable.  She reports chronic leg weakness.  CKD:  Followed by Nephrology.  Denies any dysuria, hematuria or urinary hesitancy or resistance.  Recurrent nephrolithiasis: She had nephrolithotomy in 03/24. She currently denies any dysuria, hematuria, flank pain, fever, chills or  nausea/vomiting.  Morbid obesity: She is struggling to lose weight despite trying phentermine .  She agrees to follow low-carb diet.  She admits that she eats in frequent intervals and does not like healthier food such as vegetables and fruits, but is willing to make adjustments.  Her exercise capacity is limited due to chronic knee pain.  Past Medical History:  Diagnosis Date   Acute cystitis without hematuria 08/01/2022   Anxiety    Bipolar disorder in partial remission 09/03/2021   Chronic pain of both knees 09/24/2023   Chronic pain of both shoulders 09/24/2023   CKD (chronic kidney disease) stage 3, GFR 30-59 ml/min 03/15/2019   Depression    Diarrhea 05/04/2017   Enlarged uterus 08/06/2020   Essential hypertension 03/15/2019   History of kidney stones    IBS (irritable bowel syndrome) 01/04/2024   LGSIL on Pap smear of cervix 08/17/2020   Needs colpo per [facility] immediate risk is 4.3 for CIN 3+risk    Mixed hyperlipidemia 09/05/2021   Morbid obesity    Recurrent falls 04/04/2022   Recurrent nephrolithiasis 09/03/2021   Subclinical hypothyroidism 03/15/2019   Tardive dyskinesia 11/21/2022   Vasovagal syncope 12/30/2021   Vitamin D  deficiency 03/15/2019    Past Surgical History:  Procedure Laterality Date   BIOPSY  06/05/2017   Procedure: BIOPSY;  Surgeon: Golda Claudis PENNER, MD;  [facility];  Service: Endoscopy;;  colon   CHOLECYSTECTOMY     COLONOSCOPY N/A 06/05/2017   Procedure: COLONOSCOPY;  Surgeon: Golda Claudis PENNER, MD;  [facility];  Service: Endoscopy;  Laterality: N/A;  8:30   COLONOSCOPY N/A 02/19/2024   Procedure: COLONOSCOPY;  Surgeon: Eartha Angelia Sieving, MD;  [facility];  Service: Gastroenterology;  Laterality: N/A;  9:00am, ASA 1-2   CYSTOSCOPY WITH RETROGRADE PYELOGRAM, URETEROSCOPY AND STENT PLACEMENT Left 10/04/2020   Procedure: CYSTOSCOPY WITH LEFT RETROGRADE PYELOGRAM, DIAGNOSTIC LEFT URETEROSCOPY AND LEFT URETEREAL STENT  PLACEMENT;  Surgeon: Sherrilee Belvie CROME, MD;  Location: [facility];  Service: Urology;  Laterality: Left;   CYSTOSCOPY WITH RETROGRADE PYELOGRAM, URETEROSCOPY AND STENT PLACEMENT Left 10/16/2020   Procedure: CYSTOSCOPY WITH LEFT RETROGRADE PYELOGRAM, LEFT URETEROSCOPY  WITH LASER AND LEFT URETERAL STENT EXCHANGE;  Surgeon: Sherrilee Belvie CROME, MD;  Location: [facility];  Service: Urology;  Laterality: Left;   CYSTOSCOPY WITH RETROGRADE PYELOGRAM, URETEROSCOPY AND STENT PLACEMENT Bilateral 02/17/2022   Procedure: CYSTOSCOPY WITH RETROGRADE PYELOGRAM, URETEROSCOPY AND STENT PLACEMENT;  Surgeon: Sherrilee Belvie CROME, MD;  Location: [facility];  Service: Urology;  Laterality: Bilateral;   CYSTOSCOPY WITH RETROGRADE PYELOGRAM, URETEROSCOPY AND STENT PLACEMENT Left 06/26/2022   Procedure: CYSTOSCOPY WITH RETROGRADE PYELOGRAM, URETEROSCOPY AND STENT PLACEMENT;  Surgeon: Sherrilee Belvie CROME, MD;  [facility]: [facility];  Service: Urology;  Laterality: Left;   HOLMIUM LASER APPLICATION Left 10/16/2020   Procedure: HOLMIUM LASER APPLICATION;  Surgeon: Sherrilee Belvie CROME, MD;  [facility]: [facility];  Service: Urology;  Laterality: Left;   HOLMIUM LASER APPLICATION Left 06/26/2022   Procedure: HOLMIUM LASER APPLICATION;  Surgeon: Sherrilee Belvie CROME, MD;  [facility];  Service: Urology;  Laterality: Left;   IR  NEPHROURETERAL CATH PLACE LEFT  08/05/2022   KIDNEY STONE SURGERY     kidney stones     LASER ABLATION CONDOLAMATA N/A 10/31/2020   Procedure: LASER ABLATION OF THE CERVIX;  Surgeon: Jayne Vonn DEL, MD;  Location: [facility];  Service: Gynecology;  Laterality: N/A;   NEPHROLITHOTOMY Left 08/07/2022   Procedure: NEPHROLITHOTOMY PERCUTANEOUS;  Surgeon: Sherrilee Belvie CROME, MD;  Location: [facility];  Service: Urology;  Laterality: Left;    Family History  Problem Relation Age of Onset   Lung cancer Mother    Cancer Father    Heart failure Father    Cancer Maternal Grandmother    Cancer Paternal Grandmother    Heart failure Paternal  Grandmother    Bipolar disorder Maternal Aunt    Colon cancer Neg Hx     Social History   Socioeconomic History   Marital status: Divorced    Spouse name: Not on file   Number of children: Not on file   Years of education: 14   Highest education level: Some college, no degree  Occupational History   Occupation: Disability    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, forensic  Tobacco Use   Smoking status: Every Day    Current packs/day: 0.50    Average packs/day: 0.5 packs/day for 22.2 years (11.1 ttl pk-yrs)    Types: Cigarettes    Start date: 02/08/2002   Smokeless tobacco: Never  Vaping Use   Vaping status: Never Used  Substance and Sexual Activity   Alcohol use: No   Drug use: No   Sexual activity: Not Currently    Birth control/protection: Post-menopausal  Other Topics Concern   Not on file  Social History Narrative   Divorced twice,1st lasted 5 years,2nd 4 years.On disabilty secondary to mental illness since 2004.Previously used to [employer].Lives alone,has 2 cats.cright handed    Are you right handed or left handed? right  Are you currently employed ? no   What is your current occupation?   Do you live at home alone?no   Who lives with you? dad   What type of home do you live in: 1 story or 2 story? one    Caffeine 2 cans of soda   Social Drivers of Health   Financial Resource Strain: Medium Risk (12/21/2023)   Overall Financial Resource Strain (CARDIA)    Difficulty of Paying Living Expenses: Somewhat hard  Food Insecurity: No Food Insecurity (12/21/2023)   Hunger Vital Sign    Worried About Running Out of Food in the Last Year: Never true    Ran Out of Food in the Last Year: Never true  Transportation Needs: No Transportation Needs (12/21/2023)   PRAPARE - Administrator, Civil Service (Medical): No    Lack of Transportation (Non-Medical): No  Physical Activity: Inactive (12/21/2023)   Exercise Vital Sign    Days of Exercise  per Week: 0 days    Minutes of Exercise per Session: 0 min  Stress: Stress Concern Present (12/21/2023)   [employer] of Occupational Health - Occupational Stress Questionnaire    Feeling of Stress: Very much  Social Connections: Socially Isolated (12/21/2023)   Social Connection and Isolation Panel    Frequency of Communication with Friends and Family: More than three times a week    Frequency of Social Gatherings with Friends and Family: Once a week    Attends Religious Services: Never    Database Administrator or Organizations: No    Attends Banker Meetings: Never    Marital Status: Divorced  Catering Manager Violence: Not At Risk (12/21/2023)   Humiliation, Afraid, Rape, and Kick questionnaire    Fear of Current or Ex-Partner: No    Emotionally Abused: No    Physically Abused: No    Sexually Abused: No    Outpatient Medications Prior to Visit  Medication Sig Dispense Refill   ALPRAZolam  (XANAX ) 1 MG tablet Take 1 mg by mouth 2 (two) times daily as needed for anxiety. 04/17/23 Patient reports xanax  changed from tid to bid     amLODipine  (NORVASC ) 5 MG tablet TAKE 1 TABLET BY MOUTH DAILY 30 tablet 5   ARIPiprazole  (ABILIFY ) 5 MG tablet Take 5 mg by mouth at bedtime.     atorvastatin  (LIPITOR) 20 MG tablet TAKE 1 TABLET BY MOUTH EVERY DAY 90 tablet 3   benztropine (COGENTIN) 1 MG tablet Take 1 mg by mouth 2 (two) times daily.     carvedilol  (COREG ) 25 MG tablet TAKE 1 TABLET BY MOUTH TWICE DAILY 180 tablet 2   Cholecalciferol  (VITAMIN D3) 125 MCG (5000 UT) TABS Take 10,000 Units by mouth daily.     doxepin  (SINEQUAN ) 75 MG capsule Take 225 mg by mouth at bedtime.      lamoTRIgine  (LAMICTAL ) 200 MG tablet Take 200 mg by mouth 2 (two) times daily.     losartan  (COZAAR ) 25 MG tablet Take 25 mg by mouth in the morning.     mirabegron  ER (MYRBETRIQ ) 50 MG TB24 tablet Take 1 tablet (50 mg total) by mouth daily. At bedtime 30 tablet 11   nitrofurantoin  (MACRODANTIN ) 50 MG  capsule Take 50 mg by mouth 4 (four) times daily.     potassium citrate  (UROCIT-K ) 10 MEQ (1080 MG) SR tablet Take 20 mEq by mouth in the morning and at bedtime.     QUEtiapine  (SEROQUEL  XR) 200 MG 24 hr tablet Take  400 mg by mouth at bedtime.     linaclotide  (LINZESS ) 145 MCG CAPS capsule Take 1 capsule (145 mcg total) by mouth daily before breakfast. 90 capsule 1   No facility-administered medications prior to visit.    Allergies  Allergen Reactions   Morphine And Codeine  Itching and Hives   Sulfa Antibiotics Swelling   Tape Itching and Other (See Comments)    Surgical     ROS Review of Systems  Constitutional:  Negative for chills and fever.  HENT:  Negative for congestion, sinus pressure, sinus pain and sore throat.   Eyes:  Negative for pain and discharge.  Respiratory:  Negative for cough and shortness of breath.   Cardiovascular:  Negative for chest pain and palpitations.  Gastrointestinal:  Negative for abdominal pain, diarrhea, nausea and vomiting.  Endocrine: Negative for polydipsia and polyuria.  Genitourinary:  Negative for dysuria and hematuria.  Musculoskeletal:  Positive for arthralgias. Negative for neck pain and neck stiffness.  Skin:  Negative for rash.  Neurological:  Positive for dizziness. Negative for weakness.  Psychiatric/Behavioral:  Positive for confusion. Negative for agitation and behavioral problems. The patient is nervous/anxious.       Objective:    Physical Exam Vitals reviewed.  Constitutional:      General: She is not in acute distress.    Appearance: She is not diaphoretic.  HENT:     Head: Normocephalic and atraumatic.     Nose: Nose normal.     Mouth/Throat:     Mouth: Mucous membranes are moist.  Eyes:     General: No scleral icterus.    Extraocular Movements: Extraocular movements intact.  Cardiovascular:     Rate and Rhythm: Normal rate and regular rhythm.     Heart sounds: Normal heart sounds. No murmur heard. Pulmonary:      Breath sounds: Normal breath sounds. No wheezing or rales.  Musculoskeletal:     Right shoulder: No swelling.     Left shoulder: No swelling.     Cervical back: Neck supple. No tenderness.     Right lower leg: No edema.     Left lower leg: No edema.     Comments: Painful ROM over bilateral shoulders  Skin:    General: Skin is warm.     Findings: No rash.  Neurological:     General: No focal deficit present.     Mental Status: She is alert and oriented to person, place, and time.     Sensory: No sensory deficit.     Motor: Weakness (B/l LE - 4/5) present.  Psychiatric:        Mood and Affect: Mood is depressed.        Behavior: Behavior is cooperative.     BP 130/65   Pulse 88   Ht 5' 3 (1.6 m)   Wt 222 lb 3.2 oz (100.8 kg)   SpO2 95%   BMI 39.36 kg/m  Wt Readings from Last 3 Encounters:  04/25/24 222 lb 3.2 oz (100.8 kg)  02/29/24 230 lb (104.3 kg)  02/19/24 230 lb (104.3 kg)    Lab Results  Component Value Date   TSH 2.660 12/24/2023   Lab Results  Component Value Date   WBC 6.2 04/25/2024   HGB 14.8 04/25/2024   HCT 45.6 04/25/2024   MCV 90 04/25/2024   PLT 294 04/25/2024   Lab Results  Component Value Date   NA 139 04/25/2024   K 4.4 04/25/2024   CO2 25 04/25/2024  GLUCOSE 83 04/25/2024   BUN 15 04/25/2024   CREATININE 1.51 (H) 04/25/2024   BILITOT 0.5 04/25/2024   ALKPHOS 220 (H) 04/25/2024   AST 21 04/25/2024   ALT 30 04/25/2024   PROT 7.4 04/25/2024   ALBUMIN 4.5 04/25/2024   CALCIUM  9.7 04/25/2024   ANIONGAP 12 08/11/2022   EGFR 41 (L) 04/25/2024   Lab Results  Component Value Date   CHOL 158 12/24/2023   Lab Results  Component Value Date   HDL 57 12/24/2023   Lab Results  Component Value Date   LDLCALC 72 12/24/2023   Lab Results  Component Value Date   TRIG 176 (H) 12/24/2023   Lab Results  Component Value Date   CHOLHDL 2.8 12/24/2023   Lab Results  Component Value Date   HGBA1C 5.3 12/24/2023      Assessment &  Plan:   Problem List Items Addressed This Visit       Cardiovascular and Mediastinum   Essential hypertension - Primary   BP Readings from Last 1 Encounters:  04/25/24 130/65   Well-controlled with amlodipine  5 mg QD, Losartan  25 mg QD and Coreg  25 mg BID Followed by Nephrology Episodes of dizziness could be due to dehydration and orthostatic hypotension - improved since discontinuing chlorthalidone  and clonidine , but still needs to improve hydration Counseled for compliance with the medications Advised DASH diet and moderate exercise/walking, at least 150 mins/week        Digestive   IBS (irritable bowel syndrome)   Has chronic constipation Has been evaluated by GI Needs to start taking Linzess  Needs to increase fluid intake        Nervous and Auditory   Tardive dyskinesia   Likely due to antipsychotics-on Abilify  and Seroquel  She is already on Cogentin - symptoms improved now        Genitourinary   CKD (chronic kidney disease) stage 3, GFR 30-59 ml/min   Likely due to uncontrolled HTN in the past Followed by Nephrology - last visit note reviewed On Losartan  DCed Chlorthalidone  due to concern for orthostatic hypotension/ dehydration Avoid nephrotoxic agents Check CMP and CBC      Relevant Orders   CMP14+EGFR (Completed)   CBC (Completed)     Other   Bipolar disorder in partial remission   Followed by psychiatry On Abilify , doxepin , Lamictal  and Seroquel  Takes Xanax  as needed for anxiet Her clumsiness/drowsiness is likely due to her psychiatric medications      Other Visit Diagnoses       Encounter for immunization       Relevant Orders   Flu vaccine trivalent PF, 6mos and older(Flulaval ,Afluria,Fluarix,Fluzone) (Completed)           No orders of the defined types were placed in this encounter.   Follow-up: Return in about 6 months (around 10/23/2024) for HTN.    Suzzane MARLA Blanch, MD

## 2024-04-25 NOTE — Assessment & Plan Note (Signed)
 Likely due to uncontrolled HTN in the past Followed by Nephrology - last visit note reviewed On Losartan  DCed Chlorthalidone  due to concern for orthostatic hypotension/ dehydration Avoid nephrotoxic agents Check CMP and CBC

## 2024-04-25 NOTE — Patient Instructions (Addendum)
 Please schedule medicare annual wellness.  Please continue to take medications as prescribed.  Please continue to follow low salt diet and perform moderate exercise/walking as tolerated.  Please maintain at least 64 ounces of fluid intake in a day.

## 2024-04-25 NOTE — Assessment & Plan Note (Signed)
 BP Readings from Last 1 Encounters:  04/25/24 130/65   Well-controlled with amlodipine  5 mg QD, Losartan  25 mg QD and Coreg  25 mg BID Followed by Nephrology Episodes of dizziness could be due to dehydration and orthostatic hypotension - improved since discontinuing chlorthalidone  in the last visit, had previously stopped clonidine  Needs to improve hydration Counseled for compliance with the medications Advised DASH diet and moderate exercise/walking, at least 150 mins/week

## 2024-04-26 ENCOUNTER — Other Ambulatory Visit: Payer: Self-pay | Admitting: Internal Medicine

## 2024-04-26 ENCOUNTER — Ambulatory Visit: Payer: Self-pay | Admitting: Internal Medicine

## 2024-04-26 DIAGNOSIS — R748 Abnormal levels of other serum enzymes: Secondary | ICD-10-CM

## 2024-04-26 LAB — CMP14+EGFR
ALT: 30 IU/L (ref 0–32)
AST: 21 IU/L (ref 0–40)
Albumin: 4.5 g/dL (ref 3.8–4.9)
Alkaline Phosphatase: 220 IU/L — ABNORMAL HIGH (ref 49–135)
BUN/Creatinine Ratio: 10 (ref 9–23)
BUN: 15 mg/dL (ref 6–24)
Bilirubin Total: 0.5 mg/dL (ref 0.0–1.2)
CO2: 25 mmol/L (ref 20–29)
Calcium: 9.7 mg/dL (ref 8.7–10.2)
Chloride: 100 mmol/L (ref 96–106)
Creatinine, Ser: 1.51 mg/dL — ABNORMAL HIGH (ref 0.57–1.00)
Globulin, Total: 2.9 g/dL (ref 1.5–4.5)
Glucose: 83 mg/dL (ref 70–99)
Potassium: 4.4 mmol/L (ref 3.5–5.2)
Sodium: 139 mmol/L (ref 134–144)
Total Protein: 7.4 g/dL (ref 6.0–8.5)
eGFR: 41 mL/min/1.73 — ABNORMAL LOW (ref >59)

## 2024-04-26 LAB — CBC
Hematocrit: 45.6 % (ref 34.0–46.6)
Hemoglobin: 14.8 g/dL (ref 11.1–15.9)
MCH: 29.1 pg (ref 26.6–33.0)
MCHC: 32.5 g/dL (ref 31.5–35.7)
MCV: 90 fL (ref 79–97)
Platelets: 294 x10E3/uL (ref 150–450)
RBC: 5.09 x10E6/uL (ref 3.77–5.28)
RDW: 15.1 % (ref 11.7–15.4)
WBC: 6.2 x10E3/uL (ref 3.4–10.8)

## 2024-04-26 NOTE — Assessment & Plan Note (Signed)
 Likely due to antipsychotics-on Abilify  and Seroquel  She is already on Cogentin - symptoms improved now

## 2024-04-26 NOTE — Assessment & Plan Note (Signed)
 Has chronic constipation Has been evaluated by GI Needs to start taking Linzess  Needs to increase fluid intake

## 2024-04-26 NOTE — Assessment & Plan Note (Signed)
 Followed by psychiatry On Abilify , doxepin , Lamictal  and Seroquel  Takes Xanax  as needed for anxiet Her clumsiness/drowsiness is likely due to her psychiatric medications

## 2024-04-30 DIAGNOSIS — N200 Calculus of kidney: Secondary | ICD-10-CM | POA: Diagnosis not present

## 2024-04-30 DIAGNOSIS — I129 Hypertensive chronic kidney disease with stage 1 through stage 4 chronic kidney disease, or unspecified chronic kidney disease: Secondary | ICD-10-CM | POA: Diagnosis not present

## 2024-04-30 DIAGNOSIS — R809 Proteinuria, unspecified: Secondary | ICD-10-CM | POA: Diagnosis not present

## 2024-04-30 DIAGNOSIS — N1832 Chronic kidney disease, stage 3b: Secondary | ICD-10-CM | POA: Diagnosis not present

## 2024-05-06 ENCOUNTER — Encounter: Payer: Medicare HMO | Admitting: Psychology

## 2024-05-09 ENCOUNTER — Other Ambulatory Visit: Payer: Self-pay | Admitting: Internal Medicine

## 2024-05-09 ENCOUNTER — Encounter: Payer: Self-pay | Admitting: Obstetrics & Gynecology

## 2024-05-09 ENCOUNTER — Telehealth: Admitting: Obstetrics & Gynecology

## 2024-05-09 DIAGNOSIS — N3946 Mixed incontinence: Secondary | ICD-10-CM

## 2024-05-09 DIAGNOSIS — I10 Essential (primary) hypertension: Secondary | ICD-10-CM

## 2024-05-09 NOTE — Progress Notes (Signed)
 Patient's internet would not support a video visit so we did a audio only My Chart Visit Patient was at home I am in my office  Total time 10 minutes  Follow up appointment for results: myrbetriq   Chief Complaint  Patient presents with   Follow-up    Myrbetriq     There were no vitals taken for this visit.  Patient is currently getting up 1 time per night for the most part Her urine loss has diminished involume significantly, she still has stress symptoms but she is overall happy with how the medication is working and wants to continue  MEDS ordered this encounter: No orders of the defined types were placed in this encounter.   Orders for this encounter: No orders of the defined types were placed in this encounter.   Impression + Management Plan   ICD-10-CM   1. Mixed incontinence: trial of myrbetriq  50 mg: Good response, on the urge side, wants to continue  N39.46       Follow Up: Return if symptoms worsen or fail to improve, for 12/2026 for her yearly or as needed.     All questions were answered.  Past Medical History:  Diagnosis Date   Acute cystitis without hematuria 08/01/2022   Anxiety    Bipolar disorder in partial remission 09/03/2021   Chronic pain of both knees 09/24/2023   Chronic pain of both shoulders 09/24/2023   CKD (chronic kidney disease) stage 3, GFR 30-59 ml/min 03/15/2019   Depression    Diarrhea 05/04/2017   Enlarged uterus 08/06/2020   Essential hypertension 03/15/2019   History of kidney stones    IBS (irritable bowel syndrome) 01/04/2024   LGSIL on Pap smear of cervix 08/17/2020   Needs colpo per [facility] immediate risk is 4.3 for CIN 3+risk    Mixed hyperlipidemia 09/05/2021   Morbid obesity    Recurrent falls 04/04/2022   Recurrent nephrolithiasis 09/03/2021   Subclinical hypothyroidism 03/15/2019   Tardive dyskinesia 11/21/2022   Vasovagal syncope 12/30/2021   Vitamin D  deficiency 03/15/2019    Past Surgical History:   Procedure Laterality Date   BIOPSY  06/05/2017   Procedure: BIOPSY;  Surgeon: Golda Claudis PENNER, MD;  [facility];  Service: Endoscopy;;  colon   CHOLECYSTECTOMY     COLONOSCOPY N/A 06/05/2017   Procedure: COLONOSCOPY;  Surgeon: Golda Claudis PENNER, MD;  [facility];  Service: Endoscopy;  Laterality: N/A;  8:30   COLONOSCOPY N/A 02/19/2024   Procedure: COLONOSCOPY;  Surgeon: Eartha Angelia Sieving, MD;  [facility];  Service: Gastroenterology;  Laterality: N/A;  9:00am, ASA 1-2   CYSTOSCOPY WITH RETROGRADE PYELOGRAM, URETEROSCOPY AND STENT PLACEMENT Left 10/04/2020   Procedure: CYSTOSCOPY WITH LEFT RETROGRADE PYELOGRAM, DIAGNOSTIC LEFT URETEROSCOPY AND LEFT URETEREAL STENT PLACEMENT;  Surgeon: Sherrilee Belvie CROME, MD;  Location: [facility];  Service: Urology;  Laterality: Left;   CYSTOSCOPY WITH RETROGRADE PYELOGRAM, URETEROSCOPY AND STENT PLACEMENT Left 10/16/2020   Procedure: CYSTOSCOPY WITH LEFT RETROGRADE PYELOGRAM, LEFT URETEROSCOPY  WITH LASER AND LEFT URETERAL STENT EXCHANGE;  Surgeon: Sherrilee Belvie CROME, MD;  Location: [facility];  Service: Urology;  Laterality: Left;   CYSTOSCOPY WITH RETROGRADE PYELOGRAM, URETEROSCOPY AND STENT PLACEMENT Bilateral 02/17/2022   Procedure: CYSTOSCOPY WITH RETROGRADE PYELOGRAM, URETEROSCOPY AND STENT PLACEMENT;  Surgeon: Sherrilee Belvie CROME, MD;  [facility];  Service: Urology;  Laterality: Bilateral;   CYSTOSCOPY WITH RETROGRADE PYELOGRAM, URETEROSCOPY AND STENT PLACEMENT Left 06/26/2022   Procedure: CYSTOSCOPY WITH RETROGRADE PYELOGRAM, URETEROSCOPY AND STENT PLACEMENT;  Surgeon: Sherrilee Belvie CROME, MD;  [facility];  Service: Urology;  Laterality: Left;   HOLMIUM LASER APPLICATION Left 10/16/2020   Procedure: HOLMIUM LASER APPLICATION;  Surgeon: Sherrilee Belvie CROME, MD;  [facility];  Service: Urology;  Laterality: Left;   HOLMIUM LASER APPLICATION Left 06/26/2022   Procedure: HOLMIUM LASER APPLICATION;  Surgeon:  Sherrilee Belvie CROME, MD;  Location[facility];  Service: Urology;  Laterality: Left;   IR  NEPHROURETERAL CATH PLACE LEFT  08/05/2022   KIDNEY STONE SURGERY     kidney stones     LASER ABLATION CONDOLAMATA N/A 10/31/2020   Procedure: LASER ABLATION OF THE CERVIX;  Surgeon: Jayne Vonn DEL, MD;  Location[facility];  Service: Gynecology;  Laterality: N/A;   NEPHROLITHOTOMY Left 08/07/2022   Procedure: NEPHROLITHOTOMY PERCUTANEOUS;  Surgeon: Sherrilee Belvie CROME, MD;  Location: [facility];  Service: Urology;  Laterality: Left;    OB History     Gravida  1   Para      Term      Preterm      AB  1   Living  0      SAB      IAB      Ectopic      Multiple      Live Births              Allergies  Allergen Reactions   Morphine And Codeine  Itching and Hives   Sulfa Antibiotics Swelling   Tape Itching and Other (See Comments)    Surgical     Social History   Socioeconomic History   Marital status: Divorced    Spouse name: Not on file   Number of children: Not on file   Years of education: 14   Highest education level: Some college, no degree  Occupational History   Occupation: Disability    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, environmental capacity  Tobacco Use   Smoking status: Every Day    Current packs/day: 0.50    Average packs/day: 0.5 packs/day for 22.2 years (11.1 ttl pk-yrs)    Types: Cigarettes    Start date: 02/08/2002   Smokeless tobacco: Never  Vaping Use   Vaping status: Never Used  Substance and Sexual Activity   Alcohol use: No   Drug use: No   Sexual activity: Not Currently    Birth control/protection: Post-menopausal  Other Topics Concern   Not on file  Social History Narrative   Divorced twice,1st lasted 5 years,2nd 4 years.On disabilty secondary to mental illness since 2004.Previously used to [employer].Lives alone,has 2 cats.cright handed    Are you right handed or left handed? right   Are you currently employed ? no   What is your  current occupation?   Do you live at home alone?no   Who lives with you? dad   What type of home do you live in: 1 story or 2 story? one    Caffeine 2 cans of soda   Social Drivers of Health   Financial Resource Strain: Medium Risk (12/21/2023)   Overall Financial Resource Strain (CARDIA)    Difficulty of Paying Living Expenses: Somewhat hard  Food Insecurity: No Food Insecurity (12/21/2023)   Hunger Vital Sign    Worried About Running Out of Food in the Last Year: Never true    Ran Out of Food in the Last Year: Never true  Transportation Needs: No Transportation Needs (12/21/2023)   PRAPARE - Transportation  Lack of Transportation (Medical): No    Lack of Transportation (Non-Medical): No  Physical Activity: Inactive (12/21/2023)   Exercise Vital Sign    Days of Exercise per Week: 0 days    Minutes of Exercise per Session: 0 min  Stress: Stress Concern Present (12/21/2023)   [employer] of Occupational Health - Occupational Stress Questionnaire    Feeling of Stress: Very much  Social Connections: Socially Isolated (12/21/2023)   Social Connection and Isolation Panel    Frequency of Communication with Friends and Family: More than three times a week    Frequency of Social Gatherings with Friends and Family: Once a week    Attends Religious Services: Never    Database Administrator or Organizations: No    Attends Engineer, Structural: Never    Marital Status: Divorced    Family History  Problem Relation Age of Onset   Lung cancer Mother    Cancer Mother    Cancer Father    Heart failure Father    Alcohol abuse Father    Cancer Maternal Grandmother    Cancer Paternal Grandmother    Heart failure Paternal Grandmother    Bipolar disorder Maternal Aunt    Colon cancer Neg Hx

## 2024-06-11 ENCOUNTER — Other Ambulatory Visit: Payer: Self-pay | Admitting: Internal Medicine

## 2024-06-11 DIAGNOSIS — E782 Mixed hyperlipidemia: Secondary | ICD-10-CM

## 2024-06-30 ENCOUNTER — Ambulatory Visit (INDEPENDENT_AMBULATORY_CARE_PROVIDER_SITE_OTHER): Admitting: Gastroenterology

## 2024-07-07 ENCOUNTER — Other Ambulatory Visit: Payer: Self-pay | Admitting: Internal Medicine

## 2024-07-07 DIAGNOSIS — I10 Essential (primary) hypertension: Secondary | ICD-10-CM

## 2024-07-11 ENCOUNTER — Ambulatory Visit: Admitting: Neurology

## 2024-08-02 ENCOUNTER — Ambulatory Visit (INDEPENDENT_AMBULATORY_CARE_PROVIDER_SITE_OTHER): Admitting: Gastroenterology
